# Patient Record
Sex: Female | Born: 1935
Health system: Southern US, Community
[De-identification: ages and names within clinical notes are randomized; demographics above are authoritative.]

## PROBLEM LIST (undated history)

## (undated) DIAGNOSIS — I10 Essential (primary) hypertension: Secondary | ICD-10-CM

## (undated) DIAGNOSIS — J9811 Atelectasis: Secondary | ICD-10-CM

## (undated) DIAGNOSIS — I5020 Unspecified systolic (congestive) heart failure: Secondary | ICD-10-CM

## (undated) DIAGNOSIS — Z923 Personal history of irradiation: Secondary | ICD-10-CM

## (undated) DIAGNOSIS — C50919 Malignant neoplasm of unspecified site of unspecified female breast: Secondary | ICD-10-CM

## (undated) DIAGNOSIS — E43 Unspecified severe protein-calorie malnutrition: Secondary | ICD-10-CM

## (undated) DIAGNOSIS — K449 Diaphragmatic hernia without obstruction or gangrene: Secondary | ICD-10-CM

## (undated) DIAGNOSIS — N189 Chronic kidney disease, unspecified: Secondary | ICD-10-CM

## (undated) DIAGNOSIS — K5790 Diverticulosis of intestine, part unspecified, without perforation or abscess without bleeding: Secondary | ICD-10-CM

## (undated) DIAGNOSIS — C9000 Multiple myeloma not having achieved remission: Secondary | ICD-10-CM

## (undated) DIAGNOSIS — N63 Unspecified lump in unspecified breast: Secondary | ICD-10-CM

## (undated) DIAGNOSIS — M1990 Unspecified osteoarthritis, unspecified site: Secondary | ICD-10-CM

## (undated) DIAGNOSIS — E785 Hyperlipidemia, unspecified: Secondary | ICD-10-CM

## (undated) HISTORY — DX: Unspecified lump in unspecified breast: N63.0

## (undated) HISTORY — DX: Diverticulosis of intestine, part unspecified, without perforation or abscess without bleeding: K57.90

## (undated) HISTORY — PX: TONSILLECTOMY: SUR1361

## (undated) HISTORY — DX: Malignant neoplasm of unspecified site of unspecified female breast: C50.919

## (undated) HISTORY — DX: Multiple myeloma not having achieved remission: C90.00

## (undated) HISTORY — DX: Hyperlipidemia, unspecified: E78.5

## (undated) HISTORY — DX: Essential (primary) hypertension: I10

## (undated) HISTORY — DX: Diaphragmatic hernia without obstruction or gangrene: K44.9

## (undated) HISTORY — DX: Chronic kidney disease, unspecified: N18.9

---

## 2009-12-11 ENCOUNTER — Emergency Department (HOSPITAL_COMMUNITY): Admission: EM | Admit: 2009-12-11 | Discharge: 2009-12-11 | Payer: Self-pay | Admitting: Emergency Medicine

## 2010-04-01 ENCOUNTER — Encounter: Payer: Self-pay | Admitting: Nurse Practitioner

## 2010-04-01 ENCOUNTER — Encounter: Admission: RE | Admit: 2010-04-01 | Discharge: 2010-04-01 | Payer: Self-pay | Admitting: Internal Medicine

## 2010-04-03 ENCOUNTER — Encounter: Payer: Self-pay | Admitting: Nurse Practitioner

## 2010-04-03 ENCOUNTER — Encounter (INDEPENDENT_AMBULATORY_CARE_PROVIDER_SITE_OTHER): Payer: Self-pay | Admitting: *Deleted

## 2010-04-03 ENCOUNTER — Encounter: Payer: Self-pay | Admitting: Physician Assistant

## 2010-04-03 ENCOUNTER — Telehealth: Payer: Self-pay | Admitting: Internal Medicine

## 2010-04-04 ENCOUNTER — Ambulatory Visit: Payer: Self-pay | Admitting: Gastroenterology

## 2010-04-04 DIAGNOSIS — E871 Hypo-osmolality and hyponatremia: Secondary | ICD-10-CM

## 2010-04-04 DIAGNOSIS — K5900 Constipation, unspecified: Secondary | ICD-10-CM | POA: Insufficient documentation

## 2010-04-04 DIAGNOSIS — R933 Abnormal findings on diagnostic imaging of other parts of digestive tract: Secondary | ICD-10-CM

## 2010-04-04 DIAGNOSIS — R1031 Right lower quadrant pain: Secondary | ICD-10-CM

## 2010-04-09 ENCOUNTER — Telehealth: Payer: Self-pay | Admitting: Nurse Practitioner

## 2010-05-15 ENCOUNTER — Telehealth (INDEPENDENT_AMBULATORY_CARE_PROVIDER_SITE_OTHER): Payer: Self-pay | Admitting: *Deleted

## 2010-11-18 NOTE — Letter (Signed)
Summary: [facility]  [facility]   Imported By: Sherian Rein 04/11/2010 12:01:19  _____________________________________________________________________  External Attachment:    Type:   Image     Comment:   External Document

## 2010-11-18 NOTE — Progress Notes (Signed)
Summary: New Pt/RUQ Abd Pain  Phone Note From Other Clinic   Caller: Lupita Leash @ Dr Perrin Maltese [phone] Call For: Dr Juanda Chance (Doc of the Day) Reason for Call: Schedule Patient Appt Summary of Call: Would like pt seen today or tomorrow instead of next available. RLQ Abd Pain Initial call taken by: Leanor Kail [facility],  April 03, 2010 1:43 PM  Follow-up for Phone Call        Per [name] pt scheduled to see Mike Gip on 04/04/10 at 1:30pm. Records to be faxed by Marcelino Duster at Dr. Ernestene Mention office and Marcelino Duster will notify pt of appt and cancellation policy. Follow-up by: Christie Nottingham CMA [facility]),  April 03, 2010 2:08 PM

## 2010-11-18 NOTE — Letter (Signed)
Summary: [facility]  [facility]   Imported By: Sherian Rein 04/11/2010 12:02:12  _____________________________________________________________________  External Attachment:    Type:   Image     Comment:   External Document

## 2010-11-18 NOTE — Assessment & Plan Note (Signed)
Summary: Abd pain/ per Paula/all   History of Present Illness Visit Type: consult Primary GI MD: Rob Bunting MD Primary Provider: Robert Bellow, MD Requesting Provider: Robert Bellow, MD Chief Complaint: severe lower abdominal pain, pt is also constipated History of Present Illness:   Jacqueline Rivas is a pleasant 77 year old female referred by [facility] (Dr. Perrin Maltese) for a one week history of nausea, RLQ pain / right lower back pain, and constipation.  She is hemoccult positive and has an abnormal CTscan abd / pelvis. Pain almost constant, worse when lying flat. Has to strain to defecate and having a small amount of rectal bleeding when she does so.  No fevers. Four years ago had same exact thing happen, states MRI was normal, given a laxative and problems resolved. In between these two episodes patient has felt fine. Her BMs usually normal. She has never had a colonoscopy. Doesn't like to take medications. Miralax recommended for acute constipation but she hasn't started it.   Labs / CTscan from Dr. Ernestene Mention office were reviewed. CBC normal. CMET pertinent only for Na of 126.  ESR 63. Given Cipro and Flagyl by Dr. Perrin Maltese - hasn't filled prescriptions yet.     GI Review of Systems     Location of  Abdominal pain: RLQ.    Denies abdominal pain, acid reflux, belching, bloating, chest pain, dysphagia with liquids, dysphagia with solids, heartburn, loss of appetite, nausea, vomiting, vomiting blood, weight loss, and  weight gain.      Reports change in bowel habits, constipation, and  rectal pain.     Denies anal fissure, black tarry stools, diarrhea, diverticulosis, fecal incontinence, heme positive stool, hemorrhoids, irritable bowel syndrome, jaundice, light color stool, liver problems, and  rectal bleeding. Preventive Screening-Counseling & Management  Alcohol-Tobacco     Smoking Status: never      Drug Use:  no.      Current Medications (verified): 1)  Miralax  Powd  (Polyethylene Glycol 3350) .... Take One Dose (17 Grams) Once Daily As Needed 2)  Senokot 8.6 Mg Tabs (Sennosides) .... As Needed 3)  Vitamin E 400 Unit Caps (Vitamin E) .... Take 1 Tablet By Mouth Once A Day 4)  Magnesium 300 Mg Caps (Magnesium) .... Take 1 Capsule By Mouth Once A Day 5)  Potassium 75 Mg Tabs (Potassium) .... Take 1 Tablet By Mouth Once A Day 6)  Cod Liver Oil 5000-500 Unit/55ml Oil (Cod Liver Oil) .... Take Dailly 7)  B-12 Dots 500 Mcg Subl (Cyanocobalamin) .... 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  Alcohol Use - yes 2-3/day Daily Caffeine Use 3/day Illicit Drug Use - no Smoking Status:  never Drug Use:  no  Review of Systems  The patient denies allergy/sinus, anemia, anxiety-new, arthritis/joint pain, back pain, blood in urine, breast changes/lumps, change in vision, confusion, cough, coughing up blood, depression-new, fainting, fatigue, fever, headaches-new, hearing problems, heart murmur, heart rhythm changes, itching, menstrual pain, muscle pains/cramps, night sweats, nosebleeds, pregnancy symptoms, shortness of breath, skin rash, sleeping problems, sore throat, swelling of feet/legs, swollen lymph glands, thirst - excessive , urination - excessive , urination changes/pain, urine leakage, vision changes, and voice  change.    Vital Signs:  Patient profile:   77 year old female Height:      66 inches Weight:      154 pounds BMI:     24.95 Pulse rate:   76 / minute Pulse rhythm:   regular BP sitting:   126 / 68  (left arm) Cuff size:   regular  Vitals Entered By: Jacqueline Rivas) (April 04, 2010  1:58 PM)  Physical Exam  General:  Well developed, well nourished, no acute distress. Head:  Normocephalic and atraumatic. Eyes:  Conjunctiva pink, no icterus.  Mouth:  No oral lesions. Tongue moist.  Neck:  no obvious masses  Lungs:  Clear throughout to auscultation. Heart:  Regular rate and rhythm; no murmurs, rubs,  or bruits. Abdomen:  Abdomen soft, nondistended, mild RLQ tenderness. No obvious masses or hepatomegaly.Normal bowel sounds.  Msk:  Symmetrical with no gross deformities. Normal posture. Extremities:  No palmar erythema, trace RLE edema.  Neurologic:  Alert and  oriented x4;  grossly normal neurologically. Skin:  Intact without significant lesions or rashes. Cervical Nodes:  No significant cervical adenopathy. Psych:  Alert and cooperative. Normal mood and affect.   Impression & Recommendations:  Problem # 1:  ABDOMINAL PAIN-RLQ (ICD-789.03) Assessment New Acute nausea, constipation, and RLQ pain with some radiation to right lower back in setting of CTscan revealing mild inflammation associated with the fat adjacent to the cecum and terminal ileum. Rule out early diverticulitis, ichemic colitis, Crohn's. CBC normal. ESR 63. CMET okay except sodium of 126.   For further evaluation in acute bowel habit change, RLQ pain, hemoccult positive stools and abnormal CTscan, patient will be scheduled for colonoscopy. I have advised her to take the Cipro and Flagyl prescribed by [facility]. The patient will be scheduled for a colonoscopy with biopsies/polypectomy (if indicated).  The risks and benefits of the procedure, as well as alternatives were discussed with the patient and she agrees to proceed.    Problem # 2:  NONSPECIFIC ABN FINDING RAD & OTH EXAM GI TRACT (ICD-793.4) Assessment: New See above.  Problem # 3:  CONSTIPATION (ICD-564.00) Assessment: New It has been nine days since her last bowel movement. Will purge bowels with Miralax.   Problem # 4:  HYPONATREMIA  (ICD-276.1) Na+ 126 on 04/01/10 labs. Her chloride was low at 89.  Etiology? Not on any medications.  Patient Instructions: 1)  Get Miralax at [facility].  One dose is 17 grams. 2)  Mix the powder with gatorade, 6-8 doses.  Drink a glass of this every 15-20 min.   3)  Take the medication Dr. Perrin Maltese prescribed, Cipro, Flagyl and the Vicodin. 4)  Drink clear liquids for the next 3 days and slowly advance to a regular diet as tolerated. 5)  Call us back as soon as you know when you can scheduled the Colonoscopy.   6)  Copy sent to : Robert Bellow, MD 7)  The medication list was reviewed and reconciled.  All changed / newly prescribed medications were explained.  A complete medication list was provided to the patient / caregiver.

## 2010-11-18 NOTE — Progress Notes (Signed)
Summary: Call pt to scheduled Colonoscopy  Phone Note Outgoing Call   Call placed by: Joselyn Glassman,  May 15, 2010 10:15 AM Call placed to: Patient Summary of Call: I left a message on the pt's home ans machine.  I reminded her that we suggested she schedule a Colonoscopy when she saw [name] NP in June , 2011.  She has not called [place] back to schedule.  I reminded her she would need to have someone with her for the entire time of the procedure.  I asked her to please call me. Initial call taken by: Joselyn Glassman,  May 15, 2010 10:18 AM

## 2010-11-18 NOTE — Letter (Signed)
Summary: New Patient letter  [facility]  [address] [place], [place] [address]   Phone: [phone]  Fax: [phone]       04/03/2010 MRN: [id number]  Jacqueline Rivas [address] [place], [place]  [address]  Dear Jacqueline Rivas,  Welcome to the Gastroenterology Division at [facility].    You are scheduled to see Dr.  Juanda Chance on 04-08-10 at 9:15am on the 3rd floor at [facility], [address]  We ask that you try to arrive at our office 15 minutes prior to your appointment time to allow for check-in.  We would like you to complete the enclosed self-administered evaluation form prior to your visit and bring it with you on the day of your appointment.  We will review it with you.  Also, please bring a complete list of all your medications or, if you prefer, bring the medication bottles and we will list them.  Please bring your insurance card so that we may make a copy of it.  If your insurance requires a referral to see a specialist, please bring your referral form from your primary care physician.  Co-payments are due at the time of your visit and may be paid by cash, check or credit card.     Your office visit will consist of a consult with your physician (includes a physical exam), any laboratory testing he/she may order, scheduling of any necessary diagnostic testing (e.g. x-ray, ultrasound, CT-scan), and scheduling of a procedure (e.g. Endoscopy, Colonoscopy) if required.  Please allow enough time on your schedule to allow for any/all of these possibilities.    If you cannot keep your appointment, please call [phone] to cancel or reschedule prior to your appointment date.  This allows [place] the opportunity to schedule an appointment for another patient in need of care.  If you do not cancel or reschedule by 5 p.m. the business day prior to your appointment date, you will be charged a $50.00 late cancellation/no-show fee.    Thank you for choosing  [facility] for your medical needs.  We appreciate the opportunity to care for you.  Please visit [place] at our website  to learn more about our practice.                     Sincerely,                                                             The Gastroenterology Division

## 2010-11-18 NOTE — Progress Notes (Signed)
Summary: Need to schedule Colonoscopy  Phone Note Outgoing Call   Call placed by: Joselyn Glassman,  April 09, 2010 8:58 AM Call placed to: Patient Summary of Call: Called pt to ask how she was doing. I wanted to know if she drank the MIralax and Gatorade to help purge her bowels.  I also asked if she spoke to her son about briniging her for theprocedure and reminded her she needs to scheduled ther Colonoscopy. I urged her to call me and advised that [name] NP , the extender she saw was asking me if I scheduled the Colonoscopy yet. Initial call taken by: Joselyn Glassman,  April 09, 2010 9:00 AM  Follow-up for Phone Call        LM again for the pt regarding an update as to whether she used the Miralax and Gatorade to purge her bowels.  Also wanted to scheduled the Colonoscopy with her.  I believe she did say she was going out of town this week. Follow-up by: Joselyn Glassman,  April 10, 2010 9:31 AM  Additional Follow-up for Phone Call Additional follow up Details #1::        The pt called and said she may schedule in Aug for the Colonoscopy but has to check her son's schedule.  She did 5 doses of Miralax with Gatorade on Tues PM and Wed she had 3-4 BM's with lots of liquid stool.  She has taken it since in the AM and PM. She is having loose, liquid stools 2 times daily with lots of gas.  No urgency but lots of gas.  SHe is still having some abd discomfort.  She gave me a list of things she is eating right now.  I will discuss with [name] NP today and call the pt back later today. Additional Follow-up by: Joselyn Glassman,  April 10, 2010 10:51 AM    Additional Follow-up for Phone Call Additional follow up Details #2::    Spoke to pt and advised her since she did not have the antibiotic prescriptions filled she should get them. They should help the pain and inflammation.  [name] said the items she has been eating are fine. I told her to call me tomorrow and that I will be on  vacation and she should call us if she needs [place] next week. Follow-up by: Joselyn Glassman,  April 10, 2010 4:02 PM

## 2012-11-11 ENCOUNTER — Other Ambulatory Visit: Payer: Self-pay | Admitting: Internal Medicine

## 2012-11-11 ENCOUNTER — Ambulatory Visit
Admission: RE | Admit: 2012-11-11 | Discharge: 2012-11-11 | Disposition: A | Discharge status: Home / Self Care | Payer: Medicare Other | Source: Ambulatory Visit | Attending: Internal Medicine | Admitting: Internal Medicine

## 2012-11-11 ENCOUNTER — Ambulatory Visit (INDEPENDENT_AMBULATORY_CARE_PROVIDER_SITE_OTHER): Payer: Medicare Other | Admitting: Internal Medicine

## 2012-11-11 ENCOUNTER — Ambulatory Visit: Payer: Medicare Other

## 2012-11-11 VITALS — BP 162/79 | HR 94 | Temp 97.8°F | Resp 17 | Ht 65.0 in | Wt 155.0 lb

## 2012-11-11 DIAGNOSIS — M5137 Other intervertebral disc degeneration, lumbosacral region: Secondary | ICD-10-CM

## 2012-11-11 DIAGNOSIS — R700 Elevated erythrocyte sedimentation rate: Secondary | ICD-10-CM

## 2012-11-11 DIAGNOSIS — E871 Hypo-osmolality and hyponatremia: Secondary | ICD-10-CM

## 2012-11-11 DIAGNOSIS — K5730 Diverticulosis of large intestine without perforation or abscess without bleeding: Secondary | ICD-10-CM | POA: Diagnosis not present

## 2012-11-11 DIAGNOSIS — K5900 Constipation, unspecified: Secondary | ICD-10-CM

## 2012-11-11 DIAGNOSIS — R109 Unspecified abdominal pain: Secondary | ICD-10-CM

## 2012-11-11 DIAGNOSIS — N63 Unspecified lump in unspecified breast: Secondary | ICD-10-CM

## 2012-11-11 DIAGNOSIS — N390 Urinary tract infection, site not specified: Secondary | ICD-10-CM | POA: Diagnosis not present

## 2012-11-11 DIAGNOSIS — B029 Zoster without complications: Secondary | ICD-10-CM

## 2012-11-11 DIAGNOSIS — M545 Low back pain, unspecified: Secondary | ICD-10-CM

## 2012-11-11 DIAGNOSIS — K921 Melena: Secondary | ICD-10-CM

## 2012-11-11 DIAGNOSIS — N6310 Unspecified lump in the right breast, unspecified quadrant: Secondary | ICD-10-CM

## 2012-11-11 DIAGNOSIS — K449 Diaphragmatic hernia without obstruction or gangrene: Secondary | ICD-10-CM | POA: Diagnosis not present

## 2012-11-11 DIAGNOSIS — M51379 Other intervertebral disc degeneration, lumbosacral region without mention of lumbar back pain or lower extremity pain: Secondary | ICD-10-CM

## 2012-11-11 DIAGNOSIS — M5136 Other intervertebral disc degeneration, lumbar region: Secondary | ICD-10-CM

## 2012-11-11 LAB — POCT URINALYSIS DIPSTICK
Glucose, UA: NEGATIVE
Nitrite, UA: NEGATIVE
Protein, UA: 30
Spec Grav, UA: >=1.03
Urobilinogen, UA: 0.2

## 2012-11-11 LAB — POCT UA - MICROSCOPIC ONLY
Casts, Ur, LPF, POC: NEGATIVE
Crystals, Ur, HPF, POC: NEGATIVE
Mucus, UA: POSITIVE

## 2012-11-11 LAB — TSH: TSH: 3.264 u[IU]/mL (ref 0.350–4.500)

## 2012-11-11 LAB — POCT SEDIMENTATION RATE: POCT SED RATE: 114 mm/hr — AB (ref 0–22)

## 2012-11-11 LAB — POCT CBC
HCT, POC: 41.6 % (ref 37.7–47.9)
Hemoglobin: 13.5 g/dL (ref 12.2–16.2)
Lymph, poc: 1.9 (ref 0.6–3.4)
MID (cbc): 0.9 (ref 0–0.9)
MPV: 7.5 fL (ref 0–99.8)
POC Granulocyte: 5.2 (ref 2–6.9)
POC MID %: 11.3 %M (ref 0–12)
WBC: 8 10*3/uL (ref 4.6–10.2)

## 2012-11-11 LAB — COMPREHENSIVE METABOLIC PANEL
ALT: 20 U/L (ref 0–35)
AST: 21 U/L (ref 0–37)
Albumin: 3.9 g/dL (ref 3.5–5.2)
CO2: 23 mEq/L (ref 19–32)
Calcium: 11.3 mg/dL — ABNORMAL HIGH (ref 8.4–10.5)
Sodium: 128 mEq/L — ABNORMAL LOW (ref 135–145)
Total Protein: 9.7 g/dL — ABNORMAL HIGH (ref 6.0–8.3)

## 2012-11-11 MED ORDER — CEFTRIAXONE SODIUM 1 G IJ SOLR: 1.0000 g | Freq: Once | INTRAMUSCULAR | Status: DC

## 2012-11-11 MED ORDER — CIPROFLOXACIN HCL 500 MG PO TABS: 500.0000 mg | ORAL_TABLET | Freq: Two times a day (BID) | ORAL | Status: DC

## 2012-11-11 MED ORDER — HYDROCODONE-ACETAMINOPHEN 5-325 MG PO TABS: 1.0000 | ORAL_TABLET | Freq: Four times a day (QID) | ORAL | Status: DC | PRN

## 2012-11-11 MED ORDER — VALACYCLOVIR HCL 1 G PO TABS: 1000.0000 mg | ORAL_TABLET | Freq: Three times a day (TID) | ORAL | Status: DC

## 2012-11-11 MED ORDER — IOHEXOL 300 MG/ML  SOLN
100.0000 mL | Freq: Once | INTRAMUSCULAR | Status: AC | PRN
  Administered 2012-11-11: 100 mL via INTRAVENOUS

## 2012-11-11 NOTE — Progress Notes (Signed)
Patient needs CT abdomen and pelvis with contrast she can have this done at 315 today, she is to go to [facility] [address] for this.  BUN and Creat are pending at the lab, Denny Peon called and we will get STAT results on these. Patient is given contrast and instructed to drink 1st bottle at 1:30 2nd at 2:30.

## 2012-11-11 NOTE — Patient Instructions (Addendum)
Constipation, Adult Constipation is when a person has fewer than 3 bowel movements a week; has difficulty having a bowel movement; or has stools that are dry, hard, or larger than normal. As people grow older, constipation is more common. If you try to fix constipation with medicines that make you have a bowel movement (laxatives), the problem may get worse. Long-term laxative use may cause the muscles of the colon to become weak. A low-fiber diet, not taking in enough fluids, and taking certain medicines may make constipation worse. CAUSES   Certain medicines, such as antidepressants, pain medicine, iron supplements, antacids, and water pills.   Certain diseases, such as diabetes, irritable bowel syndrome (IBS), thyroid disease, or depression.   Not drinking enough water.   Not eating enough fiber-rich foods.   Stress or travel.  Lack of physical activity or exercise.  Not going to the restroom when there is the urge to have a bowel movement.  Ignoring the urge to have a bowel movement.  Using laxatives too much. SYMPTOMS   Having fewer than 3 bowel movements a week.   Straining to have a bowel movement.   Having hard, dry, or larger than normal stools.   Feeling full or bloated.   Pain in the lower abdomen.  Not feeling relief after having a bowel movement. DIAGNOSIS  Your caregiver will take a medical history and perform a physical exam. Further testing may be done for severe constipation. Some tests may include:   A barium enema X-ray to examine your rectum, colon, and sometimes, your small intestine.  A sigmoidoscopy to examine your lower colon.  A colonoscopy to examine your entire colon. TREATMENT  Treatment will depend on the severity of your constipation and what is causing it. Some dietary treatments include drinking more fluids and eating more fiber-rich foods. Lifestyle treatments may include regular exercise. If these diet and lifestyle recommendations  do not help, your caregiver may recommend taking over-the-counter laxative medicines to help you have bowel movements. Prescription medicines may be prescribed if over-the-counter medicines do not work.  HOME CARE INSTRUCTIONS   Increase dietary fiber in your diet, such as fruits, vegetables, whole grains, and beans. Limit high-fat and processed sugars in your diet, such as [place] fries, hamburgers, cookies, candies, and soda.   A fiber supplement may be added to your diet if you cannot get enough fiber from foods.   Drink enough fluids to keep your urine clear or pale yellow.   Exercise regularly or as directed by your caregiver.   Go to the restroom when you have the urge to go. Do not hold it.  Only take medicines as directed by your caregiver. Do not take other medicines for constipation without talking to your caregiver first. SEEK IMMEDIATE MEDICAL CARE IF:   You have bright red blood in your stool.   Your constipation lasts for more than 4 days or gets worse.   You have abdominal or rectal pain.   You have thin, pencil-like stools.  You have unexplained weight loss. MAKE SURE YOU:   Understand these instructions.  Will watch your condition.  Will get help right away if you are not doing well or get worse. Document Released: 07/03/2004 Document Revised: 12/28/2011 Document Reviewed: 09/08/2011 [facility] Patient Information 2013 [place], [place]. Urinary Tract Infection Urinary tract infections (UTIs) can develop anywhere along your urinary tract. Your urinary tract is your body's drainage system for removing wastes and extra water. Your urinary tract includes two kidneys, two ureters,  a bladder, and a urethra. Your kidneys are a pair of bean-shaped organs. Each kidney is about the size of your fist. They are located below your ribs, one on each side of your spine. CAUSES Infections are caused by microbes, which are microscopic organisms, including fungi, viruses, and  bacteria. These organisms are so small that they can only be seen through a microscope. Bacteria are the microbes that most commonly cause UTIs. SYMPTOMS  Symptoms of UTIs may vary by age and gender of the patient and by the location of the infection. Symptoms in young women typically include a frequent and intense urge to urinate and a painful, burning feeling in the bladder or urethra during urination. Older women and men are more likely to be tired, shaky, and weak and have muscle aches and abdominal pain. A fever may mean the infection is in your kidneys. Other symptoms of a kidney infection include pain in your back or sides below the ribs, nausea, and vomiting. DIAGNOSIS To diagnose a UTI, your caregiver will ask you about your symptoms. Your caregiver also will ask to provide a urine sample. The urine sample will be tested for bacteria and white blood cells. White blood cells are made by your body to help fight infection. TREATMENT  Typically, UTIs can be treated with medication. Because most UTIs are caused by a bacterial infection, they usually can be treated with the use of antibiotics. The choice of antibiotic and length of treatment depend on your symptoms and the type of bacteria causing your infection. HOME CARE INSTRUCTIONS  If you were prescribed antibiotics, take them exactly as your caregiver instructs you. Finish the medication even if you feel better after you have only taken some of the medication.  Drink enough water and fluids to keep your urine clear or pale yellow.  Avoid caffeine, tea, and carbonated beverages. They tend to irritate your bladder.  Empty your bladder often. Avoid holding urine for long periods of time.  Empty your bladder before and after sexual intercourse.  After a bowel movement, women should cleanse from front to back. Use each tissue only once. SEEK MEDICAL CARE IF:   You have back pain.  You develop a fever.  Your symptoms do not begin to  resolve within 3 days. SEEK IMMEDIATE MEDICAL CARE IF:   You have severe back pain or lower abdominal pain.  You develop chills.  You have nausea or vomiting.  You have continued burning or discomfort with urination. MAKE SURE YOU:   Understand these instructions.  Will watch your condition.  Will get help right away if you are not doing well or get worse. Document Released: 07/15/2005 Document Revised: 04/05/2012 Document Reviewed: 11/13/2011 [facility] Patient Information 2013 [place], [place]. Shingles Shingles is caused by the same virus that causes chickenpox (varicella zoster virus or VZV). Shingles often occurs many years or decades after having chickenpox. That is why it is more common in adults older than 50 years. The virus reactivates and breaks out as an infection in a nerve root. SYMPTOMS   The initial feeling (sensations) may be pain. This pain is usually described as:  Burning.  Stabbing.  Throbbing.  Tingling in the nerve root.  A red rash will follow in a couple days. The rash may occur in any area of the body and is usually on one side (unilateral) of the body in a band or belt-like pattern. The rash usually starts out as very small blisters (vesicles). They will dry up after  7 to 10 days. This is not usually a significant problem except for the pain it causes.  Long-lasting (chronic) pain is more likely in an elderly person. It can last months to years. This condition is called postherpetic neuralgia. Shingles can be an extremely severe infection in someone with AIDS, a weakened immune system, or with forms of leukemia. It can also be severe if you are taking transplant medicines or other medicines that weaken the immune system. TREATMENT  Your caregiver will often treat you with:  Antiviral drugs.  Anti-inflammatory drugs.  Pain medicines. Bed rest is very important in preventing the pain associated with herpes zoster (postherpetic neuralgia). Application  of heat in the form of a hot water bottle or electric heating pad or gentle pressure with the hand is recommended to help with the pain or discomfort. PREVENTION  A varicella zoster vaccine is available to help protect against the virus. The Food and Drug Administration approved the varicella zoster vaccine for individuals 8 years of age and older. HOME CARE INSTRUCTIONS   Cool compresses to the area of rash may be helpful.  Only take over-the-counter or prescription medicines for pain, discomfort, or fever as directed by your caregiver.  Avoid contact with:  Babies.  Pregnant women.  Children with eczema.  Elderly people with transplants.  People with chronic illnesses, such as leukemia and AIDS.  If the area involved is on your face, you may receive a referral for follow-up to a specialist. It is very important to keep all follow-up appointments. This will help avoid eye complications, chronic pain, or disability. SEEK IMMEDIATE MEDICAL CARE IF:   You develop any pain (headache) in the area of the face or eye. This must be followed carefully by your caregiver or ophthalmologist. An infection in part of your eye (cornea) can be very serious. It could lead to blindness.  You do not have pain relief from prescribed medicines.  Your redness or swelling spreads.  The area involved becomes very swollen and painful.  You have a fever.  You notice any red or painful lines extending away from the affected area toward your heart (lymphangitis).  Your condition is worsening or has changed. Document Released: 10/05/2005 Document Revised: 12/28/2011 Document Reviewed: 09/09/2009 [facility] Patient Information 2013 [place], [place].

## 2012-11-11 NOTE — Progress Notes (Signed)
Subjective:    Patient ID: Jacqueline Rivas, female    DOB: 04-Dec-1931, 79 y.o.   MRN: [id number]  HPI Has over 1 month of abdominal pain and low back pain and constipation. Has anorexia with no weight loss, no fever, urinary sxs. Fatigue and aching all over. In 2011 had same sxs and abnormal ct of abdomen. Saw [facility] but did not do colonoscopy as ordered. Also had low sodium and sed rate of 73 2011.   Review of Systems New breast lump   Objective:   Physical Exam  Vitals reviewed. Constitutional: She is oriented to person, place, and time. She appears well-developed and well-nourished. No distress.  HENT:  Right Ear: External ear normal.  Left Ear: External ear normal.  Nose: Nose normal.  Mouth/Throat: Oropharynx is clear and moist.  Eyes: EOM are normal. No scleral icterus.  Neck: Normal range of motion. No tracheal deviation present. No thyromegaly present.  Cardiovascular: Normal rate and normal heart sounds.   Pulmonary/Chest: Effort normal and breath sounds normal. She exhibits mass. She exhibits no tenderness and no edema. Right breast exhibits mass. Right breast exhibits no inverted nipple, no nipple discharge, no skin change and no tenderness. Left breast exhibits no inverted nipple, no mass, no nipple discharge, no skin change and no tenderness. Breasts are symmetrical.         Mobile irregular mass as shown   Abdominal: Soft. Bowel sounds are normal. She exhibits no distension and no mass. There is tenderness. There is no rebound and no guarding.  Genitourinary: There is breast swelling. No breast tenderness, discharge or bleeding.  Musculoskeletal: She exhibits tenderness.  Lymphadenopathy:    She has no cervical adenopathy.  Neurological: She is alert and oriented to person, place, and time. She exhibits normal muscle tone. Coordination normal.  Skin: Skin is warm and dry. Rash noted.          Healing vesicular plaques  Psychiatric: She has a normal mood  and affect.    [facility] reading (PRIMARY) by  Dr.Guest severe DDD, spondylosis, chronic changes right heart border, fos no obstruction  Results for orders placed in visit on 11/11/12  POCT CBC      Component Value Range   WBC 8.0  4.6 - 10.2 K/uL   Lymph, poc 1.9  0.6 - 3.4   POC LYMPH PERCENT 23.8  10 - 50 %L   MID (cbc) 0.9  0 - 0.9   POC MID % 11.3  0 - 12 %M   POC Granulocyte 5.2  2 - 6.9   Granulocyte percent 64.9  37 - 80 %G   RBC 4.16  4.04 - 5.48 M/uL   Hemoglobin 13.5  12.2 - 16.2 g/dL   HCT, POC 10.2  72.5 - 47.9 %   MCV 100.0 (*) 80 - 97 fL   MCH, POC 32.5 (*) 27 - 31.2 pg   MCHC 32.5  31.8 - 35.4 g/dL   RDW, POC 36.6     Platelet Count, POC 347  142 - 424 K/uL   MPV 7.5  0 - 99.8 fL  POCT UA - MICROSCOPIC ONLY      Component Value Range   WBC, Ur, HPF, POC 18-20     RBC, urine, microscopic 6-8     Bacteria, U Microscopic 1+     Mucus, UA positive     Epithelial cells, urine per micros 0-1     Crystals, Ur, HPF, POC neg  Casts, Ur, LPF, POC neg     Yeast, UA neg    POCT URINALYSIS DIPSTICK      Component Value Range   Color, UA yellow     Clarity, UA clear     Glucose, UA neg     Bilirubin, UA small     Ketones, UA 15     Spec Grav, UA >=1.030     Blood, UA trace-intact     pH, UA 5.5     Protein, UA 30     Urobilinogen, UA 0.2     Nitrite, UA neg     Leukocytes, UA small (1+)     C and S urine      Assessment & Plan:  UTI Constipation Sed rate over 100 Abdominal pain Severe DDD/back pain Large right breast mass  Shingles right lb and down right leg Schedule dx mammogram/CT abd and pelvis/GI consult  RTC Sunday

## 2012-11-13 ENCOUNTER — Ambulatory Visit (INDEPENDENT_AMBULATORY_CARE_PROVIDER_SITE_OTHER): Payer: Medicare Other | Admitting: Internal Medicine

## 2012-11-13 ENCOUNTER — Other Ambulatory Visit: Payer: Self-pay | Admitting: Internal Medicine

## 2012-11-13 ENCOUNTER — Encounter: Payer: Self-pay | Admitting: Radiology

## 2012-11-13 DIAGNOSIS — N63 Unspecified lump in unspecified breast: Secondary | ICD-10-CM

## 2012-11-13 DIAGNOSIS — R0789 Other chest pain: Secondary | ICD-10-CM

## 2012-11-13 DIAGNOSIS — D472 Monoclonal gammopathy: Secondary | ICD-10-CM

## 2012-11-13 DIAGNOSIS — N390 Urinary tract infection, site not specified: Secondary | ICD-10-CM

## 2012-11-13 DIAGNOSIS — B029 Zoster without complications: Secondary | ICD-10-CM

## 2012-11-13 DIAGNOSIS — D4860 Neoplasm of uncertain behavior of unspecified breast: Secondary | ICD-10-CM | POA: Diagnosis not present

## 2012-11-13 DIAGNOSIS — R778 Other specified abnormalities of plasma proteins: Secondary | ICD-10-CM

## 2012-11-13 LAB — URINE CULTURE

## 2012-11-13 MED ORDER — CYANOCOBALAMIN 1000 MCG/ML IJ SOLN
1000.0000 ug | Freq: Once | INTRAMUSCULAR | Status: AC
  Administered 2012-11-13: 1000 ug via INTRAMUSCULAR

## 2012-11-13 MED ORDER — KETOROLAC TROMETHAMINE 60 MG/2ML IM SOLN
60.0000 mg | Freq: Once | INTRAMUSCULAR | Status: AC
  Administered 2012-11-13: 60 mg via INTRAMUSCULAR

## 2012-11-13 NOTE — Progress Notes (Signed)
  Subjective:    Patient ID: Jacqueline Rivas, female    DOB: August 31, 1932, 79 y.o.   MRN: [id number]  HPI Right and left lower rib pain is worst sx today. Has elevated calcium, total protein, very high sed rate. SPE is pending. PTH/calcium ordered today Abdominal pain, uti, and shingles are better today. Large breast mass on right referred for dx mammogram Low sodium and chloride suggestive of IADH.   Review of Systems     Objective:   Physical Exam  Vitals reviewed. Constitutional: She is oriented to person, place, and time. She appears well-developed and well-nourished. She appears distressed.  Eyes: EOM are normal. No scleral icterus.  Neck: Normal range of motion.  Cardiovascular: Normal rate and normal heart sounds.   Pulmonary/Chest: Effort normal and breath sounds normal. She exhibits tenderness.  Abdominal: She exhibits no mass. There is tenderness. There is no rebound and no guarding.  Musculoskeletal: She exhibits tenderness.       Arms:      Painful to palpate and to use these muscles  Neurological: She is alert and oriented to person, place, and time. She exhibits normal muscle tone. Coordination abnormal.  Skin: Rash noted.          shingles  Psychiatric: She has a normal mood and affect. Her behavior is normal.    PTH/calcim serum      Assessment & Plan:  Will schedule for chest ct next week If worsens hospitalize/consider oncology consult soon Use pain meds. Diagnostic mammogram next week

## 2012-11-14 LAB — PTH, INTACT AND CALCIUM: Calcium, Total (PTH): 11.1 mg/dL — ABNORMAL HIGH (ref 8.4–10.5)

## 2012-11-15 ENCOUNTER — Telehealth: Payer: Self-pay | Admitting: Radiology

## 2012-11-15 ENCOUNTER — Ambulatory Visit
Admission: RE | Admit: 2012-11-15 | Discharge: 2012-11-15 | Disposition: A | Discharge status: Home / Self Care | Payer: Medicare Other | Source: Ambulatory Visit | Attending: Internal Medicine | Admitting: Internal Medicine

## 2012-11-15 DIAGNOSIS — S22000A Wedge compression fracture of unspecified thoracic vertebra, initial encounter for closed fracture: Secondary | ICD-10-CM

## 2012-11-15 DIAGNOSIS — K449 Diaphragmatic hernia without obstruction or gangrene: Secondary | ICD-10-CM | POA: Diagnosis not present

## 2012-11-15 DIAGNOSIS — N63 Unspecified lump in unspecified breast: Secondary | ICD-10-CM

## 2012-11-15 DIAGNOSIS — R0789 Other chest pain: Secondary | ICD-10-CM

## 2012-11-15 DIAGNOSIS — J9819 Other pulmonary collapse: Secondary | ICD-10-CM | POA: Diagnosis not present

## 2012-11-15 LAB — PROTEIN ELECTROPHORESIS, SERUM
M-Spike, %: 3.35 g/dL
Total Protein, Serum Electrophoresis: 9.8 g/dL — ABNORMAL HIGH (ref 6.0–8.3)

## 2012-11-15 MED ORDER — IOHEXOL 300 MG/ML  SOLN
75.0000 mL | Freq: Once | INTRAMUSCULAR | Status: AC | PRN
  Administered 2012-11-15: 75 mL via INTRAVENOUS

## 2012-11-15 NOTE — Telephone Encounter (Signed)
Patients CT scan is in system patient has already gone home, it was ordered STAT, can you review and let me know what to advise patient? She has already gone home. Jacqueline Rivas

## 2012-11-15 NOTE — Telephone Encounter (Signed)
"  There is a nodular mass in the right breast measures 3.8 x 2.2 cm. Further evaluation with mammogram is recommended". - Looks like this has already been ordered  Atelectasis vs infiltrate in left lung base - sounds like she is asymptomatic though?  No cough, fever, SOB documented  T5, T7, T12 with compression fx of indeterminate age  Looks like Dr. Perrin Maltese considered referral to onc, why don't we go ahead and put this in.  Encourage pt to follow through with mammogram that has been ordered.  If any symptoms worsen RTC or proceed to ED

## 2012-11-15 NOTE — Telephone Encounter (Signed)
Also serum protein electophoresis was done, this is being faxed.

## 2012-11-16 ENCOUNTER — Telehealth: Payer: Self-pay

## 2012-11-16 NOTE — Telephone Encounter (Signed)
Called [facility] and spoke with [place], added IFE per Dr. Perrin Maltese.

## 2012-11-16 NOTE — Telephone Encounter (Signed)
Oncology referral made, called patient to advise on results of scan and to encourage her to go for mammogram. Left message for her to call me back.

## 2012-11-18 LAB — IFE INTERPRETATION

## 2012-11-19 NOTE — Progress Notes (Signed)
Phone call to Jacqueline Rivas today. Told her about the new compression fractures in her Tspine and about the probability of MM, bone cancer. She is having lots of pain from her fxs. Doing urgent referrals to Heme-onc and GI for colonoscopy. RF pain meds when she needs them.

## 2012-11-19 NOTE — Addendum Note (Signed)
Addended by: Jonita Albee on: 11/19/2012 04:22 PM   Modules accepted: Orders

## 2012-11-21 ENCOUNTER — Telehealth: Payer: Self-pay | Admitting: Radiology

## 2012-11-21 DIAGNOSIS — N63 Unspecified lump in unspecified breast: Secondary | ICD-10-CM

## 2012-11-21 NOTE — Telephone Encounter (Signed)
Order placed

## 2012-11-21 NOTE — Telephone Encounter (Signed)
Message copied by Caffie Damme on Mon Nov 21, 2012 11:54 AM ------      Message from: Sharion Dove      Created: Fri Nov 18, 2012  3:24 PM      Regarding: EPIC ORDER        We will also need an epic order for an ultrasound right breast for lump. Thanks!

## 2012-11-21 NOTE — Telephone Encounter (Signed)
Mammogram not done yet. Order placed for this to be done, she is aware of oncology appt and spinal fractures. She also complains of constipation. Has miralax and magnesium citrate to use, will advise if these are not helpful.

## 2012-11-22 ENCOUNTER — Telehealth: Payer: Self-pay | Admitting: Radiology

## 2012-11-22 ENCOUNTER — Telehealth: Payer: Self-pay | Admitting: *Deleted

## 2012-11-22 NOTE — Telephone Encounter (Signed)
Called referring to let them know that the patient needs to be sent to [facility] for further exams and a biopsy needs to be done first and they will refer her to [place] if needed.

## 2012-11-22 NOTE — Telephone Encounter (Signed)
Spoke to cancer center, they were questioning referral. Spoke to lisa, she states patient must have the mammogram first. I have advised we are concerned about her thoracic fractures and abnormal labs, she will have Tiffany call me back. Tiffany # is [phone]. FYI

## 2012-11-24 ENCOUNTER — Ambulatory Visit (INDEPENDENT_AMBULATORY_CARE_PROVIDER_SITE_OTHER): Payer: Medicare Other | Admitting: Internal Medicine

## 2012-11-24 ENCOUNTER — Other Ambulatory Visit: Payer: Self-pay | Admitting: Hematology & Oncology

## 2012-11-24 ENCOUNTER — Encounter: Payer: Self-pay | Admitting: *Deleted

## 2012-11-24 ENCOUNTER — Telehealth: Payer: Self-pay | Admitting: Hematology & Oncology

## 2012-11-24 DIAGNOSIS — N390 Urinary tract infection, site not specified: Secondary | ICD-10-CM | POA: Diagnosis not present

## 2012-11-24 DIAGNOSIS — N63 Unspecified lump in unspecified breast: Secondary | ICD-10-CM | POA: Diagnosis not present

## 2012-11-24 DIAGNOSIS — E871 Hypo-osmolality and hyponatremia: Secondary | ICD-10-CM | POA: Diagnosis not present

## 2012-11-24 DIAGNOSIS — S22000A Wedge compression fracture of unspecified thoracic vertebra, initial encounter for closed fracture: Secondary | ICD-10-CM

## 2012-11-24 DIAGNOSIS — B029 Zoster without complications: Secondary | ICD-10-CM

## 2012-11-24 DIAGNOSIS — Z23 Encounter for immunization: Secondary | ICD-10-CM

## 2012-11-24 DIAGNOSIS — S22009A Unspecified fracture of unspecified thoracic vertebra, initial encounter for closed fracture: Secondary | ICD-10-CM

## 2012-11-24 DIAGNOSIS — D472 Monoclonal gammopathy: Secondary | ICD-10-CM

## 2012-11-24 DIAGNOSIS — C9000 Multiple myeloma not having achieved remission: Secondary | ICD-10-CM

## 2012-11-24 LAB — POCT CBC
Granulocyte percent: 67.2 %G (ref 37–80)
HCT, POC: 36.4 % — AB (ref 37.7–47.9)
MCH, POC: 32 pg — AB (ref 27–31.2)
MCV: 99.5 fL — AB (ref 80–97)
POC Granulocyte: 5.4 (ref 2–6.9)
Platelet Count, POC: 352 10*3/uL (ref 142–424)
RBC: 3.66 M/uL — AB (ref 4.04–5.48)
RDW, POC: 13.7 %
WBC: 8.1 10*3/uL (ref 4.6–10.2)

## 2012-11-24 LAB — COMPREHENSIVE METABOLIC PANEL
AST: 33 U/L (ref 0–37)
Albumin: 3.6 g/dL (ref 3.5–5.2)
Calcium: 11.6 mg/dL — ABNORMAL HIGH (ref 8.4–10.5)
Chloride: 89 mEq/L — ABNORMAL LOW (ref 96–112)
Potassium: 3.8 mEq/L (ref 3.5–5.3)
Sodium: 128 mEq/L — ABNORMAL LOW (ref 135–145)
Total Protein: 9.4 g/dL — ABNORMAL HIGH (ref 6.0–8.3)

## 2012-11-24 LAB — POCT UA - MICROSCOPIC ONLY
Casts, Ur, LPF, POC: NEGATIVE
Crystals, Ur, HPF, POC: NEGATIVE
Mucus, UA: NEGATIVE
RBC, urine, microscopic: 0
Yeast, UA: NEGATIVE

## 2012-11-24 LAB — POCT URINALYSIS DIPSTICK
Bilirubin, UA: NEGATIVE
Blood, UA: NEGATIVE
Nitrite, UA: NEGATIVE
Spec Grav, UA: 1.02
Urobilinogen, UA: 0.2

## 2012-11-24 MED ORDER — VALACYCLOVIR HCL 1 G PO TABS: 1000.0000 mg | ORAL_TABLET | Freq: Three times a day (TID) | ORAL | Status: DC

## 2012-11-24 MED ORDER — PNEUMOCOCCAL VAC POLYVALENT 25 MCG/0.5ML IJ INJ: 0.5000 mL | INJECTION | INTRAMUSCULAR | Status: DC

## 2012-11-24 MED ORDER — CIPROFLOXACIN HCL 500 MG PO TABS: 500.0000 mg | ORAL_TABLET | Freq: Two times a day (BID) | ORAL | Status: DC

## 2012-11-24 MED ORDER — HYDROCODONE-ACETAMINOPHEN 5-325 MG PO TABS: 1.0000 | ORAL_TABLET | Freq: Four times a day (QID) | ORAL | Status: DC | PRN

## 2012-11-24 NOTE — Telephone Encounter (Signed)
Pt aware of 2-7 appointments. Ari aware to precert zometa

## 2012-11-24 NOTE — Patient Instructions (Signed)
Multiple Myeloma Multiple myeloma is the most common cancer of bone. It is caused by the uncontrolled multiplication of a type of white blood cell in the marrow. This white blood cell is called a plasma cell. This means the bone marrow is overworking producing plasma cells. Soon these overproduced cells begin to take up room in the marrow that is needed by other cells. This means that there are soon not enough red or white blood cells or platelets. Not enough red cells mean that the person is anemic. There are not enough red blood cells to carry oxygen around the body. There are not enough white blood cells to fight disease. This causes the person with multiple myeloma to not feel well. There is also bone pain through much of the body. SYMPTOMS  Anemia causes fatigue (tiredness) and weakness.  Back pain is common. This is from fractures (break in bones) caused by damage to the bones of the back.  Lack of white blood cells makes infection more likely.  Bleeding is a common problem from lack of the cells (platelets). Platelets help blood clots form. This may show up as bleeding from any place. Commonly this shows up as bleeding from the nose or gums.  Fractures (bone breaks) are more common anywhere. The back and ribs are the most commonly fractured areas. DIAGNOSIS  This tumor is often suggested by blood tests. Often doing a bone marrow sample makes the diagnosis (learning what is wrong). This is a test performed by taking a small sample of bone with a small needle. This bone often comes from the sternum (breast bone). This sample is sent to a pathologist (a specialist in looking at tissue under a microscope). After looking at the sample under the microscope, the pathologist is able to make a diagnosis of the problem. X-rays may also show boney changes. TREATMENT   Occasionally, anti-cancer medications may be used with multiple myeloma. Your caregiver can discuss this with you.  Medications can  also be given to help with the bone pain.  There is no cure for multiple myeloma. Lifestyle changes can add years of quality living. HOME CARE INSTRUCTIONS  Often there is no specific treatment for multiple myeloma. Most of the treatment consists of adjustments in dietary and living activities. Some of these changes include:  Your dietitian or caregiver helping you with your dietary questions.  Taking iron and vitamins as prescribed by your caregiver.  Eating a well balanced diet.  Staying active, but follow restrictions suggested by your caregiver. Avoiding heavy lifting (more than 10 pounds) and activities that cause increased pain.  Drinking plenty of water.  Using back braces and a cane may help with some of the boney pain. SEEK IMMEDIATE MEDICAL CARE IF:  You develop severe, uncontrolled boney pain.  You or your family notices confusion, problems with decision-making or inability to stay awake.  You notice increased urination or constipation.  You notice problems holding your water or stool.  You have numbness or loss of control of your extremities (arms/hands or legs/feet). Document Released: 06/30/2001 Document Revised: 12/28/2011 Document Reviewed: 09/30/2008 [facility] Patient Information 2013 [place], [place].

## 2012-11-24 NOTE — Progress Notes (Signed)
Subjective:    Patient ID: Jacqueline Rivas, female    DOB: [birth year], 79 y.o.   MRN: [id number]  HPI Has monoclonal gammopathy, hypercalcemia, sed rate over 110, multiple compression fxs some recent, back pain, bone pain Abdominal pain, difficulty urinating and pooping, active shingles, low sodium and chloride. She has a large breast mass cause unclear. We have had difficulty referring her to hem/onc, I have put a call personally to Dr. Drue Dun and he will work her in this week. The staff we called multiple times would not make the appt.   Review of Systems     Objective:   Physical Exam  Vitals reviewed. Constitutional: She is oriented to person, place, and time. She appears well-nourished. She appears distressed.  Cardiovascular: Normal rate, regular rhythm and normal heart sounds.   Pulmonary/Chest: Effort normal and breath sounds normal.  Abdominal: Soft. She exhibits no mass. There is tenderness. There is no rebound and no guarding.  Musculoskeletal: She exhibits tenderness.       Thoracic back: She exhibits tenderness and bony tenderness. She exhibits normal range of motion.  Neurological: She is alert and oriented to person, place, and time.  Skin: Rash noted.  Psychiatric: She has a normal mood and affect.   Results for orders placed in visit on 11/24/12  POCT CBC      Component Value Range   WBC 8.1  4.6 - 10.2 K/uL   Lymph, poc 2.0  0.6 - 3.4   POC LYMPH PERCENT 25.1  10 - 50 %L   MID (cbc) 0.6  0 - 0.9   POC MID % 7.7  0 - 12 %M   POC Granulocyte 5.4  2 - 6.9   Granulocyte percent 67.2  37 - 80 %G   RBC 3.66 (*) 4.04 - 5.48 M/uL   Hemoglobin 11.7 (*) 12.2 - 16.2 g/dL   HCT, POC 27.2 (*) 53.6 - 47.9 %   MCV 99.5 (*) 80 - 97 fL   MCH, POC 32.0 (*) 27 - 31.2 pg   MCHC 32.1  31.8 - 35.4 g/dL   RDW, POC 64.4     Platelet Count, POC 352  142 - 424 K/uL   MPV 6.6  0 - 99.8 fL  POCT UA - MICROSCOPIC ONLY      Component Value Range   WBC, Ur, HPF, POC 0-4     RBC, urine, microscopic 0     Bacteria, U Microscopic trace     Mucus, UA negative     Epithelial cells, urine per micros 0-1     Crystals, Ur, HPF, POC negative     Casts, Ur, LPF, POC negative     Yeast, UA negative    POCT URINALYSIS DIPSTICK      Component Value Range   Color, UA yellow     Clarity, UA cloudy     Glucose, UA neg     Bilirubin, UA neg     Ketones, UA trace     Spec Grav, UA 1.020     Blood, UA neg     pH, UA 7.0     Protein, UA neg     Urobilinogen, UA 0.2     Nitrite, UA neg     Leukocytes, UA Trace     New anemia Sterile cathertrization  Yielded 100-200cc moreand no signs of obstruction.      Assessment & Plan:  Restart valtrex 1g TID shingles are reactivating Cipro 500mg  bid RF HC Called  Hem/onc doc and spoke at length and made referral will see tomorrow. Flu/pneumovax now

## 2012-11-25 ENCOUNTER — Encounter: Payer: Self-pay | Admitting: Hematology & Oncology

## 2012-11-25 ENCOUNTER — Other Ambulatory Visit: Payer: Medicare Other | Admitting: Lab

## 2012-11-25 ENCOUNTER — Ambulatory Visit: Payer: Medicare Other

## 2012-11-25 ENCOUNTER — Ambulatory Visit (HOSPITAL_BASED_OUTPATIENT_CLINIC_OR_DEPARTMENT_OTHER)
Admission: RE | Admit: 2012-11-25 | Discharge: 2012-11-25 | Disposition: A | Discharge status: Home / Self Care | Payer: Medicare Other | Source: Ambulatory Visit | Attending: Hematology & Oncology | Admitting: Hematology & Oncology

## 2012-11-25 ENCOUNTER — Ambulatory Visit (HOSPITAL_BASED_OUTPATIENT_CLINIC_OR_DEPARTMENT_OTHER): Payer: Medicare Other | Admitting: Lab

## 2012-11-25 ENCOUNTER — Ambulatory Visit (HOSPITAL_BASED_OUTPATIENT_CLINIC_OR_DEPARTMENT_OTHER): Payer: Medicare Other | Admitting: Medical

## 2012-11-25 ENCOUNTER — Ambulatory Visit (HOSPITAL_BASED_OUTPATIENT_CLINIC_OR_DEPARTMENT_OTHER): Payer: Medicare Other

## 2012-11-25 ENCOUNTER — Ambulatory Visit: Payer: Medicare Other | Admitting: Medical

## 2012-11-25 VITALS — BP 174/76 | HR 83 | Temp 98.0°F | Resp 16 | Ht 65.0 in | Wt 157.0 lb

## 2012-11-25 DIAGNOSIS — J90 Pleural effusion, not elsewhere classified: Secondary | ICD-10-CM | POA: Insufficient documentation

## 2012-11-25 DIAGNOSIS — B029 Zoster without complications: Secondary | ICD-10-CM | POA: Diagnosis not present

## 2012-11-25 DIAGNOSIS — N63 Unspecified lump in unspecified breast: Secondary | ICD-10-CM

## 2012-11-25 DIAGNOSIS — C9000 Multiple myeloma not having achieved remission: Secondary | ICD-10-CM

## 2012-11-25 DIAGNOSIS — M549 Dorsalgia, unspecified: Secondary | ICD-10-CM

## 2012-11-25 DIAGNOSIS — M8448XA Pathological fracture, other site, initial encounter for fracture: Secondary | ICD-10-CM | POA: Diagnosis not present

## 2012-11-25 LAB — CBC WITH DIFFERENTIAL (CANCER CENTER ONLY)
BASO#: 0 10*3/uL (ref 0.0–0.2)
BASO%: 0.5 % (ref 0.0–2.0)
HCT: 32.5 % — ABNORMAL LOW (ref 34.8–46.6)
HGB: 11 g/dL — ABNORMAL LOW (ref 11.6–15.9)
LYMPH#: 1.6 10*3/uL (ref 0.9–3.3)
LYMPH%: 19.6 % (ref 14.0–48.0)
MCHC: 33.8 g/dL (ref 32.0–36.0)
MCV: 98 fL (ref 81–101)
MONO#: 0.7 10*3/uL (ref 0.1–0.9)
MONO%: 9.3 % (ref 0.0–13.0)
NEUT%: 70.1 % (ref 39.6–80.0)
RDW: 13.7 % (ref 11.1–15.7)

## 2012-11-25 LAB — CHCC SATELLITE - SMEAR

## 2012-11-25 MED ORDER — ZOLEDRONIC ACID 4 MG/100ML IV SOLN
4.0000 mg | Freq: Once | INTRAVENOUS | Status: AC
  Administered 2012-11-25: 4 mg via INTRAVENOUS
  Filled 2012-11-25: qty 100

## 2012-11-25 MED ORDER — OXYCODONE-ACETAMINOPHEN 7.5-325 MG PO TABS: 1.0000 | ORAL_TABLET | Freq: Four times a day (QID) | ORAL | Status: DC | PRN

## 2012-11-25 MED ORDER — SODIUM CHLORIDE 0.9 % IV SOLN
Freq: Once | INTRAVENOUS | Status: AC
  Administered 2012-11-25: 15:00:00 via INTRAVENOUS

## 2012-11-25 NOTE — Progress Notes (Deleted)
CC:   Jacqueline A. Felicity Coyer, MD Jacqueline Rivas. Jacqueline Rivas, M.D.  DIAGNOSES: 1. Recurrent hepatocellular carcinoma. 2. 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 3. Intermittent iron deficiency anemia secondary to gastrointestinal     bleeding.  CURRENT THERAPY:  The patient to start Nexavar 200 mg p.o. b.i.d.  INTERIM HISTORY:  Jacqueline Rivas comes in for followup.  Unfortunately we clearly have recurrence of her hepatocellular carcinoma.  Her alpha- fetoprotein I think was over 22,000.  This has been going up very quickly.  She had a CT scan done.  This was of the abdomen and pelvis on January 31.  This showed 7.7 x 11.7 cm mass in the left hepatic lobe. She also has a subocclusive thrombus within the portal vein which was thought to be tumor invasion.  She has additional areas of tumor involvement of the liver.  This is certainly an unfortunate situation for her.  She does not have the best performance status.  She has a decreased appetite.  She has had no obvious bleeding which is a blessing.  She last got iron back in September.  When she was seen in January her ferritin was 40 and iron saturation of 8.  I am sure her iron is probably getting low again.  She does not have any obvious fluid in the abdomen.  She really does not do all that much.  Her blood sugars are poorly controlled.  These have never been well-controlled and it is mostly because of patient noncompliance.  She has had no fever.  She has had occasional leg swelling.  PHYSICAL EXAMINATION:  General:  This is a somewhat chronically ill- appearing Asian female in no obvious distress.  Vital signs:  Show temperature of 98.6, pulse 96, respiratory rate 18, blood pressure 176/70.  Weight is not taken.  Head and neck:  Shows no scleral icterus. There is no adenopathy in the neck.  There are no oral lesions.  Lungs: Show some decrease at the bases.  Cardiac:  Regular rate and rhythm with a normal S1, S2.  There are no murmurs, rubs  or bruits.  Abdomen:  Soft, mildly obese.  She has some slight tenderness in the right upper quadrant.  There is no obvious hepatomegaly.  I cannot palpate her spleen.  There is no obvious ascites.  Extremities:  Show some 1+ edema in her legs.  LABORATORY STUDIES:  Were not done this visit.  IMPRESSION:  Jacqueline Rivas is a 79 year old [place] female.  She has hepatitis B and C.  She did have a localized hepatocellular carcinoma that was treated with radiofrequency ablation back in July of 2013.  Unfortunately she clearly has rapidly progressive disease.  She does not understand a lot of [place].  She has an interpreter.  She has a Lobbyist who helps her.  I told Jacqueline Rivas that treatment would likely not be effective.  I think the chance of responding is probably going to be 10% or less.  There is a cultural "barrier" regarding this situation.  I think that Jacqueline Rivas has had a hard time understanding the significance of this problem.  I did tell her that without treatment or if treatment did not work, that I did not think she had more than 3 months.  She wants to try treatment.  She understands the complications of taking treatment.  Even though reducing oral chemotherapy this still will have some side affects.  I really want to get hospice for her but since she is on  therapy we will not be able to do this.  We will see about the Nexavar.  I will probably start her on 200 mg twice a day.  I suspect that she probably will need some iron at some point.  We will get her back in 3 weeks' time.  We will see what her hemoglobin is then.  I spent almost an hour with her today.  Ultimately, I think she will understand that this is not curable and that she will succumb to this cancer.    ______________________________ Josph Macho, M.D. PRE/MEDQ  D:  11/24/2012  T:  11/25/2012  Job:  [id number]

## 2012-11-25 NOTE — H&P (Signed)
[facility] NEW PATIENT EVALUATION   Name: Jacqueline Rivas Date: 11/25/2012 MRN: [id number] DOB: 14-Apr-1932  REFERRING PHYSICIAN: Jonita Albee, MD  REASON FOR REFERRAL: Breast mass, +compression fractures, +monoclonal gammopathy and hypercalcemia.    HISTORY OF PRESENT ILLNESS:Jacqueline Rivas is a 79 y.o. female who presented to Dr. Ernestene Rivas office for the history one month, abdominal pain, and low back pain.  She was also complaining of constipation.  She also stated.  She was having a decreased appetite, with no weight loss.  She being quite fatigued and was aching all over.  Labwork was done by Jacqueline Rivas and her sedimentation rate was over 110, she has a positive.  M spike along with hypercalcemia.  She was set up for a CT of the chest, abdomen, and pelvis.  The CT of the chest, revealed a nodular mass in the right breast, measuring 3.8 x 2.2 cm small amount of right base posteriorly atelectasis or infiltrate.  No adenopathy.  Small hiatal hernia.  No pulmonary nodules.  He was noted.  She had compression fractures of the T7, and T5 vertebral body, of indeterminate age.  Compression fracture of the T12 vertebral body.  The CT of the abdomen, and pelvis revealed very slight indistinctness of the margins of the sigmoid colon.  Over a short segment in the left pelvis could indicate mild diverticulitis.  Multiple diverticula concentrated primarily within the rectosigmoid colon.  Increase in linear opacity posteriorly in the right lower lobe with a somewhat nodular character.  Other lab work was performed, and it was noted that she had an M spike of 3.3, 5 g/dL, a monoclonal, IgA kappa protein, was present with the IFE interpretation, she had a calcium of 11.3.  It was at this time she was referred to our office for further workup.  This is quite suspicious for multiple myeloma.  She does have a breast mass, which she is set up for a bilateral mammogram next Thursday.  She is getting a bone  survey this afternoon.  I believe we need to go ahead and set her up for a CT-guided bone marrow, biopsy.  We will have that sent out for flow/cytometry, and FISH.  Her blood counts to look too bad.  At this time.  Her white count is 8.0, hemoglobin 11.0, hematocrit 32.5, and platelets 255,000.  She does report, a decreased appetite.  She does not report any nausea, vomiting, or diarrhea.  She does have constipation.  She denies any fevers, chills, or night sweats.  She denies any cough, chest pain, or shortness of breath.  She denies any palpable adenopathy.  She denies any obvious, or abnormal bleeding.  She denies any headaches, visual changes, or rashes.  She denies any odynophagia or dysphasia.  Of note, she was found to have shingles on the right buttocks area and down.  The right leg.  She is on Valtrex.  Once we get all the imaging studies and bone marrow.  Report.  Back to confirm multiple myeloma.  I think we can go ahead and start with single agent Velcade and dexamethasone.  I feel.  Jacqueline Rivas would tolerate this quite well and, hopefully, she would have a good response to it.  In terms of the breast mass, we will have to wait to reports, from her mammogram and then go from there.  PAST MEDICAL HISTORY:  Diverticulosis; Hiatal hernia; Hyperlipidemia; Hypertension; and Breast mass.     PAST SURGICAL HISTORY: Past Surgical  History  Procedure Date  . Tonsillectomy    Current Outpatient Prescriptions on File Prior to Visit  Medication Sig Dispense Refill  . HYDROcodone-acetaminophen (NORCO/VICODIN) 5-325 MG per tablet Take 1 tablet by mouth every 6 (six) hours as needed for pain.  30 tablet  0  . valACYclovir (VALTREX) 1000 MG tablet Take 1 tablet (1,000 mg total) by mouth 3 (three) times daily.  21 tablet  1    ALLERGIES: No Known Allergies   SOCIAL HISTORY:  reports that she has never smoked. She does not have any smokeless tobacco history on file. She reports that she drinks alcohol.  She reports that she does not use illicit drugs.   FAMILY HISTORY: family history includes Asthma in her maternal grandmother; Heart attack in her brother; and Prostate cancer in her father.   LABORATORY DATA:  Results for orders placed in visit on 11/24/12 (from the past 48 hour(s))  COMPREHENSIVE METABOLIC PANEL     Status: Abnormal   Collection Time   11/24/12 12:19 PM      Component Value Range Comment   Sodium 128 (*) 135 - 145 mEq/L    Potassium 3.8  3.5 - 5.3 mEq/L    Chloride 89 (*) 96 - 112 mEq/L    CO2 25  19 - 32 mEq/L    Glucose, Bld 119 (*) 70 - 99 mg/dL    BUN 8  6 - 23 mg/dL    Creat 4.09  8.11 - 9.14 mg/dL    Total Bilirubin 0.5  0.3 - 1.2 mg/dL    Alkaline Phosphatase 84  39 - 117 U/L    AST 33  0 - 37 U/L    ALT 31  0 - 35 U/L    Total Protein 9.4 (*) 6.0 - 8.3 g/dL    Albumin 3.6  3.5 - 5.2 g/dL    Calcium 78.2 (*) 8.4 - 10.5 mg/dL   POCT CBC     Status: Abnormal   Collection Time   11/24/12 12:27 PM      Component Value Range Comment   WBC 8.1  4.6 - 10.2 K/uL    Lymph, poc 2.0  0.6 - 3.4    POC LYMPH PERCENT 25.1  10 - 50 %L    MID (cbc) 0.6  0 - 0.9    POC MID % 7.7  0 - 12 %M    POC Granulocyte 5.4  2 - 6.9    Granulocyte percent 67.2  37 - 80 %G    RBC 3.66 (*) 4.04 - 5.48 M/uL    Hemoglobin 11.7 (*) 12.2 - 16.2 g/dL    HCT, POC 95.6 (*) 21.3 - 47.9 %    MCV 99.5 (*) 80 - 97 fL    MCH, POC 32.0 (*) 27 - 31.2 pg    MCHC 32.1  31.8 - 35.4 g/dL    RDW, POC 08.6      Platelet Count, POC 352  142 - 424 K/uL    MPV 6.6  0 - 99.8 fL   POCT UA - MICROSCOPIC ONLY     Status: Normal   Collection Time   11/24/12 12:40 PM      Component Value Range Comment   WBC, Ur, HPF, POC 0-4      RBC, urine, microscopic 0      Bacteria, U Microscopic trace      Mucus, UA negative      Epithelial cells, urine per micros 0-1  Crystals, Ur, HPF, POC negative      Casts, Ur, LPF, POC negative      Yeast, UA negative     POCT URINALYSIS DIPSTICK     Status: Normal    Collection Time   11/24/12 12:40 PM      Component Value Range Comment   Color, UA yellow      Clarity, UA cloudy      Glucose, UA neg      Bilirubin, UA neg      Ketones, UA trace      Spec Grav, UA 1.020      Blood, UA neg      pH, UA 7.0      Protein, UA neg      Urobilinogen, UA 0.2      Nitrite, UA neg      Leukocytes, UA Trace          RADIOGRAPHY: CT scan of the chest, January, 28th 2014 Impression: 1. There is a nodular mass in the right breast measures 3.8 x 2.2  cm. Further evaluation with mammography is recommended.  2. Small amount of right base posteriorly atelectasis or  infiltrate. Small atelectasis left base posteriorly.  3. No adenopathy.  4. Small hiatal hernia.  5. No pulmonary nodules.  6. Compression fractures of the T 7 and T5 vertebral body of  indeterminate age. New mild compression fracture of the T12  vertebral body. Clinical correlation is necessary.   CT scan of the abdomen, and pelvis, January, 24th 2014 Impression: 1. Very slight indistinctness of the margins of the sigmoid colon  over a short segment in the left pelvis could indicate mild  diverticulitis. Multiple diverticula concentrated primarily within  the rectosigmoid colon.  2. Increase in linear opacity posteriorly in the right lower lobe  with a somewhat nodular character. This may all represent  atelectasis, but follow-up chest x-ray or CT the chest is  recommended.  3. Small hiatal hernia.   Review of Systems: Constitutional:Negative for malaise/fatigue, fever, chills, weight loss, diaphoresis, activity change, appetite change, and unexpected weight change.  HEENT: Negative for double vision, blurred vision, visual loss, ear pain, tinnitus, congestion, rhinorrhea, epistaxis sore throat or sinus disease, oral pain/lesion, tongue soreness Respiratory: Negative for cough, chest tightness, shortness of breath, wheezing and stridor.  Cardiovascular: Negative for chest pain,  palpitations, leg swelling, orthopnea, PND, DOE or claudication Gastrointestinal: Negative for nausea, vomiting, abdominal pain, diarrhea, constipation, blood in stool, melena, hematochezia, abdominal distention, anal bleeding, rectal pain, anorexia and hematemesis.  Genitourinary: Negative for dysuria, frequency, hematuria,  Musculoskeletal: Negative for myalgias, back pain, joint swelling, arthralgias and gait problem.  Skin: Negative for rash, color change, pallor and wound.  Neurological:. Negative for dizziness/light-headedness, tremors, seizures, syncope, facial asymmetry, speech difficulty, weakness, numbness, headaches and paresthesias.  Hematological: Negative for adenopathy. Does not bruise/bleed easily.  Psychiatric/Behavioral:  Negative for depression, no loss of interest in normal activity or change in sleep pattern.   Physical Exam: This is a pleasant, well-developed, well-nourished, 79 year old, white female, in no obvious distress Vitals: Temperature 98.0 degrees, pulse 83, respirations 16, blood pressure 174/76 HEENT reveals a normocephalic, atraumatic skull, no scleral icterus, no oral lesions  Neck is supple without any cervical or supraclavicular adenopathy.  Lungs are clear to auscultation bilaterally. There are no wheezes, rales or rhonci Cardiac is regular rate and rhythm with a normal S1 and S2. There are no murmurs, rubs, or bruits.  Abdomen is soft with good bowel sounds, there  is no palpable mass. There is no palpable hepatosplenomegaly. There is no palpable fluid wave.  Musculoskeletal no tenderness of the spine, or hips.  She does have tenderness around the T5, T7, and T12 vertebral body Extremities there are no clubbing, cyanosis, or edema.  Skin no petechia, purpura or ecchymosis Neurologic is nonfocal. Right breast/chest wall-she does have a freely mobile mass.  That is about 2 cm x 1 cm.  There is no tenderness, no inverted nipple.  No nipple discharge, erythema,  or skin changes.  There is no palpable right axillary adenopathy  Left breast: Reveals no masses.  No nipple discharge.  No erythema.  No skin changes noted.  No  Tenderness.  There is no palpable left axillary adenopathy  Assessment/Plan: This is a pleasant, 79 year old, female, with the following issues:  #1 most likely multiple myeloma.  She will go ahead and get a bone survey today.  She does have a positive.  M spike.  She also has hypercalcemia.  She has thoracic compression fractions.  We will go ahead and set her up for a bone marrow, biopsy with interventional radiology.  We will send that off for flow/cytology and FISH.  This most likely is multiple myeloma.  I did discussed with both her and her son that we will use single agent Velcade with dexamethasone.  I did discuss the toxicities related to the, Velcade to include, but are not limited to: Peripheral neuropathy: Neutropenia: GI symptoms: Fatigue:Rash, Pyrexia and anorexia.  #2.  Breast mass.  She is going for a bilateral mammogram next Thursday.  Once we get the report from that, we can do further workup if necessary.  #3.  Hypercalcemia.  We will go ahead and give her Zometa today.  We will plan on Zometa every 3-4 weeks.  #4.  Back pain.  This is secondary to a compression fractions.  I did give her prescription for Percocet.  #5.  Shingles.  She is on Valtrex.  #6.  Followup.  Ms. Suriano will follow back up with [place] on 2/28 2014, but before then should there be questions or concerns.  I spent over 60 minutes face-to-face coordinating treatment plans as well as education. The above patient was seen and examined by Dr. Myna Hidalgo.  The above assessment and plan was agreed upon and collaborated with Dr. Myna Hidalgo.

## 2012-11-25 NOTE — Progress Notes (Signed)
This office note has been dictated.

## 2012-11-25 NOTE — Patient Instructions (Signed)
Zoledronic Acid injection (Hypercalcemia, Oncology) What is this medicine? ZOLEDRONIC ACID (ZOE le dron ik AS id) lowers the amount of calcium loss from bone. It is used to treat too much calcium in your blood from cancer. It is also used to prevent complications of cancer that has spread to the bone. This medicine may be used for other purposes; ask your health care provider or pharmacist if you have questions. What should I tell my health care provider before I take this medicine? They need to know if you have any of these conditions: -aspirin-sensitive asthma -dental disease -kidney disease -an unusual or allergic reaction to zoledronic acid, other medicines, foods, dyes, or preservatives -pregnant or trying to get pregnant -breast-feeding How should I use this medicine? This medicine is for infusion into a vein. It is given by a health care professional in a hospital or clinic setting. Talk to your pediatrician regarding the use of this medicine in children. Special care may be needed. Overdosage: If you think you have taken too much of this medicine contact a poison control center or emergency room at once. NOTE: This medicine is only for you. Do not share this medicine with others. What if I miss a dose? It is important not to miss your dose. Call your doctor or health care professional if you are unable to keep an appointment. What may interact with this medicine? -certain antibiotics given by injection -NSAIDs, medicines for pain and inflammation, like ibuprofen or naproxen -some diuretics like bumetanide, furosemide -teriparatide -thalidomide This list may not describe all possible interactions. Give your health care provider a list of all the medicines, herbs, non-prescription drugs, or dietary supplements you use. Also tell them if you smoke, drink alcohol, or use illegal drugs. Some items may interact with your medicine. What should I watch for while using this medicine? Visit  your doctor or health care professional for regular checkups. It may be some time before you see the benefit from this medicine. Do not stop taking your medicine unless your doctor tells you to. Your doctor may order blood tests or other tests to see how you are doing. Women should inform their doctor if they wish to become pregnant or think they might be pregnant. There is a potential for serious side effects to an unborn child. Talk to your health care professional or pharmacist for more information. You should make sure that you get enough calcium and vitamin D while you are taking this medicine. Discuss the foods you eat and the vitamins you take with your health care professional. Some people who take this medicine have severe bone, joint, and/or muscle pain. This medicine may also increase your risk for a broken thigh bone. Tell your doctor right away if you have pain in your upper leg or groin. Tell your doctor if you have any pain that does not go away or that gets worse. What side effects may I notice from receiving this medicine? Side effects that you should report to your doctor or health care professional as soon as possible: -allergic reactions like skin rash, itching or hives, swelling of the face, lips, or tongue -anxiety, confusion, or depression -breathing problems -changes in vision -feeling faint or lightheaded, falls -jaw burning, cramping, pain -muscle cramps, stiffness, or weakness -trouble passing urine or change in the amount of urine Side effects that usually do not require medical attention (report to your doctor or health care professional if they continue or are bothersome): -bone, joint, or muscle pain -  fever -hair loss -irritation at site where injected -loss of appetite -nausea, vomiting -stomach upset -tired This list may not describe all possible side effects. Call your doctor for medical advice about side effects. You may report side effects to FDA at  1-800-FDA-1088. Where should I keep my medicine? This drug is given in a hospital or clinic and will not be stored at home. NOTE: This sheet is a summary. It may not cover all possible information. If you have questions about this medicine, talk to your doctor, pharmacist, or health care provider.  2012, [employer]. (04/03/2011 9:06:58 AM) 

## 2012-11-29 ENCOUNTER — Encounter (HOSPITAL_COMMUNITY): Payer: Self-pay | Admitting: Pharmacy Technician

## 2012-11-29 LAB — COMPREHENSIVE METABOLIC PANEL
ALT: 29 U/L (ref 0–35)
AST: 30 U/L (ref 0–37)
Albumin: 3.3 g/dL — ABNORMAL LOW (ref 3.5–5.2)
BUN: 9 mg/dL (ref 6–23)
Creatinine, Ser: 0.67 mg/dL (ref 0.50–1.10)
Total Bilirubin: 0.6 mg/dL (ref 0.3–1.2)
Total Protein: 8.9 g/dL — ABNORMAL HIGH (ref 6.0–8.3)

## 2012-11-29 LAB — IGG, IGA, IGM
IgA: 4360 mg/dL — ABNORMAL HIGH (ref 69–380)
IgG (Immunoglobin G), Serum: 285 mg/dL — ABNORMAL LOW (ref 690–1700)

## 2012-11-29 LAB — PROTEIN ELECTROPHORESIS, SERUM, WITH REFLEX
Alpha-1-Globulin: 4.3 % (ref 2.9–4.9)
Alpha-2-Globulin: 8.9 % (ref 7.1–11.8)
Gamma Globulin: 39.9 % — ABNORMAL HIGH (ref 11.1–18.8)

## 2012-11-29 LAB — KAPPA/LAMBDA LIGHT CHAINS: Kappa:Lambda Ratio: 202 — ABNORMAL HIGH (ref 0.26–1.65)

## 2012-11-29 LAB — IFE INTERPRETATION

## 2012-12-02 ENCOUNTER — Other Ambulatory Visit: Payer: Self-pay | Admitting: Radiology

## 2012-12-05 ENCOUNTER — Encounter (HOSPITAL_COMMUNITY): Payer: Self-pay

## 2012-12-05 ENCOUNTER — Ambulatory Visit (HOSPITAL_COMMUNITY)
Admission: RE | Admit: 2012-12-05 | Discharge: 2012-12-05 | Disposition: A | Discharge status: Home / Self Care | Payer: Medicare Other | Source: Ambulatory Visit | Attending: Medical | Admitting: Medical

## 2012-12-05 DIAGNOSIS — D472 Monoclonal gammopathy: Secondary | ICD-10-CM | POA: Diagnosis not present

## 2012-12-05 DIAGNOSIS — C9000 Multiple myeloma not having achieved remission: Secondary | ICD-10-CM

## 2012-12-05 DIAGNOSIS — C9030 Solitary plasmacytoma not having achieved remission: Secondary | ICD-10-CM | POA: Diagnosis not present

## 2012-12-05 DIAGNOSIS — E785 Hyperlipidemia, unspecified: Secondary | ICD-10-CM | POA: Insufficient documentation

## 2012-12-05 DIAGNOSIS — D649 Anemia, unspecified: Secondary | ICD-10-CM | POA: Diagnosis not present

## 2012-12-05 DIAGNOSIS — I10 Essential (primary) hypertension: Secondary | ICD-10-CM | POA: Diagnosis not present

## 2012-12-05 DIAGNOSIS — D473 Essential (hemorrhagic) thrombocythemia: Secondary | ICD-10-CM | POA: Diagnosis not present

## 2012-12-05 LAB — CBC
HCT: 34.4 % — ABNORMAL LOW (ref 36.0–46.0)
Hemoglobin: 11.4 g/dL — ABNORMAL LOW (ref 12.0–15.0)
MCHC: 33.1 g/dL (ref 30.0–36.0)
RBC: 3.49 MIL/uL — ABNORMAL LOW (ref 3.87–5.11)

## 2012-12-05 LAB — APTT: aPTT: 27 seconds (ref 24–37)

## 2012-12-05 LAB — BONE MARROW EXAM: Bone Marrow Exam: 121

## 2012-12-05 LAB — PROTIME-INR
INR: 0.99 (ref 0.00–1.49)
Prothrombin Time: 13 seconds (ref 11.6–15.2)

## 2012-12-05 MED ORDER — MIDAZOLAM HCL 2 MG/2ML IJ SOLN
INTRAMUSCULAR | Status: AC | PRN
  Administered 2012-12-05 (×2): 0.5 mg via INTRAVENOUS
  Administered 2012-12-05: 1 mg via INTRAVENOUS

## 2012-12-05 MED ORDER — SODIUM CHLORIDE 0.9 % IV SOLN: INTRAVENOUS | Status: DC

## 2012-12-05 MED ORDER — FENTANYL CITRATE 0.05 MG/ML IJ SOLN
INTRAMUSCULAR | Status: AC | PRN
  Administered 2012-12-05: 50 ug via INTRAVENOUS
  Administered 2012-12-05: 100 ug via INTRAVENOUS

## 2012-12-05 MED ORDER — HYDROCODONE-ACETAMINOPHEN 5-325 MG PO TABS
1.0000 | ORAL_TABLET | ORAL | Status: DC | PRN
  Filled 2012-12-05: qty 2

## 2012-12-05 MED ORDER — MIDAZOLAM HCL 2 MG/2ML IJ SOLN
INTRAMUSCULAR | Status: AC
  Filled 2012-12-05: qty 6

## 2012-12-05 MED ORDER — FENTANYL CITRATE 0.05 MG/ML IJ SOLN
INTRAMUSCULAR | Status: AC
  Filled 2012-12-05: qty 6

## 2012-12-05 NOTE — Procedures (Signed)
CT guided bone marrow aspirates and core biopsies.  No immediate complication.

## 2012-12-05 NOTE — H&P (Signed)
Jacqueline Rivas is an 79 y.o. female.   Chief Complaint: "I'm here for a bone marrow biopsy" HPI: Patient with history of monoclonal gammopathy/positive M spike, hypercalcemia and multiple bony lucent lesions on recent bone scan presents today for CT guided bone marrow biopsy.  Past Medical History  Diagnosis Date  . Diverticulosis   . Hiatal hernia   . Hyperlipidemia   . Hypertension   . Breast mass     Past Surgical History  Procedure Laterality Date  . Tonsillectomy      Family History  Problem Relation Age of Onset  . Prostate cancer Father   . Heart attack Brother   . Asthma Maternal Grandmother    Social History:  reports that she has never smoked. She does not have any smokeless tobacco history on file. She reports that  drinks alcohol. She reports that she does not use illicit drugs.  Allergies: No Known Allergies  Current outpatient prescriptions:calcium carbonate (OS-CAL - DOSED IN MG OF ELEMENTAL CALCIUM) 1250 MG tablet, Take 1 tablet by mouth daily as needed (on occasion)., Disp: , Rfl: ;  Camphor-Eucalyptus-Menthol (VICKS VAPORUB) 4.7-1.2-2.6 % OINT, Apply 1 application topically daily as needed (on occasion)., Disp: , Rfl: ;  ciprofloxacin (CIPRO) 500 MG tablet, Take 500 mg by mouth 2 (two) times daily., Disp: , Rfl:  Coenzyme Q10 (CO Q-10) 100 MG CAPS, Take 10 mg by mouth daily as needed (on occasion)., Disp: , Rfl: ;  magnesium oxide (MAG-OX) 400 MG tablet, Take 400 mg by mouth daily as needed (constipation)., Disp: , Rfl: ;  oxyCODONE-acetaminophen (PERCOCET) 7.5-325 MG per tablet, Take 1 tablet by mouth every 6 (six) hours as needed for pain., Disp: 60 tablet, Rfl: 0 polyethylene glycol (MIRALAX / GLYCOLAX) packet, Take 17 g by mouth 2 (two) times daily., Disp: , Rfl: ;  Potassium 99 MG TABS, Take 99 mg by mouth daily as needed (on occasion)., Disp: , Rfl: ;  valACYclovir (VALTREX) 1000 MG tablet, Take 1 tablet (1,000 mg total) by mouth 3 (three) times daily., Disp:  21 tablet, Rfl: 1;  Zinc 50 MG CAPS, Take 50 mg by mouth daily as needed (on occasion)., Disp: , Rfl:  Current facility-administered medications:0.9 %  sodium chloride infusion, , Intravenous, Continuous, Brayton El, PA;  fentaNYL (SUBLIMAZE) 0.05 MG/ML injection, , , , ;  midazolam (VERSED) 2 MG/2ML injection, , , ,    Results for orders placed during the hospital encounter of 12/05/12 (from the past 48 hour(s))  APTT     Status: None   Collection Time    12/05/12  7:30 AM      Result Value Range   aPTT 27  24 - 37 seconds  CBC     Status: Abnormal   Collection Time    12/05/12  7:30 AM      Result Value Range   WBC 5.5  4.0 - 10.5 K/uL   RBC 3.49 (*) 3.87 - 5.11 MIL/uL   Hemoglobin 11.4 (*) 12.0 - 15.0 g/dL   HCT 16.1 (*) 09.6 - 04.5 %   MCV 98.6  78.0 - 100.0 fL   MCH 32.7  26.0 - 34.0 pg   MCHC 33.1  30.0 - 36.0 g/dL   RDW 40.9  81.1 - 91.4 %   Platelets 408 (*) 150 - 400 K/uL  PROTIME-INR     Status: None   Collection Time    12/05/12  7:30 AM      Result Value Range  Prothrombin Time 13.0  11.6 - 15.2 seconds   INR 0.99  0.00 - 1.49   No results found.  Review of Systems  Constitutional: Negative for fever and chills.  Respiratory: Negative for cough and shortness of breath.   Cardiovascular: Negative for chest pain.  Gastrointestinal: Negative for nausea and vomiting.  Musculoskeletal: Positive for back pain.  Neurological: Negative for headaches.  Endo/Heme/Allergies: Does not bruise/bleed easily.    Blood pressure 172/78, pulse 88, temperature 98.4 F (36.9 C), temperature source Oral, resp. rate 18, SpO2 98.00%. Physical Exam  Constitutional: She is oriented to person, place, and time. She appears well-developed and well-nourished.  Cardiovascular: Normal rate and regular rhythm.   Respiratory: Effort normal.  Few rt basilar crackles  GI: Soft. Bowel sounds are normal. There is no tenderness.  Musculoskeletal: Normal range of motion. She exhibits edema.   Neurological: She is alert and oriented to person, place, and time.     Assessment/Plan Pt with hx of monoclonal gammopathy, positive M spike , hypercalcemia and multiple bony lucent lesions on recent bone scan. Plan is for CT guided bone marrow biopsy to rule out myeloma. Details/risks of procedure d/w pt with her understanding and consent.  ALLRED,D KEVIN 12/05/2012, 8:25 AM

## 2012-12-08 ENCOUNTER — Other Ambulatory Visit: Payer: Self-pay | Admitting: Hematology & Oncology

## 2012-12-09 ENCOUNTER — Other Ambulatory Visit: Payer: Self-pay | Admitting: Internal Medicine

## 2012-12-09 ENCOUNTER — Ambulatory Visit
Admission: RE | Admit: 2012-12-09 | Discharge: 2012-12-09 | Disposition: A | Discharge status: Home / Self Care | Payer: Medicare Other | Source: Ambulatory Visit | Attending: Internal Medicine | Admitting: Internal Medicine

## 2012-12-09 ENCOUNTER — Other Ambulatory Visit: Payer: Self-pay | Admitting: Hematology & Oncology

## 2012-12-09 DIAGNOSIS — C9000 Multiple myeloma not having achieved remission: Secondary | ICD-10-CM

## 2012-12-09 DIAGNOSIS — M549 Dorsalgia, unspecified: Secondary | ICD-10-CM

## 2012-12-09 DIAGNOSIS — N6310 Unspecified lump in the right breast, unspecified quadrant: Secondary | ICD-10-CM

## 2012-12-09 DIAGNOSIS — N63 Unspecified lump in unspecified breast: Secondary | ICD-10-CM

## 2012-12-09 DIAGNOSIS — R928 Other abnormal and inconclusive findings on diagnostic imaging of breast: Secondary | ICD-10-CM | POA: Diagnosis not present

## 2012-12-09 DIAGNOSIS — C50919 Malignant neoplasm of unspecified site of unspecified female breast: Secondary | ICD-10-CM | POA: Diagnosis not present

## 2012-12-09 DIAGNOSIS — C773 Secondary and unspecified malignant neoplasm of axilla and upper limb lymph nodes: Secondary | ICD-10-CM | POA: Diagnosis not present

## 2012-12-12 ENCOUNTER — Ambulatory Visit (HOSPITAL_COMMUNITY): Payer: Medicare Other

## 2012-12-12 ENCOUNTER — Telehealth: Payer: Self-pay | Admitting: Hematology & Oncology

## 2012-12-12 ENCOUNTER — Ambulatory Visit (HOSPITAL_COMMUNITY)
Admission: RE | Admit: 2012-12-12 | Discharge: 2012-12-12 | Disposition: A | Discharge status: Home / Self Care | Payer: Medicare Other | Source: Ambulatory Visit | Attending: Hematology & Oncology | Admitting: Hematology & Oncology

## 2012-12-12 ENCOUNTER — Other Ambulatory Visit: Payer: Self-pay | Admitting: Hematology & Oncology

## 2012-12-12 DIAGNOSIS — M4120 Other idiopathic scoliosis, site unspecified: Secondary | ICD-10-CM | POA: Diagnosis not present

## 2012-12-12 DIAGNOSIS — M549 Dorsalgia, unspecified: Secondary | ICD-10-CM

## 2012-12-12 DIAGNOSIS — M5137 Other intervertebral disc degeneration, lumbosacral region: Secondary | ICD-10-CM | POA: Insufficient documentation

## 2012-12-12 DIAGNOSIS — M47812 Spondylosis without myelopathy or radiculopathy, cervical region: Secondary | ICD-10-CM | POA: Insufficient documentation

## 2012-12-12 DIAGNOSIS — M545 Low back pain, unspecified: Secondary | ICD-10-CM | POA: Insufficient documentation

## 2012-12-12 DIAGNOSIS — M546 Pain in thoracic spine: Secondary | ICD-10-CM | POA: Insufficient documentation

## 2012-12-12 DIAGNOSIS — Q7649 Other congenital malformations of spine, not associated with scoliosis: Secondary | ICD-10-CM | POA: Diagnosis not present

## 2012-12-12 DIAGNOSIS — C9000 Multiple myeloma not having achieved remission: Secondary | ICD-10-CM | POA: Diagnosis not present

## 2012-12-12 DIAGNOSIS — N281 Cyst of kidney, acquired: Secondary | ICD-10-CM | POA: Diagnosis not present

## 2012-12-12 DIAGNOSIS — M8448XA Pathological fracture, other site, initial encounter for fracture: Secondary | ICD-10-CM | POA: Insufficient documentation

## 2012-12-12 DIAGNOSIS — M51379 Other intervertebral disc degeneration, lumbosacral region without mention of lumbar back pain or lower extremity pain: Secondary | ICD-10-CM | POA: Insufficient documentation

## 2012-12-12 DIAGNOSIS — K7689 Other specified diseases of liver: Secondary | ICD-10-CM | POA: Diagnosis not present

## 2012-12-12 LAB — CHROMOSOME ANALYSIS, BONE MARROW

## 2012-12-12 MED ORDER — GADOBENATE DIMEGLUMINE 529 MG/ML IV SOLN
14.0000 mL | Freq: Once | INTRAVENOUS | Status: AC | PRN
  Administered 2012-12-12: 14 mL via INTRAVENOUS

## 2012-12-12 NOTE — Telephone Encounter (Signed)
Pt aware of 2-24 MRI at 445 pm and not to eat 2 hrs prior

## 2012-12-13 ENCOUNTER — Encounter: Payer: Self-pay | Admitting: Radiation Oncology

## 2012-12-13 ENCOUNTER — Ambulatory Visit
Admission: RE | Admit: 2012-12-13 | Discharge: 2012-12-13 | Disposition: A | Discharge status: Home / Self Care | Payer: Medicare Other | Source: Ambulatory Visit | Attending: Radiation Oncology | Admitting: Radiation Oncology

## 2012-12-13 DIAGNOSIS — Z510 Encounter for antineoplastic radiation therapy: Secondary | ICD-10-CM | POA: Diagnosis not present

## 2012-12-13 DIAGNOSIS — C7951 Secondary malignant neoplasm of bone: Secondary | ICD-10-CM

## 2012-12-13 DIAGNOSIS — C9000 Multiple myeloma not having achieved remission: Secondary | ICD-10-CM | POA: Insufficient documentation

## 2012-12-13 MED ORDER — MORPHINE SULFATE 4 MG/ML IJ SOLN
2.0000 mg | Freq: Once | INTRAMUSCULAR | Status: AC
  Administered 2012-12-13: 4 mg via INTRAMUSCULAR
  Filled 2012-12-13: qty 1

## 2012-12-13 MED ORDER — MORPHINE SULFATE 4 MG/ML IJ SOLN
2.0000 mg | Freq: Once | INTRAMUSCULAR | Status: DC
  Filled 2012-12-13: qty 1

## 2012-12-13 NOTE — Progress Notes (Addendum)
At 1500 Jacqueline Rivas was sound asleep on the simulation treatment table and her respirations were 16/min.  No c/o pain at this time.  Her son was notified and instructed to ensure Jacqueline Rivas takes her Dilaudid on a regular basis to prevent exacerbation of her pain to unmanageable levels.  He stated understanding of the pain management plan. He also revealed she has Mirala which she takes BIB and has MOM as a "back-up" if needed.

## 2012-12-13 NOTE — Progress Notes (Signed)
Morphine 2mg  IM to be given to patient per Dr.Squirte, verified dose of Morphine  with Reinaldo Berber RN, patient gave name and dob as identification, pain a 8-10 in lower back and abdomen stated  , assisted pat standing , gave 2mg  IM Morphine left ventr-gluteal, patient tolerated well,, assisted patient to bathroom via w/c, and then patient sent to CT sim ,son at side  2:34 PM

## 2012-12-13 NOTE — Progress Notes (Signed)
Here today for assessment for bone mets to her T- spine.  Jacqueline Rivas reports minimal relief of her pain with Dilaudid. Pin in lower back and abd.  Dilaudid dulls the pain and then she is able to turn in bed, but only sleeps about 4 hours.   She reports that she is only voiding small amounts and has not had a BM in one week.  She has noticed since christmas that voiding and defecation have changed.  She was catheterized "a couple" of weeks ago with resulting volume of ~ "a cup".  Once she got home from this visit she states she urinated a large amount ( uncontrolled)..   Swelling of lower extremites, especially in her ankles and she winces whenever the right ankle touched.

## 2012-12-13 NOTE — Progress Notes (Signed)
Radiation Oncology         [phone] ________________________________  Initial outpatient Consultation  Name: Jacqueline Rivas MRN: [id number]  Date: 12/13/2012  DOB: June 20, 1932  YQ:[id number], Jacqueline Stapler, MD  Jacqueline Macho, MD   REFERRING PHYSICIAN: Josph Macho, MD  DIAGNOSIS: Spinal lesions consistent with Multiple Myeloma; Also, Locally advanced breast cancer.  HISTORY OF PRESENT ILLNESS::Jacqueline Rivas is a lovely 79 y.o. female who felt a right breast mass last summer. She had not had a mammogram for years and did not seek medical attention for this.  She more recently developed "gut" and "low back pain" and her primary doctor obtained several tests - ultimately, blood work raised suspicion for multiple myeloma.  She has undergone CT imaging of Chest and Abd in the end of January - remarkable for colon diverticulum, a nodular mass in the right breast measures 3.8 x 2.2 cm, and compression fractures at T5, T7, T12. Bone marrow bx on 2-17 was + for "Hypercellular bone marrow for age with plasma cell neoplasm."  Breast biopsy (R, core bx) on 2-21 showed invasive ductal carcinoma, Grade III. Right axillary node biopsy showed metastatic carcinoma.  She has been seen by Dr. Myna Hidalgo with administration of Zometa - and eventual plans for further systemic therapy in the form of Dexamethsone and Velcade. She underwent MRI of her T-L spine earlier today, and it demonstrated diffuse thoracic infiltration consistent with multiple myeloma. Multilevel pathologic fractures - most impressive at T12 > T7. Invasion of central canal from T10-T12. Six lumbar vertebrae - no compression fractures in L spine, but diffuse marrow signal c/w Myeloma. I spoke with radiology - they feel that the imaging from her CT and MRI are most compatible with Myelomatous disease in the spine rather than metastatic disease from her breast tumor. She has been prescribed Dilaudid - patient unsure of dose - but she is taking it  infrequently due to reluctance to take medication in general.  She is is significant pain, centered around T12, radiating bilaterally down the flanks.  Medical Oncology is going to eventually refer her to IR for discussion of kyphoplasty.   She also reports that she has been retaining urine since Christmas.  She has infrequent bowel movements.She has LE edema, unknown cause.  No prior steroid treatments thus far. She is unsteady on her feet but can walk gingerly, with a recent fall causing bruising on her face ( 1 week ago).  No focal neurologic deficits.    PREVIOUS RADIATION THERAPY: No  PAST MEDICAL HISTORY:  has a past medical history of Diverticulosis; Hiatal hernia; Hyperlipidemia; Hypertension; and Breast mass.    PAST SURGICAL HISTORY: Past Surgical History  Procedure Laterality Date  . Tonsillectomy      FAMILY HISTORY: family history includes Asthma in her maternal grandmother; Heart attack in her brother; and Prostate cancer in her father.  SOCIAL HISTORY:  reports that she has never smoked. She does not have any smokeless tobacco history on file. She reports that  drinks alcohol. She reports that she does not use illicit drugs. Husband died 26 yrs ago. She has 4 children, grown up. Son here today - lymphoma survivor  ALLERGIES: Review of patient's allergies indicates no known allergies.  MEDICATIONS:  Current Outpatient Prescriptions  Medication Sig Dispense Refill  . calcium carbonate (OS-CAL - DOSED IN MG OF ELEMENTAL CALCIUM) 1250 MG tablet Take 1 tablet by mouth daily as needed (on occasion).      . Camphor-Eucalyptus-Menthol (VICKS VAPORUB)  4.7-1.2-2.6 % OINT Apply 1 application topically daily as needed (on occasion).      . Coenzyme Q10 (CO Q-10) 100 MG CAPS Take 10 mg by mouth daily as needed (on occasion).      . magnesium oxide (MAG-OX) 400 MG tablet Take 400 mg by mouth daily as needed (constipation).      . polyethylene glycol (MIRALAX / GLYCOLAX) packet Take 17  g by mouth 2 (two) times daily.      . Potassium 99 MG TABS Take 99 mg by mouth daily as needed (on occasion).      . valACYclovir (VALTREX) 1000 MG tablet Take 1 tablet (1,000 mg total) by mouth 3 (three) times daily.  21 tablet  1  . Zinc 50 MG CAPS Take 50 mg by mouth daily as needed (on occasion).       No current facility-administered medications for this encounter.   Facility-Administered Medications Ordered in Other Encounters  Medication Dose Route Frequency Provider Last Rate Last Dose  . morphine 4 MG/ML injection 2 mg  2 mg Intramuscular Once Lonie Peak, MD        REVIEW OF SYSTEMS:  A 15 point review of systems is documented in the electronic medical record. This was obtained by the nursing staff. However, I reviewed this with the patient to discuss relevant findings and make appropriate changes.  Pertinent items are noted in HPI.   PHYSICAL EXAM:   Vitals with Age-Percentiles 12/13/2012  Length   Systolic 163  Diastolic 73  Pulse 87  Respiration 20  Weight 65.182 kg  VISIT REPORT    General: Alert and oriented, in no acute distress HEENT: Head is normocephalic. Bruises over face. Pupils are equally round. Extraocular movements are intact. Oropharynx is clear. Heart: Regular in rate and rhythm  Chest: Clear to auscultation bilaterally, with no rhonchi, wheezes, or rales. Abdomen: Soft, slightly tender diffusely, nondistended, with no rigidity or guarding. Extremities: lower extremity edema. tenderness in Right calf > left.  Lymphatics:+ Right axilla lymphadenopathy. Skin: facial bruises Musculoskeletal: symmetric strength and muscle tone throughout. Pain in the T12 region, radiating down ribs bilaterally along flanks Neurologic: Cranial nerves II through XII are grossly intact. No obvious focalities. Speech is fluent.  Psychiatric: Judgment and insight are intact. Affect is appropriate. Breasts: Right breast mass - 4cm in size - not fixed to chest wall, no  ulceration    LABORATORY DATA:  Lab Results  Component Value Date   WBC 5.5 12/05/2012   HGB 11.4* 12/05/2012   HCT 34.4* 12/05/2012   MCV 98.6 12/05/2012   PLT 408* 12/05/2012   CMP     Component Value Date/Time   NA 127* 11/25/2012 1315   K 3.5 11/25/2012 1315   CL 89* 11/25/2012 1315   CO2 25 11/25/2012 1315   GLUCOSE 100* 11/25/2012 1315   BUN 9 11/25/2012 1315   CREATININE 0.67 11/25/2012 1315   CREATININE 0.64 11/24/2012 1219   CALCIUM 11.1* 11/25/2012 1315   CALCIUM 11.1* 11/13/2012 1526   PROT 8.9* 11/25/2012 1315   ALBUMIN 3.3* 11/25/2012 1315   AST 30 11/25/2012 1315   ALT 29 11/25/2012 1315   ALKPHOS 79 11/25/2012 1315   BILITOT 0.6 11/25/2012 1315         RADIOGRAPHY: Ct Chest W Contrast  11/15/2012  *RADIOLOGY REPORT*  Clinical Data: Abnormal chest x-ray  CT CHEST WITH CONTRAST  Technique:  Multidetector CT imaging of the chest was performed following the standard protocol during bolus administration  of intravenous contrast.  Contrast: 75mL OMNIPAQUE IOHEXOL 300 MG/ML  SOLN  Comparison: 11/11/2012  Findings: There is a nodular mass in the right breast measures 3.8 x 2.2 cm.  Further evaluation with mammogram is recommended.  There is significant compression fracture of the T7 vertebral body of indeterminate age.  Mild compression fracture superior endplate of the T5 vertebral body of indeterminate age.  Spinal canal is patent.  No evidence of bony protrusion  in spinal canal.  There is mild compression fracture superior endplate of the T12 vertebral body new from prior exam.  Central airways are patent.  Atherosclerotic calcifications of the thoracic aorta.  Small hiatal hernia measures 5.2 x 3.5 cm.  There is infiltrate or atelectasis in the right lower lobe posteriorly.  Small amount of atelectasis noted left base posteriorly.  No pulmonary nodules.  No pulmonary edema.  No destructive rib lesions are noted.  No adenopathy.  A low density nodule is noted in the right lobe of the thyroid gland  measures 8 mm.  Correlation with thyroid gland ultrasound is recommended.  Atherosclerotic calcifications of the coronary arteries.  No pericardial effusion is noted.  IMPRESSION:  1.  There is a nodular mass in the right breast measures 3.8 x 2.2 cm.  Further evaluation with mammography is recommended. 2.  Small amount of right base posteriorly atelectasis or infiltrate.  Small atelectasis left base posteriorly. 3.  No adenopathy. 4.  Small hiatal hernia. 5.  No pulmonary nodules. 6.  Compression fractures of the T 7 and T5 vertebral body of indeterminate age.  New mild compression fracture of the T12 vertebral body.  Clinical correlation is necessary.   Original Report Authenticated By: Natasha Mead, M.D.    Mr Thoracic Spine W Wo Contrast  12/13/2012  *RADIOLOGY REPORT*  Clinical Data:  Multiple myeloma.  Severe back pain.  Compression fractures.  MRI THORACIC AND LUMBAR SPINE WITHOUT AND WITH CONTRAST  Technique:  Multiplanar and multiecho pulse sequences of the thoracic and lumbar spine were obtained without and with intravenous contrast.  Contrast: 14mL MULTIHANCE GADOBENATE DIMEGLUMINE 529 MG/ML IV SOLN  Comparison:  CT chest and c t abdomen pelvis 01/24 and 01/28.  MRI THORACIC SPINE  Findings: Spinal segmentation when tended from the craniocervical junction, the most accurate method, demonstrates the seven cervical vertebrae, twelfth thoracic vertebrae and six lumbar type vertebrae with the lumbosacral transitional vertebra termed L6.  Cervical spine localizer demonstrates cervical spondylosis.  Visual references are available with severe compression deformities of T7 and T12.  Paraspinal soft tissues show dependent atelectasis in the lungs, more prominent on the right than left which correlates with prior chest CT.  There is a small soft tissue nodule which demonstrates post- gadolinium enhancement measuring 9 mm by 11 mm (image 41 series 180) inferior to the right T12 rib.  This is in the paraspinal  musculature.  The nodule shows avid enhancement although the appearance is nonspecific.  Differential considerations include small peripheral nerve sheath tumor or small neoplasm.  This does not originate in bone, making plasmacytoma unlikely.  Multiple thoracic compression fractures are present.  Thoracic degenerative disease is relatively minor.  There is diffuse abnormal enhancement of bone marrow compatible with history of multiple myeloma.  T3: Old superior endplate compression fracture without marrow edema.  T5:  superior endplate compression fracture with mild marrow edema and enhancement.  Between 25 and 50% loss of anterior vertebral body height without retropulsion.  T7:  Severe compression fracture with greater than  275% loss of vertebral body height anteriorly.  Minimal retropulsion without significant stenosis.  The collapse produces thoracic kyphosis and angulation of the thoracic cord.  Bone marrow edema remains present.  This fracture appears little changed compared to CT 11/15/2012.  The  T11:  Superior endplate compression fracture with 25% loss of vertebral body height.  Very mild retropulsion without central canal compromise.  Fracture cleavage plane remains present. Diffuse enhancement of the vertebral body with paravertebral phlegmon compatible with acute or subacute compression fracture. This compression fracture is new.  T12:  Progressive collapse of previously seen compression fracture, with greater than 75% loss of vertebral body height.  Retropulsion measures 6 mm and produces moderate to severe central stenosis in conjunction with extraosseous extension of tumor.  There is abnormal enhancement of epidural soft tissue from T10-T12 bilaterally, more prominent on the right, most compatible with extraosseous extension of tumor.  IMPRESSION: 1.  Diffuse infiltration of the thoracic spine compatible with history of multiple myeloma and pathologic compression fractures. Extraosseous extension of  tumor from T10-T12 and invasion of the central canal with moderate to severe central stenosis due to both pathologic compression fracture and extraosseous extension of tumor. These results will be called to the ordering clinician or representative by the Radiologist Assistant, and communication documented in the PACS Dashboard. 2.  Acute/ subacute T11 compression fracture with 25% loss of vertebral body height and no retropulsion. 3.  Further collapse of T12 compression fracture with retropulsion central stenosis. 4.  Subacute to chronic T5 and T7 compression fractures. 5.  Small extraspinal soft tissue nodule inferior to the right T12 rib appears unchanged compared 11/11/2012 CT.  MRI LUMBAR SPINE  Findings: As noted above, lumbosacral transitional anatomy is present with six lumbar type vertebral bodies.  Diffuse infiltration of the marrow.  There are no lumbar spine compression fractures.  Dependent edema/fluid is present in the dorsal subcutaneous tissues.  There is a levoconvex lumbar scoliosis. Gallbladder is distended.  Hepatic and small renal cysts are present.  Sacral marrow signal is within normal limits.  Disc desiccation and collapse of the discs are present at every level, with relative sparing of L3-L4 and L6-S1.  L1-L2:  Left eccentric broad-based posterior disc bulging extending into the left neural foramen.  Mild left foraminal stenosis.  L2-L3:  Left eccentric broad-based posterior disc bulging.  L3-L4:  Left eccentric broad-based posterior disc bulging with left posterolateral protrusion.  Foramina appear adequately patent.  L4-L5:  Right greater than left facet hypertrophy.  Mild central stenosis.  Disc degeneration with broad-based posterior bulging. Severe right foraminal stenosis with mild to moderate left foraminal stenosis.  L5-S1:  Disc degeneration.  Mild central stenosis.  Left greater than right lateral recess stenosis is multifactorial.  L5-S1:  Disc and facet degeneration.  No  compressive stenosis.  IMPRESSION: Diffuse abnormal marrow signal compatible with history multiple myeloma. Lumbosacral transitional anatomy with six lumbar type vertebral bodies.  Lumbar spondylosis without lumbar compression fractures.   Original Report Authenticated By: Andreas Newport, M.D.    Mr Lumbar Spine W Wo Contrast  12/13/2012  *RADIOLOGY REPORT*  Clinical Data:  Multiple myeloma.  Severe back pain.  Compression fractures.  MRI THORACIC AND LUMBAR SPINE WITHOUT AND WITH CONTRAST  Technique:  Multiplanar and multiecho pulse sequences of the thoracic and lumbar spine were obtained without and with intravenous contrast.  Contrast: 14mL MULTIHANCE GADOBENATE DIMEGLUMINE 529 MG/ML IV SOLN  Comparison:  CT chest and c t abdomen pelvis 01/24 and 01/28.  MRI THORACIC SPINE  Findings: Spinal segmentation when tended from the craniocervical junction, the most accurate method, demonstrates the seven cervical vertebrae, twelfth thoracic vertebrae and six lumbar type vertebrae with the lumbosacral transitional vertebra termed L6.  Cervical spine localizer demonstrates cervical spondylosis.  Visual references are available with severe compression deformities of T7 and T12.  Paraspinal soft tissues show dependent atelectasis in the lungs, more prominent on the right than left which correlates with prior chest CT.  There is a small soft tissue nodule which demonstrates post- gadolinium enhancement measuring 9 mm by 11 mm (image 41 series 180) inferior to the right T12 rib.  This is in the paraspinal musculature.  The nodule shows avid enhancement although the appearance is nonspecific.  Differential considerations include small peripheral nerve sheath tumor or small neoplasm.  This does not originate in bone, making plasmacytoma unlikely.  Multiple thoracic compression fractures are present.  Thoracic degenerative disease is relatively minor.  There is diffuse abnormal enhancement of bone marrow compatible with  history of multiple myeloma.  T3: Old superior endplate compression fracture without marrow edema.  T5:  superior endplate compression fracture with mild marrow edema and enhancement.  Between 25 and 50% loss of anterior vertebral body height without retropulsion.  T7:  Severe compression fracture with greater than 275% loss of vertebral body height anteriorly.  Minimal retropulsion without significant stenosis.  The collapse produces thoracic kyphosis and angulation of the thoracic cord.  Bone marrow edema remains present.  This fracture appears little changed compared to CT 11/15/2012.  The  T11:  Superior endplate compression fracture with 25% loss of vertebral body height.  Very mild retropulsion without central canal compromise.  Fracture cleavage plane remains present. Diffuse enhancement of the vertebral body with paravertebral phlegmon compatible with acute or subacute compression fracture. This compression fracture is new.  T12:  Progressive collapse of previously seen compression fracture, with greater than 75% loss of vertebral body height.  Retropulsion measures 6 mm and produces moderate to severe central stenosis in conjunction with extraosseous extension of tumor.  There is abnormal enhancement of epidural soft tissue from T10-T12 bilaterally, more prominent on the right, most compatible with extraosseous extension of tumor.  IMPRESSION: 1.  Diffuse infiltration of the thoracic spine compatible with history of multiple myeloma and pathologic compression fractures. Extraosseous extension of tumor from T10-T12 and invasion of the central canal with moderate to severe central stenosis due to both pathologic compression fracture and extraosseous extension of tumor. These results will be called to the ordering clinician or representative by the Radiologist Assistant, and communication documented in the PACS Dashboard. 2.  Acute/ subacute T11 compression fracture with 25% loss of vertebral body height and  no retropulsion. 3.  Further collapse of T12 compression fracture with retropulsion central stenosis. 4.  Subacute to chronic T5 and T7 compression fractures. 5.  Small extraspinal soft tissue nodule inferior to the right T12 rib appears unchanged compared 11/11/2012 CT.  MRI LUMBAR SPINE  Findings: As noted above, lumbosacral transitional anatomy is present with six lumbar type vertebral bodies.  Diffuse infiltration of the marrow.  There are no lumbar spine compression fractures.  Dependent edema/fluid is present in the dorsal subcutaneous tissues.  There is a levoconvex lumbar scoliosis. Gallbladder is distended.  Hepatic and small renal cysts are present.  Sacral marrow signal is within normal limits.  Disc desiccation and collapse of the discs are present at every level, with relative sparing of L3-L4 and L6-S1.  L1-L2:  Left eccentric broad-based posterior disc bulging extending into the left neural foramen.  Mild left foraminal stenosis.  L2-L3:  Left eccentric broad-based posterior disc bulging.  L3-L4:  Left eccentric broad-based posterior disc bulging with left posterolateral protrusion.  Foramina appear adequately patent.  L4-L5:  Right greater than left facet hypertrophy.  Mild central stenosis.  Disc degeneration with broad-based posterior bulging. Severe right foraminal stenosis with mild to moderate left foraminal stenosis.  L5-S1:  Disc degeneration.  Mild central stenosis.  Left greater than right lateral recess stenosis is multifactorial.  L5-S1:  Disc and facet degeneration.  No compressive stenosis.  IMPRESSION: Diffuse abnormal marrow signal compatible with history multiple myeloma. Lumbosacral transitional anatomy with six lumbar type vertebral bodies.  Lumbar spondylosis without lumbar compression fractures.   Original Report Authenticated By: Andreas Newport, M.D.    US Breast Right  12/09/2012  *RADIOLOGY REPORT*  Clinical Data:  The patient has compression fractures in the spine and lytic  lesions in the skull, suspicious for multiple myeloma. Breast mass was seen on chest CT.  DIGITAL DIAGNOSTIC BILATERAL MAMMOGRAM CAD AND RIGHT BREAST ULTRASOUND:  Comparison:  Recent CT exam of the chest  Findings:  ACR Breast Density Category 3: The breast tissue is heterogeneously dense. 2: There is a scattered fibroglandular pattern.  Within the upper central portion of the right breast, there is a lobulated irregular mass associated with pleomorphic and linear calcifications.  This mass is palpable as denoted by a BB placed in the area of patient's concern.  Mammographically, the mass measures approximately 4.8 x 3.8 x 4.2 cm.  On the left, no suspicious abnormality is identified.  Mammographic images were processed with CAD.  On physical exam, I palpate a rounded mobile nontender mass in the 12 o'clock location of the right breast.  Mass is visible when the patient is sits partially erect.  I palpate no axillary adenopathy.  Ultrasound is performed, showing an irregular hypoechoic mass with increased through transmission in the 12 o'clock location of the right breast 4 cm from nipple.  Mass measures 4.5 x 3.5 x 2.2 cm. Internal hyper echoic foci are seen, consistent with calcifications.  Mass is vascular on Doppler flow evaluation.  Evaluation of the right axilla shows an enlarged lower right axillary lymph node with prominent vascularity.  IMPRESSION:  1.  Findings are suspicious for right breast cancer and metastatic right axillary lymph node. 2.  Left breast is negative by imaging.  RECOMMENDATION: Ultrasound guided core biopsy is suggested of the right breast mass and right axillary lymph node.  The patient requests biopsy on the same day.  This is performed and is dictated separately.  I have discussed the findings and recommendations with the patient. Results were also provided in writing at the conclusion of the visit.  BI-RADS CATEGORY 5:  Highly suggestive of malignancy - appropriate action should be  taken.   Original Report Authenticated By: Norva Pavlov, M.D.    Ct Biopsy  12/05/2012  *RADIOLOGY REPORT*  Clinical history: 79 year old with monoclonal gammopathy and hypercalcemia   PROCEDURE(S): CT GUIDED BONE MARROW ASPIRATES AND BIOPSY  Physician: Rachelle Hora. Henn, MD   Medications: Versed 2 mg, Fentanyl 150 mcg. A radiology nurse monitored the patient for moderate sedation.  Sedation time:  15 minutes   Procedure: The procedure was explained to the patient. The risks and benefits of the procedure were discussed and the patient's questions were addressed.  Informed consent was obtained from the patient. The patient was placed  prone on CT scan. Images of the pelvis were obtained. The right side of back was prepped and draped in sterile fashion. The skin and right posterior iliac bone were anesthetized with 1% lidocaine.   11 gauge bone needle was directed into the right iliac bone with CT guidance. Two aspirates and three core biopsies obtained.   Findings: Diffuse osteopenia of the bones.  Needle directed in the posterior right iliac bone.  Complications: None   Impression: CT guided bone marrow aspirates and core biopsy.   Original Report Authenticated By: Richarda Overlie, M.D.    Dg Bone Survey Met  11/25/2012  *RADIOLOGY REPORT*  Clinical Data: 79 year old female with breast mass.  Thoracic compression fractures.  METASTATIC BONE SURVEY  Comparison: CT chest 11/15/2012.  CT abdomen and pelvis 11/11/2012.  Findings: Innumerable small lucent lesions in the calvarium, with a posterior predominance.  Advanced osteopenia in the cervical spine, especially the C4 and C5 vertebrae.  No expansile rib lesion identified.  Continued streaky and patchy opacity at both lung bases with evidence of small pleural effusions.  Thoracic compression fractures re-identified at T5, T7, and T12. There is now T12 vertebral plana whereas only minimal compression was present last month.  The other levels appear stable.  Subtle  heterogeneity of bone mineralization about the right shoulder including the scapula and proximal right humerus.  Bone mineralization about the left shoulder appears within normal limits.  Occasional subtle lucent lesions in the left humerus shaft.  No definite lytic or lucent lesion in the pelvis.  No definite lesion in the proximal femurs.  Transitional lumbosacral anatomy with lumbarized S1 level suspected.  Lumbar vertebral height and alignment appears stable and no lumbar lytic lesion is identified.  IMPRESSION: 1.  Interval T12 vertebral plana.  Numerous lucent lesions in the calvarium.  Subtle lucent lesions in the humeri.  Constellation of findings most compatible with widespread multiple myeloma, whereas metastatic breast cancer is less likely. 2.  Interval stable T5 and T7 compression fractures.  Incidental transitional lumbosacral anatomy. 3.  Small pleural effusions and streaky bibasilar opacity, favor atelectasis.   Original Report Authenticated By: Erskine Speed, M.D.    [facility]  12/09/2012  *RADIOLOGY REPORT*  Clinical Data:  The patient has compression fractures in the spine and lytic lesions in the skull, suspicious for multiple myeloma. Breast mass was seen on chest CT.  DIGITAL DIAGNOSTIC BILATERAL MAMMOGRAM CAD AND RIGHT BREAST ULTRASOUND:  Comparison:  Recent CT exam of the chest  Findings:  ACR Breast Density Category 3: The breast tissue is heterogeneously dense. 2: There is a scattered fibroglandular pattern.  Within the upper central portion of the right breast, there is a lobulated irregular mass associated with pleomorphic and linear calcifications.  This mass is palpable as denoted by a BB placed in the area of patient's concern.  Mammographically, the mass measures approximately 4.8 x 3.8 x 4.2 cm.  On the left, no suspicious abnormality is identified.  Mammographic images were processed with CAD.  On physical exam, I palpate a rounded mobile nontender mass in the 12  o'clock location of the right breast.  Mass is visible when the patient is sits partially erect.  I palpate no axillary adenopathy.  Ultrasound is performed, showing an irregular hypoechoic mass with increased through transmission in the 12 o'clock location of the right breast 4 cm from nipple.  Mass measures 4.5 x 3.5 x 2.2 cm. Internal hyper echoic foci are seen, consistent with calcifications.  Mass is vascular on Doppler flow evaluation.  Evaluation of the right axilla shows an enlarged lower right axillary lymph node with prominent vascularity.  IMPRESSION:  1.  Findings are suspicious for right breast cancer and metastatic right axillary lymph node. 2.  Left breast is negative by imaging.  RECOMMENDATION: Ultrasound guided core biopsy is suggested of the right breast mass and right axillary lymph node.  The patient requests biopsy on the same day.  This is performed and is dictated separately.  I have discussed the findings and recommendations with the patient. Results were also provided in writing at the conclusion of the visit.  BI-RADS CATEGORY 5:  Highly suggestive of malignancy - appropriate action should be taken.   Original Report Authenticated By: Norva Pavlov, M.D.    [place] Rt Breast Bx W Loc Dev 1st Lesion Img Bx Spec US Guide  12/12/2012  **ADDENDUM** CREATED: 12/12/2012 10:48:18  Histologic evaluation demonstrates grade III invasive ductal carcinoma involving the mass at 12 o'clock in the right breast. There is metastatic carcinoma involving the right axillary lymph node.  Findings were discussed with the patient's son, Jalayah Gutridge, by telephone at the request of the patient.  He reports that she has had no problems with the breast or axillary biopsies. She has significant back pain from compression fractures.  Surgical consultation has been scheduled with Dr. Jaclynn Guarneri for 12/16/2012 at 2:15 p.m.  **END ADDENDUM** SIGNED BY: Cain Saupe, M.D.   12/09/2012  *RADIOLOGY REPORT*  Clinical  Data:  Suspicious mass right breast and suspicious mass right axilla for biopsy.  ULTRASOUND GUIDED CORE BIOPSY OF THE right BREAST  Comparison: Previous exams.  I met with the patient and we discussed the procedure of ultrasound- guided biopsy, including benefits and alternatives.  We discussed the high likelihood of a successful procedure. We discussed the risks of the procedure, including infection, bleeding, tissue injury, clip migration, and inadequate sampling.  Informed written consent was given.  Using sterile technique  lidocaine, ultrasound guidance and a 14 gauge automated biopsy device, biopsy was performed of a 4.5 cm mass in the right breast 12 o'clock using a lateral approach.  At the conclusion of the procedure a  tissue marker clip was deployed into the biopsy cavity.  Follow up 2 view mammogram was not performed due to the patient's mobility limitations and due to the large size of the lesion.  Using sterile technique . lidocaine, ultrasound guidance and a 14 gauge automated biopsy device, biopsy was performed of  abnormal right axillary lymph node. using a .lateral. approach.  At the conclusion of the procedure no tissue marker clip was deployed into the biopsy cavity.  IMPRESSION: Ultrasound guided biopsy of right breast and right axilla.  No apparent complications.  Original Report Authenticated By: Sherian Rein, M.D.    [place] Rt Breast Bx W Loc Dev Ea Add Lesion Img Bx Spec US Guide  12/12/2012  **ADDENDUM** CREATED: 12/12/2012 10:48:18  Histologic evaluation demonstrates grade III invasive ductal carcinoma involving the mass at 12 o'clock in the right breast. There is metastatic carcinoma involving the right axillary lymph node.  Findings were discussed with the patient's son, Johnna Bollier, by telephone at the request of the patient.  He reports that she has had no problems with the breast or axillary biopsies. She has significant back pain from compression fractures.  Surgical consultation has  been scheduled with Dr. Jaclynn Guarneri for 12/16/2012 at 2:15 p.m.  **END ADDENDUM** SIGNED BY: Cain Saupe,  M.D.   12/09/2012  *RADIOLOGY REPORT*  Clinical Data:  Suspicious mass right breast and suspicious mass right axilla for biopsy.  ULTRASOUND GUIDED CORE BIOPSY OF THE right BREAST  Comparison: Previous exams.  I met with the patient and we discussed the procedure of ultrasound- guided biopsy, including benefits and alternatives.  We discussed the high likelihood of a successful procedure. We discussed the risks of the procedure, including infection, bleeding, tissue injury, clip migration, and inadequate sampling.  Informed written consent was given.  Using sterile technique  lidocaine, ultrasound guidance and a 14 gauge automated biopsy device, biopsy was performed of a 4.5 cm mass in the right breast 12 o'clock using a lateral approach.  At the conclusion of the procedure a  tissue marker clip was deployed into the biopsy cavity.  Follow up 2 view mammogram was not performed due to the patient's mobility limitations and due to the large size of the lesion.  Using sterile technique . lidocaine, ultrasound guidance and a 14 gauge automated biopsy device, biopsy was performed of  abnormal right axillary lymph node. using a .lateral. approach.  At the conclusion of the procedure no tissue marker clip was deployed into the biopsy cavity.  IMPRESSION: Ultrasound guided biopsy of right breast and right axilla.  No apparent complications.  Original Report Authenticated By: Sherian Rein, M.D.       IMPRESSION/PLAN: 2 79 year old with spinal lesions consistent with Multiple Myeloma, but she also has newly diagnosed Grade III node + right breast cancer. I spoke with radiology - confirmed that spinal disease is most consistent with myeloma on imaging. I also spoke with  an MD from Interventional Radiology to verifiy there is not a contraindication to proceed with RT urgently and consider kyphoplasty if  needed in the future. I plan to treat from T6 to L1  Which would address her most concerning levels of radiographic disease and still cover her low thoracic pain.  I advised her to take Dilaudid more frequently to control her pain - we can consider long acting pain meds if symptoms are refractory to Dilaudid. She and her son are enthusiastic to proceed. They understand that radiotherapy is palliative, not curative. Side effects can include fatigue, esophagitis, rare injury to internal organs.  I will simulate her today - with plans to start treatment on 12-15-12. Plan to deliver 18.75 Gy in 5 fractions.   I spent 55 minutes minutes face to face with the patient and more than 50% of that time was spent in counseling and/or coordination of care.    __________________________________________   Lonie Peak, MD

## 2012-12-13 NOTE — Progress Notes (Signed)
  Radiation Oncology         [phone] ________________________________  Name: Jacqueline Rivas MRN: [id number]  Date: 12/13/2012  DOB: Oct 02, 1932  SIMULATION AND TREATMENT PLANNING NOTE  Outpatient  DIAGNOSIS:   Multiple Myeloma with Thoracic Spine Disease  NARRATIVE:  The patient was brought to the CT Simulation planning suite.  Identity was confirmed.  All relevant records and images related to the planned course of therapy were reviewed.  The patient freely provided informed written consent to proceed with treatment after reviewing the details related to the planned course of therapy. The consent form was witnessed and verified by the simulation staff.    Then, the patient was set-up in a stable reproducible  supine position for radiation therapy with her legs in a VACLOCK, arms at sides.  CT images were obtained.  Surface markings were placed.  The CT images were loaded into the planning software.    TREATMENT PLANNING NOTE: Treatment planning then occurred.  The radiation prescription was entered and confirmed.    A total of 3 medically necessary complex treatment devices were fabricated - AP PA fields with MLCs for custom blocks, and VACLOCK. I have requested : 3D Simulation  I have requested a DVH of the following structures: lungs, heart, cord, CTV,kidneys.   Daily ConeBeam CT requested to allow tight margins around disease and decreased normal tissue exposure. The patient will receive 18.75 Gy in 5 fractions from T6-L1.   -----------------------------------  Lonie Peak, MD

## 2012-12-13 NOTE — Progress Notes (Deleted)
Here today for assessment for bone mets to her T- spine. Jacqueline Rivas reports minimal relief of her pain with Dilaudid. Pain in lower back and abd. Dilaudid dulls the pain and then she is able to turn in bed, but only sleeps about 4 hours. She reports that she is only voiding small amounts and has not had a BM in one week. She has noticed since christmas that voiding and defecation have changed. She was catheterized "a couple" of weeks ago with resulting volume of ~ "a cup". Once she got home from this visit she states she urinated a large amount ( uncontrolled).. Swelling of lower extremites, especially in her ankles and she winces whenever the right ankle touched.

## 2012-12-14 ENCOUNTER — Ambulatory Visit (HOSPITAL_COMMUNITY)
Admission: RE | Admit: 2012-12-14 | Discharge: 2012-12-14 | Disposition: A | Discharge status: Home / Self Care | Payer: Medicare Other | Source: Ambulatory Visit | Attending: Radiation Oncology | Admitting: Radiation Oncology

## 2012-12-14 ENCOUNTER — Encounter: Payer: Self-pay | Admitting: Radiation Oncology

## 2012-12-14 DIAGNOSIS — M7989 Other specified soft tissue disorders: Secondary | ICD-10-CM | POA: Diagnosis not present

## 2012-12-14 DIAGNOSIS — C9000 Multiple myeloma not having achieved remission: Secondary | ICD-10-CM | POA: Diagnosis not present

## 2012-12-14 DIAGNOSIS — IMO0002 Reserved for concepts with insufficient information to code with codable children: Secondary | ICD-10-CM | POA: Insufficient documentation

## 2012-12-14 DIAGNOSIS — X58XXXA Exposure to other specified factors, initial encounter: Secondary | ICD-10-CM | POA: Insufficient documentation

## 2012-12-14 DIAGNOSIS — Z9181 History of falling: Secondary | ICD-10-CM | POA: Diagnosis not present

## 2012-12-14 NOTE — Progress Notes (Signed)
VASCULAR LAB PRELIMINARY  PRELIMINARY  PRELIMINARY  PRELIMINARY  Bilateral lower extremity venous duplex completed.    Preliminary report:  Bilateral:  No evidence of DVTor superficial thrombosis. Results are consistent with a small right  ruptured Baker's cyst coursing from the popliteal fossa into the calf. There is no evidence of a left Baker's cyst   Jacqueline Rivas, RVS 12/14/2012, 4:00 PM

## 2012-12-15 ENCOUNTER — Ambulatory Visit (HOSPITAL_BASED_OUTPATIENT_CLINIC_OR_DEPARTMENT_OTHER): Payer: Medicare Other

## 2012-12-15 ENCOUNTER — Telehealth (INDEPENDENT_AMBULATORY_CARE_PROVIDER_SITE_OTHER): Payer: Self-pay

## 2012-12-15 ENCOUNTER — Other Ambulatory Visit (HOSPITAL_BASED_OUTPATIENT_CLINIC_OR_DEPARTMENT_OTHER): Payer: Medicare Other | Admitting: Lab

## 2012-12-15 ENCOUNTER — Ambulatory Visit (HOSPITAL_BASED_OUTPATIENT_CLINIC_OR_DEPARTMENT_OTHER): Payer: Medicare Other | Admitting: Hematology & Oncology

## 2012-12-15 ENCOUNTER — Other Ambulatory Visit: Payer: Self-pay | Admitting: Hematology & Oncology

## 2012-12-15 ENCOUNTER — Ambulatory Visit
Admission: RE | Admit: 2012-12-15 | Discharge: 2012-12-15 | Disposition: A | Discharge status: Home / Self Care | Payer: Medicare Other | Source: Ambulatory Visit | Attending: Radiation Oncology | Admitting: Radiation Oncology

## 2012-12-15 VITALS — BP 154/84 | HR 87 | Temp 97.9°F | Resp 16 | Ht 65.0 in | Wt 152.0 lb

## 2012-12-15 DIAGNOSIS — Z171 Estrogen receptor negative status [ER-]: Secondary | ICD-10-CM | POA: Diagnosis not present

## 2012-12-15 DIAGNOSIS — C9000 Multiple myeloma not having achieved remission: Secondary | ICD-10-CM

## 2012-12-15 DIAGNOSIS — C50919 Malignant neoplasm of unspecified site of unspecified female breast: Secondary | ICD-10-CM | POA: Diagnosis not present

## 2012-12-15 DIAGNOSIS — IMO0002 Reserved for concepts with insufficient information to code with codable children: Secondary | ICD-10-CM

## 2012-12-15 DIAGNOSIS — M549 Dorsalgia, unspecified: Secondary | ICD-10-CM

## 2012-12-15 DIAGNOSIS — C801 Malignant (primary) neoplasm, unspecified: Secondary | ICD-10-CM

## 2012-12-15 LAB — CMP (CANCER CENTER ONLY)
Alkaline Phosphatase: 88 U/L — ABNORMAL HIGH (ref 26–84)
CO2: 29 mEq/L (ref 18–33)
Creat: 0.7 mg/dl (ref 0.6–1.2)
Glucose, Bld: 125 mg/dL — ABNORMAL HIGH (ref 73–118)
Sodium: 136 mEq/L (ref 128–145)
Total Bilirubin: 0.6 mg/dl (ref 0.20–1.60)
Total Protein: 10.1 g/dL — ABNORMAL HIGH (ref 6.4–8.1)

## 2012-12-15 LAB — CBC WITH DIFFERENTIAL (CANCER CENTER ONLY)
BASO%: 0.9 % (ref 0.0–2.0)
Eosinophils Absolute: 0.1 10*3/uL (ref 0.0–0.5)
HGB: 10.7 g/dL — ABNORMAL LOW (ref 11.6–15.9)
LYMPH%: 17.3 % (ref 14.0–48.0)
MCHC: 33.5 g/dL (ref 32.0–36.0)
MONO#: 0.7 10*3/uL (ref 0.1–0.9)
MONO%: 10.3 % (ref 0.0–13.0)
NEUT#: 4.8 10*3/uL (ref 1.5–6.5)
NEUT%: 70.8 % (ref 39.6–80.0)
RBC: 3.21 10*6/uL — ABNORMAL LOW (ref 3.70–5.32)
WBC: 6.7 10*3/uL (ref 3.9–10.0)

## 2012-12-15 MED ORDER — DEXAMETHASONE 4 MG PO TABS: 4.0000 mg | ORAL_TABLET | Freq: Two times a day (BID) | ORAL | Status: DC

## 2012-12-15 MED ORDER — HYDROMORPHONE HCL PF 1 MG/ML IJ SOLN
2.0000 mg | INTRAMUSCULAR | Status: DC | PRN
  Administered 2012-12-15: 2 mg via INTRAVENOUS
  Filled 2012-12-15: qty 2

## 2012-12-15 MED ORDER — ZOLEDRONIC ACID 4 MG/100ML IV SOLN
4.0000 mg | Freq: Once | INTRAVENOUS | Status: DC
  Filled 2012-12-15: qty 100

## 2012-12-15 MED ORDER — ZOLEDRONIC ACID 4 MG/100ML IV SOLN
4.0000 mg | Freq: Once | INTRAVENOUS | Status: AC
Start: 2012-12-15 — End: 2012-12-15
  Administered 2012-12-15: 4 mg via INTRAVENOUS
  Filled 2012-12-15: qty 100

## 2012-12-15 MED ORDER — FAMCICLOVIR 250 MG PO TABS: ORAL_TABLET | ORAL | Status: DC

## 2012-12-15 MED ORDER — DEXAMETHASONE SODIUM PHOSPHATE 4 MG/ML IJ SOLN
40.0000 mg | Freq: Once | INTRAMUSCULAR | Status: AC
  Administered 2012-12-15: 40 mg via INTRAVENOUS

## 2012-12-15 MED ORDER — SODIUM CHLORIDE 0.9 % IV SOLN
Freq: Once | INTRAVENOUS | Status: AC
  Administered 2012-12-15: 16:00:00 via INTRAVENOUS

## 2012-12-15 MED ORDER — LANSOPRAZOLE 30 MG PO CPDR: 30.0000 mg | DELAYED_RELEASE_CAPSULE | Freq: Every day | ORAL | Status: DC

## 2012-12-15 MED ORDER — FENTANYL 12 MCG/HR TD PT72: 1.0000 | MEDICATED_PATCH | TRANSDERMAL | Status: DC

## 2012-12-15 NOTE — Patient Instructions (Signed)
Hydromorphone injection What is this medicine? HYDROMORPHONE (hye droe MOR fone) is a pain reliever. It is used to treat moderate to severe pain. This medicine may be used for other purposes; ask your health care provider or pharmacist if you have questions. What should I tell my health care provider before I take this medicine? They need to know if you have any of these conditions: -brain tumor -drug abuse or addiction -head injury -heart disease -frequently drink alcohol containing drinks -kidney disease -liver disease -lung disease, asthma, or breathing problems -an allergic or unusual reaction to hydromorphone, other opioid analgesics, latex, other medicines, foods, dyes, or preservatives -pregnant or trying to get pregnant -breast-feeding How should I use this medicine? This medicine is for injection into a vein, into a muscle, or under the skin. It is usually given by a health care professional in a hospital or clinic setting. If you get this medicine at home, you will be taught how to prepare and give this medicine. Use exactly as directed. Take your medicine at regular intervals. Do not take your medicine more often than directed. It is important that you put your used needles and syringes in a special sharps container. Do not put them in a trash can. If you do not have a sharps container, call your pharmacist or healthcare provider to get one. Talk to your pediatrician regarding the use of this medicine in children. This medicine is not approved for use in children. Overdosage: If you think you have taken too much of this medicine contact a poison control center or emergency room at once. NOTE: This medicine is only for you. Do not share this medicine with others. What if I miss a dose? If you miss a dose, use it as soon as you can. If it is almost time for your next dose, use only that dose. Do not use double or extra doses. What may interact with this  medicine? -alcohol -antihistamines for allergy, cough and cold -medicines for anesthesia -medicines for depression, anxiety, or psychotic disturbances -medicines for sleep -muscle relaxants -naltrexone -narcotic medicines (opiates) for pain -phenothiazines like chlorpromazine, mesoridazine, prochlorperazine, thioridazine -tramadol This list may not describe all possible interactions. Give your health care provider a list of all the medicines, herbs, non-prescription drugs, or dietary supplements you use. Also tell them if you smoke, drink alcohol, or use illegal drugs. Some items may interact with your medicine. What should I watch for while using this medicine? Tell your doctor or health care professional if your pain does not go away, if it gets worse, or if you have new or a different type of pain. You may develop tolerance to the medicine. Tolerance means that you will need a higher dose of the medicine for pain relief. Tolerance is normal and is expected if you take this medicine for a long time. Do not suddenly stop taking your medicine because you may develop a severe reaction. Your body becomes used to the medicine. This does NOT mean you are addicted. Addiction is a behavior related to getting and using a drug for a non-medical reason. If you have pain, you have a medical reason to take pain medicine. Your doctor will tell you how much medicine to take. If your doctor wants you to stop the medicine, the dose will be slowly lowered over time to avoid any side effects. You may get drowsy or dizzy. Do not drive, use machinery, or do anything that needs mental alertness until you know how this medicine  affects you. Do not stand or sit up quickly, especially if you are an older patient. This reduces the risk of dizzy or fainting spells. Alcohol may interfere with the effect of this medicine. Avoid alcoholic drinks. There are different types of narcotic medicines (opiates) for pain. If you take  more than one type at the same time, you may have more side effects. Give your health care provider a list of all medicines you use. Your doctor will tell you how much medicine to take. Do not take more medicine than directed. Call emergency for help if you have problems breathing. This medicine will cause constipation. Try to have a bowel movement at least every 2 to 3 days. If you do not have a bowel movement for 3 days, call your doctor or health care professional. Your mouth may get dry. Chewing sugarless gum or sucking hard candy, and drinking plenty of water may help. Contact your doctor if the problem does not go away or is severe. What side effects may I notice from receiving this medicine? Side effects that you should report to your doctor or health care professional as soon as possible: -allergic reactions like skin rash, itching or hives, swelling of the face, lips, or tongue -breathing problems -changes in vision -confusion -feeling faint or lightheaded, falls -seizures -slow or fast heartbeat -trouble passing urine or change in the amount of urine -trouble with balance, talking, walking Side effects that usually do not require medical attention (report to your doctor or health care professional if they continue or are bothersome): -difficulty sleeping -drowsiness -dry mouth -flushing -headache -itching -loss of appetite -nausea, vomiting This list may not describe all possible side effects. Call your doctor for medical advice about side effects. You may report side effects to FDA at 1-800-FDA-1088. Where should I keep my medicine? Keep out of the reach of children. This medicine can be abused. Keep your medicine in a safe place to protect it from theft. Do not share this medicine with anyone. Selling or giving away this medicine is dangerous and against the law. If you are using this medicine at home, you will be instructed on how to store this medicine. Throw away any unused  medicine after the expiration date on the label. Discard unused medicine and used packaging carefully. Pets and children can be harmed if they find used or lost packages. NOTE: This sheet is a summary. It may not cover all possible information. If you have questions about this medicine, talk to your doctor, pharmacist, or health care provider.  2013, [employer]. (06/12/2011 2:57:29 PM) Zoledronic Acid injection (Hypercalcemia, Oncology) What is this medicine? ZOLEDRONIC ACID (ZOE le dron ik AS id) lowers the amount of calcium loss from bone. It is used to treat too much calcium in your blood from cancer. It is also used to prevent complications of cancer that has spread to the bone. This medicine may be used for other purposes; ask your health care provider or pharmacist if you have questions. What should I tell my health care provider before I take this medicine? They need to know if you have any of these conditions: -aspirin-sensitive asthma -dental disease -kidney disease -an unusual or allergic reaction to zoledronic acid, other medicines, foods, dyes, or preservatives -pregnant or trying to get pregnant -breast-feeding How should I use this medicine? This medicine is for infusion into a vein. It is given by a health care professional in a hospital or clinic setting. Talk to your pediatrician regarding the  use of this medicine in children. Special care may be needed. Overdosage: If you think you have taken too much of this medicine contact a poison control center or emergency room at once. NOTE: This medicine is only for you. Do not share this medicine with others. What if I miss a dose? It is important not to miss your dose. Call your doctor or health care professional if you are unable to keep an appointment. What may interact with this medicine? -certain antibiotics given by injection -NSAIDs, medicines for pain and inflammation, like ibuprofen or naproxen -some diuretics  like bumetanide, furosemide -teriparatide -thalidomide This list may not describe all possible interactions. Give your health care provider a list of all the medicines, herbs, non-prescription drugs, or dietary supplements you use. Also tell them if you smoke, drink alcohol, or use illegal drugs. Some items may interact with your medicine. What should I watch for while using this medicine? Visit your doctor or health care professional for regular checkups. It may be some time before you see the benefit from this medicine. Do not stop taking your medicine unless your doctor tells you to. Your doctor may order blood tests or other tests to see how you are doing. Women should inform their doctor if they wish to become pregnant or think they might be pregnant. There is a potential for serious side effects to an unborn child. Talk to your health care professional or pharmacist for more information. You should make sure that you get enough calcium and vitamin D while you are taking this medicine. Discuss the foods you eat and the vitamins you take with your health care professional. Some people who take this medicine have severe bone, joint, and/or muscle pain. This medicine may also increase your risk for a broken thigh bone. Tell your doctor right away if you have pain in your upper leg or groin. Tell your doctor if you have any pain that does not go away or that gets worse. What side effects may I notice from receiving this medicine? Side effects that you should report to your doctor or health care professional as soon as possible: -allergic reactions like skin rash, itching or hives, swelling of the face, lips, or tongue -anxiety, confusion, or depression -breathing problems -changes in vision -feeling faint or lightheaded, falls -jaw burning, cramping, pain -muscle cramps, stiffness, or weakness -trouble passing urine or change in the amount of urine Side effects that usually do not require medical  attention (report to your doctor or health care professional if they continue or are bothersome): -bone, joint, or muscle pain -fever -hair loss -irritation at site where injected -loss of appetite -nausea, vomiting -stomach upset -tired This list may not describe all possible side effects. Call your doctor for medical advice about side effects. You may report side effects to FDA at 1-800-FDA-1088. Where should I keep my medicine? This drug is given in a hospital or clinic and will not be stored at home. NOTE: This sheet is a summary. It may not cover all possible information. If you have questions about this medicine, talk to your doctor, pharmacist, or health care provider.  2013, [employer] Standard. (04/03/2011 9:06:58 AM)

## 2012-12-15 NOTE — Telephone Encounter (Signed)
Called and left message for patient to call our office.  Re: check with see if patient if she would like to come in @ 10:00 am Friday 12/16/12 w/Dr. Johna Sheriff.

## 2012-12-15 NOTE — Progress Notes (Signed)
This office note has been dictated.

## 2012-12-16 ENCOUNTER — Encounter (INDEPENDENT_AMBULATORY_CARE_PROVIDER_SITE_OTHER): Payer: Self-pay | Admitting: General Surgery

## 2012-12-16 ENCOUNTER — Telehealth: Payer: Self-pay | Admitting: Hematology & Oncology

## 2012-12-16 ENCOUNTER — Ambulatory Visit (INDEPENDENT_AMBULATORY_CARE_PROVIDER_SITE_OTHER): Payer: Medicare Other | Admitting: General Surgery

## 2012-12-16 ENCOUNTER — Ambulatory Visit
Admission: RE | Admit: 2012-12-16 | Discharge: 2012-12-16 | Disposition: A | Discharge status: Home / Self Care | Payer: Medicare Other | Source: Ambulatory Visit | Attending: Radiation Oncology | Admitting: Radiation Oncology

## 2012-12-16 VITALS — BP 150/94 | HR 78 | Temp 96.2°F | Resp 18 | Ht 64.0 in | Wt 152.0 lb

## 2012-12-16 DIAGNOSIS — C50919 Malignant neoplasm of unspecified site of unspecified female breast: Secondary | ICD-10-CM | POA: Diagnosis not present

## 2012-12-16 DIAGNOSIS — C9000 Multiple myeloma not having achieved remission: Secondary | ICD-10-CM | POA: Diagnosis not present

## 2012-12-16 DIAGNOSIS — C50911 Malignant neoplasm of unspecified site of right female breast: Secondary | ICD-10-CM

## 2012-12-16 DIAGNOSIS — C773 Secondary and unspecified malignant neoplasm of axilla and upper limb lymph nodes: Secondary | ICD-10-CM

## 2012-12-16 NOTE — Progress Notes (Signed)
CC:   Jonita Albee, M.D.  DIAGNOSES: 1. IgA kappa myeloma. 2. Newly-diagnosed infiltrating ductal carcinoma of the right breast,     at least stage II, ER negative/PR negative/HER-2 positive. 3. Severe back pain secondary to compression fractures.  CURRENT THERAPY: 1. Patient to start Velcade weekly next week (3 weeks on and 1 week     off). 2. Decadron 40 mg IV monthly. 3. Zometa 4 mg IV monthly.  INTERIM HISTORY:  Ms. Yearwood comes in for followup.  Unfortunately, we have real problems with her now.  Shockingly enough, she had her mammogram.  This was done back on February 21st.  This showed a 4.8 x 3.8 x 4.2 cm mass in the right breast.  Also noted were some axillary lymph nodes.  Ultrasound confirmed this.  She did undergo a core biopsy.  The core biopsy report ([id number]) showed an infiltrating ductal carcinoma.  Shockingly enough, this was ER negative, PR negative and HER-2 positive.  She had an MRI of her spine.  She has multiple compression fractures. The worst are at T7 and T12.  I suspect that these likely are myelomatous fractures but with this diagnosis of breast cancer, we cannot be confident that this is not metastatic breast cancer.  She has really seen Interventional Radiology.  She will likely have kyphoplasty next week.  She has seen Radiation Oncology.  I think she has already started radiation.  I think this is reasonable as she is having a lot of pain issues.  She did have a bone marrow biopsy done.  The bone marrow biopsy report ([id number]) showed 30% plasma cells within the bone marrow.  FISH and cytogenetics are all pending.  This is truly a very complicated situation.  We have 2 malignancies. Given that the breast cancer is estrogen negative, this makes it much more difficult.  I thought for sure that given 79, the chances of her having an estrogen receptor-positive cancer would be high.  She will need chemotherapy for her breast cancer.   We need to treat the myeloma.  She has already gotten a dose of Zometa.  She will get another dose of Zometa today.  I will give her a dose of Decadron.  She is having the pain issues still.  I wrote for some fentanyl patch. She is on 12 mcg.  She is also on Dilaudid.  I do not see this in her medication record.  In anticipation of treating the myeloma, I have her on Famvir.  Her performance status is marginal.  I think if we are going to think about being aggressive with the breast cancer, we are going to have to try to get her feeling better.  I think the best way to do this is to get her back better with kyphoplasty and radiation and myeloma therapy.  When the interventional radiologists do her kyphoplasty, they will do biopsies of the bone to make sure that there is no breast cancer in there.  I am just very surprised as to the aggressiveness of this breast cancer.  PHYSICAL EXAMINATION:  General:  This is an elderly0appearing, somewhat frail white female in mild distress secondary to pain.  Vital signs: Temperature of 97.9, pulse 87, respiratory rate 16, blood pressure 154/84.  Weight is 152.  Head and neck:  Normocephalic, atraumatic skull.  There are no ocular or oral lesions.  There are no palpable cervical or supraclavicular lymph nodes.  Lungs:  Clear bilaterally. Cardiac:  Regular rate and  rhythm with a normal S1 and S2.  There are no murmurs, rubs or bruits.  Abdomen:  Soft with good bowel sounds.  There is no palpable abdominal mass.  There is no fluid wave.  No palpable hepatosplenomegaly.  Extremities:  Show some 1+ edema in her legs.  She has 4/5 strength in her legs.  Her laboratory studies today show a white cell count of 6.7, hemoglobin 10.7, hematocrit 31.9, platelet count 350.  Sodium is 136, potassium 4, calcium 9.7 with an albumin of 3.1.  Total protein is 10.1 with albumin of 3.1.  IMPRESSION:  Jacqueline Rivas is a 79 year old white female with 2  separate issues now.  She has IgA kappa myeloma.  We are going to start some systemic therapy on this.  She is going to need good local therapy to her back with kyphoplasty and radiation.  I think this will get her to be more functional.  As far as the breast cancer is concerned, we are going to have to see if she does have metastatic disease.  From the scans that she has had done so far, there are no obvious pulmonary or liver metastases.  I do not think a PET scan would help [place] as this would still be positive in the spine because of myeloma.  I think that the biopsies done with kyphoplasty will be quite informative.  I did give her a dose of Zometa today.  I also gave her dose of Decadron IV.  I did put her on some Decadron pills to take, which may help with some of the discomfort.  She got Prevacid also to help with stomach issues.  This is an incredibly complicated case.  This is going to take a while before she gets better.  I think 1 of the issues is whether not she is going to need up-front surgery for the right breast cancer or if we could go with systemic chemotherapy.  We will have to figure out when to get Ms. Calabria back to see [place].  She will start her Velcade next week.  We will try to have her come back to see [place] in a couple of weeks.  I spent about 45 minutes with her and her son today.    ______________________________ Josph Macho, M.D. PRE/MEDQ  D:  12/15/2012  T:  12/16/2012  Job:  [id number]

## 2012-12-16 NOTE — Progress Notes (Signed)
Simulation Verification Note - Thoracic Spine  The patient was brought to the treatment unit and placed in the planned treatment position. The clinical setup was verified. Then port films were obtained and uploaded to the radiation oncology medical record software.  The treatment beams were carefully compared against the planned radiation fields. The position location and shape of the radiation fields was reviewed. They targeted volume of tissue appears to be appropriately covered by the radiation beams. Organs at risk appear to be excluded as planned.  Based on my personal review, I approved the simulation verification. The patient's treatment will proceed as planned.  -----------------------------------  Lonie Peak, MD

## 2012-12-16 NOTE — Progress Notes (Signed)
Subjective:   new diagnosis right breast cancer  Patient ID: Jacqueline Rivas, female   DOB: Mar 26, 1932, 79 y.o.   MRN: [id number]  HPI Patient is a very pleasant 79 year old female referred by Dr. Juel Burrow at the breast center for a new diagnosis of right breast cancer. She is accompanied today by her son. She unfortunately has a recent diagnosis of multiple myeloma. She has a several month history of worsening back pain and imaging including MRI indicated probable malignancy. She subsequently has had a bone marrow biopsy done showing myeloma. She was diagnosed about 2 weeks ago. She has been seeing Dr. Okey Dupre of her for this. She currently is undergoing 10 radiation treatments to her back and then is planning to have a kyphoplasty and a biopsy will be performed at that time of her spinal lesion. She states that for a number of months she has had a right breast mass which she has ignored. It has gradually enlarged. She has no previous history of breast disease or biopsies. She actually has been in generally pretty good health until this current illness.  The patient was referred to the breast center. Mammogram and ultrasound revealed an approximately 4.8 cm mass at the 12:00 position of the right breast as well as an enlarged axillary lymph node. Subsequent ultrasound-guided biopsy was performed of both of these areas which have revealed grade 3 invasive mammary carcinoma, ER PR negative, HER-2/neu-positive with a proliferation marker of 69%. She is here for further evaluation. Her chief problem currently is back pain which has returned supine to a wheelchair and severely limits her mobility.  Past Medical History  Diagnosis Date  . Diverticulosis   . Hiatal hernia   . Hyperlipidemia   . Hypertension   . Breast mass   . Multiple myeloma   . Breast cancer    Past Surgical History  Procedure Laterality Date  . Tonsillectomy     Current Outpatient Prescriptions  Medication Sig Dispense Refill  . calcium  carbonate (OS-CAL - DOSED IN MG OF ELEMENTAL CALCIUM) 1250 MG tablet Take 1 tablet by mouth daily as needed (on occasion).      . Camphor-Eucalyptus-Menthol (VICKS VAPORUB) 4.7-1.2-2.6 % OINT Apply 1 application topically daily as needed (on occasion).      . Coenzyme Q10 (CO Q-10) 100 MG CAPS Take 10 mg by mouth daily as needed (on occasion).      Marland Kitchen dexamethasone (DECADRON) 4 MG tablet Take 1 tablet (4 mg total) by mouth 2 (two) times daily with a meal.  60 tablet  2  . famciclovir (FAMVIR) 250 MG tablet Take 1 a day to prevent shingles.  30 tablet  6  . fentaNYL (DURAGESIC) 12 MCG/HR Place 1 patch (12.5 mcg total) onto the skin every 3 (three) days.  10 patch  0  . lansoprazole (PREVACID) 30 MG capsule Take 1 capsule (30 mg total) by mouth daily.  30 capsule  4  . magnesium oxide (MAG-OX) 400 MG tablet Take 400 mg by mouth daily as needed (constipation).      . polyethylene glycol (MIRALAX / GLYCOLAX) packet Take 17 g by mouth 2 (two) times daily.      . Potassium 99 MG TABS Take 99 mg by mouth daily as needed (on occasion).      . Zinc 50 MG CAPS Take 50 mg by mouth daily as needed (on occasion).       No current facility-administered medications for this visit.   No Known Allergies History  Substance Use Topics  . Smoking status: Not on file  . Smokeless tobacco: Never Used  . Alcohol Use: No     Review of Systems  Constitutional: Positive for activity change and fatigue. Negative for fever and chills.  Respiratory: Negative for shortness of breath, wheezing and stridor.   Cardiovascular: Positive for leg swelling. Negative for chest pain and palpitations.  Gastrointestinal: Positive for constipation. Negative for nausea, vomiting, abdominal pain, blood in stool and abdominal distention.  Musculoskeletal: Positive for back pain and gait problem.       Objective:   Physical Exam BP 150/94  Pulse 78  Temp(Src) 96.2 F (35.7 C) (Temporal)  Resp 18  Ht 5\' 4"  (1.626 m)  Wt 152  lb (68.947 kg)  BMI 26.08 kg/m2 General: Elderly frail-appearing female in a wheelchair Skin: No rash or infection HEENT: No palpable masses or thyromegaly. Sclera nonicteric. Lymph nodes: No cervical or supraclavicular nodes palpable. There is at least one freely movable somewhat enlarged right axillary lymph node. No nodes palpable on the left. Breasts: There is a large, approximate 5 cm mass in the upper right breast that is freely movable. Small ptotic breasts bilaterally. No other masses palpable. Cardiovascular: 1-2+ lower extremity edema. Regular rate and rhythm. Lungs: Clear equal breath sounds without appreciable breathing Abdomen: Soft and not tender without discernible masses Neurologic: She is alert and fully oriented. Motor exam Limited    Assessment:     Recent diagnosis of multiple myeloma and breast cancer. I discussed the patient with Dr. Rosemarie Beath. It appears that her spinal disease is secondary to myeloma although that has not been proven. A biopsy will be performed at the completion of her radiotherapy at the time for kyphoplasty. If this is myeloma we did not have any evidence of distant metastases of her breast cancer clinically. I would recommend a modified mastectomy after completion of her radiation which we should be able to get healed quickly and that she could proceed with chemotherapy for her myeloma. This was discussed with the patient and her son today including the indications and a procedure and expected recovery. I will plan to see her back as soon as she gets her biopsy done for further treatment planning.    Plan:     Tentatively plan right modified mastectomy following results of her bone biopsy. I will see her back prior to surgery.

## 2012-12-16 NOTE — Patient Instructions (Signed)
Call [facility] [place] surgery, Christy or Dr. Johna Sheriff, for an appointment for several days after the bone biopsy is completed

## 2012-12-16 NOTE — Telephone Encounter (Signed)
Mailed March 2014 infusion appt schedule to patient's home.

## 2012-12-19 ENCOUNTER — Ambulatory Visit
Admission: RE | Admit: 2012-12-19 | Discharge: 2012-12-19 | Disposition: A | Discharge status: Home / Self Care | Payer: Medicare Other | Source: Ambulatory Visit | Attending: Radiation Oncology | Admitting: Radiation Oncology

## 2012-12-19 ENCOUNTER — Encounter: Payer: Self-pay | Admitting: Radiation Oncology

## 2012-12-19 VITALS — BP 147/84 | HR 80 | Temp 97.8°F | Wt 153.8 lb

## 2012-12-19 DIAGNOSIS — C9000 Multiple myeloma not having achieved remission: Secondary | ICD-10-CM | POA: Diagnosis not present

## 2012-12-19 NOTE — Progress Notes (Signed)
Reports that her pain is much better today, but unable to grade pain today.  Denies any constipation.    Travel by wheelchair.  Appears fatigued today. Bruising of face from prior fall is resolving.

## 2012-12-19 NOTE — Addendum Note (Signed)
Encounter addended by: Delynn Flavin, RN on: 12/19/2012  1:49 PM<BR>     Documentation filed: Vitals Section

## 2012-12-19 NOTE — Progress Notes (Addendum)
   Weekly Management Note:  outpatient Current Dose: 11.25 Gy  Projected Dose: 18.75 Gy   Narrative:  The patient presents for routine under treatment assessment.  CBCT/MVCT images/Port film x-rays were reviewed.  The chart was checked. Taking Decadron 4 mg twice a day. Taking fentanyl patches every 72 hours. Pain is much better. She saw Dr. Johna Sheriff last week, with plans for a possible mastectomy if tissue biopsy at vertebroplasty confirms myeloma. Scheduled to see interventional radiology in the next week. She denies esophagitis  Physical Findings:  BP- 147/84, pulse 80, temp 97.8.   Sitting in a wheelchair in no acute distress  Impression:  The patient is tolerating radiotherapy.  Plan:  Continue radiotherapy as planned. Gave the patient a Decadron taper plan:  Taper Dexamethasone to one 4 mg tablet only at breakfast only on Monday, March 10th.  On March 17th, Taper to half a tablet  (2mg ) at breakfast only.  On March 24th, Taper to half a tablet every other day - last dose on April 1st.  Will see her back in 1 mo.  ________________________________   Lonie Peak, M.D.

## 2012-12-19 NOTE — Patient Instructions (Addendum)
Taper Dexamethasone to one 4 mg tablet only at breakfast only on Monday, March 10th.  On March 17th, Taper to half a tablet  (2mg ) at breakfast only.  On March 24th, Taper to half a tablet every other day - last dose on April 1st.  You are receiving radiotherapy from T6 through L1 vertebraes.

## 2012-12-20 ENCOUNTER — Telehealth: Payer: Self-pay | Admitting: Hematology & Oncology

## 2012-12-20 ENCOUNTER — Ambulatory Visit
Admission: RE | Admit: 2012-12-20 | Discharge: 2012-12-20 | Disposition: A | Discharge status: Home / Self Care | Payer: Medicare Other | Source: Ambulatory Visit | Attending: Radiation Oncology | Admitting: Radiation Oncology

## 2012-12-20 ENCOUNTER — Ambulatory Visit (HOSPITAL_COMMUNITY)
Admission: RE | Admit: 2012-12-20 | Discharge: 2012-12-20 | Disposition: A | Discharge status: Home / Self Care | Payer: Medicare Other | Source: Ambulatory Visit | Attending: Hematology & Oncology | Admitting: Hematology & Oncology

## 2012-12-20 DIAGNOSIS — IMO0002 Reserved for concepts with insufficient information to code with codable children: Secondary | ICD-10-CM

## 2012-12-20 DIAGNOSIS — C9000 Multiple myeloma not having achieved remission: Secondary | ICD-10-CM | POA: Diagnosis not present

## 2012-12-20 DIAGNOSIS — C801 Malignant (primary) neoplasm, unspecified: Secondary | ICD-10-CM

## 2012-12-20 DIAGNOSIS — M8448XA Pathological fracture, other site, initial encounter for fracture: Secondary | ICD-10-CM | POA: Diagnosis not present

## 2012-12-20 NOTE — Telephone Encounter (Signed)
Per RN to cx 12/29/12 apt and resch for 12/28/12 due no provider in the office.  i called and gave son Arville Care) new apt date/time.

## 2012-12-21 ENCOUNTER — Ambulatory Visit
Admission: RE | Admit: 2012-12-21 | Discharge: 2012-12-21 | Disposition: A | Discharge status: Home / Self Care | Payer: Medicare Other | Source: Ambulatory Visit | Attending: Radiation Oncology | Admitting: Radiation Oncology

## 2012-12-21 ENCOUNTER — Encounter: Payer: Self-pay | Admitting: Radiation Oncology

## 2012-12-21 DIAGNOSIS — C9000 Multiple myeloma not having achieved remission: Secondary | ICD-10-CM | POA: Diagnosis not present

## 2012-12-22 ENCOUNTER — Ambulatory Visit (HOSPITAL_BASED_OUTPATIENT_CLINIC_OR_DEPARTMENT_OTHER): Payer: Medicare Other

## 2012-12-22 ENCOUNTER — Other Ambulatory Visit: Payer: Medicare Other

## 2012-12-22 ENCOUNTER — Other Ambulatory Visit (HOSPITAL_BASED_OUTPATIENT_CLINIC_OR_DEPARTMENT_OTHER): Payer: Medicare Other | Admitting: Lab

## 2012-12-22 VITALS — BP 171/89 | HR 87 | Temp 98.9°F | Resp 16

## 2012-12-22 DIAGNOSIS — Z5112 Encounter for antineoplastic immunotherapy: Secondary | ICD-10-CM

## 2012-12-22 DIAGNOSIS — C9000 Multiple myeloma not having achieved remission: Secondary | ICD-10-CM

## 2012-12-22 LAB — CBC WITH DIFFERENTIAL (CANCER CENTER ONLY)
BASO#: 0 10*3/uL (ref 0.0–0.2)
Eosinophils Absolute: 0 10*3/uL (ref 0.0–0.5)
HGB: 11.5 g/dL — ABNORMAL LOW (ref 11.6–15.9)
LYMPH%: 6.9 % — ABNORMAL LOW (ref 14.0–48.0)
MCH: 33.2 pg (ref 26.0–34.0)
MCV: 101 fL (ref 81–101)
MONO#: 0.3 10*3/uL (ref 0.1–0.9)
MONO%: 4.8 % (ref 0.0–13.0)
RBC: 3.46 10*6/uL — ABNORMAL LOW (ref 3.70–5.32)
WBC: 5.8 10*3/uL (ref 3.9–10.0)

## 2012-12-22 MED ORDER — BORTEZOMIB CHEMO SQ INJECTION 3.5 MG (2.5MG/ML)
1.3000 mg/m2 | Freq: Once | INTRAMUSCULAR | Status: AC
  Administered 2012-12-22: 2.25 mg via SUBCUTANEOUS
  Filled 2012-12-22: qty 2.25

## 2012-12-22 MED ORDER — ONDANSETRON HCL 8 MG PO TABS
8.0000 mg | ORAL_TABLET | Freq: Once | ORAL | Status: AC
  Administered 2012-12-22: 8 mg via ORAL

## 2012-12-22 NOTE — Patient Instructions (Signed)
[facility] Discharge Instructions for Patients Receiving Chemotherapy  Today you received the following chemotherapy agents Velcade  To help prevent nausea and vomiting after your treatment, we encourage you to take your nausea medication:  Compazine 10 mg - 1 tab every 6 hours as needed for mild to moderate nausea or vomiting   Zofran 8 mg - 1 tab every 12 hours as needed for moderate to severe nausea or vomiting    Colace - this is a stool softener. Take 100mg capsule 2-12 times a day as needed. If you have to take more than 6 capsules of Colace a day call the Cancer Center.  Senna - this is a mild laxative used to treat mild constipation. May take 2 tabs by mouth daily or up to twice a day as needed for mild constipation   Milk of Magnesia - this is a laxative used to treat moderate to severe constipation. May take 2-4 tablespoons every 8 hours as needed. May increase to 8 tablespoons x 1 dose and if no bowel movement call the Cancer Center     If you develop nausea and vomiting that is not controlled by your nausea medication, call the clinic. If it is after clinic hours your family physician or the after hours number for the clinic or go to the Emergency Department.   BELOW ARE SYMPTOMS THAT SHOULD BE REPORTED IMMEDIATELY:  *FEVER GREATER THAN 100.5 F  *CHILLS WITH OR WITHOUT FEVER  NAUSEA AND VOMITING THAT IS NOT CONTROLLED WITH YOUR NAUSEA MEDICATION  *UNUSUAL SHORTNESS OF BREATH  *UNUSUAL BRUISING OR BLEEDING  TENDERNESS IN MOUTH AND THROAT WITH OR WITHOUT PRESENCE OF ULCERS  *URINARY PROBLEMS  *BOWEL PROBLEMS  UNUSUAL RASH Items with * indicate a potential emergency and should be followed up as soon as possible.  One of the nurses will contact you 24 hours after your treatment. Please let the nurse know about any problems that you may have experienced. Feel free to call the clinic you have any questions or concerns. The clinic phone number is [phone]  [phone].   I have been informed and understand all the instructions given to me. I know to contact the clinic, my physician, or go to the Emergency Department if any problems should occur. I do not have any questions at this time, but understand that I may call the clinic during office hours   should I have any questions or need assistance in obtaining follow up care.    __________________________________________  _____________  __________ Signature of Patient or Authorized Representative            Date                   Time    __________________________________________ Nurse's Signature    

## 2012-12-22 NOTE — Progress Notes (Signed)
  Radiation Oncology         [phone] ________________________________  Name: Jacqueline Rivas MRN: [id number]  Date: 12/21/2012  DOB: Jan 22, 1932  End of Treatment Note  Diagnosis:  Spinal lesions consistent with Multiple Myeloma  Indication for treatment:  palliative       Radiation treatment dates:   12/15/2012-12/21/2012  Site/dose:   T6-L1 / 18.75 Gy in 5 fractions  Beams/energy:   AP/PA   / photons  Narrative: The patient tolerated radiation treatment relatively well.   Pain improved significantly, which in part could have been due to starting fentanyl, but certainly could be from radiotherapy as well.  Plan: The patient has completed radiation treatment. The patient will return to radiation oncology clinic for routine followup in one month. I advised them to call or return sooner if they have any questions or concerns related to their recovery or treatment.  Gave the patient a Decadron taper plan:  Taper Dexamethasone to one 4 mg tablet only at breakfast only on Monday, March 10th.  On March 17th, Taper to half a tablet (2mg ) at breakfast only.  On March 24th, Taper to half a tablet every other day - last dose on April 1st.   -----------------------------------  Lonie Peak, MD

## 2012-12-23 ENCOUNTER — Other Ambulatory Visit (HOSPITAL_COMMUNITY): Payer: Self-pay | Admitting: Interventional Radiology

## 2012-12-23 DIAGNOSIS — IMO0002 Reserved for concepts with insufficient information to code with codable children: Secondary | ICD-10-CM

## 2012-12-23 DIAGNOSIS — C801 Malignant (primary) neoplasm, unspecified: Secondary | ICD-10-CM

## 2012-12-28 ENCOUNTER — Other Ambulatory Visit: Payer: Self-pay | Admitting: Medical

## 2012-12-28 ENCOUNTER — Other Ambulatory Visit: Payer: Self-pay | Admitting: Radiology

## 2012-12-28 ENCOUNTER — Other Ambulatory Visit (HOSPITAL_BASED_OUTPATIENT_CLINIC_OR_DEPARTMENT_OTHER): Payer: Medicare Other | Admitting: Lab

## 2012-12-28 ENCOUNTER — Ambulatory Visit (HOSPITAL_BASED_OUTPATIENT_CLINIC_OR_DEPARTMENT_OTHER): Payer: Medicare Other

## 2012-12-28 ENCOUNTER — Other Ambulatory Visit: Payer: Self-pay | Admitting: Oncology

## 2012-12-28 VITALS — BP 157/82 | HR 99 | Temp 97.6°F | Resp 18

## 2012-12-28 DIAGNOSIS — C9000 Multiple myeloma not having achieved remission: Secondary | ICD-10-CM

## 2012-12-28 DIAGNOSIS — Z5112 Encounter for antineoplastic immunotherapy: Secondary | ICD-10-CM

## 2012-12-28 LAB — CBC WITH DIFFERENTIAL (CANCER CENTER ONLY)
Eosinophils Absolute: 0 10*3/uL (ref 0.0–0.5)
HCT: 34.6 % — ABNORMAL LOW (ref 34.8–46.6)
LYMPH%: 4.4 % — ABNORMAL LOW (ref 14.0–48.0)
MCH: 32.9 pg (ref 26.0–34.0)
MCV: 101 fL (ref 81–101)
MONO#: 0.3 10*3/uL (ref 0.1–0.9)
MONO%: 4.2 % (ref 0.0–13.0)
NEUT%: 90.6 % — ABNORMAL HIGH (ref 39.6–80.0)
Platelets: 212 10*3/uL (ref 145–400)
RBC: 3.43 10*6/uL — ABNORMAL LOW (ref 3.70–5.32)
RDW: 15.6 % (ref 11.1–15.7)
WBC: 8.2 10*3/uL (ref 3.9–10.0)

## 2012-12-28 MED ORDER — ONDANSETRON HCL 8 MG PO TABS
8.0000 mg | ORAL_TABLET | Freq: Once | ORAL | Status: AC
  Administered 2012-12-28: 8 mg via ORAL

## 2012-12-28 MED ORDER — SODIUM CHLORIDE 0.9 % IV SOLN
INTRAVENOUS | Status: DC
  Administered 2012-12-28: 10:00:00 via INTRAVENOUS

## 2012-12-28 MED ORDER — HYDROMORPHONE HCL PF 4 MG/ML IJ SOLN: 2.0000 mg | INTRAMUSCULAR | Status: DC | PRN

## 2012-12-28 MED ORDER — BORTEZOMIB CHEMO SQ INJECTION 3.5 MG (2.5MG/ML)
1.3000 mg/m2 | Freq: Once | INTRAMUSCULAR | Status: AC
  Administered 2012-12-28: 2.25 mg via SUBCUTANEOUS
  Filled 2012-12-28: qty 2.25

## 2012-12-28 MED ORDER — HYDROMORPHONE HCL PF 2 MG/ML IJ SOLN
2.0000 mg | Freq: Once | INTRAMUSCULAR | Status: AC
  Administered 2012-12-28: 2 mg via INTRAVENOUS

## 2012-12-28 MED ORDER — HYDROMORPHONE HCL 2 MG PO TABS: 2.0000 mg | ORAL_TABLET | ORAL | Status: DC | PRN

## 2012-12-28 NOTE — Telephone Encounter (Signed)
While patient here receiving chemo, request refill of Dilaudid. Teola Bradley, [employer]

## 2012-12-28 NOTE — Patient Instructions (Addendum)
[facility] Discharge Instructions for Patients Receiving Chemotherapy  Today you received the following chemotherapy agents Velcade  To help prevent nausea and vomiting after your treatment, we encourage you to take your nausea medication:  Compazine 10 mg - 1 tab every 6 hours as needed for mild to moderate nausea or vomiting   Zofran 8 mg - 1 tab every 12 hours as needed for moderate to severe nausea or vomiting    Colace - this is a stool softener. Take 100mg  capsule 2-12 times a day as needed. If you have to take more than 6 capsules of Colace a day call the Cancer Center.  Senna - this is a mild laxative used to treat mild constipation. May take 2 tabs by mouth daily or up to twice a day as needed for mild constipation   Milk of Magnesia - this is a laxative used to treat moderate to severe constipation. May take 2-4 tablespoons every 8 hours as needed. May increase to 8 tablespoons x 1 dose and if no bowel movement call the Cancer Center     If you develop nausea and vomiting that is not controlled by your nausea medication, call the clinic. If it is after clinic hours your family physician or the after hours number for the clinic or go to the Emergency Department.   BELOW ARE SYMPTOMS THAT SHOULD BE REPORTED IMMEDIATELY:  *FEVER GREATER THAN 100.5 F  *CHILLS WITH OR WITHOUT FEVER  NAUSEA AND VOMITING THAT IS NOT CONTROLLED WITH YOUR NAUSEA MEDICATION  *UNUSUAL SHORTNESS OF BREATH  *UNUSUAL BRUISING OR BLEEDING  TENDERNESS IN MOUTH AND THROAT WITH OR WITHOUT PRESENCE OF ULCERS  *URINARY PROBLEMS  *BOWEL PROBLEMS  UNUSUAL RASH Items with * indicate a potential emergency and should be followed up as soon as possible.  One of the nurses will contact you 24 hours after your treatment. Please let the nurse know about any problems that you may have experienced. Feel free to call the clinic you have any questions or concerns. The clinic phone number is [phone]  [phone].   I have been informed and understand all the instructions given to me. I know to contact the clinic, my physician, or go to the Emergency Department if any problems should occur. I do not have any questions at this time, but understand that I may call the clinic during office hours   should I have any questions or need assistance in obtaining follow up care.    __________________________________________  _____________  __________ Signature of Patient or Authorized Representative            Date                   Time    __________________________________________ Nurse's Signature

## 2012-12-29 ENCOUNTER — Ambulatory Visit: Payer: Medicare Other

## 2012-12-29 ENCOUNTER — Other Ambulatory Visit: Payer: Medicare Other | Admitting: Lab

## 2012-12-30 ENCOUNTER — Telehealth (HOSPITAL_COMMUNITY): Payer: Self-pay

## 2013-01-02 ENCOUNTER — Encounter (HOSPITAL_COMMUNITY): Payer: Self-pay | Admitting: Pharmacy Technician

## 2013-01-02 ENCOUNTER — Ambulatory Visit (HOSPITAL_COMMUNITY): Admission: RE | Admit: 2013-01-02 | Payer: Medicare Other | Source: Ambulatory Visit

## 2013-01-04 ENCOUNTER — Encounter (HOSPITAL_COMMUNITY): Payer: Self-pay

## 2013-01-04 ENCOUNTER — Ambulatory Visit: Payer: Medicare Other | Admitting: Internal Medicine

## 2013-01-04 ENCOUNTER — Other Ambulatory Visit (HOSPITAL_COMMUNITY): Payer: Self-pay | Admitting: Interventional Radiology

## 2013-01-04 ENCOUNTER — Encounter: Payer: Self-pay | Admitting: Hematology & Oncology

## 2013-01-04 ENCOUNTER — Ambulatory Visit (HOSPITAL_COMMUNITY)
Admission: RE | Admit: 2013-01-04 | Discharge: 2013-01-04 | Disposition: A | Discharge status: Home / Self Care | Payer: Medicare Other | Source: Ambulatory Visit | Attending: Interventional Radiology | Admitting: Interventional Radiology

## 2013-01-04 DIAGNOSIS — C801 Malignant (primary) neoplasm, unspecified: Secondary | ICD-10-CM

## 2013-01-04 DIAGNOSIS — IMO0002 Reserved for concepts with insufficient information to code with codable children: Secondary | ICD-10-CM | POA: Diagnosis not present

## 2013-01-04 DIAGNOSIS — I10 Essential (primary) hypertension: Secondary | ICD-10-CM | POA: Insufficient documentation

## 2013-01-04 DIAGNOSIS — E785 Hyperlipidemia, unspecified: Secondary | ICD-10-CM | POA: Insufficient documentation

## 2013-01-04 DIAGNOSIS — C50919 Malignant neoplasm of unspecified site of unspecified female breast: Secondary | ICD-10-CM | POA: Diagnosis not present

## 2013-01-04 DIAGNOSIS — C9000 Multiple myeloma not having achieved remission: Secondary | ICD-10-CM | POA: Diagnosis not present

## 2013-01-04 DIAGNOSIS — D72822 Plasmacytosis: Secondary | ICD-10-CM | POA: Diagnosis not present

## 2013-01-04 DIAGNOSIS — Z79899 Other long term (current) drug therapy: Secondary | ICD-10-CM | POA: Insufficient documentation

## 2013-01-04 DIAGNOSIS — M8448XA Pathological fracture, other site, initial encounter for fracture: Secondary | ICD-10-CM | POA: Insufficient documentation

## 2013-01-04 LAB — COMPREHENSIVE METABOLIC PANEL
ALT: 59 U/L — ABNORMAL HIGH (ref 0–35)
Alkaline Phosphatase: 100 U/L (ref 39–117)
BUN: 9 mg/dL (ref 6–23)
CO2: 26 mEq/L (ref 19–32)
Calcium: 9.9 mg/dL (ref 8.4–10.5)
GFR calc Af Amer: >90 mL/min (ref >90)
GFR calc non Af Amer: 89 mL/min — ABNORMAL LOW (ref >90)
Glucose, Bld: 105 mg/dL — ABNORMAL HIGH (ref 70–99)
Sodium: 129 mEq/L — ABNORMAL LOW (ref 135–145)
Total Protein: 8.3 g/dL (ref 6.0–8.3)

## 2013-01-04 LAB — CBC WITH DIFFERENTIAL/PLATELET
Eosinophils Absolute: 0 10*3/uL (ref 0.0–0.7)
Eosinophils Relative: 1 % (ref 0–5)
HCT: 32.6 % — ABNORMAL LOW (ref 36.0–46.0)
Hemoglobin: 11.1 g/dL — ABNORMAL LOW (ref 12.0–15.0)
Lymphocytes Relative: 14 % (ref 12–46)
Lymphs Abs: 0.6 10*3/uL — ABNORMAL LOW (ref 0.7–4.0)
MCH: 33.3 pg (ref 26.0–34.0)
MCV: 97.9 fL (ref 78.0–100.0)
Monocytes Relative: 9 % (ref 3–12)
RBC: 3.33 MIL/uL — ABNORMAL LOW (ref 3.87–5.11)
WBC: 4.3 10*3/uL (ref 4.0–10.5)

## 2013-01-04 LAB — PROTIME-INR: Prothrombin Time: 13.1 seconds (ref 11.6–15.2)

## 2013-01-04 MED ORDER — MIDAZOLAM HCL 2 MG/2ML IJ SOLN
INTRAMUSCULAR | Status: AC
  Filled 2013-01-04: qty 4

## 2013-01-04 MED ORDER — CEFAZOLIN SODIUM 1-5 GM-% IV SOLN
1.0000 g | Freq: Once | INTRAVENOUS | Status: AC
  Administered 2013-01-04: 1 g via INTRAVENOUS

## 2013-01-04 MED ORDER — CEFAZOLIN SODIUM 1-5 GM-% IV SOLN
INTRAVENOUS | Status: AC
  Filled 2013-01-04: qty 50

## 2013-01-04 MED ORDER — SODIUM CHLORIDE 0.9 % IV SOLN: Freq: Once | INTRAVENOUS | Status: DC

## 2013-01-04 MED ORDER — IOHEXOL 300 MG/ML  SOLN
50.0000 mL | Freq: Once | INTRAMUSCULAR | Status: AC | PRN
  Administered 2013-01-04: 1 mL

## 2013-01-04 MED ORDER — FENTANYL CITRATE 0.05 MG/ML IJ SOLN
INTRAMUSCULAR | Status: AC
  Filled 2013-01-04: qty 4

## 2013-01-04 MED ORDER — FENTANYL CITRATE 0.05 MG/ML IJ SOLN
INTRAMUSCULAR | Status: AC | PRN
  Administered 2013-01-04: 25 ug via INTRAVENOUS
  Administered 2013-01-04: 50 ug via INTRAVENOUS
  Administered 2013-01-04: 25 ug via INTRAVENOUS

## 2013-01-04 MED ORDER — HYDROMORPHONE HCL PF 1 MG/ML IJ SOLN
INTRAMUSCULAR | Status: AC | PRN
  Administered 2013-01-04: 1 mg

## 2013-01-04 MED ORDER — MIDAZOLAM HCL 2 MG/2ML IJ SOLN
INTRAMUSCULAR | Status: AC | PRN
  Administered 2013-01-04 (×3): 1 mg via INTRAVENOUS

## 2013-01-04 MED ORDER — SODIUM CHLORIDE 0.9 % IV SOLN: INTRAVENOUS | Status: AC

## 2013-01-04 MED ORDER — TOBRAMYCIN SULFATE 1.2 G IJ SOLR
INTRAMUSCULAR | Status: AC
  Filled 2013-01-04: qty 1.2

## 2013-01-04 MED ORDER — HYDROMORPHONE HCL PF 1 MG/ML IJ SOLN
INTRAMUSCULAR | Status: AC
  Filled 2013-01-04: qty 2

## 2013-01-04 NOTE — ED Notes (Signed)
Pt rates back pain  4/10. Pre procedure it was 10/10. Duragesic patch in place

## 2013-01-04 NOTE — Procedures (Signed)
S/P T 11 radiofrequency ablation followed by augmentation for painful pathological compression fracture .

## 2013-01-04 NOTE — ED Notes (Signed)
Requested short stay recovery bed for remainder of recovery time.  Spoke with [employer].

## 2013-01-04 NOTE — H&P (Signed)
Chief complaint: "I'm here for my back procedure"  HPI: Jacqueline Rivas is a pleasant 79 year old female who was referred by Dr. Myna Hidalgo for back pain related to pathologic compression fractures.  She was recently diagnosed with myeloma by a bone marrow biopsy as well as recent breast cancer.  She has had ongoing back pain for the past several months and her work up now includes an MRI of the thoracic and lumbar spine finding significant pathological compression fractures at multiple levels.  The patient describes the majority of her pain in the mid and low back, 10/10, without the use of pain medications.  She has currently been on Duragesic patch as well as Dilaudid pain pills which have alleviated some of the pain.  Radiation therapy has helped as well and she has had 3 of 5 treatments so far.  She denies any bowel or bladder dysfunction but does have some pain radiating around to the anterior abdomen at times.    Prior to the onset of all these symptoms, the patient was quite active and was able to ambulate and do most of her daily activities without difficulty or pain.  She certainly has had a lot of difficulty since the onset of these symptoms, predominantly with rising and sitting as well as with ambulating.  When she is at rest or sitting in a chair, she can find a comfortable position although the pain is not completely relieved.  Past medical history has been reviewed as well as current medications.  The patient denies any recent infectious issues that had not been treated.  She is taking acyclovir for recent shingles of her right thigh but does not have any open or weeping blisters at this time. She also completed a course of antibiotics for urinary tract infection about 1 month ago.  Past medical hx:  Marland Kitchen  Diverticulosis    .  Myeloma    .  Hyperlipidemia    .  Hypertension    .  Breast mass, cancer   Surgical Hx: Marland Kitchen  Tonsillectomy     Social Hx: .  Prostate cancer  Father    .  Heart attack   Brother    .  Asthma  Maternal Grandmother     Social History: reports that she has never smoked. She does not have any smokeless tobacco history on file. She reports that drinks alcohol. She reports that she does not use illicit drugs.   Allergies: No Known Allergies   Current outpatient prescriptions: calcium carbonate (OS-CAL - DOSED IN MG OF ELEMENTAL CALCIUM) 1250 MG tablet, Take 1 tablet by mouth daily as needed (on occasion). Camphor-Eucalyptus-Menthol (VICKS VAPORUB) 4.7-1.2-2.6 % OINT, Apply 1 application topically daily as needed (on occasion) Coenzyme Q10 (CO Q-10) 100 MG CAPS, Take 10 mg by mouth daily as needed (on occasion) magnesium oxide (MAG-OX) 400 MG tablet, Take 400 mg by mouth daily as needed (constipation).,  polyethylene glycol (MIRALAX / GLYCOLAX) packet, Take 17 g by mouth 2 (two) times daily valACYclovir (VALTREX) 1000 MG tablet, Take 1 tablet (1,000 mg total) by mouth 3 (three) times daily Fentanyl Patch q72 hr Decadron 4mg  daily   Review of Systems  Constitutional: Negative for fever and chills.  Respiratory: Negative for cough and shortness of breath.  Cardiovascular: Negative for chest pain.  Gastrointestinal: Negative for nausea and vomiting.  Musculoskeletal: Positive for back pain.  Neurological: Negative for headaches.  Endo/Heme/Allergies: Does not bruise/bleed easily.    Physical Exam: BP 160/86  Pulse 89  Temp(Src) 98.1 F (36.7 C) (Oral)  Resp 16  Ht 5\' 4"  (1.626 m)  Wt 150 lb (68.04 kg)  BMI 25.73 kg/m2  SpO2 96%  Constitutional: She is oriented to person, place, and time. She appears well-developed and well-nourished.  Cardiovascular: Normal rate and regular rhythm.  Respiratory: Effort normal.  Few right basilar crackles  GI: Soft. Bowel sounds are normal. There is no tenderness.  Back: Palpable tenderness without deformity at the lower thoracic area, approximately T11 and T12 region. Musculoskeletal: Normal range of motion.  She exhibits edema of right lower ext.  Neurological: She is alert and oriented to person, place, and time.  Imaging: MRI Thoracic and Lumbar Spines IMAGING:  1. Diffuse infiltration of the thoracic spine compatible with  history of multiple myeloma and pathologic compression fractures.  Extraosseous extension of tumor from T10-T12 and invasion of the  central canal with moderate to severe central stenosis due to both  pathologic compression fracture and extraosseous extension of  tumor.  2. Acute/ subacute T11 compression fracture with 25% loss of  vertebral body height and no retropulsion.  3. Further collapse of T12 compression fracture with retropulsion  central stenosis.  4. Subacute to chronic T5 and T7 compression fractures. 5. Diffuse abnormal marrow signal compatible with history multiple  myeloma. Lumbosacral transitional anatomy with six lumbar type  vertebral bodies. Lumbar spondylosis without lumbar compression  fractures.   Assessment and Plan: The patient has symptomatic, pathologic compression fractures at T11 and T12 with significant collapse at the T12 level.  Long discussion detailing treatment options with the patient and her son was explained.  Although the fracture at the T7 level is possibly acute or sub acute, she is not symptomatic at that level.  Therefore, we have decided we will treat T11 with biopsy and ablation prior to vertebroplasty as well as vertebroplasty of the T12 compression fracture.  The procedure, including risks and complications, was explained to the patient and her son who agreed to proceed for the purpose of symptomatic relief. We reviewed the procedure, risks, complications, and expectations after procedure. Labs pending. Consent signed in chart.  Barbee Shropshire [facility] 01/04/2013 10:18 AM

## 2013-01-05 ENCOUNTER — Other Ambulatory Visit (HOSPITAL_BASED_OUTPATIENT_CLINIC_OR_DEPARTMENT_OTHER): Payer: Medicare Other | Admitting: Lab

## 2013-01-05 ENCOUNTER — Ambulatory Visit (HOSPITAL_BASED_OUTPATIENT_CLINIC_OR_DEPARTMENT_OTHER): Payer: Medicare Other

## 2013-01-05 VITALS — BP 158/85 | HR 105 | Temp 97.8°F | Resp 20

## 2013-01-05 DIAGNOSIS — C9000 Multiple myeloma not having achieved remission: Secondary | ICD-10-CM | POA: Diagnosis not present

## 2013-01-05 DIAGNOSIS — Z5112 Encounter for antineoplastic immunotherapy: Secondary | ICD-10-CM | POA: Diagnosis not present

## 2013-01-05 LAB — CBC WITH DIFFERENTIAL (CANCER CENTER ONLY)
BASO%: 0.6 % (ref 0.0–2.0)
EOS%: 0.2 % (ref 0.0–7.0)
LYMPH%: 3.3 % — ABNORMAL LOW (ref 14.0–48.0)
MCH: 33.1 pg (ref 26.0–34.0)
MCV: 99 fL (ref 81–101)
MONO%: 5.4 % (ref 0.0–13.0)
NEUT#: 4.9 10*3/uL (ref 1.5–6.5)
Platelets: 197 10*3/uL (ref 145–400)
RBC: 3.5 10*6/uL — ABNORMAL LOW (ref 3.70–5.32)
RDW: 15.1 % (ref 11.1–15.7)

## 2013-01-05 MED ORDER — BORTEZOMIB CHEMO SQ INJECTION 3.5 MG (2.5MG/ML)
1.3000 mg/m2 | Freq: Once | INTRAMUSCULAR | Status: AC
  Administered 2013-01-05: 2.25 mg via SUBCUTANEOUS
  Filled 2013-01-05: qty 2.25

## 2013-01-05 MED ORDER — ONDANSETRON HCL 8 MG PO TABS
8.0000 mg | ORAL_TABLET | Freq: Once | ORAL | Status: AC
  Administered 2013-01-05: 8 mg via ORAL

## 2013-01-05 NOTE — Patient Instructions (Addendum)

## 2013-01-10 ENCOUNTER — Encounter: Payer: Self-pay | Admitting: Hematology & Oncology

## 2013-01-10 NOTE — Progress Notes (Signed)
Patient's son Arville Care called and to see if the results from 3/19//14 are in yet and what are the next steps Dr. Myna Hidalgo would be taking with his mom. He wants call back [phone]. He is listed as emerg contact for patient.  I verified patient's date of birth only.

## 2013-01-12 ENCOUNTER — Ambulatory Visit: Payer: Medicare Other

## 2013-01-12 ENCOUNTER — Other Ambulatory Visit: Payer: Self-pay | Admitting: *Deleted

## 2013-01-12 ENCOUNTER — Other Ambulatory Visit: Payer: Medicare Other | Admitting: Lab

## 2013-01-12 DIAGNOSIS — C9000 Multiple myeloma not having achieved remission: Secondary | ICD-10-CM

## 2013-01-12 MED ORDER — FENTANYL 12 MCG/HR TD PT72: 1.0000 | MEDICATED_PATCH | TRANSDERMAL | Status: DC

## 2013-01-12 NOTE — Telephone Encounter (Signed)
Received a call from pt's son Lapage requesting a Fentanyl patch refill.  Last provided on 12/15/12/. Routed to Eunice Blase, [place] for approval & signature. Then will place at the front desk for pick up.

## 2013-01-16 ENCOUNTER — Telehealth (INDEPENDENT_AMBULATORY_CARE_PROVIDER_SITE_OTHER): Payer: Self-pay | Admitting: General Surgery

## 2013-01-16 ENCOUNTER — Other Ambulatory Visit (INDEPENDENT_AMBULATORY_CARE_PROVIDER_SITE_OTHER): Payer: Self-pay | Admitting: General Surgery

## 2013-01-16 NOTE — Telephone Encounter (Signed)
I discussed with the patient plans to proceed with a modified mastectomy. She is in agreement and asked that we schedule this through her son.

## 2013-01-18 ENCOUNTER — Encounter (HOSPITAL_COMMUNITY): Payer: Self-pay | Admitting: Pharmacy Technician

## 2013-01-19 ENCOUNTER — Ambulatory Visit (HOSPITAL_BASED_OUTPATIENT_CLINIC_OR_DEPARTMENT_OTHER): Payer: Medicare Other

## 2013-01-19 ENCOUNTER — Ambulatory Visit (HOSPITAL_BASED_OUTPATIENT_CLINIC_OR_DEPARTMENT_OTHER): Payer: Medicare Other | Admitting: Hematology & Oncology

## 2013-01-19 ENCOUNTER — Ambulatory Visit (HOSPITAL_BASED_OUTPATIENT_CLINIC_OR_DEPARTMENT_OTHER): Payer: Medicare Other | Admitting: Lab

## 2013-01-19 VITALS — BP 165/93 | HR 80 | Temp 97.8°F | Resp 18 | Ht 64.0 in | Wt 158.0 lb

## 2013-01-19 DIAGNOSIS — C50919 Malignant neoplasm of unspecified site of unspecified female breast: Secondary | ICD-10-CM | POA: Diagnosis not present

## 2013-01-19 DIAGNOSIS — R609 Edema, unspecified: Secondary | ICD-10-CM | POA: Diagnosis not present

## 2013-01-19 DIAGNOSIS — Z171 Estrogen receptor negative status [ER-]: Secondary | ICD-10-CM

## 2013-01-19 DIAGNOSIS — C9000 Multiple myeloma not having achieved remission: Secondary | ICD-10-CM

## 2013-01-19 DIAGNOSIS — C50911 Malignant neoplasm of unspecified site of right female breast: Secondary | ICD-10-CM

## 2013-01-19 LAB — CMP (CANCER CENTER ONLY)
Alkaline Phosphatase: 102 U/L — ABNORMAL HIGH (ref 26–84)
BUN, Bld: 10 mg/dL (ref 7–22)
Creat: 0.5 mg/dl — ABNORMAL LOW (ref 0.6–1.2)
Glucose, Bld: 92 mg/dL (ref 73–118)
Total Bilirubin: 0.7 mg/dl (ref 0.20–1.60)

## 2013-01-19 LAB — CBC WITH DIFFERENTIAL (CANCER CENTER ONLY)
BASO#: 0.1 10*3/uL (ref 0.0–0.2)
BASO%: 0.6 % (ref 0.0–2.0)
EOS%: 0.5 % (ref 0.0–7.0)
HGB: 11.6 g/dL (ref 11.6–15.9)
LYMPH#: 1 10*3/uL (ref 0.9–3.3)
MCHC: 33 g/dL (ref 32.0–36.0)
NEUT#: 6.3 10*3/uL (ref 1.5–6.5)

## 2013-01-19 MED ORDER — TRIAMTERENE-HCTZ 37.5-25 MG PO TABS: 1.0000 | ORAL_TABLET | Freq: Every day | ORAL | Status: DC

## 2013-01-19 MED ORDER — DEXAMETHASONE SODIUM PHOSPHATE 4 MG/ML IJ SOLN
40.0000 mg | Freq: Once | INTRAMUSCULAR | Status: AC
  Administered 2013-01-19: 40 mg via INTRAVENOUS

## 2013-01-19 MED ORDER — SODIUM CHLORIDE 0.9 % IV SOLN
Freq: Once | INTRAVENOUS | Status: AC
  Administered 2013-01-19: 12:00:00 via INTRAVENOUS

## 2013-01-19 MED ORDER — ZOLEDRONIC ACID 4 MG/100ML IV SOLN
4.0000 mg | Freq: Once | INTRAVENOUS | Status: AC
  Administered 2013-01-19: 4 mg via INTRAVENOUS
  Filled 2013-01-19: qty 100

## 2013-01-19 NOTE — Progress Notes (Signed)
This office note has been dictated.

## 2013-01-19 NOTE — Patient Instructions (Addendum)
Zoledronic Acid injection (Hypercalcemia, Oncology) What is this medicine? ZOLEDRONIC ACID (ZOE le dron ik AS id) lowers the amount of calcium loss from bone. It is used to treat too much calcium in your blood from cancer. It is also used to prevent complications of cancer that has spread to the bone. This medicine may be used for other purposes; ask your health care provider or pharmacist if you have questions. What should I tell my health care provider before I take this medicine? They need to know if you have any of these conditions: -aspirin-sensitive asthma -dental disease -kidney disease -an unusual or allergic reaction to zoledronic acid, other medicines, foods, dyes, or preservatives -pregnant or trying to get pregnant -breast-feeding How should I use this medicine? This medicine is for infusion into a vein. It is given by a health care professional in a hospital or clinic setting. Talk to your pediatrician regarding the use of this medicine in children. Special care may be needed. Overdosage: If you think you have taken too much of this medicine contact a poison control center or emergency room at once. NOTE: This medicine is only for you. Do not share this medicine with others. What if I miss a dose? It is important not to miss your dose. Call your doctor or health care professional if you are unable to keep an appointment. What may interact with this medicine? -certain antibiotics given by injection -NSAIDs, medicines for pain and inflammation, like ibuprofen or naproxen -some diuretics like bumetanide, furosemide -teriparatide -thalidomide This list may not describe all possible interactions. Give your health care provider a list of all the medicines, herbs, non-prescription drugs, or dietary supplements you use. Also tell them if you smoke, drink alcohol, or use illegal drugs. Some items may interact with your medicine. What should I watch for while using this medicine? Visit  your doctor or health care professional for regular checkups. It may be some time before you see the benefit from this medicine. Do not stop taking your medicine unless your doctor tells you to. Your doctor may order blood tests or other tests to see how you are doing. Women should inform their doctor if they wish to become pregnant or think they might be pregnant. There is a potential for serious side effects to an unborn child. Talk to your health care professional or pharmacist for more information. You should make sure that you get enough calcium and vitamin D while you are taking this medicine. Discuss the foods you eat and the vitamins you take with your health care professional. Some people who take this medicine have severe bone, joint, and/or muscle pain. This medicine may also increase your risk for a broken thigh bone. Tell your doctor right away if you have pain in your upper leg or groin. Tell your doctor if you have any pain that does not go away or that gets worse. What side effects may I notice from receiving this medicine? Side effects that you should report to your doctor or health care professional as soon as possible: -allergic reactions like skin rash, itching or hives, swelling of the face, lips, or tongue -anxiety, confusion, or depression -breathing problems -changes in vision -feeling faint or lightheaded, falls -jaw burning, cramping, pain -muscle cramps, stiffness, or weakness -trouble passing urine or change in the amount of urine Side effects that usually do not require medical attention (report to your doctor or health care professional if they continue or are bothersome): -bone, joint, or muscle pain -  fever -hair loss -irritation at site where injected -loss of appetite -nausea, vomiting -stomach upset -tired This list may not describe all possible side effects. Call your doctor for medical advice about side effects. You may report side effects to FDA at  1-800-FDA-1088. Where should I keep my medicine? This drug is given in a hospital or clinic and will not be stored at home. NOTE: This sheet is a summary. It may not cover all possible information. If you have questions about this medicine, talk to your doctor, pharmacist, or health care provider.  2012, Elsevier/Gold Standard. (04/03/2011 9:06:58 AM) 

## 2013-01-20 NOTE — Progress Notes (Signed)
CC:   Jonita Albee, M.D.  DIAGNOSES: 1. Infiltrating ductal carcinoma of the right breast-estrogen receptor     negative/progesterone receptor positive. 2. IgA kappa myeloma.  CURRENT THERAPY: 1. The patient to have a mastectomy on April 16th. 2. Zometa 4 mg IV monthly. 3. The patient is status post kyphoplasty. 4. Velcade-the patient has received one 3-week cycle to-date. 5. Decadron 40 mg IV monthly.  INTERIM HISTORY:  Ms. Bias comes in for her followup.  She did undergo kyphoplasty.  This was done by Dr. Corliss Skains.  This was done on March 19th.  The kyphoplasty was done at, I think, T11.  There was another compression fracture at, I think, T7 which could not be done.  Biopsies were taken and these were confirmed to be myeloma.  She has had one 3-week cycle of Velcade.  I am going to put the next 3- week cycle on hold to make sure that her blood counts are okay for surgery.  She has a lot of swelling in her legs.  This may be from Decadron that she is taking.  Also may be from the pain medication.  We will try her on a diuretic to try to get the swelling down.  She has had no problems cough-wise.  She has had no problems with bowels.  She has had no bleeding.  PHYSICAL EXAMINATION:  General:  This is an elderly-appearing white female in no obvious distress.  Vital signs:  Temperature of 97.8, pulse 80, respiratory rate 18, blood pressure 165/93.  Weight is 158.  Head and neck:  Normocephalic, atraumatic skull.  There are no ocular or oral lesions.  There are no palpable cervical or supraclavicular lymph nodes. Lungs:  Clear bilaterally.  Cardiac:  Regular rate and rhythm with a normal S1 and S2.  There are no murmurs, rubs, or bruits.  Abdomen: Soft with good bowel sounds.  There is no palpable abdominal mass. There is no palpable hepatosplenomegaly.  Extremities:  2+ edema in her legs.  Back:  Some slight kyphosis.  Neurological:  No focal neurological  deficits.  LABORATORY STUDIES:  White cell count is 8.3, hemoglobin 11.6, hematocrit 35.1, platelet count 293.  Sodium 134, potassium 3.3, BUN 10, creatinine 0.5.  Total protein 8.8 with an albumin of 3.2.  IMPRESSION:  Ms. Cowens is a 79 year old female with 2 separate issues that have occurred simultaneously.  She is going to have a mastectomy for the breast cancer.  I think this is a great way to take care of the breast cancer.  After her mastectomy, I will put her on Perjeta and Herceptin.  I just do not see that she is a really good candidate for aggressive chemotherapy.  I do think, however, that with her being HER2 positive, that we can do adjuvant Herceptin/Perjeta.  I think the combination clearly has been shown to be better than just single Herceptin.  I do want an echocardiogram on her.  We will get this set up for next week.  We will reinstitute the chemotherapy for her myeloma.  This will be done once we get her back after her surgery.  I will give her Zometa and Decadron today.  I do not anticipate any problems with her having surgery.  Her blood counts should be okay.  I want see her back probably about a month from now.  She should have healed up from her mastectomy and we can move ahead with treatment for both myeloma and breast cancer simultaneously.  This is a unique situation that clearly is not the "norm."    ______________________________ Josph Macho, M.D. PRE/MEDQ  D:  01/19/2013  T:  01/20/2013  Job:  [id number]

## 2013-01-23 ENCOUNTER — Encounter (HOSPITAL_COMMUNITY)
Admission: RE | Admit: 2013-01-23 | Discharge: 2013-01-23 | Disposition: A | Discharge status: Home / Self Care | Payer: Medicare Other | Source: Ambulatory Visit | Attending: General Surgery | Admitting: General Surgery

## 2013-01-23 ENCOUNTER — Encounter (HOSPITAL_COMMUNITY): Payer: Self-pay

## 2013-01-23 ENCOUNTER — Ambulatory Visit (HOSPITAL_COMMUNITY)
Admission: RE | Admit: 2013-01-23 | Discharge: 2013-01-23 | Disposition: A | Discharge status: Home / Self Care | Payer: Medicare Other | Source: Ambulatory Visit | Attending: Hematology & Oncology | Admitting: Hematology & Oncology

## 2013-01-23 ENCOUNTER — Ambulatory Visit (HOSPITAL_COMMUNITY)
Admission: RE | Admit: 2013-01-23 | Discharge: 2013-01-23 | Disposition: A | Discharge status: Home / Self Care | Payer: Medicare Other | Source: Ambulatory Visit | Attending: Anesthesiology | Admitting: Anesthesiology

## 2013-01-23 DIAGNOSIS — C9000 Multiple myeloma not having achieved remission: Secondary | ICD-10-CM | POA: Diagnosis not present

## 2013-01-23 DIAGNOSIS — Z01810 Encounter for preprocedural cardiovascular examination: Secondary | ICD-10-CM | POA: Diagnosis not present

## 2013-01-23 DIAGNOSIS — C50919 Malignant neoplasm of unspecified site of unspecified female breast: Secondary | ICD-10-CM | POA: Diagnosis not present

## 2013-01-23 DIAGNOSIS — C773 Secondary and unspecified malignant neoplasm of axilla and upper limb lymph nodes: Secondary | ICD-10-CM | POA: Diagnosis not present

## 2013-01-23 DIAGNOSIS — I10 Essential (primary) hypertension: Secondary | ICD-10-CM | POA: Diagnosis not present

## 2013-01-23 DIAGNOSIS — Z01818 Encounter for other preprocedural examination: Secondary | ICD-10-CM | POA: Diagnosis not present

## 2013-01-23 DIAGNOSIS — J9819 Other pulmonary collapse: Secondary | ICD-10-CM | POA: Diagnosis not present

## 2013-01-23 DIAGNOSIS — Z79899 Other long term (current) drug therapy: Secondary | ICD-10-CM | POA: Insufficient documentation

## 2013-01-23 DIAGNOSIS — Z01812 Encounter for preprocedural laboratory examination: Secondary | ICD-10-CM | POA: Diagnosis not present

## 2013-01-23 HISTORY — DX: Unspecified osteoarthritis, unspecified site: M19.90

## 2013-01-23 LAB — KAPPA/LAMBDA LIGHT CHAINS: Kappa:Lambda Ratio: 135 — ABNORMAL HIGH (ref 0.26–1.65)

## 2013-01-23 LAB — BASIC METABOLIC PANEL
CO2: 24 mEq/L (ref 19–32)
Calcium: 10 mg/dL (ref 8.4–10.5)
Chloride: 95 mEq/L — ABNORMAL LOW (ref 96–112)
Creatinine, Ser: 0.76 mg/dL (ref 0.50–1.10)
Glucose, Bld: 104 mg/dL — ABNORMAL HIGH (ref 70–99)

## 2013-01-23 LAB — PROTEIN ELECTROPHORESIS, SERUM, WITH REFLEX
Alpha-2-Globulin: 10.2 % (ref 7.1–11.8)
Beta 2: 33.8 % — ABNORMAL HIGH (ref 3.2–6.5)
Beta Globulin: 6 % (ref 4.7–7.2)
Gamma Globulin: 2 % — ABNORMAL LOW (ref 11.1–18.8)
M-Spike, %: 2.49 g/dL

## 2013-01-23 LAB — CBC
HCT: 35.5 % — ABNORMAL LOW (ref 36.0–46.0)
MCH: 32.6 pg (ref 26.0–34.0)
MCV: 94.2 fL (ref 78.0–100.0)
Platelets: 306 10*3/uL (ref 150–400)
RDW: 15.7 % — ABNORMAL HIGH (ref 11.5–15.5)
WBC: 7.4 10*3/uL (ref 4.0–10.5)

## 2013-01-23 LAB — IGG, IGA, IGM
IgA: 2520 mg/dL — ABNORMAL HIGH (ref 69–380)
IgM, Serum: 46 mg/dL — ABNORMAL LOW (ref 52–322)

## 2013-01-23 LAB — SURGICAL PCR SCREEN: Staphylococcus aureus: NEGATIVE

## 2013-01-23 NOTE — Pre-Procedure Instructions (Addendum)
Jacqueline Rivas  01/23/2013   Your procedure is scheduled on: [name], April 9th.  Report to [name] at  6:30AM.  Call this number if you have problems the morning of surgery: [phone]   Remember:Do not eat food or drink liquids after midnight.    Take these medicines the morning of surgery with A SIP OF WATER:    lansoprazole (PREVACID) 30 MG capsule May take if needed:  HYDROmorphone (DILAUDID) 2 MG tablet    Do not wear jewelry, make-up or nail polish.  Do not wear lotions, powders, or perfumes. You may wear deodorant.  Do not shave 48 hours prior to surgery.   Do not bring valuables to the hospital.  Contacts, dentures or bridgework may not be worn into surgery.  Leave suitcase in the car. After surgery it may be brought to your room.  For patients admitted to the hospital, checkout time is 11:00 AM the day of  discharge.   Patients discharged the day of surgery will not be allowed to drive  home.  Name and phone number of your driver: -   Special Instructions: Shower using CHG 2 nights before surgery and the night before surgery.  If you shower the day of surgery use CHG.  Use special wash - you have one bottle of CHG for all showers.  You should use approximately 1/3 of the bottle for each shower.   Please read over the following fact sheets that you were given: Pain Booklet, Coughing and Deep Breathing and Surgical Site Infection Prevention

## 2013-01-23 NOTE — Progress Notes (Signed)
  Echocardiogram 2D Echocardiogram has been performed.  Ellender Hose A 01/23/2013, 2:10 PM

## 2013-01-24 ENCOUNTER — Telehealth: Payer: Self-pay | Admitting: Oncology

## 2013-01-24 ENCOUNTER — Ambulatory Visit (INDEPENDENT_AMBULATORY_CARE_PROVIDER_SITE_OTHER): Payer: Medicare Other | Admitting: General Surgery

## 2013-01-24 ENCOUNTER — Encounter (INDEPENDENT_AMBULATORY_CARE_PROVIDER_SITE_OTHER): Payer: PRIVATE HEALTH INSURANCE | Admitting: General Surgery

## 2013-01-24 VITALS — BP 142/88 | HR 88 | Temp 98.6°F | Resp 18 | Ht 60.0 in | Wt 145.0 lb

## 2013-01-24 DIAGNOSIS — C50919 Malignant neoplasm of unspecified site of unspecified female breast: Secondary | ICD-10-CM | POA: Diagnosis not present

## 2013-01-24 DIAGNOSIS — C773 Secondary and unspecified malignant neoplasm of axilla and upper limb lymph nodes: Secondary | ICD-10-CM

## 2013-01-24 DIAGNOSIS — C50911 Malignant neoplasm of unspecified site of right female breast: Secondary | ICD-10-CM

## 2013-01-24 MED ORDER — CEFAZOLIN SODIUM-DEXTROSE 2-3 GM-% IV SOLR
2.0000 g | INTRAVENOUS | Status: AC
  Administered 2013-01-25: 2 g via INTRAVENOUS
  Filled 2013-01-24: qty 50

## 2013-01-24 NOTE — Telephone Encounter (Addendum)
Message copied by Lacie Draft on Tue Jan 24, 2013  9:05 AM ------      Message from: Arlan Organ R      Created: Mon Jan 23, 2013  6:05 PM       Call her son: Myeloma is already getting better!!  Jacqueline Rivas!!!!  Cindee Lame ------Spoke to patient's son regarding myeloma and chemo schedule. Teola Bradley, Keani Gotcher [employer]

## 2013-01-24 NOTE — Progress Notes (Signed)
Chief complaint: Preop right mastectomy  History: Patient returns to the office for preop visit prior to planned right modified mastectomy. Please see my previous note for details were present illness. She has completed radiation treatment to her spine and kyphoplasty for her myeloma. She's had significant improvement in her back pain. As noted previously she has a 4.5 cm invasive ER/PR negative HER-2 positive cancer of the right breast with biopsy-proven right axillary adenopathy. Mastectomy is planned for tomorrow.  Past Medical History  Diagnosis Date  . Diverticulosis   . Hiatal hernia   . Hyperlipidemia   . Hypertension   . Breast mass   . Multiple myeloma   . Breast cancer   . Arthritis    Past Surgical History  Procedure Laterality Date  . Tonsillectomy     Current Outpatient Prescriptions  Medication Sig Dispense Refill  . calcium carbonate (OS-CAL - DOSED IN MG OF ELEMENTAL CALCIUM) 1250 MG tablet Take 1,250 mg by mouth daily as needed (on occasion).       . Coenzyme Q10 (CO Q-10) 100 MG CAPS Take 100 mg by mouth daily as needed (on occasion).       Marland Kitchen dexamethasone (DECADRON) 4 MG tablet Take 4 mg by mouth daily with breakfast.      . fentaNYL (DURAGESIC) 12 MCG/HR Place 1 patch (12.5 mcg total) onto the skin every 3 (three) days.  10 patch  0  . HYDROmorphone (DILAUDID) 2 MG tablet Take 2 mg by mouth 2 (two) times daily as needed for pain (takes along with fentanyl patch).      . lansoprazole (PREVACID) 30 MG capsule Take 1 capsule (30 mg total) by mouth daily.  30 capsule  4  . magnesium oxide (MAG-OX) 400 MG tablet Take 400 mg by mouth daily as needed (constipation).      . polyethylene glycol (MIRALAX / GLYCOLAX) packet Take 17 g by mouth 2 (two) times daily as needed. For constipation      . Potassium 99 MG TABS Take 99 mg by mouth daily as needed (on occasion).      . triamterene-hydrochlorothiazide (MAXZIDE-25) 37.5-25 MG per tablet Take 1 each (1 tablet total) by mouth  daily.  30 tablet  4  . Zinc 50 MG CAPS Take 50 mg by mouth daily as needed (on occasion).       No current facility-administered medications for this visit.   Facility-Administered Medications Ordered in Other Visits  Medication Dose Route Frequency Provider Last Rate Last Dose  . [START ON 01/25/2013] ceFAZolin (ANCEF) IVPB 2 g/50 mL premix  2 g Intravenous On Call to OR Mariella Saa, MD       No Known Allergies Exam: BP 142/88  Pulse 88  Temp(Src) 98.6 F (37 C)  Resp 18  Ht 5' (1.524 m)  Wt 145 lb (65.772 kg)  BMI 28.32 kg/m2 General: Elderly Caucasian female with significant kyphosis Skin: No rash or infection Lungs: Clear good breath sounds without increased work of breathing Cardiac: Regular rate and rhythm. Breasts: There is a 45 cm freely movable mass in the mid right breast with ptotic breasts bilaterally. Lymph nodes: Approximately 2 cm right axillary lymph node palpable  Assessment and plan: Locally advanced right breast cancer with concurrent diagnosis of myeloma. As per our previous discussion and plan we're proceeding with right modified mastectomy. We did discuss the nature of the procedure and recovery and all of her son's questions were answered.

## 2013-01-24 NOTE — Progress Notes (Signed)
Anesthesia PAT Evaluation:  Patient is a 79 year old female scheduled for right modified mastectomy by Dr. Johna Sheriff on 01/25/13.  History includes recent diagnosis of right breast cancer and IgA kappa multiple myeloma with multiple vertebral compression fractures s/p T11 kyphoplasty 01/04/13 and on-going chemotherapy, non-smoker, HTN, HLD, hiatal hernia, diverticulosis, arthritis, tonsillectomy.  PCP is Dr. Robert Bellow. Oncologist is Dr. Myna Hidalgo.  The first EKG done on 01/23/13 had a lot of artifact and fairly diffuse low r waves.  (It had been done with patient sitting in her wheelchair.)  I had staff repeat her EKG once she was on a stretcher and the tracing appeared improved.  (EKG was asked to delete the first EKG done at 1509--which has already been done in [place].)  The 1540 EKG showed SR with a PAC, possible LAE, LAD, cannot rule out anterior infarct (age undetermined).  She denies known history of MI/CHF.  She denies chest pain or significant SOB. She cannot lie down flat secondary to back pain.  Her activity is fairly limited, particularly in the last few months secondary to pain and fracture from multiple myeloma.  She is in a wheelchair today but is able to move from chair to bed unassisted and walk short distances with a cain.  She has had swelling in her legs since starting chemotherapy, possibly related to Decadron.  She was recently started on HCTZ combination pill.  She had a normal EF and grade 1 diastolic dysfunction by echo yesterday.  Heart RRR, no murmur noted.  Lungs clear.  1+ LE edema.  Echo on 01/23/13 (done for pre-Herceptin therapy) showed: Left ventricle: The cavity size was normal. There was mild focal basal hypertrophy of the septum. Systolic function was normal. The estimated ejection fraction was in the range of 60% to 65%. Wall motion was normal; there were no regional wall motion abnormalities. Doppler parameters are consistent with abnormal left ventricular relaxation (grade 1  diastolic dysfunction). Trivial MR.  CXR on 01/23/13 showed: 1. Bilateral subsegmental atelectasis. 2. No evidence for new consolidation or pulmonary edema. 3. Cardiomegaly. 4. The patient has had previous  vertebral plasty at T11. Other wedge compression fractures are identified at T12, T10, T7, and T5. The fracture at T10 appears new since MRI 12/12/2012.  Preoperative labs noted.  Patient currently has limited exercise tolerance, but denies chest pain history.  Her EKG did not show any ischemic changes.  Her Echo yesterday showed a normal EF without wall motion abnormalities or significant valvular disease.  She has no known CAD/MI or DM history.  She will be evaluated by her assigned anesthesiologist on the day of surgery, but if she remains asymptomatic from a CV standpoint then would anticipate she could proceed as planned.   Velna Ochs [facility]/Anesthesiology Phone [phone] 01/24/2013 10:37 AM

## 2013-01-25 ENCOUNTER — Encounter (HOSPITAL_COMMUNITY): Payer: Self-pay | Admitting: *Deleted

## 2013-01-25 ENCOUNTER — Observation Stay (HOSPITAL_COMMUNITY)
Admission: RE | Admit: 2013-01-25 | Discharge: 2013-01-26 | Disposition: A | Discharge status: Home / Self Care | Payer: Medicare Other | Source: Ambulatory Visit | Attending: General Surgery | Admitting: General Surgery

## 2013-01-25 ENCOUNTER — Ambulatory Visit (HOSPITAL_COMMUNITY): Payer: Medicare Other | Admitting: Certified Registered"

## 2013-01-25 ENCOUNTER — Encounter (HOSPITAL_COMMUNITY): Payer: Self-pay | Admitting: Vascular Surgery

## 2013-01-25 ENCOUNTER — Encounter (HOSPITAL_COMMUNITY)
Admission: RE | Disposition: A | Discharge status: Home / Self Care | Payer: Self-pay | Source: Ambulatory Visit | Attending: General Surgery

## 2013-01-25 DIAGNOSIS — C50919 Malignant neoplasm of unspecified site of unspecified female breast: Secondary | ICD-10-CM

## 2013-01-25 DIAGNOSIS — C9000 Multiple myeloma not having achieved remission: Secondary | ICD-10-CM | POA: Insufficient documentation

## 2013-01-25 DIAGNOSIS — Z01812 Encounter for preprocedural laboratory examination: Secondary | ICD-10-CM | POA: Insufficient documentation

## 2013-01-25 DIAGNOSIS — I10 Essential (primary) hypertension: Secondary | ICD-10-CM | POA: Diagnosis not present

## 2013-01-25 DIAGNOSIS — C773 Secondary and unspecified malignant neoplasm of axilla and upper limb lymph nodes: Secondary | ICD-10-CM | POA: Diagnosis not present

## 2013-01-25 DIAGNOSIS — K5730 Diverticulosis of large intestine without perforation or abscess without bleeding: Secondary | ICD-10-CM | POA: Diagnosis not present

## 2013-01-25 DIAGNOSIS — Z01810 Encounter for preprocedural cardiovascular examination: Secondary | ICD-10-CM | POA: Insufficient documentation

## 2013-01-25 DIAGNOSIS — Z79899 Other long term (current) drug therapy: Secondary | ICD-10-CM | POA: Insufficient documentation

## 2013-01-25 HISTORY — PX: MODIFIED MASTECTOMY: SHX5268

## 2013-01-25 SURGERY — MODIFIED MASTECTOMY
Anesthesia: General | Site: Breast | Laterality: Right | Wound class: Clean

## 2013-01-25 MED ORDER — FENTANYL CITRATE 0.05 MG/ML IJ SOLN
INTRAMUSCULAR | Status: DC | PRN
  Administered 2013-01-25 (×4): 50 ug via INTRAVENOUS

## 2013-01-25 MED ORDER — ACETAMINOPHEN 10 MG/ML IV SOLN
1000.0000 mg | Freq: Once | INTRAVENOUS | Status: AC
  Administered 2013-01-25: 1000 mg via INTRAVENOUS
  Filled 2013-01-25: qty 100

## 2013-01-25 MED ORDER — HYDROMORPHONE HCL PF 1 MG/ML IJ SOLN: 0.5000 mg | INTRAMUSCULAR | Status: DC | PRN

## 2013-01-25 MED ORDER — TRIAMTERENE-HCTZ 37.5-25 MG PO TABS
1.0000 | ORAL_TABLET | Freq: Every day | ORAL | Status: DC
  Administered 2013-01-25: 1 via ORAL
  Filled 2013-01-25 (×2): qty 1

## 2013-01-25 MED ORDER — HYDROMORPHONE HCL PF 1 MG/ML IJ SOLN
0.2500 mg | INTRAMUSCULAR | Status: DC | PRN
  Administered 2013-01-25 (×2): 0.5 mg via INTRAVENOUS

## 2013-01-25 MED ORDER — ACETAMINOPHEN 10 MG/ML IV SOLN: 1000.0000 mg | Freq: Once | INTRAVENOUS | Status: DC | PRN

## 2013-01-25 MED ORDER — CHLORHEXIDINE GLUCONATE 4 % EX LIQD: 1.0000 "application " | Freq: Once | CUTANEOUS | Status: DC

## 2013-01-25 MED ORDER — HYDROMORPHONE HCL PF 1 MG/ML IJ SOLN
INTRAMUSCULAR | Status: AC
  Filled 2013-01-25: qty 1

## 2013-01-25 MED ORDER — KCL IN DEXTROSE-NACL 20-5-0.45 MEQ/L-%-% IV SOLN
INTRAVENOUS | Status: DC
  Administered 2013-01-25: 17:00:00 via INTRAVENOUS
  Filled 2013-01-25 (×2): qty 1000

## 2013-01-25 MED ORDER — HEPARIN SODIUM (PORCINE) 5000 UNIT/ML IJ SOLN
5000.0000 [IU] | Freq: Three times a day (TID) | INTRAMUSCULAR | Status: DC
  Administered 2013-01-26: 5000 [IU] via SUBCUTANEOUS
  Filled 2013-01-25 (×3): qty 1

## 2013-01-25 MED ORDER — DEXAMETHASONE 4 MG PO TABS
4.0000 mg | ORAL_TABLET | Freq: Every day | ORAL | Status: DC
  Administered 2013-01-26: 4 mg via ORAL
  Filled 2013-01-25 (×2): qty 1

## 2013-01-25 MED ORDER — DEXAMETHASONE SODIUM PHOSPHATE 4 MG/ML IJ SOLN
INTRAMUSCULAR | Status: DC | PRN
  Administered 2013-01-25: 8 mg via INTRAVENOUS

## 2013-01-25 MED ORDER — ONDANSETRON HCL 4 MG/2ML IJ SOLN: 4.0000 mg | Freq: Once | INTRAMUSCULAR | Status: DC | PRN

## 2013-01-25 MED ORDER — LIDOCAINE HCL (CARDIAC) 20 MG/ML IV SOLN
INTRAVENOUS | Status: DC | PRN
  Administered 2013-01-25: 50 mg via INTRAVENOUS

## 2013-01-25 MED ORDER — LACTATED RINGERS IV SOLN
INTRAVENOUS | Status: DC | PRN
  Administered 2013-01-25 (×2): via INTRAVENOUS

## 2013-01-25 MED ORDER — DEXMEDETOMIDINE HCL IN NACL 200 MCG/50ML IV SOLN
0.4000 ug/kg/h | INTRAVENOUS | Status: DC
  Administered 2013-01-25: 0.7 ug/kg/h via INTRAVENOUS
  Filled 2013-01-25: qty 50

## 2013-01-25 MED ORDER — EPHEDRINE SULFATE 50 MG/ML IJ SOLN
INTRAMUSCULAR | Status: DC | PRN
  Administered 2013-01-25: 5 mg via INTRAVENOUS

## 2013-01-25 MED ORDER — PANTOPRAZOLE SODIUM 40 MG PO TBEC
40.0000 mg | DELAYED_RELEASE_TABLET | Freq: Every day | ORAL | Status: DC
  Administered 2013-01-25: 40 mg via ORAL
  Filled 2013-01-25: qty 1

## 2013-01-25 MED ORDER — HYDROMORPHONE HCL 2 MG PO TABS
2.0000 mg | ORAL_TABLET | ORAL | Status: DC | PRN
  Administered 2013-01-25 – 2013-01-26 (×2): 2 mg via ORAL
  Filled 2013-01-25 (×2): qty 1

## 2013-01-25 MED ORDER — ONDANSETRON HCL 4 MG PO TABS: 4.0000 mg | ORAL_TABLET | Freq: Four times a day (QID) | ORAL | Status: DC | PRN

## 2013-01-25 MED ORDER — ONDANSETRON HCL 4 MG/2ML IJ SOLN
INTRAMUSCULAR | Status: DC | PRN
  Administered 2013-01-25 (×2): 4 mg via INTRAVENOUS

## 2013-01-25 MED ORDER — ONDANSETRON HCL 4 MG/2ML IJ SOLN: 4.0000 mg | Freq: Four times a day (QID) | INTRAMUSCULAR | Status: DC | PRN

## 2013-01-25 MED ORDER — FENTANYL 12 MCG/HR TD PT72
12.5000 ug | MEDICATED_PATCH | TRANSDERMAL | Status: DC
  Administered 2013-01-25: 12.5 ug via TRANSDERMAL
  Filled 2013-01-25: qty 1

## 2013-01-25 MED ORDER — 0.9 % SODIUM CHLORIDE (POUR BTL) OPTIME
TOPICAL | Status: DC | PRN
  Administered 2013-01-25: 1000 mL

## 2013-01-25 MED ORDER — MIDAZOLAM HCL 5 MG/5ML IJ SOLN
INTRAMUSCULAR | Status: DC | PRN
  Administered 2013-01-25 (×2): 1 mg via INTRAVENOUS

## 2013-01-25 MED ORDER — PROPOFOL 10 MG/ML IV BOLUS
INTRAVENOUS | Status: DC | PRN
  Administered 2013-01-25: 110 mg via INTRAVENOUS

## 2013-01-25 SURGICAL SUPPLY — 40 items
APPLIER CLIP 11 MED OPEN (CLIP) ×2
BINDER BREAST LRG (GAUZE/BANDAGES/DRESSINGS) ×2 IMPLANT
BINDER BREAST XLRG (GAUZE/BANDAGES/DRESSINGS) IMPLANT
CANISTER SUCTION 2500CC (MISCELLANEOUS) ×2 IMPLANT
CHLORAPREP W/TINT 26ML (MISCELLANEOUS) ×2 IMPLANT
CLIP APPLIE 11 MED OPEN (CLIP) ×1 IMPLANT
CLIP TI MEDIUM 6 (CLIP) ×2 IMPLANT
CLOTH BEACON ORANGE TIMEOUT ST (SAFETY) ×2 IMPLANT
COVER SURGICAL LIGHT HANDLE (MISCELLANEOUS) ×2 IMPLANT
DERMABOND ADVANCED (GAUZE/BANDAGES/DRESSINGS) ×1
DERMABOND ADVANCED .7 DNX12 (GAUZE/BANDAGES/DRESSINGS) ×1 IMPLANT
DRAIN CHANNEL 19F RND (DRAIN) ×4 IMPLANT
DRAPE CHEST BREAST 15X10 FENES (DRAPES) ×2 IMPLANT
DRAPE PROXIMA HALF (DRAPES) ×2 IMPLANT
DRAPE UTILITY 15X26 W/TAPE STR (DRAPE) ×4 IMPLANT
DRSG ADAPTIC 3X8 NADH LF (GAUZE/BANDAGES/DRESSINGS) ×2 IMPLANT
ELECT CAUTERY BLADE 6.4 (BLADE) ×2 IMPLANT
ELECT REM PT RETURN 9FT ADLT (ELECTROSURGICAL) ×2
ELECTRODE REM PT RTRN 9FT ADLT (ELECTROSURGICAL) ×1 IMPLANT
EVACUATOR SILICONE 100CC (DRAIN) ×4 IMPLANT
GLOVE BIO SURGEON STRL SZ7 (GLOVE) ×2 IMPLANT
GLOVE BIOGEL PI IND STRL 8 (GLOVE) ×1 IMPLANT
GLOVE BIOGEL PI INDICATOR 8 (GLOVE) ×1
GLOVE SS BIOGEL STRL SZ 7.5 (GLOVE) ×1 IMPLANT
GLOVE SUPERSENSE BIOGEL SZ 7.5 (GLOVE) ×1
GOWN PREVENTION PLUS XLARGE (GOWN DISPOSABLE) ×2 IMPLANT
GOWN STRL NON-REIN LRG LVL3 (GOWN DISPOSABLE) ×2 IMPLANT
KIT BASIN OR (CUSTOM PROCEDURE TRAY) ×2 IMPLANT
KIT ROOM TURNOVER OR (KITS) ×2 IMPLANT
NS IRRIG 1000ML POUR BTL (IV SOLUTION) ×2 IMPLANT
PACK GENERAL/GYN (CUSTOM PROCEDURE TRAY) ×2 IMPLANT
PAD ARMBOARD 7.5X6 YLW CONV (MISCELLANEOUS) ×2 IMPLANT
SPECIMEN JAR X LARGE (MISCELLANEOUS) ×2 IMPLANT
SPONGE GAUZE 4X4 12PLY (GAUZE/BANDAGES/DRESSINGS) ×2 IMPLANT
STAPLER VISISTAT 35W (STAPLE) ×2 IMPLANT
SUT ETHILON 2 0 FS 18 (SUTURE) ×4 IMPLANT
SUT MON AB 5-0 PS2 18 (SUTURE) ×2 IMPLANT
SUT VIC AB 3-0 SH 18 (SUTURE) ×4 IMPLANT
TOWEL OR 17X24 6PK STRL BLUE (TOWEL DISPOSABLE) ×2 IMPLANT
TOWEL OR 17X26 10 PK STRL BLUE (TOWEL DISPOSABLE) ×2 IMPLANT

## 2013-01-25 NOTE — Anesthesia Procedure Notes (Signed)
Procedure Name: LMA Insertion Date/Time: 01/25/2013 8:42 AM Performed by: Armandina Gemma Pre-anesthesia Checklist: Patient identified, Timeout performed, Emergency Drugs available, Suction available and Patient being monitored Patient Re-evaluated:Patient Re-evaluated prior to inductionOxygen Delivery Method: Circle system utilized Preoxygenation: Pre-oxygenation with 100% oxygen Intubation Type: IV induction Ventilation: Mask ventilation without difficulty LMA: LMA inserted Tube type: Oral Tube size: 4.0 mm Number of attempts: 1 Placement Confirmation: breath sounds checked- equal and bilateral and positive ETCO2 Tube secured with: Tape Dental Injury: Teeth and Oropharynx as per pre-operative assessment  Comments: LMA inserted by Joslin and IV induction by Joslin- teeth and mouth as preop

## 2013-01-25 NOTE — Op Note (Signed)
Preoperative Diagnosis: CANCER OF RIGHT BREAST  Postoprative Diagnosis: CANCER OF RIGHT BREAST  Procedure: Procedure(s): MODIFIED MASTECTOMY   Surgeon: Glenna Fellows T   Assistants: none  Anesthesia:  General LMA anesthesia  Indications:   Patient is a 79 year old female who also has a recent diagnosis of multiple myeloma who presents with a 4 cm right breast mass and palpable axillary adenopathy. Biopsy has revealed invasive ductal carcinoma in her breast and axilla. We recommended proceeding with right modified mastectomy. The indications for the procedure and recovery and risks have been discussed extensively detailed elsewhere.  Procedure Detail:  Patient is brought to the operating room, placed in supine position on the operating table, and laryngeal mask general anesthesia induced. She was carefully positioned with her right arm extended the entire right chest and upper arm widely sterilely prepped and draped. She received preoperative IV antibiotics. Patient timeout was performed and the procedure verified. An elliptical oblique incision was used encompassing the mass and the nipple areolar complex extending from it the sternum up toward the axilla. The skin and subcutaneous flaps were then raised superiorly toward the clavicle medially to the edge of the sternum, inferiorly to the inframammary crease and laterally out to the anterior border of the latissimus which was dissected free. The breast was then dissected up off the chest wall with cautery. The mass was not adherent to the chest wall area and the specimen was reflected off the pectoralis major and off the anterior portion of the serratus. The pectoralis minor was identified and the clavipectoral fascia incised. The pectoralis minor was retracted medially. The axillary vein was identified. All fibrofatty tissue beginning at the medial edge of the pectoralis minor and inferior to the axillary vein was swept inferiorly and  laterally. Perforating vessels from the axillary artery and vein to the axilla were controlled with clips. As the dissection progressed laterally the long thoracic nerve was identified and protected and the thoracodorsal nerve and vessels identified and protected. All tissue between these 2 structures posteriorly to the subscapularis was then swept inferiorly protecting the nerves and final attachments along the latissimus and serratus were divided and the specimen was removed. It was oriented and sent for permanent pathology. The wound was thoroughly irrigated and hemostasis assured. 2 closed suction drains were left beneath the flaps. The subcutaneous was closed with interrupted 3-0 Vicryl and the skin with staples. Sponge needle and instrument counts were correct.   Estimated Blood Loss:  less than 50 mL         Drains: #19 round Blake drain x2  Blood Given: none          Specimens: right breast and axillary contents        Complications:  * No complications entered in OR log *         Disposition: PACU - hemodynamically stable.         Condition: stable

## 2013-01-25 NOTE — Transfer of Care (Signed)
Immediate Anesthesia Transfer of Care Note  Patient: Jacqueline Rivas  Procedure(s) Performed: Procedure(s): MODIFIED MASTECTOMY (Right)  Patient Location: PACU  Anesthesia Type:General  Level of Consciousness: awake and alert   Airway & Oxygen Therapy: Patient Spontanous Breathing and Patient connected to nasal cannula oxygen  Post-op Assessment: Report given to PACU RN and Post -op Vital signs reviewed and stable  Post vital signs: Reviewed and stable  Complications: No apparent anesthesia complications

## 2013-01-25 NOTE — H&P (View-Only) (Signed)
Chief complaint: Preop right mastectomy  History: Patient returns to the office for preop visit prior to planned right modified mastectomy. Please see my previous note for details were present illness. She has completed radiation treatment to her spine and kyphoplasty for her myeloma. She's had significant improvement in her back pain. As noted previously she has a 4.5 cm invasive ER/PR negative HER-2 positive cancer of the right breast with biopsy-proven right axillary adenopathy. Mastectomy is planned for tomorrow.  Past Medical History  Diagnosis Date  . Diverticulosis   . Hiatal hernia   . Hyperlipidemia   . Hypertension   . Breast mass   . Multiple myeloma   . Breast cancer   . Arthritis    Past Surgical History  Procedure Laterality Date  . Tonsillectomy     Current Outpatient Prescriptions  Medication Sig Dispense Refill  . calcium carbonate (OS-CAL - DOSED IN MG OF ELEMENTAL CALCIUM) 1250 MG tablet Take 1,250 mg by mouth daily as needed (on occasion).       . Coenzyme Q10 (CO Q-10) 100 MG CAPS Take 100 mg by mouth daily as needed (on occasion).       . dexamethasone (DECADRON) 4 MG tablet Take 4 mg by mouth daily with breakfast.      . fentaNYL (DURAGESIC) 12 MCG/HR Place 1 patch (12.5 mcg total) onto the skin every 3 (three) days.  10 patch  0  . HYDROmorphone (DILAUDID) 2 MG tablet Take 2 mg by mouth 2 (two) times daily as needed for pain (takes along with fentanyl patch).      . lansoprazole (PREVACID) 30 MG capsule Take 1 capsule (30 mg total) by mouth daily.  30 capsule  4  . magnesium oxide (MAG-OX) 400 MG tablet Take 400 mg by mouth daily as needed (constipation).      . polyethylene glycol (MIRALAX / GLYCOLAX) packet Take 17 g by mouth 2 (two) times daily as needed. For constipation      . Potassium 99 MG TABS Take 99 mg by mouth daily as needed (on occasion).      . triamterene-hydrochlorothiazide (MAXZIDE-25) 37.5-25 MG per tablet Take 1 each (1 tablet total) by mouth  daily.  30 tablet  4  . Zinc 50 MG CAPS Take 50 mg by mouth daily as needed (on occasion).       No current facility-administered medications for this visit.   Facility-Administered Medications Ordered in Other Visits  Medication Dose Route Frequency Provider Last Rate Last Dose  . [START ON 01/25/2013] ceFAZolin (ANCEF) IVPB 2 g/50 mL premix  2 g Intravenous On Call to OR Willa Brocks T Avon Mergenthaler, MD       No Known Allergies Exam: BP 142/88  Pulse 88  Temp(Src) 98.6 F (37 C)  Resp 18  Ht 5' (1.524 m)  Wt 145 lb (65.772 kg)  BMI 28.32 kg/m2 General: Elderly Caucasian female with significant kyphosis Skin: No rash or infection Lungs: Clear good breath sounds without increased work of breathing Cardiac: Regular rate and rhythm. Breasts: There is a 45 cm freely movable mass in the mid right breast with ptotic breasts bilaterally. Lymph nodes: Approximately 2 cm right axillary lymph node palpable  Assessment and plan: Locally advanced right breast cancer with concurrent diagnosis of myeloma. As per our previous discussion and plan we're proceeding with right modified mastectomy. We did discuss the nature of the procedure and recovery and all of her son's questions were answered. 

## 2013-01-25 NOTE — Interval H&P Note (Signed)
History and Physical Interval Note:  01/25/2013 8:22 AM  Jacqueline Rivas  has presented today for surgery, with the diagnosis of CANCER OF RIGHT BREAST  The various methods of treatment have been discussed with the patient and family. After consideration of risks, benefits and other options for treatment, the patient has consented to  Procedure(s): MODIFIED MASTECTOMY (Right) as a surgical intervention .  The patient's history has been reviewed, patient examined, no change in status, stable for surgery.  I have reviewed the patient's chart and labs.  Questions were answered to the patient's satisfaction.     Adena Sima T

## 2013-01-25 NOTE — Anesthesia Postprocedure Evaluation (Signed)
  Anesthesia Post-op Note  Patient: Jacqueline Rivas  Procedure(s) Performed: Procedure(s): MODIFIED MASTECTOMY (Right)  Patient Location: PACU  Anesthesia Type:General  Level of Consciousness: awake, alert  and oriented  Airway and Oxygen Therapy: Patient Spontanous Breathing and Patient connected to nasal cannula oxygen  Post-op Pain: none  Post-op Assessment: Post-op Vital signs reviewed, Patient's Cardiovascular Status Stable, Respiratory Function Stable, Patent Airway and Pain level controlled  Post-op Vital Signs: stable  Complications: No apparent anesthesia complications

## 2013-01-25 NOTE — Preoperative (Signed)
Beta Blockers   Reason not to administer Beta Blockers:Not Applicable 

## 2013-01-25 NOTE — Anesthesia Preprocedure Evaluation (Signed)
Anesthesia Evaluation  Patient identified by MRN, date of birth, ID band Patient awake    Reviewed: Allergy & Precautions, H&P , NPO status   Airway Mallampati: II      Dental  (+) Teeth Intact and Dental Advisory Given   Pulmonary  breath sounds clear to auscultation        Cardiovascular Rhythm:Regular     Neuro/Psych    GI/Hepatic   Endo/Other    Renal/GU      Musculoskeletal   Abdominal   Peds  Hematology   Anesthesia Other Findings   Reproductive/Obstetrics                           Anesthesia Physical Anesthesia Plan  ASA: III  Anesthesia Plan: General   Post-op Pain Management:    Induction: Intravenous  Airway Management Planned: LMA  Additional Equipment:   Intra-op Plan:   Post-operative Plan:   Informed Consent: I have reviewed the patients History and Physical, chart, labs and discussed the procedure including the risks, benefits and alternatives for the proposed anesthesia with the patient or authorized representative who has indicated his/her understanding and acceptance.   Dental advisory given  Plan Discussed with: CRNA and Anesthesiologist  Anesthesia Plan Comments: (R. breast Ca Multiple myeloma Chronic back pain  Htn  Plan GA with LMA  Kipp Brood, MD)        Anesthesia Quick Evaluation

## 2013-01-26 ENCOUNTER — Ambulatory Visit: Payer: Medicare Other

## 2013-01-26 ENCOUNTER — Other Ambulatory Visit: Payer: Medicare Other | Admitting: Lab

## 2013-01-26 ENCOUNTER — Other Ambulatory Visit (HOSPITAL_COMMUNITY): Payer: Medicare Other

## 2013-01-26 ENCOUNTER — Encounter (HOSPITAL_COMMUNITY): Payer: Self-pay | Admitting: General Surgery

## 2013-01-26 LAB — BASIC METABOLIC PANEL
Calcium: 9.5 mg/dL (ref 8.4–10.5)
GFR calc Af Amer: 75 mL/min — ABNORMAL LOW (ref >90)
GFR calc non Af Amer: 65 mL/min — ABNORMAL LOW (ref >90)
Potassium: 4.9 mEq/L (ref 3.5–5.1)
Sodium: 127 mEq/L — ABNORMAL LOW (ref 135–145)

## 2013-01-26 LAB — CBC
Hemoglobin: 11 g/dL — ABNORMAL LOW (ref 12.0–15.0)
MCH: 31.7 pg (ref 26.0–34.0)
MCHC: 33.4 g/dL (ref 30.0–36.0)
Platelets: 232 10*3/uL (ref 150–400)
RDW: 15.7 % — ABNORMAL HIGH (ref 11.5–15.5)

## 2013-01-26 NOTE — Progress Notes (Signed)
Patient discharged to home with instructions. 

## 2013-01-26 NOTE — Progress Notes (Signed)
1 Day Post-Op  Subjective: POD#1  Doing well  Objective: Vital signs in last 24 hours: Temp:  [97.5 F (36.4 C)-98.2 F (36.8 C)] 97.8 F (36.6 C) (04/10 0609) Pulse Rate:  [58-82] 71 (04/10 0609) Resp:  [10-18] 16 (04/10 0609) BP: (105-151)/(46-97) 147/78 mmHg (04/10 0609) SpO2:  [91 %-98 %] 98 % (04/10 0609) Weight:  [145 lb (65.772 kg)-149 lb 3.2 oz (67.677 kg)] 149 lb 3.2 oz (67.677 kg) (04/09 1412) Last BM Date: 01/24/13  Intake/Output from previous day: 04/09 0701 - 04/10 0700 In: 2790.8 [P.O.:1040; I.V.:1670.8] Out: 2075 [Urine:2000; Drains:25; Blood:50] Intake/Output this shift: Total I/O In: 1550.8 [P.O.:800; I.V.:670.8; Other:80] Out: 1725 [Urine:1700; Drains:25]  Drains serosang Flaps look viable No hematoma  Lab Results:   Recent Labs  01/23/13 1509  WBC 7.4  HGB 12.3  HCT 35.5*  PLT 306   BMET  Recent Labs  01/23/13 1509  NA 132*  K 3.8  CL 95*  CO2 24  GLUCOSE 104*  BUN 14  CREATININE 0.76  CALCIUM 10.0   PT/INR No results found for this basename: LABPROT, INR,  in the last 72 hours ABG No results found for this basename: PHART, PCO2, PO2, HCO3,  in the last 72 hours  Studies/Results: No results found.  Anti-infectives: Anti-infectives   Start     Dose/Rate Route Frequency Ordered Stop   01/25/13 0600  ceFAZolin (ANCEF) IVPB 2 g/50 mL premix     2 g 100 mL/hr over 30 Minutes Intravenous On call to O.R. 01/24/13 1434 01/25/13 0845      Assessment/Plan: s/p Procedure(s): MODIFIED MASTECTOMY (Right)  Discharge  LOS: 1 day    Eann Cleland A 01/26/2013

## 2013-01-27 ENCOUNTER — Ambulatory Visit: Admission: RE | Admit: 2013-01-27 | Payer: Medicare Other | Source: Ambulatory Visit | Admitting: Radiation Oncology

## 2013-01-30 NOTE — Discharge Summary (Signed)
  Patient ID: Jacqueline Rivas [id number] y.o. 14-Dec-1931  01/25/2013  Discharge date and time: 01/26/2013   Admitting Physician: Glenna Fellows T  Discharge Physician: Glenna Fellows T  Admission Diagnoses: CANCER OF RIGHT BREAST  Discharge Diagnoses: same  Operations: Procedure(s): MODIFIED MASTECTOMY  Admission Condition: fair  Discharged Condition: fair  Indication for Admission: patient is a 79 year old female who with a recent diagnosis of multiple myeloma who also has had a mass in the right breast for several months. Imaging showed a suspicious 4 cm mass as well as an enlarged right axillary lymph node and biopsy of both of these areas has shown invasive mammary carcinoma. After discussion of treatment options detailed extensively elsewhere we have elected to proceed with right modified mastectomy and she is admitted for this procedure.  Hospital Course: patient was admitted on the morning of her procedure and underwent an uneventful right modified mastectomy. She was observed overnight and did well. On the following morning she had mild pain, vital signs were stable, wound was healing well and drainage was unremarkable. She is felt ready for discharge.   Disposition: Home  Patient Instructions:    Medication List    TAKE these medications       calcium carbonate 1250 MG tablet  Commonly known as:  OS-CAL - dosed in mg of elemental calcium  Take 1,250 mg by mouth daily as needed (on occasion).     Co Q-10 100 MG Caps  Take 100 mg by mouth daily as needed (on occasion).     dexamethasone 4 MG tablet  Commonly known as:  DECADRON  Take 4 mg by mouth daily with breakfast.     fentaNYL 12 MCG/HR  Commonly known as:  DURAGESIC  Place 1 patch (12.5 mcg total) onto the skin every 3 (three) days.     HYDROmorphone 2 MG tablet  Commonly known as:  DILAUDID  Take 2 mg by mouth 2 (two) times daily as needed for pain (takes along with fentanyl patch).     lansoprazole 30 MG capsule  Commonly known as:  PREVACID  Take 1 capsule (30 mg total) by mouth daily.     magnesium oxide 400 MG tablet  Commonly known as:  MAG-OX  Take 400 mg by mouth daily as needed (constipation).     polyethylene glycol packet  Commonly known as:  MIRALAX / GLYCOLAX  Take 17 g by mouth 2 (two) times daily as needed. For constipation     Potassium 99 MG Tabs  Take 99 mg by mouth daily as needed (on occasion).     triamterene-hydrochlorothiazide 37.5-25 MG per tablet  Commonly known as:  MAXZIDE-25  Take 1 each (1 tablet total) by mouth daily.     Zinc 50 MG Caps  Take 50 mg by mouth daily as needed (on occasion).        Activity: activity as tolerated Diet: regular diet Wound Care: none needed  Follow-up:  With Dr. Johna Sheriff in 2 weeks.  Signed: Mariella Saa MD, FACS  01/30/2013, 4:53 PM

## 2013-02-02 ENCOUNTER — Encounter (INDEPENDENT_AMBULATORY_CARE_PROVIDER_SITE_OTHER): Payer: Self-pay | Admitting: General Surgery

## 2013-02-02 ENCOUNTER — Ambulatory Visit: Payer: Medicare Other

## 2013-02-02 ENCOUNTER — Ambulatory Visit (INDEPENDENT_AMBULATORY_CARE_PROVIDER_SITE_OTHER): Payer: Medicare Other | Admitting: General Surgery

## 2013-02-02 ENCOUNTER — Other Ambulatory Visit: Payer: Medicare Other | Admitting: Lab

## 2013-02-02 VITALS — BP 142/80 | HR 76 | Temp 97.3°F | Resp 16 | Ht 64.0 in | Wt 146.0 lb

## 2013-02-02 DIAGNOSIS — C50919 Malignant neoplasm of unspecified site of unspecified female breast: Secondary | ICD-10-CM

## 2013-02-02 DIAGNOSIS — C50911 Malignant neoplasm of unspecified site of right female breast: Secondary | ICD-10-CM

## 2013-02-02 DIAGNOSIS — C773 Secondary and unspecified malignant neoplasm of axilla and upper limb lymph nodes: Secondary | ICD-10-CM

## 2013-02-02 NOTE — Progress Notes (Signed)
History: Patient returns one week following right modified mastectomy for cancer the right breast metastatic to axillary nodes. She's having some pain in her back from her myeloma but no real difficulties regards to her mastectomy.  Exam: BP 142/80  Pulse 76  Temp(Src) 97.3 F (36.3 C) (Temporal)  Resp 16  Ht 5\' 4"  (1.626 m)  Wt 146 lb (66.225 kg)  BMI 25.05 kg/m2 Her wound is healing very nicely without infection or complication. She has minimal drainage from the medial drain which was removed. The lateral drain has significant serosanguineous drainage and this was left in place. We removed half of her staples.   Pathology: Diagnosis Breast, modified radical mastectomy , right - INVASIVE DUCTAL CARCINOMA, SEE COMMENT. - LYMPHOVASCULAR INVASION IDENTIFIED. - INVASIVE TUMOR IS 0.1 CM FROM NEAREST MARGIN (DEEP). - TWO LYMPH NODES, POSITIVE FOR METASTATIC MAMMARY CARCINOMA (2/13) - TUMOR DEPOSITS ARE AND 1.5CM - EXTRA-CAPSULE TUMOR EXTENSION IDENTIFIED.  Assessment and plan: Doing well following modified mastectomy. 2 return in one week to assess for drain removal

## 2013-02-09 ENCOUNTER — Other Ambulatory Visit: Payer: Self-pay | Admitting: *Deleted

## 2013-02-09 ENCOUNTER — Other Ambulatory Visit: Payer: Self-pay | Admitting: Hematology & Oncology

## 2013-02-09 ENCOUNTER — Encounter: Payer: Self-pay | Admitting: Radiation Oncology

## 2013-02-09 DIAGNOSIS — C50911 Malignant neoplasm of unspecified site of right female breast: Secondary | ICD-10-CM

## 2013-02-09 DIAGNOSIS — C9000 Multiple myeloma not having achieved remission: Secondary | ICD-10-CM

## 2013-02-09 DIAGNOSIS — C773 Secondary and unspecified malignant neoplasm of axilla and upper limb lymph nodes: Secondary | ICD-10-CM

## 2013-02-09 MED ORDER — FENTANYL 12 MCG/HR TD PT72: 1.0000 | MEDICATED_PATCH | TRANSDERMAL | Status: DC

## 2013-02-09 MED ORDER — HYDROMORPHONE HCL 2 MG PO TABS: 2.0000 mg | ORAL_TABLET | Freq: Four times a day (QID) | ORAL | Status: DC | PRN

## 2013-02-09 NOTE — Telephone Encounter (Signed)
Pt's son called. His mother needs a Duragesic patches and Dilaudid. Will route to Dr Myna Hidalgo for approval and signature and place at the front desk for pick up.

## 2013-02-10 ENCOUNTER — Ambulatory Visit (INDEPENDENT_AMBULATORY_CARE_PROVIDER_SITE_OTHER): Payer: Medicare Other | Admitting: General Surgery

## 2013-02-10 ENCOUNTER — Ambulatory Visit
Admission: RE | Admit: 2013-02-10 | Discharge: 2013-02-10 | Disposition: A | Discharge status: Home / Self Care | Payer: Medicare Other | Source: Ambulatory Visit | Attending: Radiation Oncology | Admitting: Radiation Oncology

## 2013-02-10 ENCOUNTER — Encounter (INDEPENDENT_AMBULATORY_CARE_PROVIDER_SITE_OTHER): Payer: Self-pay | Admitting: General Surgery

## 2013-02-10 VITALS — BP 133/77 | HR 98 | Temp 98.0°F | Ht 65.0 in | Wt 144.1 lb

## 2013-02-10 VITALS — BP 130/82 | HR 96 | Temp 97.1°F | Resp 16 | Ht 64.0 in | Wt 143.0 lb

## 2013-02-10 DIAGNOSIS — C773 Secondary and unspecified malignant neoplasm of axilla and upper limb lymph nodes: Secondary | ICD-10-CM

## 2013-02-10 DIAGNOSIS — C50919 Malignant neoplasm of unspecified site of unspecified female breast: Secondary | ICD-10-CM

## 2013-02-10 DIAGNOSIS — C9000 Multiple myeloma not having achieved remission: Secondary | ICD-10-CM

## 2013-02-10 HISTORY — DX: Personal history of irradiation: Z92.3

## 2013-02-10 HISTORY — DX: Atelectasis: J98.11

## 2013-02-10 NOTE — Progress Notes (Signed)
History: Patient returns for followup status post right modified mastectomy. She reports she is doing well with no pain in her mastectomy site and improved pain from her myeloma involving her spine. She's had very minimal clear a JP drainage  Exam: BP 130/82  Pulse 96  Temp(Src) 97.1 F (36.2 C) (Temporal)  Resp 16  Ht 5\' 4"  (1.626 m)  Wt 143 lb (64.864 kg)  BMI 24.53 kg/m2 General: No distress Incision: Her mastectomy incision is nicely healed. I removed the drain. The remainder of the staples were removed.  Assessment and plan: Doing very well following modified mastectomy without complication. She will return in 4 weeks.

## 2013-02-10 NOTE — Progress Notes (Signed)
.    Ms Temkin here for assessement following radiation to her T6-l1 spinelower thoracic pain 9-10.  She states that she continues to have pain in the lower thoracic pain with radiating around the right to mid-abdominal region and grades this pain as a level 9-10 on a scale of 0-10.  She continues on Dexamethasone 4 mg once daily po.  One of her main concerns is her large, firm abdomen which she states is not her norm.  She states she is not constipated, but states that no one has ever addressed her concerns about her abdomen because it is more painful and she feels she has more bloating.

## 2013-02-12 ENCOUNTER — Encounter: Payer: Self-pay | Admitting: Radiation Oncology

## 2013-02-12 NOTE — Progress Notes (Signed)
Radiation Oncology         [phone] ________________________________  Name: Jacqueline Rivas MRN: [id number]  Date: 02/10/2013  DOB: 1932/08/18  Follow-Up Visit Note  outpatient  CC: GUEST, Loretha Stapler, MD  Josph Macho, MD   Diagnosis: Spinal lesions consistent with Multiple Myeloma  Indication for treatment: palliative  Radiation treatment dates: 12/15/2012-12/21/2012  Site/dose: T6-L1 / 18.75 Gy in 5 fractions  Narrative:  The patient returns today for routine follow-up  She states that she continues to have pain in the lower spine - seems more in the L than the T spine - radiating around the right to mid-abdominal region and grades this pain as a level 9-10 on a scale of 0-10. On detailed questioning, it seems that her low T spine pain improved after RT, but low L spine pain persists.  She continues on Dexamethasone 4 mg once daily po. One of her main concerns is her large, firm abdomen which she states is not her norm. She states she is not constipated, but states that no one has ever addressed her concerns about her abdomen because it is more painful and she feels very bloated.  She is not sure if the spine pain is actually STARTING In the abdomen, not sure which direction it is radiating.  Recovering from mastectomy for her breast cancer.                            ALLERGIES:  has No Known Allergies.  Meds: Current Outpatient Prescriptions  Medication Sig Dispense Refill  . calcium carbonate (OS-CAL - DOSED IN MG OF ELEMENTAL CALCIUM) 1250 MG tablet Take 1,250 mg by mouth daily as needed (on occasion).       . Coenzyme Q10 (CO Q-10) 100 MG CAPS Take 100 mg by mouth daily as needed (on occasion).       Marland Kitchen dexamethasone (DECADRON) 4 MG tablet Take 4 mg by mouth daily with breakfast.      . fentaNYL (DURAGESIC) 12 MCG/HR Place 1 patch (12.5 mcg total) onto the skin every 3 (three) days.  10 patch  0  . HYDROmorphone (DILAUDID) 2 MG tablet Take 1 tablet (2 mg total) by mouth  every 6 (six) hours as needed for pain (takes along with fentanyl patch).  60 tablet  0  . lansoprazole (PREVACID) 30 MG capsule Take 1 capsule (30 mg total) by mouth daily.  30 capsule  4  . magnesium oxide (MAG-OX) 400 MG tablet Take 400 mg by mouth daily as needed (constipation).      . polyethylene glycol (MIRALAX / GLYCOLAX) packet Take 17 g by mouth 2 (two) times daily as needed. For constipation      . Potassium 99 MG TABS Take 99 mg by mouth daily as needed (on occasion).      . triamterene-hydrochlorothiazide (MAXZIDE-25) 37.5-25 MG per tablet Take 1 each (1 tablet total) by mouth daily.  30 tablet  4  . Zinc 50 MG CAPS Take 50 mg by mouth daily as needed (on occasion).       No current facility-administered medications for this encounter.    Physical Findings: The patient is in no acute distress. Patient is alert and oriented.  height is 5\' 5"  (1.651 m) and weight is 144 lb 1.6 oz (65.363 kg). Her temperature is 98 F (36.7 C). Her blood pressure is 133/77 and her pulse is 98. Abdomen quite protruberant, firm. No rigidity or guarding  to palpation. Pain in spine is in the mid Lumbar region.  Lab Findings: Lab Results  Component Value Date   WBC 8.0 01/26/2013   HGB 11.0* 01/26/2013   HCT 32.9* 01/26/2013   MCV 94.8 01/26/2013   PLT 232 01/26/2013      Radiographic Findings: Dg Chest 2 View  01/23/2013  *RADIOLOGY REPORT*  Clinical Data: History of hypertension, right mastectomy.  Preop.  CHEST - 2 VIEW  Comparison: 11/11/2012  Findings: The heart is enlarged.  There are streaky densities in the lung bases bilaterally, most likely representing subsegmental atelectasis.  There are no focal consolidations or pleural effusions.  No pulmonary edema.  The patient has had previous vertebral plasty at T11.  Other wedge compression fractures are identified at T12, T10, T7, and T5.  The fracture at T10 appears new since MRI 12/12/2012.  IMPRESSION:  1.  Bilateral subsegmental atelectasis. 2.   No evidence for new consolidation or pulmonary edema. 3.  Cardiomegaly. 4.  New wedge compression fracture at T10.   Original Report Authenticated By: Norva Pavlov, M.D.     Impression/Plan:  Main concern - new onset protuberance, bloating of abdomen. Denies constipation.  No acute abdomen on exam. Vitals satisfactory. Offered to order plain films today, patient would rather see Dr. Myna Hidalgo first, next week.  I think we need to find out the cause of her abdominal swelling and see if there is a solution to resolve this. If spine pain still persists in Lumbar area after addressed abdomen distention, I could give RT to the lower lumbar region (possible pain in back is related to marrow changes on MRI.  )  I will see her back PRN - for now, she would like to prioritize seeing DR. Ennever and pursuing workup of abdominal issues with him (as stated above, declined initial scans to be ordered today).  As for her breast cancer, given her overall clinical situation, I do not see a reasonable role for post-mastectomy radiation.  I spent 20 minutes minutes face to face with the patient and more than 50% of that time was spent in counseling and/or coordination of care. _____________________________________   Lonie Peak, MD

## 2013-02-16 ENCOUNTER — Other Ambulatory Visit: Payer: Medicare Other | Admitting: Lab

## 2013-02-16 ENCOUNTER — Ambulatory Visit (HOSPITAL_BASED_OUTPATIENT_CLINIC_OR_DEPARTMENT_OTHER): Payer: Medicare Other

## 2013-02-16 ENCOUNTER — Ambulatory Visit: Payer: Medicare Other | Admitting: Hematology & Oncology

## 2013-02-16 ENCOUNTER — Encounter: Payer: Self-pay | Admitting: Hematology & Oncology

## 2013-02-16 ENCOUNTER — Ambulatory Visit (HOSPITAL_BASED_OUTPATIENT_CLINIC_OR_DEPARTMENT_OTHER): Payer: Medicare Other | Admitting: Hematology & Oncology

## 2013-02-16 ENCOUNTER — Ambulatory Visit: Payer: Medicare Other

## 2013-02-16 ENCOUNTER — Ambulatory Visit (HOSPITAL_BASED_OUTPATIENT_CLINIC_OR_DEPARTMENT_OTHER): Payer: Medicare Other | Admitting: Lab

## 2013-02-16 VITALS — BP 107/68 | HR 78 | Temp 98.1°F | Resp 16

## 2013-02-16 VITALS — BP 138/77 | HR 103 | Temp 97.9°F | Resp 16 | Ht 64.0 in | Wt 144.0 lb

## 2013-02-16 DIAGNOSIS — C50911 Malignant neoplasm of unspecified site of right female breast: Secondary | ICD-10-CM

## 2013-02-16 DIAGNOSIS — C773 Secondary and unspecified malignant neoplasm of axilla and upper limb lymph nodes: Secondary | ICD-10-CM | POA: Diagnosis not present

## 2013-02-16 DIAGNOSIS — C9000 Multiple myeloma not having achieved remission: Secondary | ICD-10-CM

## 2013-02-16 DIAGNOSIS — C50919 Malignant neoplasm of unspecified site of unspecified female breast: Secondary | ICD-10-CM

## 2013-02-16 DIAGNOSIS — Z171 Estrogen receptor negative status [ER-]: Secondary | ICD-10-CM

## 2013-02-16 DIAGNOSIS — R609 Edema, unspecified: Secondary | ICD-10-CM | POA: Diagnosis not present

## 2013-02-16 DIAGNOSIS — Z5112 Encounter for antineoplastic immunotherapy: Secondary | ICD-10-CM

## 2013-02-16 LAB — CBC WITH DIFFERENTIAL (CANCER CENTER ONLY)
BASO#: 0 10*3/uL (ref 0.0–0.2)
Eosinophils Absolute: 0.1 10*3/uL (ref 0.0–0.5)
HGB: 12.9 g/dL (ref 11.6–15.9)
LYMPH#: 1.3 10*3/uL (ref 0.9–3.3)
MCH: 32.5 pg (ref 26.0–34.0)
MONO%: 8.7 % (ref 0.0–13.0)
NEUT#: 6 10*3/uL (ref 1.5–6.5)
Platelets: 239 10*3/uL (ref 145–400)
RBC: 3.97 10*6/uL (ref 3.70–5.32)
WBC: 8 10*3/uL (ref 3.9–10.0)

## 2013-02-16 LAB — CMP (CANCER CENTER ONLY)
Albumin: 3 g/dL — ABNORMAL LOW (ref 3.3–5.5)
Alkaline Phosphatase: 104 U/L — ABNORMAL HIGH (ref 26–84)
CO2: 28 mEq/L (ref 18–33)
Calcium: 10.1 mg/dL (ref 8.0–10.3)
Chloride: 93 mEq/L — ABNORMAL LOW (ref 98–108)
Glucose, Bld: 107 mg/dL (ref 73–118)
Total Protein: 8.7 g/dL — ABNORMAL HIGH (ref 6.4–8.1)

## 2013-02-16 MED ORDER — TRASTUZUMAB CHEMO INJECTION 440 MG
8.0000 mg/kg | Freq: Once | INTRAVENOUS | Status: AC
  Administered 2013-02-16: 567 mg via INTRAVENOUS
  Filled 2013-02-16: qty 27

## 2013-02-16 MED ORDER — ACETAMINOPHEN 325 MG PO TABS
650.0000 mg | ORAL_TABLET | Freq: Once | ORAL | Status: AC
  Administered 2013-02-16: 650 mg via ORAL

## 2013-02-16 MED ORDER — SODIUM CHLORIDE 0.9 % IV SOLN
Freq: Once | INTRAVENOUS | Status: AC
  Administered 2013-02-16: 11:00:00 via INTRAVENOUS

## 2013-02-16 MED ORDER — DEXAMETHASONE SODIUM PHOSPHATE 20 MG/5ML IJ SOLN
20.0000 mg | Freq: Once | INTRAMUSCULAR | Status: AC
  Administered 2013-02-16: 20 mg via INTRAVENOUS
  Filled 2013-02-16: qty 5

## 2013-02-16 MED ORDER — ONDANSETRON HCL 8 MG PO TABS
8.0000 mg | ORAL_TABLET | Freq: Once | ORAL | Status: AC
  Administered 2013-02-16: 8 mg via ORAL

## 2013-02-16 MED ORDER — BORTEZOMIB CHEMO SQ INJECTION 3.5 MG (2.5MG/ML)
1.3000 mg/m2 | Freq: Once | INTRAMUSCULAR | Status: AC
  Administered 2013-02-16: 2.25 mg via SUBCUTANEOUS
  Filled 2013-02-16: qty 2.25

## 2013-02-16 MED ORDER — DIPHENHYDRAMINE HCL 25 MG PO CAPS
50.0000 mg | ORAL_CAPSULE | Freq: Once | ORAL | Status: AC
  Administered 2013-02-16: 25 mg via ORAL

## 2013-02-16 MED ORDER — SODIUM CHLORIDE 0.9 % IV SOLN
840.0000 mg | Freq: Once | INTRAVENOUS | Status: AC
  Administered 2013-02-16: 840 mg via INTRAVENOUS
  Filled 2013-02-16: qty 28

## 2013-02-16 MED ORDER — ZOLEDRONIC ACID 4 MG/100ML IV SOLN
4.0000 mg | Freq: Once | INTRAVENOUS | Status: AC
  Administered 2013-02-16: 4 mg via INTRAVENOUS
  Filled 2013-02-16: qty 100

## 2013-02-16 NOTE — Patient Instructions (Addendum)
[facility] [address] [place], [place]  [address] Discharge Instructions for Patients Receiving Chemotherapy   Today you received the following chemotherapy agents:   Velcade, Herceptin, Perjeta.  To help prevent nausea and vomiting after your treatment, we encourage you to take your nausea medications as ordered. If you develop nausea and vomiting that is not controlled by your nausea medication, call the clinic. If it is after clinic hours your family physician or the after hours number for the clinic or go to the Emergency Department.   BELOW ARE SYMPTOMS THAT SHOULD BE REPORTED IMMEDIATELY:  *FEVER GREATER THAN 100.5 F  *CHILLS WITH OR WITHOUT FEVER  NAUSEA AND VOMITING THAT IS NOT CONTROLLED WITH YOUR NAUSEA MEDICATION  *UNUSUAL SHORTNESS OF BREATH  *UNUSUAL BRUISING OR BLEEDING  TENDERNESS IN MOUTH AND THROAT WITH OR WITHOUT PRESENCE OF ULCERS  *URINARY PROBLEMS  *BOWEL PROBLEMS  UNUSUAL RASH Items with * indicate a potential emergency and should be followed up as soon as possible.   Feel free to call the clinic you have any questions or concerns. The clinic phone number is [phone].

## 2013-02-16 NOTE — Progress Notes (Signed)
This office note has been dictated.

## 2013-02-17 NOTE — Progress Notes (Signed)
CC:   Jonita Albee, M.D.  DIAGNOSES: 1. Stage IIB (T2 N1 M0) invasive ductal carcinoma of the right breast-     estrogen receptor negative/HER2 positive. 2. IgA kappa myeloma.  CURRENT THERAPY: 1. Patient to start "adjuvant" therapy with Herceptin/Perjeta. 2. Velcade weekly (3 weeks on/1 week off). 3. Zometa 4 mg IV monthly. 4. Decadron 40 mg monthly.  INTERIM HISTORY:  Mr. Ruttan in for followup.  She did have her mastectomy.  This was done on May 9th.  The pathology report ([id number]) showed an invasive ductal carcinoma.  This measured 5 cm.  There was lymphovascular space invasion.  Deep margin was 0.1 cm.  She had 2 of 13 lymph nodes that were positive.  Her tumor was ER negative/PR negative/HER2 positive.  She recovered fairly well.  We have been dealing with the myeloma for right now.  She has responded to Velcade for her myeloma.  Her last myeloma studies done back in early April showed her monoclonal spike to be 2.49 g/dL.  IgA level was 2520 mg/dL.  Kappa light chains were 32.4 mg/dL.  She is feeling fairly well.  Her back bothers her a little bit, but not nearly as much as when we first saw her.  She did have kyphoplasty, which helped.  She had radiation also.  The patient complained of abdominal distention.  This appears to be more "bloating."  She does "swallow" a lot of air, she says.  I told to try some Gaviscon to see if this helps.  She seems to be getting around a little bit better.  She does have a cane.  I would have to say that her performance status currently is ECOG 2-3.  Her appetite is doing a little bit better.  She has had no nausea or vomiting.  She has had no diarrhea.  There may be a little bit of constipation.  Her leg swelling seems to be improving.  We did get her on some diuretics, which has helped.  PHYSICAL EXAMINATION:  General:  This is an elderly white female who is somewhat frail.  Vital Signs:  Temperature of 97.9, pulse  103, respiratory rate 16, blood pressure 138/77.  Weight is 144.  Head and neck:  Normocephalic, atraumatic skull.  There are no ocular or oral lesions.  There are no palpable cervical or supraclavicular lymph nodes. Lungs:  Clear bilaterally.  Cardiac:  Regular rate and rhythm with a normal S1 and S2.  There is a 1/6 systolic ejection murmur.  Abdomen: Some slight distention.  This is mostly periumbilical.  There is some slight tenderness to palpation on the right side of the abdomen.  There is no fluid wave.  There is no distinct abdominal mass.  There is no palpable hepatosplenomegaly.  Extremities:  1+ edema in her legs.  She has 4/5 strength in her legs bilaterally.  Skin:  No rashes.  Back: Some slight kyphosis.  LABS:  White count of 8, hemoglobin 12.9, hematocrit 39.1, platelet count 259.  Sodium is 136, potassium 4.2, BUN 17, creatinine 0.7.  Total protein 8.7 with an albumin of 3.0.  Her echocardiogram shows a left ventricular ejection fraction of, I think, 60-65%.  IMPRESSION:  Ms. Durall is a 79 year old white female with 2 separate oncologic issues.  She now has stage IIB ductal carcinoma of the right breast.  This, unfortunately, is ER negative, but __________positive.  I just do not think that she is a great candidate for adjuvant chemotherapy.  However,  given that her tumor is HER2 positive, I think that we could consider using Herceptin/Perjeta in the adjuvant setting. I think this would be appropriate.  I would use double HER2 blockade, given that I cannot use systemic chemotherapy.  We will continue to treat her myeloma.  I believe that we are making improvements with her myeloma.  She is tolerating the Velcade well.  At some point, we may want to consider putting her on Revlimid.  I think this would be reasonable for the myeloma portion of her therapy.  Again, this is a unique situation.  We are trying to deal with both malignancies and giving her a good  quality of life.  Dr. Johna Sheriff did a great job at surgery with her mastectomy.  I think 1 question is whether not she is going to need radiation therapy because of the deep margin being so close.  We may need to talk to Radiation Oncology about this.  We will go ahead and plan to get her back in another month so that we can treat her and see how she is doing.    ______________________________ Josph Macho, M.D. PRE/MEDQ  D:  02/16/2013  T:  02/17/2013  Job:  [id number]

## 2013-02-20 ENCOUNTER — Telehealth (HOSPITAL_COMMUNITY): Payer: Self-pay | Admitting: Interventional Radiology

## 2013-02-21 LAB — PROTEIN ELECTROPHORESIS, SERUM, WITH REFLEX
Beta 2: 35.6 % — ABNORMAL HIGH (ref 3.2–6.5)
Beta Globulin: 5.5 % (ref 4.7–7.2)
Gamma Globulin: 1.9 % — ABNORMAL LOW (ref 11.1–18.8)

## 2013-02-21 LAB — IGG, IGA, IGM
IgA: 3410 mg/dL — ABNORMAL HIGH (ref 69–380)
IgG (Immunoglobin G), Serum: 253 mg/dL — ABNORMAL LOW (ref 690–1700)
IgM, Serum: 34 mg/dL — ABNORMAL LOW (ref 52–322)

## 2013-02-21 LAB — KAPPA/LAMBDA LIGHT CHAINS
Kappa free light chain: 69.9 mg/dL — ABNORMAL HIGH (ref 0.33–1.94)
Lambda Free Lght Chn: 0.68 mg/dL (ref 0.57–2.63)

## 2013-02-23 ENCOUNTER — Ambulatory Visit (HOSPITAL_BASED_OUTPATIENT_CLINIC_OR_DEPARTMENT_OTHER): Payer: Medicare Other

## 2013-02-23 ENCOUNTER — Other Ambulatory Visit: Payer: Medicare Other | Admitting: Lab

## 2013-02-23 VITALS — BP 155/99 | HR 100 | Temp 97.1°F | Resp 20

## 2013-02-23 DIAGNOSIS — Z5112 Encounter for antineoplastic immunotherapy: Secondary | ICD-10-CM | POA: Diagnosis not present

## 2013-02-23 DIAGNOSIS — C9000 Multiple myeloma not having achieved remission: Secondary | ICD-10-CM | POA: Diagnosis not present

## 2013-02-23 MED ORDER — BORTEZOMIB CHEMO SQ INJECTION 3.5 MG (2.5MG/ML)
1.3000 mg/m2 | Freq: Once | INTRAMUSCULAR | Status: AC
  Administered 2013-02-23: 2.25 mg via SUBCUTANEOUS
  Filled 2013-02-23: qty 2.25

## 2013-02-23 MED ORDER — ONDANSETRON HCL 8 MG PO TABS
8.0000 mg | ORAL_TABLET | Freq: Once | ORAL | Status: AC
  Administered 2013-02-23: 8 mg via ORAL

## 2013-02-23 NOTE — Patient Instructions (Signed)

## 2013-03-02 ENCOUNTER — Other Ambulatory Visit: Payer: Medicare Other | Admitting: Lab

## 2013-03-02 ENCOUNTER — Ambulatory Visit (HOSPITAL_BASED_OUTPATIENT_CLINIC_OR_DEPARTMENT_OTHER): Payer: Medicare Other

## 2013-03-02 VITALS — BP 151/93 | HR 97 | Temp 98.6°F | Resp 20

## 2013-03-02 DIAGNOSIS — C9000 Multiple myeloma not having achieved remission: Secondary | ICD-10-CM

## 2013-03-02 DIAGNOSIS — Z5112 Encounter for antineoplastic immunotherapy: Secondary | ICD-10-CM

## 2013-03-02 LAB — CBC WITH DIFFERENTIAL (CANCER CENTER ONLY)
BASO#: 0.1 10*3/uL (ref 0.0–0.2)
Eosinophils Absolute: 0.1 10*3/uL (ref 0.0–0.5)
HGB: 12 g/dL (ref 11.6–15.9)
LYMPH%: 10.2 % — ABNORMAL LOW (ref 14.0–48.0)
MCH: 32.6 pg (ref 26.0–34.0)
MCV: 98 fL (ref 81–101)
MONO#: 0.7 10*3/uL (ref 0.1–0.9)
NEUT#: 6.5 10*3/uL (ref 1.5–6.5)
Platelets: 237 10*3/uL (ref 145–400)
RBC: 3.68 10*6/uL — ABNORMAL LOW (ref 3.70–5.32)
WBC: 8.1 10*3/uL (ref 3.9–10.0)

## 2013-03-02 MED ORDER — ONDANSETRON HCL 8 MG PO TABS
8.0000 mg | ORAL_TABLET | Freq: Once | ORAL | Status: AC
  Administered 2013-03-02: 8 mg via ORAL

## 2013-03-02 MED ORDER — BORTEZOMIB CHEMO SQ INJECTION 3.5 MG (2.5MG/ML)
1.3000 mg/m2 | Freq: Once | INTRAMUSCULAR | Status: AC
  Administered 2013-03-02: 2.25 mg via SUBCUTANEOUS
  Filled 2013-03-02: qty 2.25

## 2013-03-02 NOTE — Patient Instructions (Addendum)

## 2013-03-14 ENCOUNTER — Encounter (INDEPENDENT_AMBULATORY_CARE_PROVIDER_SITE_OTHER): Payer: Medicare Other | Admitting: General Surgery

## 2013-03-17 ENCOUNTER — Ambulatory Visit (HOSPITAL_BASED_OUTPATIENT_CLINIC_OR_DEPARTMENT_OTHER): Payer: Medicare Other | Admitting: Hematology & Oncology

## 2013-03-17 ENCOUNTER — Ambulatory Visit (HOSPITAL_BASED_OUTPATIENT_CLINIC_OR_DEPARTMENT_OTHER): Payer: Medicare Other | Admitting: Lab

## 2013-03-17 ENCOUNTER — Ambulatory Visit (HOSPITAL_BASED_OUTPATIENT_CLINIC_OR_DEPARTMENT_OTHER): Payer: Medicare Other

## 2013-03-17 VITALS — BP 137/81 | HR 86 | Temp 98.0°F | Resp 16 | Ht 64.0 in | Wt 142.0 lb

## 2013-03-17 DIAGNOSIS — C9000 Multiple myeloma not having achieved remission: Secondary | ICD-10-CM

## 2013-03-17 DIAGNOSIS — C773 Secondary and unspecified malignant neoplasm of axilla and upper limb lymph nodes: Secondary | ICD-10-CM

## 2013-03-17 DIAGNOSIS — Z171 Estrogen receptor negative status [ER-]: Secondary | ICD-10-CM

## 2013-03-17 DIAGNOSIS — C50919 Malignant neoplasm of unspecified site of unspecified female breast: Secondary | ICD-10-CM | POA: Diagnosis not present

## 2013-03-17 DIAGNOSIS — Z5112 Encounter for antineoplastic immunotherapy: Secondary | ICD-10-CM | POA: Diagnosis not present

## 2013-03-17 DIAGNOSIS — C50912 Malignant neoplasm of unspecified site of left female breast: Secondary | ICD-10-CM

## 2013-03-17 LAB — CMP (CANCER CENTER ONLY)
ALT(SGPT): 53 U/L — ABNORMAL HIGH (ref 10–47)
AST: 46 U/L — ABNORMAL HIGH (ref 11–38)
Creat: 0.9 mg/dl (ref 0.6–1.2)
Total Bilirubin: 1 mg/dl (ref 0.20–1.60)

## 2013-03-17 LAB — CBC WITH DIFFERENTIAL (CANCER CENTER ONLY)
Eosinophils Absolute: 0 10*3/uL (ref 0.0–0.5)
HCT: 34.9 % (ref 34.8–46.6)
LYMPH%: 14.7 % (ref 14.0–48.0)
MCH: 32.3 pg (ref 26.0–34.0)
MCV: 98 fL (ref 81–101)
MONO#: 0.7 10*3/uL (ref 0.1–0.9)
MONO%: 11.7 % (ref 0.0–13.0)
NEUT%: 71.5 % (ref 39.6–80.0)
RBC: 3.56 10*6/uL — ABNORMAL LOW (ref 3.70–5.32)
WBC: 5.6 10*3/uL (ref 3.9–10.0)

## 2013-03-17 MED ORDER — ZOLEDRONIC ACID 4 MG/5ML IV CONC: 4.0000 mg | Freq: Once | INTRAVENOUS | Status: DC

## 2013-03-17 MED ORDER — SODIUM CHLORIDE 0.9 % IV SOLN
Freq: Once | INTRAVENOUS | Status: AC
  Administered 2013-03-17: 10:00:00 via INTRAVENOUS

## 2013-03-17 MED ORDER — DIPHENHYDRAMINE HCL 25 MG PO CAPS
50.0000 mg | ORAL_CAPSULE | Freq: Once | ORAL | Status: AC
  Administered 2013-03-17: 50 mg via ORAL

## 2013-03-17 MED ORDER — DEXAMETHASONE SODIUM PHOSPHATE 20 MG/5ML IJ SOLN
40.0000 mg | Freq: Once | INTRAMUSCULAR | Status: AC
  Administered 2013-03-17: 40 mg via INTRAVENOUS
  Filled 2013-03-17: qty 10

## 2013-03-17 MED ORDER — ZOLEDRONIC ACID 4 MG/100ML IV SOLN
4.0000 mg | Freq: Once | INTRAVENOUS | Status: AC
  Administered 2013-03-17: 4 mg via INTRAVENOUS
  Filled 2013-03-17: qty 100

## 2013-03-17 MED ORDER — TRASTUZUMAB CHEMO INJECTION 440 MG
6.0000 mg/kg | Freq: Once | INTRAVENOUS | Status: AC
  Administered 2013-03-17: 420 mg via INTRAVENOUS
  Filled 2013-03-17: qty 20

## 2013-03-17 MED ORDER — PERTUZUMAB CHEMO INJECTION 420 MG/14ML
420.0000 mg | Freq: Once | INTRAVENOUS | Status: AC
  Administered 2013-03-17: 420 mg via INTRAVENOUS
  Filled 2013-03-17: qty 14

## 2013-03-17 MED ORDER — ONDANSETRON HCL 8 MG PO TABS
8.0000 mg | ORAL_TABLET | Freq: Once | ORAL | Status: AC
  Administered 2013-03-17: 8 mg via ORAL

## 2013-03-17 MED ORDER — BORTEZOMIB CHEMO SQ INJECTION 3.5 MG (2.5MG/ML)
1.3000 mg/m2 | Freq: Once | INTRAMUSCULAR | Status: AC
  Administered 2013-03-17: 2.25 mg via SUBCUTANEOUS
  Filled 2013-03-17: qty 2.25

## 2013-03-17 MED ORDER — ACETAMINOPHEN 325 MG PO TABS
650.0000 mg | ORAL_TABLET | Freq: Once | ORAL | Status: AC
  Administered 2013-03-17: 650 mg via ORAL

## 2013-03-17 NOTE — Progress Notes (Signed)
This office note has been dictated.

## 2013-03-17 NOTE — Patient Instructions (Addendum)
Zoledronic Acid injection (Hypercalcemia, Oncology) What is this medicine? ZOLEDRONIC ACID (ZOE le dron ik AS id) lowers the amount of calcium loss from bone. It is used to treat too much calcium in your blood from cancer. It is also used to prevent complications of cancer that has spread to the bone. This medicine may be used for other purposes; ask your health care provider or pharmacist if you have questions. What should I tell my health care provider before I take this medicine? They need to know if you have any of these conditions: -aspirin-sensitive asthma -dental disease -kidney disease -an unusual or allergic reaction to zoledronic acid, other medicines, foods, dyes, or preservatives -pregnant or trying to get pregnant -breast-feeding How should I use this medicine? This medicine is for infusion into a vein. It is given by a health care professional in a hospital or clinic setting. Talk to your pediatrician regarding the use of this medicine in children. Special care may be needed. Overdosage: If you think you have taken too much of this medicine contact a poison control center or emergency room at once. NOTE: This medicine is only for you. Do not share this medicine with others. What if I miss a dose? It is important not to miss your dose. Call your doctor or health care professional if you are unable to keep an appointment. What may interact with this medicine? -certain antibiotics given by injection -NSAIDs, medicines for pain and inflammation, like ibuprofen or naproxen -some diuretics like bumetanide, furosemide -teriparatide -thalidomide This list may not describe all possible interactions. Give your health care provider a list of all the medicines, herbs, non-prescription drugs, or dietary supplements you use. Also tell them if you smoke, drink alcohol, or use illegal drugs. Some items may interact with your medicine. What should I watch for while using this medicine? Visit  your doctor or health care professional for regular checkups. It may be some time before you see the benefit from this medicine. Do not stop taking your medicine unless your doctor tells you to. Your doctor may order blood tests or other tests to see how you are doing. Women should inform their doctor if they wish to become pregnant or think they might be pregnant. There is a potential for serious side effects to an unborn child. Talk to your health care professional or pharmacist for more information. You should make sure that you get enough calcium and vitamin D while you are taking this medicine. Discuss the foods you eat and the vitamins you take with your health care professional. Some people who take this medicine have severe bone, joint, and/or muscle pain. This medicine may also increase your risk for a broken thigh bone. Tell your doctor right away if you have pain in your upper leg or groin. Tell your doctor if you have any pain that does not go away or that gets worse. What side effects may I notice from receiving this medicine? Side effects that you should report to your doctor or health care professional as soon as possible: -allergic reactions like skin rash, itching or hives, swelling of the face, lips, or tongue -anxiety, confusion, or depression -breathing problems -changes in vision -feeling faint or lightheaded, falls -jaw burning, cramping, pain -muscle cramps, stiffness, or weakness -trouble passing urine or change in the amount of urine Side effects that usually do not require medical attention (report to your doctor or health care professional if they continue or are bothersome): -bone, joint, or muscle pain -  fever -hair loss -irritation at site where injected -loss of appetite -nausea, vomiting -stomach upset -tired This list may not describe all possible side effects. Call your doctor for medical advice about side effects. You may report side effects to FDA at  1-800-FDA-1088. Where should I keep my medicine? This drug is given in a hospital or clinic and will not be stored at home. NOTE: This sheet is a summary. It may not cover all possible information. If you have questions about this medicine, talk to your doctor, pharmacist, or health care provider.  2012, [employer]/Gold Standard. (04/03/2011 9:06:58 AM)Pertuzumab injection What is this medicine? PERTUZUMAB is a monoclonal antibody that targets a protein called HER2. HER2 is found in some breast cancers. This medicine can stop cancer cell growth. This medicine is used with other cancer treatments. This medicine may be used for other purposes; ask your health care provider or pharmacist if you have questions. What should I tell my health care provider before I take this medicine? They need to know if you have any of these conditions: -heart disease -heart failure -high blood pressure -history of irregular heart beat -recent or ongoing radiation therapy -an unusual or allergic reaction to pertuzumab, other medicines, foods, dyes, or preservatives -pregnant or trying to get pregnant -breast-feeding How should I use this medicine? This medicine is for infusion into a vein. It is given by a health care professional in a hospital or clinic setting. Talk to your pediatrician regarding the use of this medicine in children. Special care may be needed. Overdosage: If you think you've taken too much of this medicine contact a poison control center or emergency room at once. Overdosage: If you think you have taken too much of this medicine contact a poison control center or emergency room at once. NOTE: This medicine is only for you. Do not share this medicine with others. What if I miss a dose? It is important not to miss your dose. Call your doctor or health care professional if you are unable to keep an appointment. What may interact with this medicine? Interactions are not expected. Give your health  care provider a list of all the medicines, herbs, non-prescription drugs, or dietary supplements you use. Also tell them if you smoke, drink alcohol, or use illegal drugs. Some items may interact with your medicine. This list may not describe all possible interactions. Give your health care provider a list of all the medicines, herbs, non-prescription drugs, or dietary supplements you use. Also tell them if you smoke, drink alcohol, or use illegal drugs. Some items may interact with your medicine. What should I watch for while using this medicine? Your condition will be monitored carefully while you are receiving this medicine. Report any side effects. Continue your course of treatment even though you feel ill unless your doctor tells you to stop. Do not become pregnant while taking this medicine. Women should inform their doctor if they wish to become pregnant or think they might be pregnant. There is a potential for serious side effects to an unborn child. Talk to your health care professional or pharmacist for more information. Do not breast-feed an infant while taking this medicine. Call your doctor or health care professional for advice if you get a fever, chills or sore throat, or other symptoms of a cold or flu. Do not treat yourself. Try to avoid being around people who are sick. You may experience fever, chills, and headache during the infusion. Report any side effects during the  infusion to your health care professional. What side effects may I notice from receiving this medicine? Side effects that you should report to your doctor or health care professional as soon as possible: -breathing problems -chest pain or palpitations -dizziness -feeling faint or lightheaded -fever or chills -skin rash, itching or hives -sore throat -swelling of the face, lips, or tongue -swelling of the legs or ankles -unusually weak or tired  Side effects that usually do not require medical attention (Report  these to your doctor or health care professional if they continue or are bothersome.): -diarrhea -hair loss -nausea, vomiting -tiredness This list may not describe all possible side effects. Call your doctor for medical advice about side effects. You may report side effects to FDA at 1-800-FDA-1088. Where should I keep my medicine? This drug is given in a hospital or clinic and will not be stored at home. NOTE: This sheet is a summary. It may not cover all possible information. If you have questions about this medicine, talk to your doctor, pharmacist, or health care provider.  2013, [employer]/Gold Standard. (04/01/2011 4:45:56 PM) Trastuzumab injection for infusion What is this medicine? TRASTUZUMAB (tras TOO zoo mab) is a monoclonal antibody. It targets a protein called HER2. This protein is found in some stomach and breast cancers. This medicine can stop cancer cell growth. This medicine may be used with other cancer treatments. This medicine may be used for other purposes; ask your health care provider or pharmacist if you have questions. What should I tell my health care provider before I take this medicine? They need to know if you have any of these conditions: -heart disease -heart failure -infection (especially a virus infection such as chickenpox, cold sores, or herpes) -lung or breathing disease, like asthma -recent or ongoing radiation therapy -an unusual or allergic reaction to trastuzumab, benzyl alcohol, or other medications, foods, dyes, or preservatives -pregnant or trying to get pregnant -breast-feeding How should I use this medicine? This drug is given as an infusion into a vein. It is administered in a hospital or clinic by a specially trained health care professional. Talk to your pediatrician regarding the use of this medicine in children. This medicine is not approved for use in children. Overdosage: If you think you have taken too much of this medicine contact a  poison control center or emergency room at once. NOTE: This medicine is only for you. Do not share this medicine with others. What if I miss a dose? It is important not to miss a dose. Call your doctor or health care professional if you are unable to keep an appointment. What may interact with this medicine? -cyclophosphamide -doxorubicin -warfarin This list may not describe all possible interactions. Give your health care provider a list of all the medicines, herbs, non-prescription drugs, or dietary supplements you use. Also tell them if you smoke, drink alcohol, or use illegal drugs. Some items may interact with your medicine. What should I watch for while using this medicine? Visit your doctor for checks on your progress. Report any side effects. Continue your course of treatment even though you feel ill unless your doctor tells you to stop. Call your doctor or health care professional for advice if you get a fever, chills or sore throat, or other symptoms of a cold or flu. Do not treat yourself. Try to avoid being around people who are sick. You may experience fever, chills and shaking during your first infusion. These effects are usually mild and can be treated  with other medicines. Report any side effects during the infusion to your health care professional. Fever and chills usually do not happen with later infusions. What side effects may I notice from receiving this medicine? Side effects that you should report to your doctor or other health care professional as soon as possible: -breathing difficulties -chest pain or palpitations -cough -dizziness or fainting -fever or chills, sore throat -skin rash, itching or hives -swelling of the legs or ankles -unusually weak or tired Side effects that usually do not require medical attention (report to your doctor or other health care professional if they continue or are bothersome): -loss of appetite -headache -muscle aches -nausea This  list may not describe all possible side effects. Call your doctor for medical advice about side effects. You may report side effects to FDA at 1-800-FDA-1088. Where should I keep my medicine? This drug is given in a hospital or clinic and will not be stored at home. NOTE: This sheet is a summary. It may not cover all possible information. If you have questions about this medicine, talk to your doctor, pharmacist, or health care provider.  2012, [employer]/Gold Standard. (08/09/2009 1:43:15 PM)

## 2013-03-18 NOTE — Progress Notes (Signed)
CC:   Jonita Albee, M.D.  DIAGNOSES: 1. Stage IIB (T2 N1 M0) ductal carcinoma of the right breast, HER2     positive/ER negative. 2. IgA kappa myeloma.  CURRENT THERAPY: 1. Adjuvant therapy with Herceptin/Perjeta. 2. Weekly Velcade. 3. Zometa 4 mg IV monthly. 4. Decadron 40 mg IV/p.o. q.month.  INTERIM HISTORY:  Ms. Leisure comes in for her followup.  She is doing fairly well.  She has tolerated the Herceptin/Perjeta well.  She has had no problems with cough or shortness of breath.  She still has some abdominal distention.  I am not sure as to what is causing this.  We have evaluated this fairly aggressively without any etiology being found.  The myeloma is doing well.  She is responding to treatment.  She had her last monoclonal studies done about 4 weeks go.  Her M spike was down to 2.489 g/dL.  Her IgA level was 3410 mg/dL.  Kappa light chain was 70 mg/dL.  She is not having much in the way of back discomfort.  She has had some occasional leg swelling.  She says that she is taking Decadron daily. We will see about trying to get her off this.  She gets Decadron monthly with the chemotherapy.  I gave her a program to taper the Decadron.  She continues on the fentanyl patch.  This seems to be helping her.  She has had no problems with bowels or bladder.  She has had occasional bouts of diarrhea.  There has been no cough.  She has had no swallowing difficulties.  PHYSICAL EXAMINATION:  General:  This is an elderly but well-nourished white female in no obvious distress.  Vital signs:  Show temperature of 98, pulse 86, respiratory rate 16, blood pressure 137/81.  Weight is 142.  Head and neck:  Shows a normocephalic, atraumatic skull.  There are no ocular or oral lesions.  There are no palpable cervical or supraclavicular lymph nodes.  Lungs:  Clear bilaterally.  Cardiac: Regular rate and rhythm with a normal S1 and S2.  There are no murmurs, rubs or bruits.  Abdomen:   Soft with good bowel sounds.  There is no palpable abdominal mass.  There may be some slight distention.  She has no fluid wave.  There is no palpable hepatosplenomegaly.  Breasts:  Exam shows right chest wall with well-healing mastectomy.  She may have a little bit of seroma in the lateral aspect of the mastectomy scar. There is no right axillary adenopathy.  Left breast is unremarkable. Back:  Exam shows some slight kyphosis.  There is no tenderness over the spine, ribs or hips.  Extremities:  Show some trace edema in her legs.  LABORATORY STUDIES:  White cell count is 5.6, hemoglobin 11.5, hematocrit 34.9, platelet count is 278.  IMPRESSION:  Ms. Renier is a very nice 79 year old white female.  She has 2 separate oncologic issues.  We are trying to deal with both.  She had a mastectomy for her breast cancer.  She had 2 positive lymph nodes. Her tumor is HER2 positive and ER negative.  I just did not feel that she would be a good candidate for systemic chemotherapy in the adjuvant setting.  However, using dual HER2 blockade with Herceptin/Perjeta I think would have been tolerated by her.  So far she has tolerated this well.  As far as the myeloma goes, we will have to see what her myeloma studies are.  This might be a little more resilient  than I thought.  Again, if we are not seeing a good response with the Velcade I may add Cytoxan to the Velcade.  We still have a "long ways to go" with Ms. Guilford with respect to treatment.  I think we just need to be patient.  We will plan to get her back in another few weeks for me to follow up with.    ______________________________ Josph Macho, M.D. PRE/MEDQ  D:  03/17/2013  T:  03/18/2013  Job:  [id number]

## 2013-03-21 LAB — IGG, IGA, IGM
IgA: 3110 mg/dL — ABNORMAL HIGH (ref 69–380)
IgG (Immunoglobin G), Serum: 213 mg/dL — ABNORMAL LOW (ref 690–1700)
IgM, Serum: 24 mg/dL — ABNORMAL LOW (ref 52–322)

## 2013-03-21 LAB — PROTEIN ELECTROPHORESIS, SERUM, WITH REFLEX
Beta 2: 38.3 % — ABNORMAL HIGH (ref 3.2–6.5)
Gamma Globulin: 2 % — ABNORMAL LOW (ref 11.1–18.8)
M-Spike, %: 2.81 g/dL

## 2013-03-21 LAB — KAPPA/LAMBDA LIGHT CHAINS
Kappa free light chain: 95.1 mg/dL — ABNORMAL HIGH (ref 0.33–1.94)
Lambda Free Lght Chn: 0.67 mg/dL (ref 0.57–2.63)

## 2013-03-23 ENCOUNTER — Other Ambulatory Visit: Payer: Medicare Other | Admitting: Lab

## 2013-03-23 ENCOUNTER — Ambulatory Visit (HOSPITAL_BASED_OUTPATIENT_CLINIC_OR_DEPARTMENT_OTHER): Payer: Medicare Other

## 2013-03-23 VITALS — BP 139/76 | HR 81 | Temp 98.1°F | Resp 18

## 2013-03-23 DIAGNOSIS — Z5112 Encounter for antineoplastic immunotherapy: Secondary | ICD-10-CM

## 2013-03-23 DIAGNOSIS — C9000 Multiple myeloma not having achieved remission: Secondary | ICD-10-CM | POA: Diagnosis not present

## 2013-03-23 MED ORDER — BORTEZOMIB CHEMO SQ INJECTION 3.5 MG (2.5MG/ML)
1.3000 mg/m2 | Freq: Once | INTRAMUSCULAR | Status: AC
  Administered 2013-03-23: 2.25 mg via SUBCUTANEOUS
  Filled 2013-03-23: qty 2.25

## 2013-03-23 MED ORDER — ONDANSETRON HCL 8 MG PO TABS
8.0000 mg | ORAL_TABLET | Freq: Once | ORAL | Status: AC
  Administered 2013-03-23: 8 mg via ORAL

## 2013-03-23 NOTE — Patient Instructions (Signed)

## 2013-03-29 ENCOUNTER — Other Ambulatory Visit: Payer: Self-pay | Admitting: *Deleted

## 2013-03-29 ENCOUNTER — Encounter: Payer: Self-pay | Admitting: Internal Medicine

## 2013-03-29 DIAGNOSIS — C50919 Malignant neoplasm of unspecified site of unspecified female breast: Secondary | ICD-10-CM

## 2013-03-29 DIAGNOSIS — C9000 Multiple myeloma not having achieved remission: Secondary | ICD-10-CM

## 2013-03-30 ENCOUNTER — Ambulatory Visit (HOSPITAL_BASED_OUTPATIENT_CLINIC_OR_DEPARTMENT_OTHER): Payer: Medicare Other

## 2013-03-30 ENCOUNTER — Other Ambulatory Visit: Payer: Self-pay | Admitting: Oncology

## 2013-03-30 ENCOUNTER — Other Ambulatory Visit (HOSPITAL_BASED_OUTPATIENT_CLINIC_OR_DEPARTMENT_OTHER): Payer: Medicare Other | Admitting: Lab

## 2013-03-30 VITALS — BP 133/71 | HR 95 | Temp 98.5°F | Resp 20

## 2013-03-30 DIAGNOSIS — Z5112 Encounter for antineoplastic immunotherapy: Secondary | ICD-10-CM | POA: Diagnosis not present

## 2013-03-30 DIAGNOSIS — C773 Secondary and unspecified malignant neoplasm of axilla and upper limb lymph nodes: Secondary | ICD-10-CM | POA: Diagnosis not present

## 2013-03-30 DIAGNOSIS — C50919 Malignant neoplasm of unspecified site of unspecified female breast: Secondary | ICD-10-CM | POA: Diagnosis not present

## 2013-03-30 DIAGNOSIS — C9000 Multiple myeloma not having achieved remission: Secondary | ICD-10-CM

## 2013-03-30 DIAGNOSIS — N63 Unspecified lump in unspecified breast: Secondary | ICD-10-CM

## 2013-03-30 LAB — CBC WITH DIFFERENTIAL (CANCER CENTER ONLY)
BASO#: 0.1 10*3/uL (ref 0.0–0.2)
Eosinophils Absolute: 0 10*3/uL (ref 0.0–0.5)
HGB: 11 g/dL — ABNORMAL LOW (ref 11.6–15.9)
MONO#: 0.7 10*3/uL (ref 0.1–0.9)
NEUT#: 6 10*3/uL (ref 1.5–6.5)
Platelets: 256 10*3/uL (ref 145–400)
RBC: 3.43 10*6/uL — ABNORMAL LOW (ref 3.70–5.32)
WBC: 7.5 10*3/uL (ref 3.9–10.0)

## 2013-03-30 LAB — BASIC METABOLIC PANEL
BUN: 20 mg/dL (ref 6–23)
Creatinine, Ser: 1.25 mg/dL — ABNORMAL HIGH (ref 0.50–1.10)
Potassium: 3.9 mEq/L (ref 3.5–5.3)

## 2013-03-30 MED ORDER — HYDROMORPHONE HCL 2 MG PO TABS: 2.0000 mg | ORAL_TABLET | Freq: Four times a day (QID) | ORAL | Status: DC | PRN

## 2013-03-30 MED ORDER — BORTEZOMIB CHEMO SQ INJECTION 3.5 MG (2.5MG/ML)
1.3000 mg/m2 | Freq: Once | INTRAMUSCULAR | Status: AC
  Administered 2013-03-30: 2.25 mg via SUBCUTANEOUS
  Filled 2013-03-30: qty 2.25

## 2013-03-30 MED ORDER — ONDANSETRON HCL 8 MG PO TABS
8.0000 mg | ORAL_TABLET | Freq: Once | ORAL | Status: AC
  Administered 2013-03-30: 8 mg via ORAL

## 2013-03-30 NOTE — Patient Instructions (Signed)

## 2013-04-05 ENCOUNTER — Other Ambulatory Visit: Payer: Self-pay | Admitting: *Deleted

## 2013-04-05 DIAGNOSIS — C9000 Multiple myeloma not having achieved remission: Secondary | ICD-10-CM

## 2013-04-05 DIAGNOSIS — C50919 Malignant neoplasm of unspecified site of unspecified female breast: Secondary | ICD-10-CM

## 2013-04-06 ENCOUNTER — Ambulatory Visit (HOSPITAL_BASED_OUTPATIENT_CLINIC_OR_DEPARTMENT_OTHER): Payer: Medicare Other

## 2013-04-06 ENCOUNTER — Other Ambulatory Visit: Payer: Self-pay | Admitting: Pharmacist

## 2013-04-06 ENCOUNTER — Ambulatory Visit (HOSPITAL_BASED_OUTPATIENT_CLINIC_OR_DEPARTMENT_OTHER): Payer: Medicare Other | Admitting: Hematology & Oncology

## 2013-04-06 ENCOUNTER — Other Ambulatory Visit (HOSPITAL_BASED_OUTPATIENT_CLINIC_OR_DEPARTMENT_OTHER): Payer: Medicare Other | Admitting: Lab

## 2013-04-06 VITALS — BP 142/81 | HR 91 | Temp 98.0°F | Resp 16 | Ht 65.0 in | Wt 141.0 lb

## 2013-04-06 DIAGNOSIS — C9000 Multiple myeloma not having achieved remission: Secondary | ICD-10-CM | POA: Diagnosis not present

## 2013-04-06 DIAGNOSIS — C773 Secondary and unspecified malignant neoplasm of axilla and upper limb lymph nodes: Secondary | ICD-10-CM | POA: Diagnosis not present

## 2013-04-06 DIAGNOSIS — C50919 Malignant neoplasm of unspecified site of unspecified female breast: Secondary | ICD-10-CM

## 2013-04-06 DIAGNOSIS — C50911 Malignant neoplasm of unspecified site of right female breast: Secondary | ICD-10-CM

## 2013-04-06 DIAGNOSIS — Z5112 Encounter for antineoplastic immunotherapy: Secondary | ICD-10-CM | POA: Diagnosis not present

## 2013-04-06 LAB — CBC WITH DIFFERENTIAL (CANCER CENTER ONLY)
BASO%: 1.1 % (ref 0.0–2.0)
EOS%: 0.5 % (ref 0.0–7.0)
HCT: 34.6 % — ABNORMAL LOW (ref 34.8–46.6)
HGB: 11.3 g/dL — ABNORMAL LOW (ref 11.6–15.9)
LYMPH#: 1.1 10*3/uL (ref 0.9–3.3)
MCV: 98 fL (ref 81–101)
MONO%: 12.8 % (ref 0.0–13.0)
RBC: 3.54 10*6/uL — ABNORMAL LOW (ref 3.70–5.32)
WBC: 8.3 10*3/uL (ref 3.9–10.0)

## 2013-04-06 MED ORDER — DIPHENHYDRAMINE HCL 25 MG PO CAPS
50.0000 mg | ORAL_CAPSULE | Freq: Once | ORAL | Status: AC
  Administered 2013-04-06: 50 mg via ORAL

## 2013-04-06 MED ORDER — SODIUM CHLORIDE 0.9 % IV SOLN
Freq: Once | INTRAVENOUS | Status: AC
  Administered 2013-04-06: 10:00:00 via INTRAVENOUS

## 2013-04-06 MED ORDER — SODIUM CHLORIDE 0.9 % IV SOLN
420.0000 mg | Freq: Once | INTRAVENOUS | Status: AC
  Administered 2013-04-06: 420 mg via INTRAVENOUS
  Filled 2013-04-06: qty 14

## 2013-04-06 MED ORDER — TRASTUZUMAB CHEMO INJECTION 440 MG
6.0000 mg/kg | Freq: Once | INTRAVENOUS | Status: AC
  Administered 2013-04-06: 420 mg via INTRAVENOUS
  Filled 2013-04-06: qty 20

## 2013-04-06 MED ORDER — ACETAMINOPHEN 325 MG PO TABS
650.0000 mg | ORAL_TABLET | Freq: Once | ORAL | Status: AC
  Administered 2013-04-06: 650 mg via ORAL

## 2013-04-06 NOTE — Patient Instructions (Addendum)
Lenalidomide Oral Capsules What is this medicine? LENALIDOMIDE (len a LID oh mide) is used to treat anemia from a myelodysplastic syndrome. It is also used to treat multiple myeloma. The medicine may help you to need blood transfusions less often. This medicine may be used for other purposes; ask your health care provider or pharmacist if you have questions. What should I tell my health care provider before I take this medicine? They need to know if you have any of these conditions: -blood clots in the legs or the lungs -infection -irregular monthly periods or menstrual cycles -kidney disease -an unusual or allergic reaction to lenalidomide, other medicines, foods, dyes, or preservatives -pregnant or trying to get pregnant -breast-feeding How should I use this medicine? Take this medicine by mouth with a glass of water. Follow the directions on the prescription label. Do not cut, crush, or chew this medicine. Take your medicine at regular intervals. Do not take it more often than directed. Do not stop taking except on your doctor's advice. A MedGuide will be given with each prescription and refill. Read this guide carefully each time. The MedGuide may change frequently. Talk to your pediatrician regarding the use of this medicine in children. Special care may be needed. Overdosage: If you think you have taken too much of this medicine contact a poison control center or emergency room at once. NOTE: This medicine is only for you. Do not share this medicine with others. What if I miss a dose? If you miss a dose, take it as soon as you can. If your next dose is to be taken in less than 12 hours, then do not take the missed dose. Take the next dose at your regular time. Do not take double or extra doses. What may interact with this medicine? -vaccines This list may not describe all possible interactions. Give your health care provider a list of all the medicines, herbs, non-prescription drugs, or  dietary supplements you use. Also tell them if you smoke, drink alcohol, or use illegal drugs. Some items may interact with your medicine. What should I watch for while using this medicine? Visit your doctor for regular check ups. Tell your doctor or healthcare professional if your symptoms do not start to get better or if they get worse. You will need to have important blood work done while you are taking this medicine. This medicine is available only through a special program. Doctors, pharmacies, and patients must meet all of the conditions of the program. Your health care provider will help you get signed up with the program if you need this medicine. Through the program you will only receive up to a 28 day supply of the medicine at one time. You will need a new prescription for each refill. This medicine can cause birth defects. Do not get pregnant while taking this drug. Females with child-bearing potential will need to have 2 negative pregnancy tests before starting this medicine. Pregnancy testing must be done every 2 to 4 weeks as directed while taking this medicine. Use 2 reliable forms of birth control together while you are taking this medicine and for 1 month after you stop taking this medicine. If you think that you might be pregnant talk to your doctor right away. Men must use a latex condom during sexual contact with a woman while taking this medicine and for 28 days after you stop taking this medicine. A latex condom is needed even if you have had a vasectomy. Contact your doctor  right away if your partner becomes pregnant. Do not donate sperm while taking this medicine and for 28 days after you stop taking this medicine. Do not give blood while taking the medicine and for 1 month after completion of treatment to avoid exposing pregnant women to the medicine through the donated blood. Talk to your doctor about your risk of cancer. You may be more at risk for certain types of cancers if you  take this medicine. You may need blood work done while you are taking this medicine. What side effects may I notice from receiving this medicine? Side effects that you should report to your doctor or health care professional as soon as possible: -allergic reactions like skin rash, itching or hives, swelling of the face, lips, or tongue -bloody or dark, tarry stools -breathing problems -chest pain -dark urine -fever, infection, runny nose, or sore throat -pain in the legs -swelling of your hands, ankles or leg -unusual bleeding or bruising Side effects that usually do not require medical attention (report to your doctor or health care professional if they continue or are bothersome): -diarrhea -tiredness This list may not describe all possible side effects. Call your doctor for medical advice about side effects. You may report side effects to FDA at 1-800-FDA-1088. Where should I keep my medicine? Keep out of the reach of children. Store at room temperature between 15 and 30 degrees C (59 and 86 degrees F). Throw away any unused medicine after the expiration date. NOTE: This sheet is a summary. It may not cover all possible information. If you have questions about this medicine, talk to your doctor, pharmacist, or health care provider.  2013, [employer]. (12/03/2011 1:28:34 PM) Pertuzumab injection What is this medicine? PERTUZUMAB is a monoclonal antibody that targets a protein called HER2. HER2 is found in some breast cancers. This medicine can stop cancer cell growth. This medicine is used with other cancer treatments. This medicine may be used for other purposes; ask your health care provider or pharmacist if you have questions. What should I tell my health care provider before I take this medicine? They need to know if you have any of these conditions: -heart disease -heart failure -high blood pressure -history of irregular heart beat -recent or ongoing radiation  therapy -an unusual or allergic reaction to pertuzumab, other medicines, foods, dyes, or preservatives -pregnant or trying to get pregnant -breast-feeding How should I use this medicine? This medicine is for infusion into a vein. It is given by a health care professional in a hospital or clinic setting. Talk to your pediatrician regarding the use of this medicine in children. Special care may be needed. Overdosage: If you think you've taken too much of this medicine contact a poison control center or emergency room at once. Overdosage: If you think you have taken too much of this medicine contact a poison control center or emergency room at once. NOTE: This medicine is only for you. Do not share this medicine with others. What if I miss a dose? It is important not to miss your dose. Call your doctor or health care professional if you are unable to keep an appointment. What may interact with this medicine? Interactions are not expected. Give your health care provider a list of all the medicines, herbs, non-prescription drugs, or dietary supplements you use. Also tell them if you smoke, drink alcohol, or use illegal drugs. Some items may interact with your medicine. This list may not describe all possible interactions. Give your  health care provider a list of all the medicines, herbs, non-prescription drugs, or dietary supplements you use. Also tell them if you smoke, drink alcohol, or use illegal drugs. Some items may interact with your medicine. What should I watch for while using this medicine? Your condition will be monitored carefully while you are receiving this medicine. Report any side effects. Continue your course of treatment even though you feel ill unless your doctor tells you to stop. Do not become pregnant while taking this medicine. Women should inform their doctor if they wish to become pregnant or think they might be pregnant. There is a potential for serious side effects to an  unborn child. Talk to your health care professional or pharmacist for more information. Do not breast-feed an infant while taking this medicine. Call your doctor or health care professional for advice if you get a fever, chills or sore throat, or other symptoms of a cold or flu. Do not treat yourself. Try to avoid being around people who are sick. You may experience fever, chills, and headache during the infusion. Report any side effects during the infusion to your health care professional. What side effects may I notice from receiving this medicine? Side effects that you should report to your doctor or health care professional as soon as possible: -breathing problems -chest pain or palpitations -dizziness -feeling faint or lightheaded -fever or chills -skin rash, itching or hives -sore throat -swelling of the face, lips, or tongue -swelling of the legs or ankles -unusually weak or tired  Side effects that usually do not require medical attention (Report these to your doctor or health care professional if they continue or are bothersome.): -diarrhea -hair loss -nausea, vomiting -tiredness This list may not describe all possible side effects. Call your doctor for medical advice about side effects. You may report side effects to FDA at 1-800-FDA-1088. Where should I keep my medicine? This drug is given in a hospital or clinic and will not be stored at home. NOTE: This sheet is a summary. It may not cover all possible information. If you have questions about this medicine, talk to your doctor, pharmacist, or health care provider.  2013, [employer] Standard. (04/01/2011 4:45:56 PM) Trastuzumab injection for infusion What is this medicine? TRASTUZUMAB (tras TOO zoo mab) is a monoclonal antibody. It targets a protein called HER2. This protein is found in some stomach and breast cancers. This medicine can stop cancer cell growth. This medicine may be used with other cancer treatments. This  medicine may be used for other purposes; ask your health care provider or pharmacist if you have questions. What should I tell my health care provider before I take this medicine? They need to know if you have any of these conditions: -heart disease -heart failure -infection (especially a virus infection such as chickenpox, cold sores, or herpes) -lung or breathing disease, like asthma -recent or ongoing radiation therapy -an unusual or allergic reaction to trastuzumab, benzyl alcohol, or other medications, foods, dyes, or preservatives -pregnant or trying to get pregnant -breast-feeding How should I use this medicine? This drug is given as an infusion into a vein. It is administered in a hospital or clinic by a specially trained health care professional. Talk to your pediatrician regarding the use of this medicine in children. This medicine is not approved for use in children. Overdosage: If you think you have taken too much of this medicine contact a poison control center or emergency room at once. NOTE: This medicine is only  for you. Do not share this medicine with others. What if I miss a dose? It is important not to miss a dose. Call your doctor or health care professional if you are unable to keep an appointment. What may interact with this medicine? -cyclophosphamide -doxorubicin -warfarin This list may not describe all possible interactions. Give your health care provider a list of all the medicines, herbs, non-prescription drugs, or dietary supplements you use. Also tell them if you smoke, drink alcohol, or use illegal drugs. Some items may interact with your medicine. What should I watch for while using this medicine? Visit your doctor for checks on your progress. Report any side effects. Continue your course of treatment even though you feel ill unless your doctor tells you to stop. Call your doctor or health care professional for advice if you get a fever, chills or sore throat,  or other symptoms of a cold or flu. Do not treat yourself. Try to avoid being around people who are sick. You may experience fever, chills and shaking during your first infusion. These effects are usually mild and can be treated with other medicines. Report any side effects during the infusion to your health care professional. Fever and chills usually do not happen with later infusions. What side effects may I notice from receiving this medicine? Side effects that you should report to your doctor or other health care professional as soon as possible: -breathing difficulties -chest pain or palpitations -cough -dizziness or fainting -fever or chills, sore throat -skin rash, itching or hives -swelling of the legs or ankles -unusually weak or tired Side effects that usually do not require medical attention (report to your doctor or other health care professional if they continue or are bothersome): -loss of appetite -headache -muscle aches -nausea This list may not describe all possible side effects. Call your doctor for medical advice about side effects. You may report side effects to FDA at 1-800-FDA-1088. Where should I keep my medicine? This drug is given in a hospital or clinic and will not be stored at home. NOTE: This sheet is a summary. It may not cover all possible information. If you have questions about this medicine, talk to your doctor, pharmacist, or health care provider.  2013, [employer] Standard. (08/09/2009 1:43:15 PM)

## 2013-04-06 NOTE — Progress Notes (Signed)
This office note has been dictated.

## 2013-04-07 NOTE — Progress Notes (Signed)
CC:   Jonita Albee, M.D.  DIAGNOSES: 1. Stage II-B (T2, N1, M0) infiltrating ductal carcinoma of the right     breast (HER2/positive/ER negative). 2. IgA kappa myeloma.  CURRENT THERAPY: 1. Herceptin/Perjeta in the adjuvant setting. 2. Weekly Velcade. 3. Zometa 4 mg IV q. 3-4 weeks. 4. Decadron 40 mg q.month.  INTERIM HISTORY:  Ms. Patteson comes in for follow up.  She is doing okay. She is not having as much back discomfort and appears to be ambulating low better.  She has tolerated treatment quite well.  She has had no issues with Herceptin or Perjeta.  As far as her myeloma is concerned, her last monoclonal studies were up a little bit.  This is a little bit worrisome to me.  Back in late May, her monoclonal spike was up to 2.81 g/dL.  Her IgA level was 31 with 3110 mg/dL.  Her kappa light chain was up to 95 mg/dL.  Then, we were  going to have to get her on Revlimid, in addition to the Velcade.  I think we can probably start her on 15 mg day of Revlimid  (3 weeks On, 1 week off).  I talked to her about this.  I explained to her why we need to do this. She understood this.  She has had no problems with bowels or bladder.  There has not had any kind of leg swelling.  She has had no fevers.  There has been no cough. Her appetite has been okay.  PHYSICAL EXAMINATION:  General:  This is a elderly white female in no obvious distress.  Vital signs:  Temperature of 98, pulse 91, rate 16, blood pressure 142/81.  Weight is 141.  Head and neck:  Normocephalic, atraumatic skull.  There are no ocular or oral lesions.  There are no palpable cervical or supraclavicular lymph nodes.  Lungs:  Clear bilaterally.  Cardiac:  Regular rate and rhythm with a normal S1 and S2. There are no murmurs, rubs or bruits.  Abdomen:  Soft with good bowel sounds.  There is no palpable abdominal mass.  There is no fluid wave. There is no palpable hepatosplenomegaly.  Breasts:  Shows right mastectomy.   This is well healed.  Left breast was unremarkable.  There are no nodules or lumps of the left breast.  There is no bilateral axillary adenopathy.  Back:  Shows some slight kyphosis.  No tenderness is noted over the spine, ribs, or hips.  Extremities:  Show no clubbing, cyanosis or edema.  Neurological:  Shows no focal neurological deficits.  LABORATORY STUDIES:  White cell count is 8.3, hemoglobin 11.3, hematocrit 34.6, platelet count 256.  MCV is 98.  IMPRESSION:  Ms. Karpel is a 79 year old white female with 2 separate issues.  She has stage IIB, ductal carcinoma of the right breast.  She had a mastectomy.  She had 2 positive lymph nodes.  We are treating her with Herceptin/Perjeta.  I just do not think that she has a good performance status to be able to handle aggressive chemotherapy for the breast cancer.  As far as her myeloma is concerned, we will have to add Revlimid. Again, will go 15 mg daily, 2 weeks on and 1 week off.  She will start her next 3 week cycle of Velcade next week.  I will plan to see her back, probably in another few weeks.    ______________________________ Josph Macho, M.D. PRE/MEDQ  D:  04/06/2013  T:  04/07/2013  Job:  [id number]

## 2013-04-11 ENCOUNTER — Other Ambulatory Visit: Payer: Self-pay | Admitting: *Deleted

## 2013-04-11 MED ORDER — LENALIDOMIDE 15 MG PO CAPS: 15.0000 mg | ORAL_CAPSULE | Freq: Every day | ORAL | Status: DC

## 2013-04-13 ENCOUNTER — Other Ambulatory Visit: Payer: Self-pay | Admitting: Medical

## 2013-04-13 ENCOUNTER — Ambulatory Visit (HOSPITAL_BASED_OUTPATIENT_CLINIC_OR_DEPARTMENT_OTHER): Payer: Medicare Other

## 2013-04-13 ENCOUNTER — Other Ambulatory Visit: Payer: Medicare Other | Admitting: Lab

## 2013-04-13 ENCOUNTER — Other Ambulatory Visit (HOSPITAL_BASED_OUTPATIENT_CLINIC_OR_DEPARTMENT_OTHER): Payer: Medicare Other

## 2013-04-13 VITALS — BP 132/67 | HR 84 | Temp 98.1°F | Resp 18

## 2013-04-13 DIAGNOSIS — C773 Secondary and unspecified malignant neoplasm of axilla and upper limb lymph nodes: Secondary | ICD-10-CM | POA: Diagnosis not present

## 2013-04-13 DIAGNOSIS — C9000 Multiple myeloma not having achieved remission: Secondary | ICD-10-CM | POA: Diagnosis not present

## 2013-04-13 DIAGNOSIS — Z5112 Encounter for antineoplastic immunotherapy: Secondary | ICD-10-CM

## 2013-04-13 DIAGNOSIS — C50919 Malignant neoplasm of unspecified site of unspecified female breast: Secondary | ICD-10-CM | POA: Diagnosis not present

## 2013-04-13 LAB — CMP (CANCER CENTER ONLY)
ALT(SGPT): 32 U/L (ref 10–47)
AST: 33 U/L (ref 11–38)
Creat: 2.1 mg/dl — ABNORMAL HIGH (ref 0.6–1.2)
Total Bilirubin: 0.9 mg/dl (ref 0.20–1.60)

## 2013-04-13 LAB — CBC WITH DIFFERENTIAL (CANCER CENTER ONLY)
Eosinophils Absolute: 0 10*3/uL (ref 0.0–0.5)
LYMPH%: 9.6 % — ABNORMAL LOW (ref 14.0–48.0)
MCV: 98 fL (ref 81–101)
MONO#: 0.6 10*3/uL (ref 0.1–0.9)
NEUT#: 6.1 10*3/uL (ref 1.5–6.5)
Platelets: 282 10*3/uL (ref 145–400)
RBC: 3.46 10*6/uL — ABNORMAL LOW (ref 3.70–5.32)
WBC: 7.6 10*3/uL (ref 3.9–10.0)

## 2013-04-13 MED ORDER — ZOLEDRONIC ACID 4 MG/100ML IV SOLN
4.0000 mg | Freq: Once | INTRAVENOUS | Status: AC
  Administered 2013-04-13: 4 mg via INTRAVENOUS
  Filled 2013-04-13: qty 100

## 2013-04-13 MED ORDER — SODIUM CHLORIDE 0.9 % IJ SOLN
3.0000 mL | Freq: Once | INTRAMUSCULAR | Status: DC | PRN
  Filled 2013-04-13: qty 10

## 2013-04-13 MED ORDER — ONDANSETRON HCL 8 MG PO TABS
8.0000 mg | ORAL_TABLET | Freq: Once | ORAL | Status: AC
  Administered 2013-04-13: 8 mg via ORAL

## 2013-04-13 MED ORDER — BORTEZOMIB CHEMO SQ INJECTION 3.5 MG (2.5MG/ML)
1.3000 mg/m2 | Freq: Once | INTRAMUSCULAR | Status: AC
  Administered 2013-04-13: 2.25 mg via SUBCUTANEOUS
  Filled 2013-04-13: qty 2.25

## 2013-04-13 MED ORDER — SODIUM CHLORIDE 0.9 % IV SOLN
Freq: Once | INTRAVENOUS | Status: AC
  Administered 2013-04-13: 12:00:00 via INTRAVENOUS

## 2013-04-13 MED ORDER — SODIUM CHLORIDE 0.9 % IV SOLN
Freq: Once | INTRAVENOUS | Status: AC
  Administered 2013-04-13: 13:00:00 via INTRAVENOUS

## 2013-04-13 MED ORDER — FAMCICLOVIR 250 MG PO TABS: ORAL_TABLET | ORAL | Status: DC

## 2013-04-13 NOTE — Patient Instructions (Addendum)
Famciclovir tablets What is this medicine? FAMCICLOVIR (fam SYE kloe veer) is an antiviral medicine. It is used to treat or prevent infections caused by certain kinds of viruses. Examples of these infections are herpes and shingles. This medicine will not cure herpes. This medicine may be used for other purposes; ask your health care provider or pharmacist if you have questions. What should I tell my health care provider before I take this medicine? They need to know if you have any of these conditions: -kidney disease -an unusual or allergic reaction to famciclovir, penciclovir, other medicines, food, dyes, or preservatives -pregnant or trying to get pregnant -breast-feeding How should I use this medicine? Take this medicine by mouth with a glass of water. Follow the directions on the prescription label. You can take it with or without food. Take your medicine at regular intervals. Do not take your medicine more often than directed. Take all of your medicine as directed even if you think you are better. Do not skip doses or stop your medicine early. Talk to your pediatrician regarding the use of this medicine in children. Special care may be needed. Overdosage: If you think you have taken too much of this medicine contact a poison control center or emergency room at once. NOTE: This medicine is only for you. Do not share this medicine with others. What if I miss a dose? If you miss a dose, take it as soon as you can. If it is almost time for your next dose, take only that dose. Do not take double or extra doses. What may interact with this medicine? -probenecid This list may not describe all possible interactions. Give your health care provider a list of all the medicines, herbs, non-prescription drugs, or dietary supplements you use. Also tell them if you smoke, drink alcohol, or use illegal drugs. Some items may interact with your medicine. What should I watch for while using this  medicine? Tell your doctor or health care professional if your symptoms do not improve. This medicine works best when started very early in the course of an infection. Begin treatment at the first signs of infection. Drink 6 to 8 glasses of water or fluids every day while you are taking this medicine. This will help prevent side effects. You can still pass the shingles or herpes to another person even while you are taking this medicine. Avoid contact with others as directed. Genital herpes is a sexually transmitted disease. Talk to your doctor about how to stop the spread of infection. What side effects may I notice from receiving this medicine? Side effects that you should report to your doctor or health care professional as soon as possible: -allergic reactions like skin rash, itching or hives, swelling of the face, lips, or tongue -confusion, hallucinations Side effects that usually do not require medical attention (report to your doctor or health care professional if they continue or are bothersome): -diarrhea -headache -nausea, vomiting -stomach upset -unusually weak or tired This list may not describe all possible side effects. Call your doctor for medical advice about side effects. You may report side effects to FDA at 1-800-FDA-1088. Where should I keep my medicine? Keep out of the reach of children. Store at room temperature between 15 and 30 degrees C (59 and 86 degrees F). Throw away any unused medicine after the expiration date. NOTE: This sheet is a summary. It may not cover all possible information. If you have questions about this medicine, talk to your doctor, pharmacist, or  health care provider.  2013, [employer]. (12/21/2007 1:52:39 PM)   Zoledronic Acid injection (Hypercalcemia, Oncology) What is this medicine? ZOLEDRONIC ACID (ZOE le dron ik AS id) lowers the amount of calcium loss from bone. It is used to treat too much calcium in your blood from cancer. It is  also used to prevent complications of cancer that has spread to the bone. This medicine may be used for other purposes; ask your health care provider or pharmacist if you have questions. What should I tell my health care provider before I take this medicine? They need to know if you have any of these conditions: -aspirin-sensitive asthma -dental disease -kidney disease -an unusual or allergic reaction to zoledronic acid, other medicines, foods, dyes, or preservatives -pregnant or trying to get pregnant -breast-feeding How should I use this medicine? This medicine is for infusion into a vein. It is given by a health care professional in a hospital or clinic setting. Talk to your pediatrician regarding the use of this medicine in children. Special care may be needed. Overdosage: If you think you have taken too much of this medicine contact a poison control center or emergency room at once. NOTE: This medicine is only for you. Do not share this medicine with others. What if I miss a dose? It is important not to miss your dose. Call your doctor or health care professional if you are unable to keep an appointment. What may interact with this medicine? -certain antibiotics given by injection -NSAIDs, medicines for pain and inflammation, like ibuprofen or naproxen -some diuretics like bumetanide, furosemide -teriparatide -thalidomide This list may not describe all possible interactions. Give your health care provider a list of all the medicines, herbs, non-prescription drugs, or dietary supplements you use. Also tell them if you smoke, drink alcohol, or use illegal drugs. Some items may interact with your medicine. What should I watch for while using this medicine? Visit your doctor or health care professional for regular checkups. It may be some time before you see the benefit from this medicine. Do not stop taking your medicine unless your doctor tells you to. Your doctor may order blood tests or  other tests to see how you are doing. Women should inform their doctor if they wish to become pregnant or think they might be pregnant. There is a potential for serious side effects to an unborn child. Talk to your health care professional or pharmacist for more information. You should make sure that you get enough calcium and vitamin D while you are taking this medicine. Discuss the foods you eat and the vitamins you take with your health care professional. Some people who take this medicine have severe bone, joint, and/or muscle pain. This medicine may also increase your risk for a broken thigh bone. Tell your doctor right away if you have pain in your upper leg or groin. Tell your doctor if you have any pain that does not go away or that gets worse. What side effects may I notice from receiving this medicine? Side effects that you should report to your doctor or health care professional as soon as possible: -allergic reactions like skin rash, itching or hives, swelling of the face, lips, or tongue -anxiety, confusion, or depression -breathing problems -changes in vision -feeling faint or lightheaded, falls -jaw burning, cramping, pain -muscle cramps, stiffness, or weakness -trouble passing urine or change in the amount of urine Side effects that usually do not require medical attention (report to your doctor or health  care professional if they continue or are bothersome): -bone, joint, or muscle pain -fever -hair loss -irritation at site where injected -loss of appetite -nausea, vomiting -stomach upset -tired This list may not describe all possible side effects. Call your doctor for medical advice about side effects. You may report side effects to FDA at 1-800-FDA-1088. Where should I keep my medicine? This drug is given in a hospital or clinic and will not be stored at home. NOTE: This sheet is a summary. It may not cover all possible information. If you have questions about this  medicine, talk to your doctor, pharmacist, or health care provider.  2013, [employer]. (04/03/2011 9:06:58 AM)  Bortezomib injection What is this medicine? BORTEZOMIB (bor TEZ oh mib) is a chemotherapy drug. It slows the growth of cancer cells. This medicine is used to treat multiple myeloma, lymphoma, and other cancers. This medicine may be used for other purposes; ask your health care provider or pharmacist if you have questions. What should I tell my health care provider before I take this medicine? They need to know if you have any of these conditions: -heart disease -irregular heartbeat -liver disease -low blood counts, like low white blood cells, platelets, or hemoglobin -peripheral neuropathy -taking medicine for blood pressure -an unusual or allergic reaction to bortezomib, mannitol, boron, other medicines, foods, dyes, or preservatives -pregnant or trying to get pregnant -breast-feeding How should I use this medicine? This medicine is for injection into a vein or for injection under the skin. It is given by a health care professional in a hospital or clinic setting. Talk to your pediatrician regarding the use of this medicine in children. Special care may be needed. Overdosage: If you think you have taken too much of this medicine contact a poison control center or emergency room at once. NOTE: This medicine is only for you. Do not share this medicine with others. What if I miss a dose? It is important not to miss your dose. Call your doctor or health care professional if you are unable to keep an appointment. What may interact with this medicine? -medicines for diabetes -medicines to increase blood counts like filgrastim, pegfilgrastim, sargramostim -zalcitabine Talk to your doctor or health care professional before taking any of these medicines: -acetaminophen -aspirin -ibuprofen -ketoprofen -naproxen This list may not describe all possible interactions. Give  your health care provider a list of all the medicines, herbs, non-prescription drugs, or dietary supplements you use. Also tell them if you smoke, drink alcohol, or use illegal drugs. Some items may interact with your medicine. What should I watch for while using this medicine? Visit your doctor for checks on your progress. This drug may make you feel generally unwell. This is not uncommon, as chemotherapy can affect healthy cells as well as cancer cells. Report any side effects. Continue your course of treatment even though you feel ill unless your doctor tells you to stop. You may get drowsy or dizzy. Do not drive, use machinery, or do anything that needs mental alertness until you know how this medicine affects you. Do not stand or sit up quickly, especially if you are an older patient. This reduces the risk of dizzy or fainting spells. In some cases, you may be given additional medicines to help with side effects. Follow all directions for their use. Call your doctor or health care professional for advice if you get a fever, chills or sore throat, or other symptoms of a cold or flu. Do not treat yourself.  This drug decreases your body's ability to fight infections. Try to avoid being around people who are sick. This medicine may increase your risk to bruise or bleed. Call your doctor or health care professional if you notice any unusual bleeding. Be careful brushing and flossing your teeth or using a toothpick because you may get an infection or bleed more easily. If you have any dental work done, tell your dentist you are receiving this medicine. Avoid taking products that contain aspirin, acetaminophen, ibuprofen, naproxen, or ketoprofen unless instructed by your doctor. These medicines may hide a fever. Do not become pregnant while taking this medicine. Women should inform their doctor if they wish to become pregnant or think they might be pregnant. There is a potential for serious side effects to an  unborn child. Talk to your health care professional or pharmacist for more information. Do not breast-feed an infant while taking this medicine. You may have vomiting or diarrhea while taking this medicine. Drink water or other fluids as directed. What side effects may I notice from receiving this medicine? Side effects that you should report to your doctor or health care professional as soon as possible: -allergic reactions like skin rash, itching or hives, swelling of the face, lips, or tongue -breathing problems -changes in hearing -changes in vision -fast, irregular heartbeat -feeling faint or lightheaded, falls -pain, tingling, numbness in the hands or feet -seizures -swelling of the ankles, feet, hands -unusual bleeding or bruising -unusually weak or tired -vomiting Side effects that usually do not require medical attention (report to your doctor or health care professional if they continue or are bothersome): -changes in emotions or moods -constipation -diarrhea -loss of appetite -headache -irritation at site where injected -nausea This list may not describe all possible side effects. Call your doctor for medical advice about side effects. You may report side effects to FDA at 1-800-FDA-1088. Where should I keep my medicine? This drug is given in a hospital or clinic and will not be stored at home. NOTE: This sheet is a summary. It may not cover all possible information. If you have questions about this medicine, talk to your doctor, pharmacist, or health care provider.  2013, [employer]. (11/12/2010 11:42:36 AM)

## 2013-04-14 ENCOUNTER — Other Ambulatory Visit: Payer: Self-pay | Admitting: *Deleted

## 2013-04-14 NOTE — Telephone Encounter (Signed)
iincorrect encounter

## 2013-04-17 LAB — PROTEIN ELECTROPHORESIS, SERUM, WITH REFLEX
Beta 2: 35.2 % — ABNORMAL HIGH (ref 3.2–6.5)
Gamma Globulin: 2 % — ABNORMAL LOW (ref 11.1–18.8)
M-Spike, %: 2.71 g/dL

## 2013-04-17 LAB — KAPPA/LAMBDA LIGHT CHAINS
Kappa free light chain: 119 mg/dL — ABNORMAL HIGH (ref 0.33–1.94)
Lambda Free Lght Chn: 0.85 mg/dL (ref 0.57–2.63)

## 2013-04-17 LAB — IGG, IGA, IGM
IgA: 3680 mg/dL — ABNORMAL HIGH (ref 69–380)
IgG (Immunoglobin G), Serum: 203 mg/dL — ABNORMAL LOW (ref 690–1700)

## 2013-04-20 ENCOUNTER — Ambulatory Visit (HOSPITAL_BASED_OUTPATIENT_CLINIC_OR_DEPARTMENT_OTHER): Payer: Medicare Other

## 2013-04-20 ENCOUNTER — Other Ambulatory Visit: Payer: Medicare Other | Admitting: Lab

## 2013-04-20 VITALS — BP 139/89 | HR 84 | Temp 97.8°F | Resp 20

## 2013-04-20 DIAGNOSIS — C9000 Multiple myeloma not having achieved remission: Secondary | ICD-10-CM

## 2013-04-20 DIAGNOSIS — Z5112 Encounter for antineoplastic immunotherapy: Secondary | ICD-10-CM | POA: Diagnosis not present

## 2013-04-20 LAB — BASIC METABOLIC PANEL
CO2: 25 mEq/L (ref 19–32)
Glucose, Bld: 127 mg/dL — ABNORMAL HIGH (ref 70–99)
Potassium: 3.4 mEq/L — ABNORMAL LOW (ref 3.5–5.3)
Sodium: 133 mEq/L — ABNORMAL LOW (ref 135–145)

## 2013-04-20 MED ORDER — ONDANSETRON HCL 8 MG PO TABS
8.0000 mg | ORAL_TABLET | Freq: Once | ORAL | Status: AC
  Administered 2013-04-20: 8 mg via ORAL

## 2013-04-20 MED ORDER — BORTEZOMIB CHEMO SQ INJECTION 3.5 MG (2.5MG/ML)
1.3000 mg/m2 | Freq: Once | INTRAMUSCULAR | Status: AC
  Administered 2013-04-20: 2.25 mg via SUBCUTANEOUS
  Filled 2013-04-20: qty 2.25

## 2013-04-20 NOTE — Progress Notes (Signed)
Pt forgot to check to see if she had Famvir at home. Will check and call us.

## 2013-04-20 NOTE — Patient Instructions (Signed)
Bortezomib injection What is this medicine? BORTEZOMIB (bor TEZ oh mib) is a chemotherapy drug. It slows the growth of cancer cells. This medicine is used to treat multiple myeloma, lymphoma, and other cancers. This medicine may be used for other purposes; ask your health care provider or pharmacist if you have questions. What should I tell my health care provider before I take this medicine? They need to know if you have any of these conditions: -heart disease -irregular heartbeat -liver disease -low blood counts, like low white blood cells, platelets, or hemoglobin -peripheral neuropathy -taking medicine for blood pressure -an unusual or allergic reaction to bortezomib, mannitol, boron, other medicines, foods, dyes, or preservatives -pregnant or trying to get pregnant -breast-feeding How should I use this medicine? This medicine is for injection into a vein or for injection under the skin. It is given by a health care professional in a hospital or clinic setting. Talk to your pediatrician regarding the use of this medicine in children. Special care may be needed. Overdosage: If you think you have taken too much of this medicine contact a poison control center or emergency room at once. NOTE: This medicine is only for you. Do not share this medicine with others. What if I miss a dose? It is important not to miss your dose. Call your doctor or health care professional if you are unable to keep an appointment. What may interact with this medicine? -medicines for diabetes -medicines to increase blood counts like filgrastim, pegfilgrastim, sargramostim -zalcitabine Talk to your doctor or health care professional before taking any of these medicines: -acetaminophen -aspirin -ibuprofen -ketoprofen -naproxen This list may not describe all possible interactions. Give your health care provider a list of all the medicines, herbs, non-prescription drugs, or dietary supplements you use. Also  tell them if you smoke, drink alcohol, or use illegal drugs. Some items may interact with your medicine. What should I watch for while using this medicine? Visit your doctor for checks on your progress. This drug may make you feel generally unwell. This is not uncommon, as chemotherapy can affect healthy cells as well as cancer cells. Report any side effects. Continue your course of treatment even though you feel ill unless your doctor tells you to stop. You may get drowsy or dizzy. Do not drive, use machinery, or do anything that needs mental alertness until you know how this medicine affects you. Do not stand or sit up quickly, especially if you are an older patient. This reduces the risk of dizzy or fainting spells. In some cases, you may be given additional medicines to help with side effects. Follow all directions for their use. Call your doctor or health care professional for advice if you get a fever, chills or sore throat, or other symptoms of a cold or flu. Do not treat yourself. This drug decreases your body's ability to fight infections. Try to avoid being around people who are sick. This medicine may increase your risk to bruise or bleed. Call your doctor or health care professional if you notice any unusual bleeding. Be careful brushing and flossing your teeth or using a toothpick because you may get an infection or bleed more easily. If you have any dental work done, tell your dentist you are receiving this medicine. Avoid taking products that contain aspirin, acetaminophen, ibuprofen, naproxen, or ketoprofen unless instructed by your doctor. These medicines may hide a fever. Do not become pregnant while taking this medicine. Women should inform their doctor if they wish to   become pregnant or think they might be pregnant. There is a potential for serious side effects to an unborn child. Talk to your health care professional or pharmacist for more information. Do not breast-feed an infant while  taking this medicine. You may have vomiting or diarrhea while taking this medicine. Drink water or other fluids as directed. What side effects may I notice from receiving this medicine? Side effects that you should report to your doctor or health care professional as soon as possible: -allergic reactions like skin rash, itching or hives, swelling of the face, lips, or tongue -breathing problems -changes in hearing -changes in vision -fast, irregular heartbeat -feeling faint or lightheaded, falls -pain, tingling, numbness in the hands or feet -seizures -swelling of the ankles, feet, hands -unusual bleeding or bruising -unusually weak or tired -vomiting Side effects that usually do not require medical attention (report to your doctor or health care professional if they continue or are bothersome): -changes in emotions or moods -constipation -diarrhea -loss of appetite -headache -irritation at site where injected -nausea This list may not describe all possible side effects. Call your doctor for medical advice about side effects. You may report side effects to FDA at 1-800-FDA-1088. Where should I keep my medicine? This drug is given in a hospital or clinic and will not be stored at home. NOTE: This sheet is a summary. It may not cover all possible information. If you have questions about this medicine, talk to your doctor, pharmacist, or health care provider.  2013, [employer]. (11/12/2010 11:42:36 AM) Famciclovir tablets What is this medicine? FAMCICLOVIR (fam SYE kloe veer) is an antiviral medicine. It is used to treat or prevent infections caused by certain kinds of viruses. Examples of these infections are herpes and shingles. This medicine will not cure herpes. This medicine may be used for other purposes; ask your health care provider or pharmacist if you have questions. What should I tell my health care provider before I take this medicine? They need to know if you  have any of these conditions: -kidney disease -an unusual or allergic reaction to famciclovir, penciclovir, other medicines, food, dyes, or preservatives -pregnant or trying to get pregnant -breast-feeding How should I use this medicine? Take this medicine by mouth with a glass of water. Follow the directions on the prescription label. You can take it with or without food. Take your medicine at regular intervals. Do not take your medicine more often than directed. Take all of your medicine as directed even if you think you are better. Do not skip doses or stop your medicine early. Talk to your pediatrician regarding the use of this medicine in children. Special care may be needed. Overdosage: If you think you have taken too much of this medicine contact a poison control center or emergency room at once. NOTE: This medicine is only for you. Do not share this medicine with others. What if I miss a dose? If you miss a dose, take it as soon as you can. If it is almost time for your next dose, take only that dose. Do not take double or extra doses. What may interact with this medicine? -probenecid This list may not describe all possible interactions. Give your health care provider a list of all the medicines, herbs, non-prescription drugs, or dietary supplements you use. Also tell them if you smoke, drink alcohol, or use illegal drugs. Some items may interact with your medicine. What should I watch for while using this medicine? Tell your doctor  or health care professional if your symptoms do not improve. This medicine works best when started very early in the course of an infection. Begin treatment at the first signs of infection. Drink 6 to 8 glasses of water or fluids every day while you are taking this medicine. This will help prevent side effects. You can still pass the shingles or herpes to another person even while you are taking this medicine. Avoid contact with others as directed. Genital  herpes is a sexually transmitted disease. Talk to your doctor about how to stop the spread of infection. What side effects may I notice from receiving this medicine? Side effects that you should report to your doctor or health care professional as soon as possible: -allergic reactions like skin rash, itching or hives, swelling of the face, lips, or tongue -confusion, hallucinations Side effects that usually do not require medical attention (report to your doctor or health care professional if they continue or are bothersome): -diarrhea -headache -nausea, vomiting -stomach upset -unusually weak or tired This list may not describe all possible side effects. Call your doctor for medical advice about side effects. You may report side effects to FDA at 1-800-FDA-1088. Where should I keep my medicine? Keep out of the reach of children. Store at room temperature between 15 and 30 degrees C (59 and 86 degrees F). Throw away any unused medicine after the expiration date. NOTE: This sheet is a summary. It may not cover all possible information. If you have questions about this medicine, talk to your doctor, pharmacist, or health care provider.  2013, [employer]. (12/21/2007 1:52:39 PM)

## 2013-04-27 ENCOUNTER — Other Ambulatory Visit: Payer: Medicare Other | Admitting: Lab

## 2013-04-27 ENCOUNTER — Ambulatory Visit: Payer: Medicare Other

## 2013-04-28 ENCOUNTER — Ambulatory Visit (HOSPITAL_BASED_OUTPATIENT_CLINIC_OR_DEPARTMENT_OTHER): Payer: Medicare Other | Admitting: Lab

## 2013-04-28 ENCOUNTER — Ambulatory Visit (HOSPITAL_BASED_OUTPATIENT_CLINIC_OR_DEPARTMENT_OTHER): Payer: Medicare Other

## 2013-04-28 ENCOUNTER — Other Ambulatory Visit: Payer: Self-pay | Admitting: Oncology

## 2013-04-28 ENCOUNTER — Ambulatory Visit: Payer: Medicare Other

## 2013-04-28 VITALS — BP 143/91 | HR 98 | Temp 97.6°F | Resp 20

## 2013-04-28 DIAGNOSIS — C9000 Multiple myeloma not having achieved remission: Secondary | ICD-10-CM

## 2013-04-28 DIAGNOSIS — Z5112 Encounter for antineoplastic immunotherapy: Secondary | ICD-10-CM

## 2013-04-28 DIAGNOSIS — C50919 Malignant neoplasm of unspecified site of unspecified female breast: Secondary | ICD-10-CM

## 2013-04-28 DIAGNOSIS — C773 Secondary and unspecified malignant neoplasm of axilla and upper limb lymph nodes: Secondary | ICD-10-CM | POA: Diagnosis not present

## 2013-04-28 LAB — CBC WITH DIFFERENTIAL (CANCER CENTER ONLY)
BASO%: 0.7 % (ref 0.0–2.0)
EOS%: 0.6 % (ref 0.0–7.0)
HCT: 31.3 % — ABNORMAL LOW (ref 34.8–46.6)
LYMPH#: 1 10*3/uL (ref 0.9–3.3)
MCHC: 32.9 g/dL (ref 32.0–36.0)
MONO#: 1 10*3/uL — ABNORMAL HIGH (ref 0.1–0.9)
NEUT#: 6.8 10*3/uL — ABNORMAL HIGH (ref 1.5–6.5)
Platelets: 244 10*3/uL (ref 145–400)
RDW: 14.9 % (ref 11.1–15.7)
WBC: 8.9 10*3/uL (ref 3.9–10.0)

## 2013-04-28 LAB — BASIC METABOLIC PANEL
CO2: 23 mEq/L (ref 19–32)
Chloride: 97 mEq/L (ref 96–112)
Creatinine, Ser: 1.7 mg/dL — ABNORMAL HIGH (ref 0.50–1.10)
Potassium: 4.4 mEq/L (ref 3.5–5.3)
Sodium: 131 mEq/L — ABNORMAL LOW (ref 135–145)

## 2013-04-28 MED ORDER — SODIUM CHLORIDE 0.9 % IV SOLN
420.0000 mg | Freq: Once | INTRAVENOUS | Status: AC
  Administered 2013-04-28: 420 mg via INTRAVENOUS
  Filled 2013-04-28: qty 14

## 2013-04-28 MED ORDER — BORTEZOMIB CHEMO SQ INJECTION 3.5 MG (2.5MG/ML)
1.3000 mg/m2 | Freq: Once | INTRAMUSCULAR | Status: AC
  Administered 2013-04-28: 2.25 mg via SUBCUTANEOUS
  Filled 2013-04-28: qty 2.25

## 2013-04-28 MED ORDER — TRASTUZUMAB CHEMO INJECTION 440 MG
6.0000 mg/kg | Freq: Once | INTRAVENOUS | Status: AC
  Administered 2013-04-28: 420 mg via INTRAVENOUS
  Filled 2013-04-28: qty 20

## 2013-04-28 MED ORDER — SODIUM CHLORIDE 0.9 % IV SOLN
Freq: Once | INTRAVENOUS | Status: AC
  Administered 2013-04-28: 200 mL via INTRAVENOUS

## 2013-04-28 MED ORDER — FENTANYL 12 MCG/HR TD PT72: 1.0000 | MEDICATED_PATCH | TRANSDERMAL | Status: DC

## 2013-04-28 MED ORDER — DIPHENHYDRAMINE HCL 25 MG PO CAPS
50.0000 mg | ORAL_CAPSULE | Freq: Once | ORAL | Status: AC
  Administered 2013-04-28: 25 mg via ORAL

## 2013-04-28 MED ORDER — ONDANSETRON HCL 8 MG PO TABS
8.0000 mg | ORAL_TABLET | Freq: Once | ORAL | Status: AC
  Administered 2013-04-28: 8 mg via ORAL

## 2013-04-28 MED ORDER — ACETAMINOPHEN 325 MG PO TABS
650.0000 mg | ORAL_TABLET | Freq: Once | ORAL | Status: AC
  Administered 2013-04-28: 325 mg via ORAL

## 2013-04-28 NOTE — Patient Instructions (Signed)
Bortezomib injection What is this medicine? BORTEZOMIB (bor TEZ oh mib) is a chemotherapy drug. It slows the growth of cancer cells. This medicine is used to treat multiple myeloma, lymphoma, and other cancers. This medicine may be used for other purposes; ask your health care provider or pharmacist if you have questions. What should I tell my health care provider before I take this medicine? They need to know if you have any of these conditions: -heart disease -irregular heartbeat -liver disease -low blood counts, like low white blood cells, platelets, or hemoglobin -peripheral neuropathy -taking medicine for blood pressure -an unusual or allergic reaction to bortezomib, mannitol, boron, other medicines, foods, dyes, or preservatives -pregnant or trying to get pregnant -breast-feeding How should I use this medicine? This medicine is for injection into a vein or for injection under the skin. It is given by a health care professional in a hospital or clinic setting. Talk to your pediatrician regarding the use of this medicine in children. Special care may be needed. Overdosage: If you think you have taken too much of this medicine contact a poison control center or emergency room at once. NOTE: This medicine is only for you. Do not share this medicine with others. What if I miss a dose? It is important not to miss your dose. Call your doctor or health care professional if you are unable to keep an appointment. What may interact with this medicine? -medicines for diabetes -medicines to increase blood counts like filgrastim, pegfilgrastim, sargramostim -zalcitabine Talk to your doctor or health care professional before taking any of these medicines: -acetaminophen -aspirin -ibuprofen -ketoprofen -naproxen This list may not describe all possible interactions. Give your health care provider a list of all the medicines, herbs, non-prescription drugs, or dietary supplements you use. Also  tell them if you smoke, drink alcohol, or use illegal drugs. Some items may interact with your medicine. What should I watch for while using this medicine? Visit your doctor for checks on your progress. This drug may make you feel generally unwell. This is not uncommon, as chemotherapy can affect healthy cells as well as cancer cells. Report any side effects. Continue your course of treatment even though you feel ill unless your doctor tells you to stop. You may get drowsy or dizzy. Do not drive, use machinery, or do anything that needs mental alertness until you know how this medicine affects you. Do not stand or sit up quickly, especially if you are an older patient. This reduces the risk of dizzy or fainting spells. In some cases, you may be given additional medicines to help with side effects. Follow all directions for their use. Call your doctor or health care professional for advice if you get a fever, chills or sore throat, or other symptoms of a cold or flu. Do not treat yourself. This drug decreases your body's ability to fight infections. Try to avoid being around people who are sick. This medicine may increase your risk to bruise or bleed. Call your doctor or health care professional if you notice any unusual bleeding. Be careful brushing and flossing your teeth or using a toothpick because you may get an infection or bleed more easily. If you have any dental work done, tell your dentist you are receiving this medicine. Avoid taking products that contain aspirin, acetaminophen, ibuprofen, naproxen, or ketoprofen unless instructed by your doctor. These medicines may hide a fever. Do not become pregnant while taking this medicine. Women should inform their doctor if they wish to   become pregnant or think they might be pregnant. There is a potential for serious side effects to an unborn child. Talk to your health care professional or pharmacist for more information. Do not breast-feed an infant while  taking this medicine. You may have vomiting or diarrhea while taking this medicine. Drink water or other fluids as directed. What side effects may I notice from receiving this medicine? Side effects that you should report to your doctor or health care professional as soon as possible: -allergic reactions like skin rash, itching or hives, swelling of the face, lips, or tongue -breathing problems -changes in hearing -changes in vision -fast, irregular heartbeat -feeling faint or lightheaded, falls -pain, tingling, numbness in the hands or feet -seizures -swelling of the ankles, feet, hands -unusual bleeding or bruising -unusually weak or tired -vomiting Side effects that usually do not require medical attention (report to your doctor or health care professional if they continue or are bothersome): -changes in emotions or moods -constipation -diarrhea -loss of appetite -headache -irritation at site where injected -nausea This list may not describe all possible side effects. Call your doctor for medical advice about side effects. You may report side effects to FDA at 1-800-FDA-1088. Where should I keep my medicine? This drug is given in a hospital or clinic and will not be stored at home. NOTE: This sheet is a summary. It may not cover all possible information. If you have questions about this medicine, talk to your doctor, pharmacist, or health care provider.  2013, [employer] Standard. (11/12/2010 11:42:36 AM) Pertuzumab injection What is this medicine? PERTUZUMAB is a monoclonal antibody that targets a protein called HER2. HER2 is found in some breast cancers. This medicine can stop cancer cell growth. This medicine is used with other cancer treatments. This medicine may be used for other purposes; ask your health care provider or pharmacist if you have questions. What should I tell my health care provider before I take this medicine? They need to know if you have any of these  conditions: -heart disease -heart failure -high blood pressure -history of irregular heart beat -recent or ongoing radiation therapy -an unusual or allergic reaction to pertuzumab, other medicines, foods, dyes, or preservatives -pregnant or trying to get pregnant -breast-feeding How should I use this medicine? This medicine is for infusion into a vein. It is given by a health care professional in a hospital or clinic setting. Talk to your pediatrician regarding the use of this medicine in children. Special care may be needed. Overdosage: If you think you've taken too much of this medicine contact a poison control center or emergency room at once. Overdosage: If you think you have taken too much of this medicine contact a poison control center or emergency room at once. NOTE: This medicine is only for you. Do not share this medicine with others. What if I miss a dose? It is important not to miss your dose. Call your doctor or health care professional if you are unable to keep an appointment. What may interact with this medicine? Interactions are not expected. Give your health care provider a list of all the medicines, herbs, non-prescription drugs, or dietary supplements you use. Also tell them if you smoke, drink alcohol, or use illegal drugs. Some items may interact with your medicine. This list may not describe all possible interactions. Give your health care provider a list of all the medicines, herbs, non-prescription drugs, or dietary supplements you use. Also tell them if you smoke, drink alcohol, or  use illegal drugs. Some items may interact with your medicine. What should I watch for while using this medicine? Your condition will be monitored carefully while you are receiving this medicine. Report any side effects. Continue your course of treatment even though you feel ill unless your doctor tells you to stop. Do not become pregnant while taking this medicine. Women should inform their  doctor if they wish to become pregnant or think they might be pregnant. There is a potential for serious side effects to an unborn child. Talk to your health care professional or pharmacist for more information. Do not breast-feed an infant while taking this medicine. Call your doctor or health care professional for advice if you get a fever, chills or sore throat, or other symptoms of a cold or flu. Do not treat yourself. Try to avoid being around people who are sick. You may experience fever, chills, and headache during the infusion. Report any side effects during the infusion to your health care professional. What side effects may I notice from receiving this medicine? Side effects that you should report to your doctor or health care professional as soon as possible: -breathing problems -chest pain or palpitations -dizziness -feeling faint or lightheaded -fever or chills -skin rash, itching or hives -sore throat -swelling of the face, lips, or tongue -swelling of the legs or ankles -unusually weak or tired  Side effects that usually do not require medical attention (Report these to your doctor or health care professional if they continue or are bothersome.): -diarrhea -hair loss -nausea, vomiting -tiredness This list may not describe all possible side effects. Call your doctor for medical advice about side effects. You may report side effects to FDA at 1-800-FDA-1088. Where should I keep my medicine? This drug is given in a hospital or clinic and will not be stored at home. NOTE: This sheet is a summary. It may not cover all possible information. If you have questions about this medicine, talk to your doctor, pharmacist, or health care provider.  2013, [employer]/Gold Standard. (04/01/2011 4:45:56 PM) Trastuzumab injection for infusion What is this medicine? TRASTUZUMAB (tras TOO zoo mab) is a monoclonal antibody. It targets a protein called HER2. This protein is found in some stomach and  breast cancers. This medicine can stop cancer cell growth. This medicine may be used with other cancer treatments. This medicine may be used for other purposes; ask your health care provider or pharmacist if you have questions. What should I tell my health care provider before I take this medicine? They need to know if you have any of these conditions: -heart disease -heart failure -infection (especially a virus infection such as chickenpox, cold sores, or herpes) -lung or breathing disease, like asthma -recent or ongoing radiation therapy -an unusual or allergic reaction to trastuzumab, benzyl alcohol, or other medications, foods, dyes, or preservatives -pregnant or trying to get pregnant -breast-feeding How should I use this medicine? This drug is given as an infusion into a vein. It is administered in a hospital or clinic by a specially trained health care professional. Talk to your pediatrician regarding the use of this medicine in children. This medicine is not approved for use in children. Overdosage: If you think you have taken too much of this medicine contact a poison control center or emergency room at once. NOTE: This medicine is only for you. Do not share this medicine with others. What if I miss a dose? It is important not to miss a dose. Call your doctor  or health care professional if you are unable to keep an appointment. What may interact with this medicine? -cyclophosphamide -doxorubicin -warfarin This list may not describe all possible interactions. Give your health care provider a list of all the medicines, herbs, non-prescription drugs, or dietary supplements you use. Also tell them if you smoke, drink alcohol, or use illegal drugs. Some items may interact with your medicine. What should I watch for while using this medicine? Visit your doctor for checks on your progress. Report any side effects. Continue your course of treatment even though you feel ill unless your  doctor tells you to stop. Call your doctor or health care professional for advice if you get a fever, chills or sore throat, or other symptoms of a cold or flu. Do not treat yourself. Try to avoid being around people who are sick. You may experience fever, chills and shaking during your first infusion. These effects are usually mild and can be treated with other medicines. Report any side effects during the infusion to your health care professional. Fever and chills usually do not happen with later infusions. What side effects may I notice from receiving this medicine? Side effects that you should report to your doctor or other health care professional as soon as possible: -breathing difficulties -chest pain or palpitations -cough -dizziness or fainting -fever or chills, sore throat -skin rash, itching or hives -swelling of the legs or ankles -unusually weak or tired Side effects that usually do not require medical attention (report to your doctor or other health care professional if they continue or are bothersome): -loss of appetite -headache -muscle aches -nausea This list may not describe all possible side effects. Call your doctor for medical advice about side effects. You may report side effects to FDA at 1-800-FDA-1088. Where should I keep my medicine? This drug is given in a hospital or clinic and will not be stored at home. NOTE: This sheet is a summary. It may not cover all possible information. If you have questions about this medicine, talk to your doctor, pharmacist, or health care provider.  2013, [employer]vier/Gold Standard. (08/09/2009 1:43:15 PM)

## 2013-05-11 ENCOUNTER — Other Ambulatory Visit: Payer: Medicare Other | Admitting: Lab

## 2013-05-11 ENCOUNTER — Ambulatory Visit (HOSPITAL_BASED_OUTPATIENT_CLINIC_OR_DEPARTMENT_OTHER): Payer: Medicare Other | Admitting: Lab

## 2013-05-11 ENCOUNTER — Ambulatory Visit: Payer: Medicare Other

## 2013-05-11 ENCOUNTER — Encounter: Payer: Self-pay | Admitting: *Deleted

## 2013-05-11 ENCOUNTER — Ambulatory Visit (HOSPITAL_BASED_OUTPATIENT_CLINIC_OR_DEPARTMENT_OTHER): Payer: Medicare Other | Admitting: Hematology & Oncology

## 2013-05-11 ENCOUNTER — Ambulatory Visit (HOSPITAL_BASED_OUTPATIENT_CLINIC_OR_DEPARTMENT_OTHER): Payer: Medicare Other

## 2013-05-11 VITALS — BP 138/81 | HR 94 | Temp 98.2°F | Resp 16 | Ht 65.0 in | Wt 140.0 lb

## 2013-05-11 DIAGNOSIS — Z5112 Encounter for antineoplastic immunotherapy: Secondary | ICD-10-CM

## 2013-05-11 DIAGNOSIS — B369 Superficial mycosis, unspecified: Secondary | ICD-10-CM

## 2013-05-11 DIAGNOSIS — C9000 Multiple myeloma not having achieved remission: Secondary | ICD-10-CM

## 2013-05-11 DIAGNOSIS — B49 Unspecified mycosis: Secondary | ICD-10-CM

## 2013-05-11 DIAGNOSIS — C773 Secondary and unspecified malignant neoplasm of axilla and upper limb lymph nodes: Secondary | ICD-10-CM

## 2013-05-11 DIAGNOSIS — C50919 Malignant neoplasm of unspecified site of unspecified female breast: Secondary | ICD-10-CM | POA: Diagnosis not present

## 2013-05-11 LAB — CBC WITH DIFFERENTIAL (CANCER CENTER ONLY)
BASO#: 0.1 10*3/uL (ref 0.0–0.2)
BASO%: 1.3 % (ref 0.0–2.0)
EOS%: 0.4 % (ref 0.0–7.0)
HGB: 9.9 g/dL — ABNORMAL LOW (ref 11.6–15.9)
LYMPH#: 0.8 10*3/uL — ABNORMAL LOW (ref 0.9–3.3)
MCH: 31.6 pg (ref 26.0–34.0)
MCHC: 32.1 g/dL (ref 32.0–36.0)
MONO%: 8.7 % (ref 0.0–13.0)
NEUT#: 5.3 10*3/uL (ref 1.5–6.5)
RDW: 14.9 % (ref 11.1–15.7)

## 2013-05-11 LAB — CMP (CANCER CENTER ONLY)
ALT(SGPT): 28 U/L (ref 10–47)
AST: 34 U/L (ref 11–38)
Albumin: 3 g/dL — ABNORMAL LOW (ref 3.3–5.5)
Alkaline Phosphatase: 62 U/L (ref 26–84)
BUN, Bld: 18 mg/dL (ref 7–22)
Chloride: 99 mEq/L (ref 98–108)
Creat: 1.3 mg/dl — ABNORMAL HIGH (ref 0.6–1.2)
Potassium: 4 mEq/L (ref 3.3–4.7)

## 2013-05-11 MED ORDER — ONDANSETRON HCL 8 MG PO TABS
8.0000 mg | ORAL_TABLET | Freq: Once | ORAL | Status: AC
  Administered 2013-05-11: 8 mg via ORAL

## 2013-05-11 MED ORDER — ZOLEDRONIC ACID 4 MG/5ML IV CONC: 4.0000 mg | Freq: Once | INTRAVENOUS | Status: DC

## 2013-05-11 MED ORDER — ZOLEDRONIC ACID 4 MG/100ML IV SOLN
4.0000 mg | Freq: Once | INTRAVENOUS | Status: AC
  Administered 2013-05-11: 4 mg via INTRAVENOUS
  Filled 2013-05-11: qty 100

## 2013-05-11 MED ORDER — FLUCONAZOLE IN SODIUM CHLORIDE 200-0.9 MG/100ML-% IV SOLN
200.0000 mg | Freq: Once | INTRAVENOUS | Status: AC
  Administered 2013-05-11: 200 mg via INTRAVENOUS
  Filled 2013-05-11: qty 100

## 2013-05-11 MED ORDER — BORTEZOMIB CHEMO SQ INJECTION 3.5 MG (2.5MG/ML)
1.3000 mg/m2 | Freq: Once | INTRAMUSCULAR | Status: AC
  Administered 2013-05-11: 2.25 mg via SUBCUTANEOUS
  Filled 2013-05-11: qty 2.25

## 2013-05-11 NOTE — Progress Notes (Signed)
Called [employer] customer representative to discuss patient assistance.  Patient does not qualfy for assistance.  They personally spoke to patient while she was here.  Was approached by son who said it didn't matter how much it was, that we need to get started on the medicine.  Called [employer] to get the process started with an authorization number from April 14, 2013.  They will call patient and patient son to let them know what the out of pocket amount would be for Revlimid and then let us know.  Dr. Myna Hidalgo informed of this

## 2013-05-12 NOTE — Progress Notes (Signed)
CC:   Jonita Albee, M.D.  DIAGNOSES: 1. IgA kappa myeloma. 2. Stage IIb (T2, N1, M0) ductal carcinoma of the right breast-HER2     positive/ER negative).  CURRENT THERAPY: 1. Velcade q. week (3 weeks on and 1 week off). 2. Zometa 4 mg IV q. 3-4 weeks. 3. Decadron 40 mg IV q. month. 4. Herceptin/Perjeta q.3 weeks.  INTERIM HISTORY:  Ms. Kuwahara comes in for a followup.  Unfortunately, she is yet to get Revlimid.  We are trying to get this for her.  I think she needs it.  With her last myeloma studies, her monoclonal spike was 2.71.  Her IgA level was 3680 mg/dL.  Kappa light chain was up to 119 mg/dL.  There have been issues with trying to get her on Revlimid.  We are trying to work on this, hard.  She has had no problems with the Velcade.  There have been no issues with the Zometa.  As far as her breast cancer is concerned, this also has not been a problem for her.  She has done well with Herceptin/Perjeta.  She has had some pain.  Her back seems to be doing a little bit better. She has had no problems with bowels or bladder.  There has been no leg swelling.  Overall, her performance status is ECOG 1-2.  PHYSICAL EXAMINATION:  General:  This is an elderly white female in no obvious distress.  Vital Signs:  Temperature of 98.2, pulse 94, respiratory rate 16, blood pressure 138/81, weight is 140.  Head and Neck Exam:  She has a normocephalic, atraumatic skull.  There are no ocular or oral lesions.  There are no palpable cervical or supraclavicular lymph nodes.  Lungs:  Clear bilaterally.  Cardiac: Regular rate and rhythm with a normal S1, S2.  There are no murmurs, rubs or bruits.  Abdomen:  Soft.  She has good bowel sounds.  There is no fluid wave.  There is no palpable hepatosplenomegaly.  Breasts: Shows left breast with no masses, edema or erythema.  There is no left axillary adenopathy.  Right chest wall shows well-healed mastectomy. There are no right chest wall  nodules.  There is no right axillary adenopathy.  Back:  Shows no tenderness over the spine, ribs, or hips. Extremities:  Show no clubbing, cyanosis or edema.  LABORATORY STUDIES:  White cell count is 6.8, hemoglobin 10, hematocrit 31, platelet count 242.  IMPRESSION:  Ms. Boeh is a nice 79 year old white female with 2 separate issues.  We are treating both.  I did not feel like she was a good candidate for systemic chemotherapy, but at least we could give her Herceptin/Perjeta.  Her last echocardiogram was done back in April.  We probably will need to repeat this at some point in a month or so.  Again, we are trying to get the Revlimid for her.  I really believe that this will help with her monoclonal decrease and myeloma.  We will continue to follow her along every month.    ______________________________ Josph Macho, M.D. PRE/MEDQ  D:  05/11/2013  T:  05/12/2013  Job:  [id number]

## 2013-05-15 LAB — PROTEIN ELECTROPHORESIS, SERUM, WITH REFLEX
Beta 2: 37.6 % — ABNORMAL HIGH (ref 3.2–6.5)
Beta Globulin: 5.9 % (ref 4.7–7.2)
Gamma Globulin: 1.9 % — ABNORMAL LOW (ref 11.1–18.8)
M-Spike, %: 2.8 g/dL

## 2013-05-15 LAB — IFE INTERPRETATION

## 2013-05-15 LAB — IGG, IGA, IGM
IgA: 3590 mg/dL — ABNORMAL HIGH (ref 69–380)
IgG (Immunoglobin G), Serum: 202 mg/dL — ABNORMAL LOW (ref 690–1700)
IgM, Serum: 16 mg/dL — ABNORMAL LOW (ref 52–322)

## 2013-05-15 LAB — KAPPA/LAMBDA LIGHT CHAINS
Kappa free light chain: 99.8 mg/dL — ABNORMAL HIGH (ref 0.33–1.94)
Lambda Free Lght Chn: 0.78 mg/dL (ref 0.57–2.63)

## 2013-05-17 ENCOUNTER — Other Ambulatory Visit: Payer: Self-pay | Admitting: Hematology & Oncology

## 2013-05-18 ENCOUNTER — Ambulatory Visit (HOSPITAL_BASED_OUTPATIENT_CLINIC_OR_DEPARTMENT_OTHER): Payer: Medicare Other | Admitting: Lab

## 2013-05-18 ENCOUNTER — Ambulatory Visit (HOSPITAL_BASED_OUTPATIENT_CLINIC_OR_DEPARTMENT_OTHER): Payer: Medicare Other

## 2013-05-18 VITALS — BP 146/86 | HR 95 | Temp 98.4°F | Resp 18

## 2013-05-18 DIAGNOSIS — C9000 Multiple myeloma not having achieved remission: Secondary | ICD-10-CM | POA: Diagnosis not present

## 2013-05-18 DIAGNOSIS — R110 Nausea: Secondary | ICD-10-CM

## 2013-05-18 DIAGNOSIS — R197 Diarrhea, unspecified: Secondary | ICD-10-CM | POA: Diagnosis not present

## 2013-05-18 LAB — CBC WITH DIFFERENTIAL (CANCER CENTER ONLY)
BASO#: 0.1 10*3/uL (ref 0.0–0.2)
Eosinophils Absolute: 0.1 10*3/uL (ref 0.0–0.5)
HCT: 29.6 % — ABNORMAL LOW (ref 34.8–46.6)
HGB: 9.8 g/dL — ABNORMAL LOW (ref 11.6–15.9)
LYMPH%: 14 % (ref 14.0–48.0)
MCH: 32 pg (ref 26.0–34.0)
MCV: 97 fL (ref 81–101)
MONO#: 1 10*3/uL — ABNORMAL HIGH (ref 0.1–0.9)
MONO%: 14.7 % — ABNORMAL HIGH (ref 0.0–13.0)
RBC: 3.06 10*6/uL — ABNORMAL LOW (ref 3.70–5.32)

## 2013-05-18 LAB — BASIC METABOLIC PANEL
Chloride: 97 mEq/L (ref 96–112)
Potassium: 3.4 mEq/L — ABNORMAL LOW (ref 3.5–5.3)
Sodium: 130 mEq/L — ABNORMAL LOW (ref 135–145)

## 2013-05-18 MED ORDER — DEXAMETHASONE SODIUM PHOSPHATE 20 MG/5ML IJ SOLN
20.0000 mg | Freq: Once | INTRAMUSCULAR | Status: AC
  Administered 2013-05-18: 20 mg via INTRAVENOUS
  Filled 2013-05-18: qty 5

## 2013-05-18 MED ORDER — SODIUM CHLORIDE 0.9 % IV SOLN
Freq: Once | INTRAVENOUS | Status: AC
  Administered 2013-05-18: 12:00:00 via INTRAVENOUS

## 2013-05-18 NOTE — Progress Notes (Signed)
Jacqueline Rivas c/o nausea and diarrhea, and fatigue over the weekend. Dr. Myna Hidalgo notified, will hold on Chemo today and give IVF and Decadron.

## 2013-05-18 NOTE — Patient Instructions (Signed)
Diarrhea Diarrhea is watery poop (stool). It can make you feel weak, tired, thirsty, or give you a dry mouth (signs of dehydration). Watery poop is a sign of another problem, most often an infection. It often lasts 2 3 days. It can last longer if it is a sign of something serious. Take care of yourself as told by your doctor. HOME CARE   Drink 1 cup (8 ounces) of fluid each time you have watery poop.  Do not drink the following fluids:  Those that contain simple sugars (fructose, glucose, galactose, lactose, sucrose, maltose).  Sports drinks.  Fruit juices.  Whole milk products.  Sodas.  Drinks with caffeine (coffee, tea, soda) or alcohol.  Oral rehydration solution may be used if the doctor says it is okay. You may make your own solution. Follow this recipe:    teaspoon table salt.   teaspoon baking soda.   teaspoon salt substitute containing potassium chloride.  1 tablespoons sugar.  1 liter (34 ounces) of water.  Avoid the following foods:  High fiber foods, such as raw fruits and vegetables.  Nuts, seeds, and whole grain breads and cereals.   Those that are sweetened with sugar alcohols (xylitol, sorbitol, mannitol).  Try eating the following foods:  Starchy foods, such as rice, toast, pasta, low-sugar cereal, oatmeal, baked potatoes, crackers, and bagels.  Bananas.  Applesauce.  Eat probiotic-rich foods, such as yogurt and milk products that are fermented.  Wash your hands well after each time you have watery poop.  Only take medicine as told by your doctor.  Take a warm bath to help lessen burning or pain from having watery poop. GET HELP RIGHT AWAY IF:   You cannot drink fluids without throwing up (vomiting).  You keep throwing up.  You have blood in your poop, or your poop looks black and tarry.  You do not pee (urinate) in 6 8 hours, or there is only a small amount of very dark pee.  You have belly (abdominal) pain that gets worse or stays in  the same spot (localizes).  You are weak, dizzy, confused, or lightheaded.  You have a very bad headache.  Your watery poop gets worse or does not get better.  You have a fever or lasting symptoms for more than 2 3 days.  You have a fever and your symptoms suddenly get worse. MAKE SURE YOU:   Understand these instructions.  Will watch your condition.  Will get help right away if you are not doing well or get worse. Document Released: 03/23/2008 Document Revised: 06/29/2012 Document Reviewed: 06/12/2012 [facility] Patient Information 2014 [place], [place]. Nausea, Adult Nausea means you feel sick to your stomach or need to throw up (vomit). It may be a sign of a more serious problem. If nausea gets worse, you may throw up. If you throw up a lot, you may lose too much body fluid (dehydration). HOME CARE   Get plenty of rest.  Ask your doctor how to replace body fluid losses (rehydrate).  Eat small amounts of food. Sip liquids more often.  Take all medicines as told by your doctor. GET HELP RIGHT AWAY IF:  You have a fever.  You pass out (faint).  You keep throwing up or have blood in your throw up.  You are very weak, have dry lips or a dry mouth, or you are very thirsty (dehydrated).  You have dark or bloody poop (stool).  You have very bad chest or belly (abdominal) pain.  You  do not get better after 2 days, or you get worse.  You have a headache. MAKE SURE YOU:  Understand these instructions.  Will watch your condition.  Will get help right away if you are not doing well or get worse. Document Released: 09/24/2011 Document Revised: 12/28/2011 Document Reviewed: 09/24/2011 [facility] Patient Information 2014 [place], [place].

## 2013-05-25 ENCOUNTER — Ambulatory Visit (HOSPITAL_BASED_OUTPATIENT_CLINIC_OR_DEPARTMENT_OTHER): Payer: Medicare Other | Admitting: Lab

## 2013-05-25 ENCOUNTER — Other Ambulatory Visit: Payer: Medicare Other | Admitting: Lab

## 2013-05-25 ENCOUNTER — Ambulatory Visit: Payer: Medicare Other

## 2013-05-25 ENCOUNTER — Ambulatory Visit (HOSPITAL_BASED_OUTPATIENT_CLINIC_OR_DEPARTMENT_OTHER): Payer: Medicare Other

## 2013-05-25 VITALS — BP 141/86 | HR 86 | Temp 97.1°F | Resp 16

## 2013-05-25 DIAGNOSIS — C50919 Malignant neoplasm of unspecified site of unspecified female breast: Secondary | ICD-10-CM

## 2013-05-25 DIAGNOSIS — Z5112 Encounter for antineoplastic immunotherapy: Secondary | ICD-10-CM | POA: Diagnosis not present

## 2013-05-25 DIAGNOSIS — C9000 Multiple myeloma not having achieved remission: Secondary | ICD-10-CM | POA: Diagnosis not present

## 2013-05-25 DIAGNOSIS — C773 Secondary and unspecified malignant neoplasm of axilla and upper limb lymph nodes: Secondary | ICD-10-CM | POA: Diagnosis not present

## 2013-05-25 DIAGNOSIS — Z5111 Encounter for antineoplastic chemotherapy: Secondary | ICD-10-CM

## 2013-05-25 DIAGNOSIS — C50911 Malignant neoplasm of unspecified site of right female breast: Secondary | ICD-10-CM

## 2013-05-25 LAB — CBC WITH DIFFERENTIAL (CANCER CENTER ONLY)
BASO#: 0.1 10*3/uL (ref 0.0–0.2)
Eosinophils Absolute: 0 10*3/uL (ref 0.0–0.5)
HCT: 29.3 % — ABNORMAL LOW (ref 34.8–46.6)
HGB: 9.5 g/dL — ABNORMAL LOW (ref 11.6–15.9)
LYMPH%: 9.4 % — ABNORMAL LOW (ref 14.0–48.0)
MCV: 99 fL (ref 81–101)
MONO#: 1 10*3/uL — ABNORMAL HIGH (ref 0.1–0.9)
NEUT%: 78.1 % (ref 39.6–80.0)
RBC: 2.97 10*6/uL — ABNORMAL LOW (ref 3.70–5.32)
WBC: 8.9 10*3/uL (ref 3.9–10.0)

## 2013-05-25 LAB — BASIC METABOLIC PANEL
Chloride: 100 mEq/L (ref 96–112)
Potassium: 3.5 mEq/L (ref 3.5–5.3)
Sodium: 134 mEq/L — ABNORMAL LOW (ref 135–145)

## 2013-05-25 MED ORDER — SODIUM CHLORIDE 0.9 % IV SOLN
420.0000 mg | Freq: Once | INTRAVENOUS | Status: AC
  Administered 2013-05-25: 420 mg via INTRAVENOUS
  Filled 2013-05-25: qty 14

## 2013-05-25 MED ORDER — DIPHENHYDRAMINE HCL 25 MG PO CAPS
50.0000 mg | ORAL_CAPSULE | Freq: Once | ORAL | Status: AC
  Administered 2013-05-25: 25 mg via ORAL

## 2013-05-25 MED ORDER — ACETAMINOPHEN 325 MG PO TABS
650.0000 mg | ORAL_TABLET | Freq: Once | ORAL | Status: AC
  Administered 2013-05-25: 650 mg via ORAL

## 2013-05-25 MED ORDER — TRASTUZUMAB CHEMO INJECTION 440 MG
6.0000 mg/kg | Freq: Once | INTRAVENOUS | Status: AC
  Administered 2013-05-25: 420 mg via INTRAVENOUS
  Filled 2013-05-25: qty 20

## 2013-05-25 MED ORDER — DEXAMETHASONE SODIUM PHOSPHATE 20 MG/5ML IJ SOLN
20.0000 mg | Freq: Once | INTRAMUSCULAR | Status: AC
  Administered 2013-05-25: 20 mg via INTRAVENOUS
  Filled 2013-05-25: qty 5

## 2013-05-25 MED ORDER — SODIUM CHLORIDE 0.9 % IV SOLN
250.0000 mg/m2 | Freq: Once | INTRAVENOUS | Status: AC
  Administered 2013-05-25: 440 mg via INTRAVENOUS
  Filled 2013-05-25: qty 22

## 2013-05-25 MED ORDER — SODIUM CHLORIDE 0.9 % IV SOLN
Freq: Once | INTRAVENOUS | Status: AC
  Administered 2013-05-25: 15:00:00 via INTRAVENOUS

## 2013-05-25 MED ORDER — BORTEZOMIB CHEMO SQ INJECTION 3.5 MG (2.5MG/ML)
1.3000 mg/m2 | Freq: Once | INTRAMUSCULAR | Status: AC
  Administered 2013-05-25: 2.25 mg via SUBCUTANEOUS
  Filled 2013-05-25: qty 2.25

## 2013-05-25 MED ORDER — ONDANSETRON 16 MG/50ML IVPB (CHCC)
16.0000 mg | Freq: Once | INTRAVENOUS | Status: AC
  Administered 2013-05-25: 16 mg via INTRAVENOUS

## 2013-05-25 NOTE — Patient Instructions (Addendum)
[facility] Discharge Instructions for Patients Receiving Chemotherapy  Today you received the following chemotherapy agents Velcade, Herceptin, Perjeta,Cytoxan  To help prevent nausea and vomiting after your treatment, we encourage you to take your nausea medication    If you develop nausea and vomiting that is not controlled by your nausea medication, call the clinic.   BELOW ARE SYMPTOMS THAT SHOULD BE REPORTED IMMEDIATELY:  *FEVER GREATER THAN 100.5 F  *CHILLS WITH OR WITHOUT FEVER  NAUSEA AND VOMITING THAT IS NOT CONTROLLED WITH YOUR NAUSEA MEDICATION  *UNUSUAL SHORTNESS OF BREATH  *UNUSUAL BRUISING OR BLEEDING  TENDERNESS IN MOUTH AND THROAT WITH OR WITHOUT PRESENCE OF ULCERS  *URINARY PROBLEMS  *BOWEL PROBLEMS  UNUSUAL RASH Items with * indicate a potential emergency and should be followed up as soon as possible.  Feel free to call the clinic you have any questions or concerns. The clinic phone number is [phone].

## 2013-06-01 ENCOUNTER — Other Ambulatory Visit: Payer: Self-pay | Admitting: *Deleted

## 2013-06-01 DIAGNOSIS — C9000 Multiple myeloma not having achieved remission: Secondary | ICD-10-CM

## 2013-06-02 ENCOUNTER — Ambulatory Visit (HOSPITAL_BASED_OUTPATIENT_CLINIC_OR_DEPARTMENT_OTHER): Payer: Medicare Other

## 2013-06-02 ENCOUNTER — Other Ambulatory Visit: Payer: Self-pay | Admitting: *Deleted

## 2013-06-02 ENCOUNTER — Other Ambulatory Visit (HOSPITAL_BASED_OUTPATIENT_CLINIC_OR_DEPARTMENT_OTHER): Payer: Medicare Other | Admitting: Lab

## 2013-06-02 VITALS — BP 144/71 | HR 81 | Temp 98.0°F | Resp 18

## 2013-06-02 DIAGNOSIS — C9000 Multiple myeloma not having achieved remission: Secondary | ICD-10-CM

## 2013-06-02 DIAGNOSIS — Z5111 Encounter for antineoplastic chemotherapy: Secondary | ICD-10-CM | POA: Diagnosis not present

## 2013-06-02 DIAGNOSIS — C50911 Malignant neoplasm of unspecified site of right female breast: Secondary | ICD-10-CM

## 2013-06-02 DIAGNOSIS — C50919 Malignant neoplasm of unspecified site of unspecified female breast: Secondary | ICD-10-CM

## 2013-06-02 DIAGNOSIS — Z5112 Encounter for antineoplastic immunotherapy: Secondary | ICD-10-CM | POA: Diagnosis not present

## 2013-06-02 LAB — CMP (CANCER CENTER ONLY)
BUN, Bld: 18 mg/dL (ref 7–22)
CO2: 28 mEq/L (ref 18–33)
Calcium: 9.6 mg/dL (ref 8.0–10.3)
Creat: 1.9 mg/dl — ABNORMAL HIGH (ref 0.6–1.2)
Glucose, Bld: 113 mg/dL (ref 73–118)
Total Bilirubin: 0.8 mg/dl (ref 0.20–1.60)

## 2013-06-02 LAB — CBC WITH DIFFERENTIAL (CANCER CENTER ONLY)
BASO#: 0.1 10*3/uL (ref 0.0–0.2)
BASO%: 1 % (ref 0.0–2.0)
EOS%: 0.3 % (ref 0.0–7.0)
Eosinophils Absolute: 0 10*3/uL (ref 0.0–0.5)
HCT: 28 % — ABNORMAL LOW (ref 34.8–46.6)
HGB: 9.2 g/dL — ABNORMAL LOW (ref 11.6–15.9)
LYMPH#: 0.5 10*3/uL — ABNORMAL LOW (ref 0.9–3.3)
LYMPH%: 7.5 % — ABNORMAL LOW (ref 14.0–48.0)
MCH: 32.5 pg (ref 26.0–34.0)
MCHC: 32.9 g/dL (ref 32.0–36.0)
MCV: 99 fL (ref 81–101)
MONO#: 0.7 10*3/uL (ref 0.1–0.9)
MONO%: 10.3 % (ref 0.0–13.0)
NEUT#: 5.7 10*3/uL (ref 1.5–6.5)
NEUT%: 80.9 % — ABNORMAL HIGH (ref 39.6–80.0)
Platelets: 208 10*3/uL (ref 145–400)
RBC: 2.83 10*6/uL — ABNORMAL LOW (ref 3.70–5.32)
RDW: 15.1 % (ref 11.1–15.7)
WBC: 7 10*3/uL (ref 3.9–10.0)

## 2013-06-02 IMAGING — CR DG CHEST 2V
2 series · 2 of 2 positions shown · non-contrast
Comparison: 11/11/2012

CLINICAL DATA: History of hypertension, right mastectomy.  Preop.

CHEST - 2 VIEW

[view not recorded (1 of 2)]
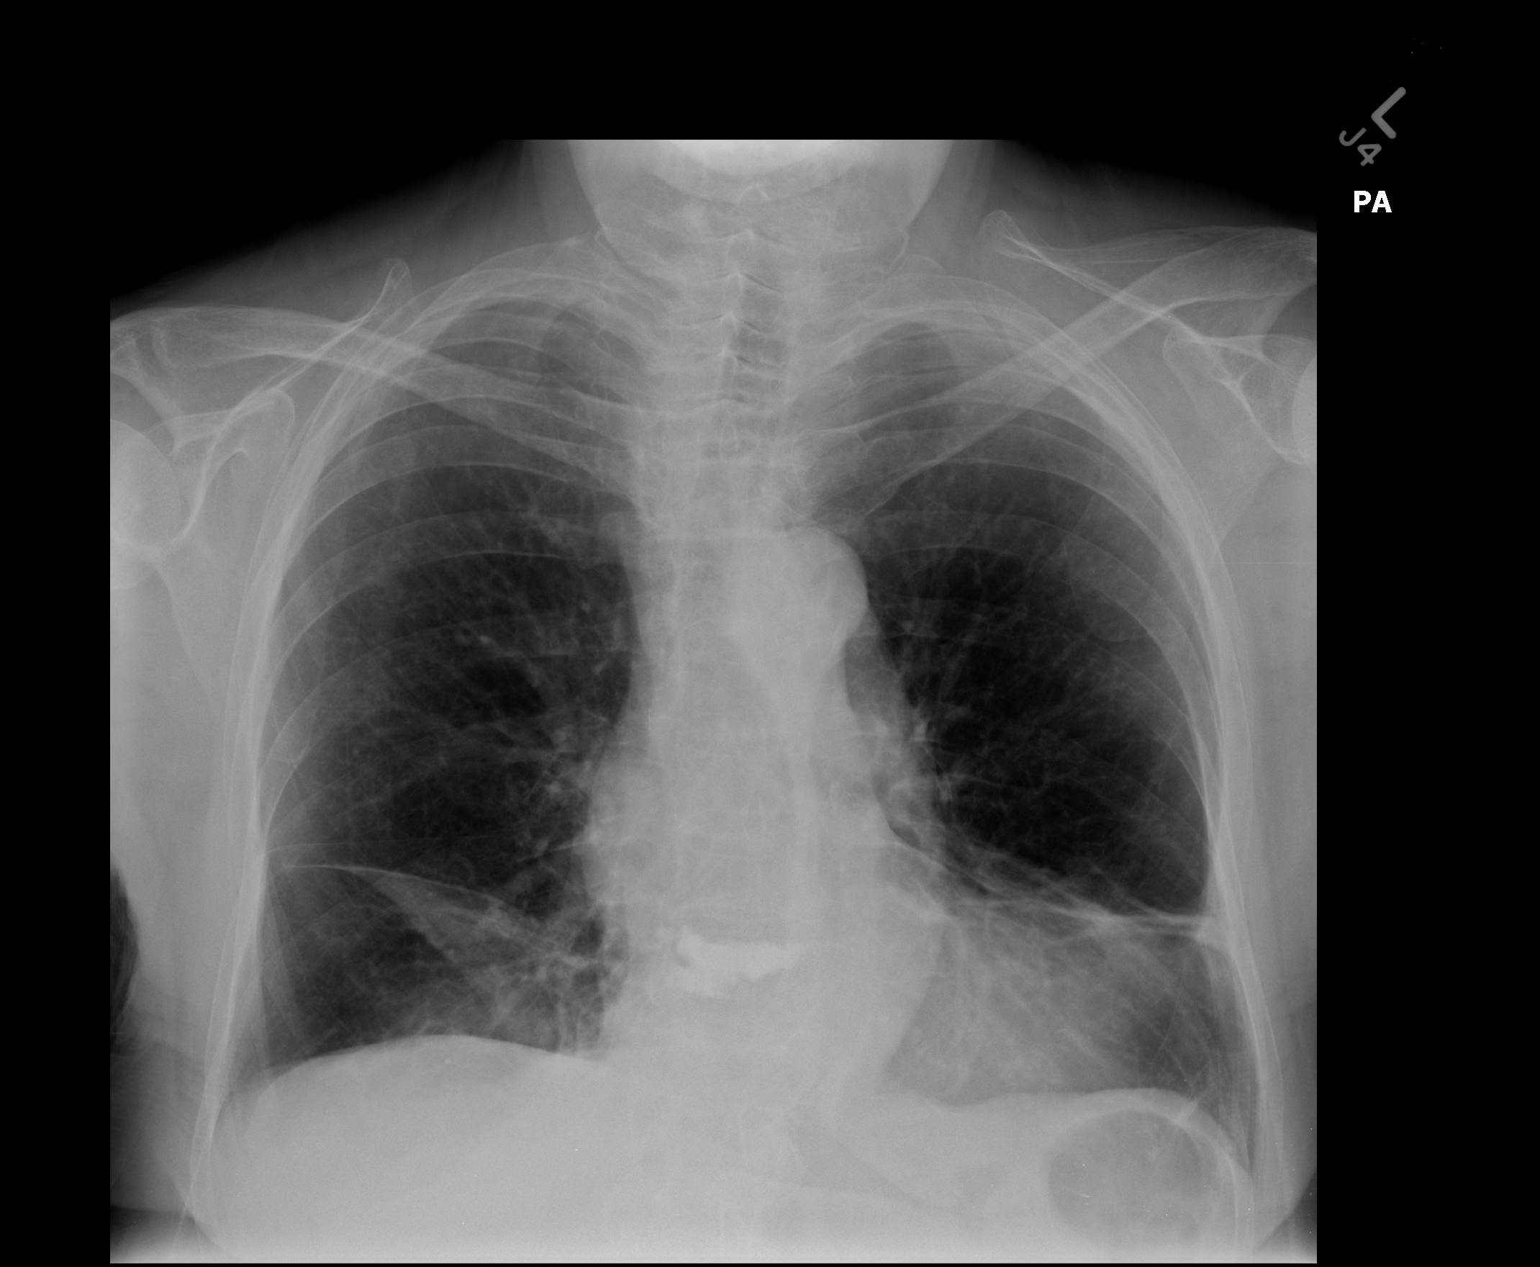

[view not recorded (2 of 2)]
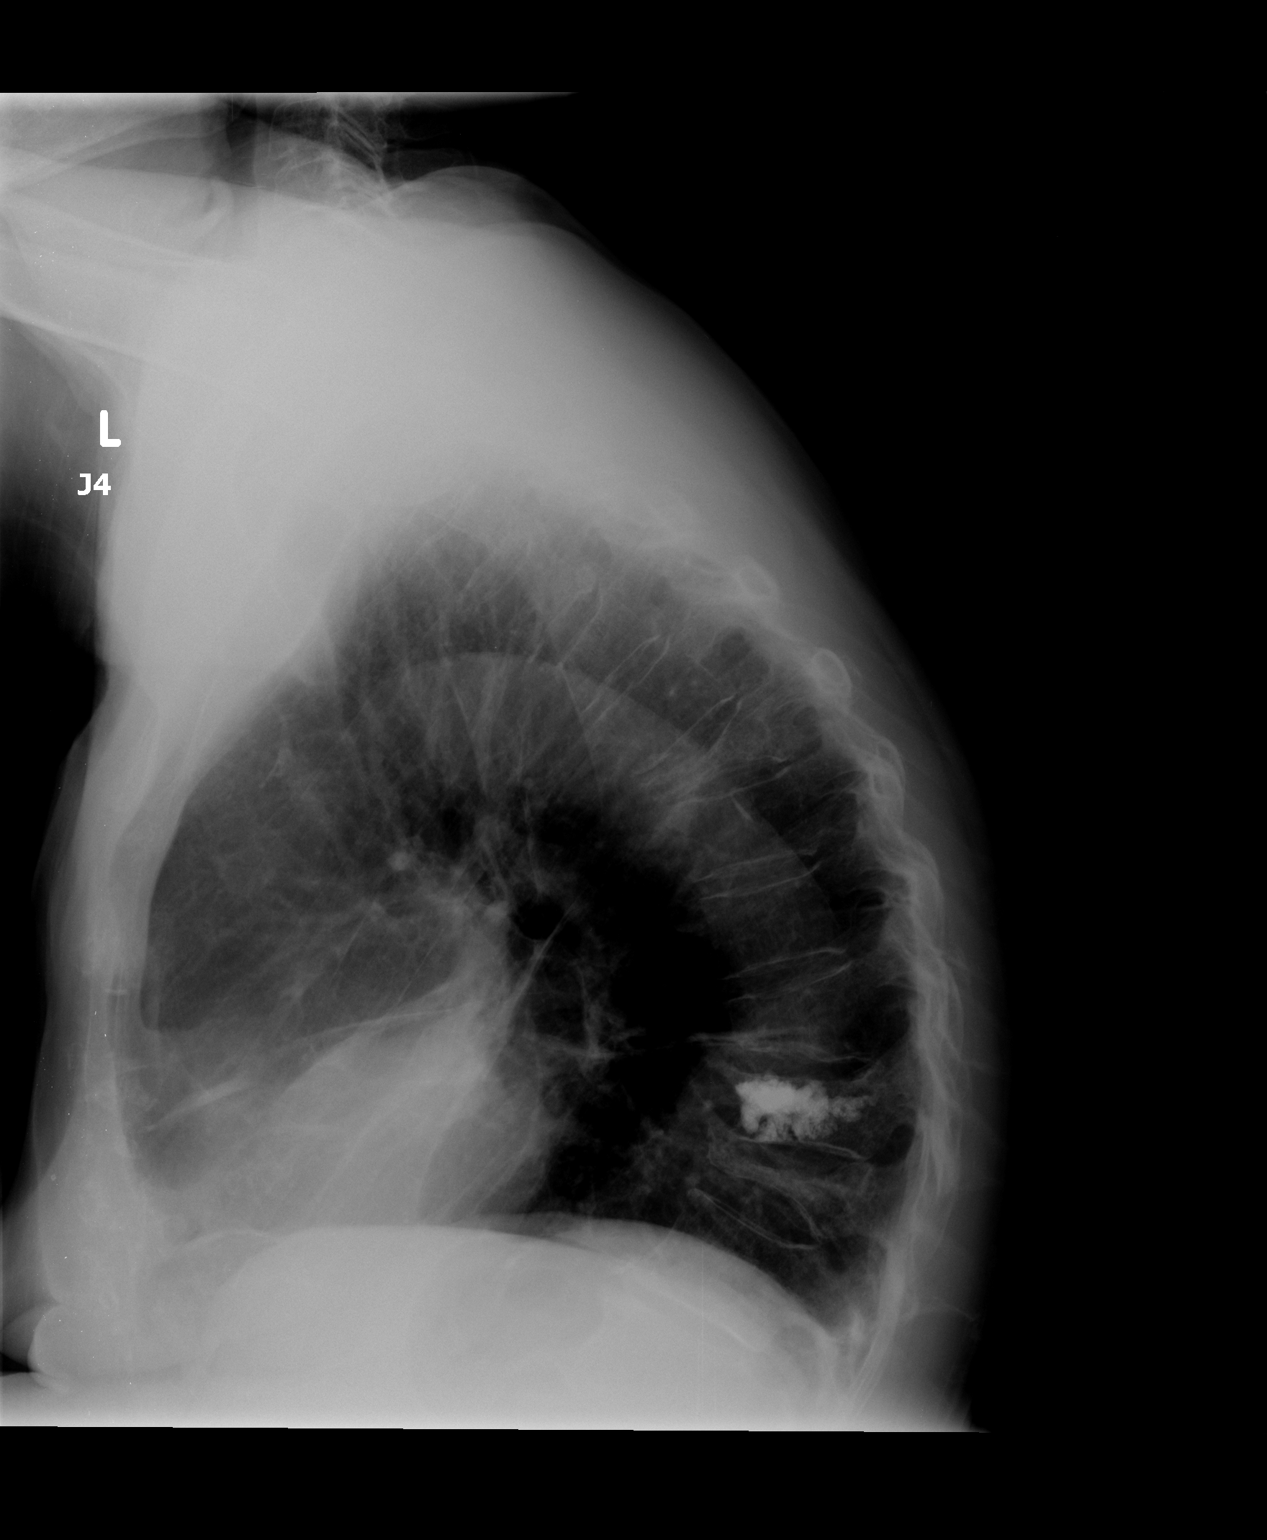

[2 of 2 positions shown; findings below may reference images not displayed]

FINDINGS: The heart is enlarged.  There are streaky densities in
the lung bases bilaterally, most likely representing subsegmental
atelectasis.  There are no focal consolidations or pleural
effusions.  No pulmonary edema.  The patient has had previous
vertebral plasty at T11.  Other wedge compression fractures are
identified at T12, T10, T7, and T5.  The fracture at T10 appears
new since MRI 12/12/2012.
IMPRESSION: 1.  Bilateral subsegmental atelectasis.
2.  No evidence for new consolidation or pulmonary edema.
3.  Cardiomegaly.
4.  New wedge compression fracture at T10.

## 2013-06-02 MED ORDER — CYCLOPHOSPHAMIDE CHEMO INJECTION 1 GM
250.0000 mg/m2 | Freq: Once | INTRAMUSCULAR | Status: AC
  Administered 2013-06-02: 440 mg via INTRAVENOUS
  Filled 2013-06-02: qty 22

## 2013-06-02 MED ORDER — PROCHLORPERAZINE MALEATE 10 MG PO TABS: 10.0000 mg | ORAL_TABLET | Freq: Four times a day (QID) | ORAL | Status: DC | PRN

## 2013-06-02 MED ORDER — ONDANSETRON HCL 8 MG PO TABS: 8.0000 mg | ORAL_TABLET | Freq: Two times a day (BID) | ORAL | Status: DC | PRN

## 2013-06-02 MED ORDER — DEXAMETHASONE SODIUM PHOSPHATE 20 MG/5ML IJ SOLN
20.0000 mg | Freq: Once | INTRAMUSCULAR | Status: AC
  Administered 2013-06-02: 20 mg via INTRAVENOUS
  Filled 2013-06-02: qty 5

## 2013-06-02 MED ORDER — BORTEZOMIB CHEMO SQ INJECTION 3.5 MG (2.5MG/ML)
1.3000 mg/m2 | Freq: Once | INTRAMUSCULAR | Status: AC
  Administered 2013-06-02: 2.25 mg via SUBCUTANEOUS
  Filled 2013-06-02: qty 2.25

## 2013-06-02 MED ORDER — ONDANSETRON 16 MG/50ML IVPB (CHCC)
16.0000 mg | Freq: Once | INTRAVENOUS | Status: AC
  Administered 2013-06-02: 16 mg via INTRAVENOUS

## 2013-06-02 NOTE — Patient Instructions (Signed)
Bortezomib injection What is this medicine? BORTEZOMIB (bor TEZ oh mib) is a chemotherapy drug. It slows the growth of cancer cells. This medicine is used to treat multiple myeloma, lymphoma, and other cancers. This medicine may be used for other purposes; ask your health care provider or pharmacist if you have questions. What should I tell my health care provider before I take this medicine? They need to know if you have any of these conditions: -heart disease -irregular heartbeat -liver disease -low blood counts, like low white blood cells, platelets, or hemoglobin -peripheral neuropathy -taking medicine for blood pressure -an unusual or allergic reaction to bortezomib, mannitol, boron, other medicines, foods, dyes, or preservatives -pregnant or trying to get pregnant -breast-feeding How should I use this medicine? This medicine is for injection into a vein or for injection under the skin. It is given by a health care professional in a hospital or clinic setting. Talk to your pediatrician regarding the use of this medicine in children. Special care may be needed. Overdosage: If you think you have taken too much of this medicine contact a poison control center or emergency room at once. NOTE: This medicine is only for you. Do not share this medicine with others. What if I miss a dose? It is important not to miss your dose. Call your doctor or health care professional if you are unable to keep an appointment. What may interact with this medicine? -medicines for diabetes -medicines to increase blood counts like filgrastim, pegfilgrastim, sargramostim -zalcitabine Talk to your doctor or health care professional before taking any of these medicines: -acetaminophen -aspirin -ibuprofen -ketoprofen -naproxen This list may not describe all possible interactions. Give your health care provider a list of all the medicines, herbs, non-prescription drugs, or dietary supplements you use. Also  tell them if you smoke, drink alcohol, or use illegal drugs. Some items may interact with your medicine. What should I watch for while using this medicine? Visit your doctor for checks on your progress. This drug may make you feel generally unwell. This is not uncommon, as chemotherapy can affect healthy cells as well as cancer cells. Report any side effects. Continue your course of treatment even though you feel ill unless your doctor tells you to stop. You may get drowsy or dizzy. Do not drive, use machinery, or do anything that needs mental alertness until you know how this medicine affects you. Do not stand or sit up quickly, especially if you are an older patient. This reduces the risk of dizzy or fainting spells. In some cases, you may be given additional medicines to help with side effects. Follow all directions for their use. Call your doctor or health care professional for advice if you get a fever, chills or sore throat, or other symptoms of a cold or flu. Do not treat yourself. This drug decreases your body's ability to fight infections. Try to avoid being around people who are sick. This medicine may increase your risk to bruise or bleed. Call your doctor or health care professional if you notice any unusual bleeding. Be careful brushing and flossing your teeth or using a toothpick because you may get an infection or bleed more easily. If you have any dental work done, tell your dentist you are receiving this medicine. Avoid taking products that contain aspirin, acetaminophen, ibuprofen, naproxen, or ketoprofen unless instructed by your doctor. These medicines may hide a fever. Do not become pregnant while taking this medicine. Women should inform their doctor if they wish to   become pregnant or think they might be pregnant. There is a potential for serious side effects to an unborn child. Talk to your health care professional or pharmacist for more information. Do not breast-feed an infant while  taking this medicine. You may have vomiting or diarrhea while taking this medicine. Drink water or other fluids as directed. What side effects may I notice from receiving this medicine? Side effects that you should report to your doctor or health care professional as soon as possible: -allergic reactions like skin rash, itching or hives, swelling of the face, lips, or tongue -breathing problems -changes in hearing -changes in vision -fast, irregular heartbeat -feeling faint or lightheaded, falls -pain, tingling, numbness in the hands or feet -seizures -swelling of the ankles, feet, hands -unusual bleeding or bruising -unusually weak or tired -vomiting Side effects that usually do not require medical attention (report to your doctor or health care professional if they continue or are bothersome): -changes in emotions or moods -constipation -diarrhea -loss of appetite -headache -irritation at site where injected -nausea This list may not describe all possible side effects. Call your doctor for medical advice about side effects. You may report side effects to FDA at 1-800-FDA-1088. Where should I keep my medicine? This drug is given in a hospital or clinic and will not be stored at home. NOTE: This sheet is a summary. It may not cover all possible information. If you have questions about this medicine, talk to your doctor, pharmacist, or health care provider.  2013, [employer]. (11/12/2010 11:42:36 AM)   Cyclophosphamide injection What is this medicine? CYCLOPHOSPHAMIDE (sye kloe FOSS fa mide) is a chemotherapy drug. It slows the growth of cancer cells. This medicine is used to treat many types of cancer like lymphoma, myeloma, leukemia, breast cancer, and ovarian cancer, to name a few. It is also used to treat nephrotic syndrome in children. This medicine may be used for other purposes; ask your health care provider or pharmacist if you have questions. What should I tell  my health care provider before I take this medicine? They need to know if you have any of these conditions: -blood disorders -history of other chemotherapy -history of radiation therapy -infection -kidney disease -liver disease -tumors in the bone marrow -an unusual or allergic reaction to cyclophosphamide, other chemotherapy, other medicines, foods, dyes, or preservatives -pregnant or trying to get pregnant -breast-feeding How should I use this medicine? This drug is usually given as an injection into a vein or muscle or by infusion into a vein. It is administered in a hospital or clinic by a specially trained health care professional. Talk to your pediatrician regarding the use of this medicine in children. While this drug may be prescribed for selected conditions, precautions do apply. Overdosage: If you think you have taken too much of this medicine contact a poison control center or emergency room at once. NOTE: This medicine is only for you. Do not share this medicine with others. What if I miss a dose? It is important not to miss your dose. Call your doctor or health care professional if you are unable to keep an appointment. What may interact with this medicine? Do not take this medicine with any of the following medications: -mibefradil -nalidixic acid This medicine may also interact with the following medications: -doxorubicin -etanercept -medicines to increase blood counts like filgrastim, pegfilgrastim, sargramostim -medicines that block muscle or nerve pain -St. John's Wort -phenobarbital -succinylcholine chloride -trastuzumab -vaccines Talk to your doctor or health care  professional before taking any of these medicines: -acetaminophen -aspirin -ibuprofen -ketoprofen -naproxen This list may not describe all possible interactions. Give your health care provider a list of all the medicines, herbs, non-prescription drugs, or dietary supplements you use. Also tell them  if you smoke, drink alcohol, or use illegal drugs. Some items may interact with your medicine. What should I watch for while using this medicine? Visit your doctor for checks on your progress. This drug may make you feel generally unwell. This is not uncommon, as chemotherapy can affect healthy cells as well as cancer cells. Report any side effects. Continue your course of treatment even though you feel ill unless your doctor tells you to stop. Drink water or other fluids as directed. Urinate often, even at night. In some cases, you may be given additional medicines to help with side effects. Follow all directions for their use. Call your doctor or health care professional for advice if you get a fever, chills or sore throat, or other symptoms of a cold or flu. Do not treat yourself. This drug decreases your body's ability to fight infections. Try to avoid being around people who are sick. This medicine may increase your risk to bruise or bleed. Call your doctor or health care professional if you notice any unusual bleeding. Be careful brushing and flossing your teeth or using a toothpick because you may get an infection or bleed more easily. If you have any dental work done, tell your dentist you are receiving this medicine. Avoid taking products that contain aspirin, acetaminophen, ibuprofen, naproxen, or ketoprofen unless instructed by your doctor. These medicines may hide a fever. Do not become pregnant while taking this medicine. Women should inform their doctor if they wish to become pregnant or think they might be pregnant. There is a potential for serious side effects to an unborn child. Talk to your health care professional or pharmacist for more information. Do not breast-feed an infant while taking this medicine. Men should inform their doctor if they wish to father a child. This medicine may lower sperm counts. If you are going to have surgery, tell your doctor or health care professional that  you have taken this medicine. What side effects may I notice from receiving this medicine? Side effects that you should report to your doctor or health care professional as soon as possible: -allergic reactions like skin rash, itching or hives, swelling of the face, lips, or tongue -low blood counts - this medicine may decrease the number of white blood cells, red blood cells and platelets. You may be at increased risk for infections and bleeding. -signs of infection - fever or chills, cough, sore throat, pain or difficulty passing urine -signs of decreased platelets or bleeding - bruising, pinpoint red spots on the skin, black, tarry stools, blood in the urine -signs of decreased red blood cells - unusually weak or tired, fainting spells, lightheadedness -breathing problems -dark urine -mouth sores -pain, swelling, redness at site where injected -swelling of the ankles, feet, hands -trouble passing urine or change in the amount of urine -weight gain -yellowing of the eyes or skin Side effects that usually do not require medical attention (report to your doctor or health care professional if they continue or are bothersome): -changes in nail or skin color -diarrhea -hair loss -loss of appetite -missed menstrual periods -nausea, vomiting -stomach pain This list may not describe all possible side effects. Call your doctor for medical advice about side effects. You may report side effects  to FDA at 1-800-FDA-1088. Where should I keep my medicine? This drug is given in a hospital or clinic and will not be stored at home. NOTE: This sheet is a summary. It may not cover all possible information. If you have questions about this medicine, talk to your doctor, pharmacist, or health care provider.  2013, [employer]. (01/10/2008 2:32:25 PM)

## 2013-06-08 ENCOUNTER — Other Ambulatory Visit: Payer: Medicare Other | Admitting: Lab

## 2013-06-08 ENCOUNTER — Ambulatory Visit: Payer: Medicare Other

## 2013-06-08 ENCOUNTER — Ambulatory Visit: Payer: Medicare Other | Admitting: Hematology & Oncology

## 2013-06-15 ENCOUNTER — Ambulatory Visit (HOSPITAL_BASED_OUTPATIENT_CLINIC_OR_DEPARTMENT_OTHER): Payer: Medicare Other

## 2013-06-15 ENCOUNTER — Ambulatory Visit (HOSPITAL_BASED_OUTPATIENT_CLINIC_OR_DEPARTMENT_OTHER): Payer: Medicare Other | Admitting: Lab

## 2013-06-15 ENCOUNTER — Ambulatory Visit (HOSPITAL_BASED_OUTPATIENT_CLINIC_OR_DEPARTMENT_OTHER): Payer: Medicare Other | Admitting: Hematology & Oncology

## 2013-06-15 ENCOUNTER — Other Ambulatory Visit: Payer: Self-pay | Admitting: *Deleted

## 2013-06-15 VITALS — BP 136/60 | HR 89 | Temp 98.4°F | Resp 16 | Ht 65.0 in | Wt 137.0 lb

## 2013-06-15 DIAGNOSIS — C773 Secondary and unspecified malignant neoplasm of axilla and upper limb lymph nodes: Secondary | ICD-10-CM

## 2013-06-15 DIAGNOSIS — D631 Anemia in chronic kidney disease: Secondary | ICD-10-CM

## 2013-06-15 DIAGNOSIS — C9000 Multiple myeloma not having achieved remission: Secondary | ICD-10-CM

## 2013-06-15 DIAGNOSIS — N183 Chronic kidney disease, stage 3 unspecified: Secondary | ICD-10-CM | POA: Diagnosis not present

## 2013-06-15 DIAGNOSIS — D6481 Anemia due to antineoplastic chemotherapy: Secondary | ICD-10-CM

## 2013-06-15 DIAGNOSIS — Z5112 Encounter for antineoplastic immunotherapy: Secondary | ICD-10-CM | POA: Diagnosis not present

## 2013-06-15 DIAGNOSIS — C779 Secondary and unspecified malignant neoplasm of lymph node, unspecified: Secondary | ICD-10-CM

## 2013-06-15 DIAGNOSIS — C50919 Malignant neoplasm of unspecified site of unspecified female breast: Secondary | ICD-10-CM

## 2013-06-15 DIAGNOSIS — C50911 Malignant neoplasm of unspecified site of right female breast: Secondary | ICD-10-CM

## 2013-06-15 DIAGNOSIS — Z5111 Encounter for antineoplastic chemotherapy: Secondary | ICD-10-CM | POA: Diagnosis not present

## 2013-06-15 LAB — CBC WITH DIFFERENTIAL (CANCER CENTER ONLY)
Eosinophils Absolute: 0 10*3/uL (ref 0.0–0.5)
LYMPH#: 0.7 10*3/uL — ABNORMAL LOW (ref 0.9–3.3)
MCV: 101 fL (ref 81–101)
MONO#: 0.7 10*3/uL (ref 0.1–0.9)
NEUT#: 4.5 10*3/uL (ref 1.5–6.5)
Platelets: 280 10*3/uL (ref 145–400)
RBC: 2.71 10*6/uL — ABNORMAL LOW (ref 3.70–5.32)
WBC: 6 10*3/uL (ref 3.9–10.0)

## 2013-06-15 LAB — BASIC METABOLIC PANEL - CANCER CENTER ONLY
Chloride: 95 mEq/L — ABNORMAL LOW (ref 98–108)
Glucose, Bld: 102 mg/dL (ref 73–118)
Potassium: 3.2 mEq/L — ABNORMAL LOW (ref 3.3–4.7)
Sodium: 130 mEq/L (ref 128–145)

## 2013-06-15 MED ORDER — TRASTUZUMAB CHEMO INJECTION 440 MG
6.0000 mg/kg | Freq: Once | INTRAVENOUS | Status: AC
  Administered 2013-06-15: 420 mg via INTRAVENOUS
  Filled 2013-06-15: qty 20

## 2013-06-15 MED ORDER — SODIUM CHLORIDE 0.9 % IV SOLN
Freq: Once | INTRAVENOUS | Status: AC
Start: 2013-06-15 — End: 2013-06-15
  Administered 2013-06-15: 12:00:00 via INTRAVENOUS

## 2013-06-15 MED ORDER — DIPHENHYDRAMINE HCL 25 MG PO CAPS: 50.0000 mg | ORAL_CAPSULE | Freq: Once | ORAL | Status: DC

## 2013-06-15 MED ORDER — SODIUM CHLORIDE 0.9 % IV SOLN
250.0000 mg/m2 | Freq: Once | INTRAVENOUS | Status: AC
  Administered 2013-06-15: 440 mg via INTRAVENOUS
  Filled 2013-06-15: qty 22

## 2013-06-15 MED ORDER — DARBEPOETIN ALFA-POLYSORBATE 300 MCG/0.6ML IJ SOLN
300.0000 ug | Freq: Once | INTRAMUSCULAR | Status: AC
  Administered 2013-06-15: 300 ug via SUBCUTANEOUS

## 2013-06-15 MED ORDER — DEXAMETHASONE SODIUM PHOSPHATE 20 MG/5ML IJ SOLN
20.0000 mg | Freq: Once | INTRAMUSCULAR | Status: AC
  Administered 2013-06-15: 20 mg via INTRAVENOUS
  Filled 2013-06-15: qty 5

## 2013-06-15 MED ORDER — ONDANSETRON 16 MG/50ML IVPB (CHCC)
16.0000 mg | Freq: Once | INTRAVENOUS | Status: AC
  Administered 2013-06-15: 16 mg via INTRAVENOUS

## 2013-06-15 MED ORDER — SODIUM CHLORIDE 0.9 % IV SOLN
3.0000 mg | Freq: Once | INTRAVENOUS | Status: AC
  Administered 2013-06-15: 3 mg via INTRAVENOUS
  Filled 2013-06-15: qty 3.75

## 2013-06-15 MED ORDER — ACETAMINOPHEN 325 MG PO TABS
650.0000 mg | ORAL_TABLET | Freq: Once | ORAL | Status: AC
  Administered 2013-06-15: 650 mg via ORAL

## 2013-06-15 MED ORDER — SODIUM CHLORIDE 0.9 % IV SOLN
420.0000 mg | Freq: Once | INTRAVENOUS | Status: AC
  Administered 2013-06-15: 420 mg via INTRAVENOUS
  Filled 2013-06-15: qty 14

## 2013-06-15 MED ORDER — HYDROMORPHONE HCL 2 MG PO TABS: 2.0000 mg | ORAL_TABLET | Freq: Four times a day (QID) | ORAL | Status: DC | PRN

## 2013-06-15 MED ORDER — BORTEZOMIB CHEMO SQ INJECTION 3.5 MG (2.5MG/ML)
1.3000 mg/m2 | Freq: Once | INTRAMUSCULAR | Status: AC
  Administered 2013-06-15: 2.25 mg via SUBCUTANEOUS
  Filled 2013-06-15: qty 2.25

## 2013-06-15 NOTE — Progress Notes (Signed)
This office note has been dictated.

## 2013-06-15 NOTE — Patient Instructions (Addendum)
Bortezomib injection What is this medicine? BORTEZOMIB (bor TEZ oh mib) is a chemotherapy drug. It slows the growth of cancer cells. This medicine is used to treat multiple myeloma, lymphoma, and other cancers. This medicine may be used for other purposes; ask your health care provider or pharmacist if you have questions. What should I tell my health care provider before I take this medicine? They need to know if you have any of these conditions: -heart disease -irregular heartbeat -liver disease -low blood counts, like low white blood cells, platelets, or hemoglobin -peripheral neuropathy -taking medicine for blood pressure -an unusual or allergic reaction to bortezomib, mannitol, boron, other medicines, foods, dyes, or preservatives -pregnant or trying to get pregnant -breast-feeding How should I use this medicine? This medicine is for injection into a vein or for injection under the skin. It is given by a health care professional in a hospital or clinic setting. Talk to your pediatrician regarding the use of this medicine in children. Special care may be needed. Overdosage: If you think you have taken too much of this medicine contact a poison control center or emergency room at once. NOTE: This medicine is only for you. Do not share this medicine with others. What if I miss a dose? It is important not to miss your dose. Call your doctor or health care professional if you are unable to keep an appointment. What may interact with this medicine? -medicines for diabetes -medicines to increase blood counts like filgrastim, pegfilgrastim, sargramostim -zalcitabine Talk to your doctor or health care professional before taking any of these medicines: -acetaminophen -aspirin -ibuprofen -ketoprofen -naproxen This list may not describe all possible interactions. Give your health care provider a list of all the medicines, herbs, non-prescription drugs, or dietary supplements you use. Also  tell them if you smoke, drink alcohol, or use illegal drugs. Some items may interact with your medicine. What should I watch for while using this medicine? Visit your doctor for checks on your progress. This drug may make you feel generally unwell. This is not uncommon, as chemotherapy can affect healthy cells as well as cancer cells. Report any side effects. Continue your course of treatment even though you feel ill unless your doctor tells you to stop. You may get drowsy or dizzy. Do not drive, use machinery, or do anything that needs mental alertness until you know how this medicine affects you. Do not stand or sit up quickly, especially if you are an older patient. This reduces the risk of dizzy or fainting spells. In some cases, you may be given additional medicines to help with side effects. Follow all directions for their use. Call your doctor or health care professional for advice if you get a fever, chills or sore throat, or other symptoms of a cold or flu. Do not treat yourself. This drug decreases your body's ability to fight infections. Try to avoid being around people who are sick. This medicine may increase your risk to bruise or bleed. Call your doctor or health care professional if you notice any unusual bleeding. Be careful brushing and flossing your teeth or using a toothpick because you may get an infection or bleed more easily. If you have any dental work done, tell your dentist you are receiving this medicine. Avoid taking products that contain aspirin, acetaminophen, ibuprofen, naproxen, or ketoprofen unless instructed by your doctor. These medicines may hide a fever. Do not become pregnant while taking this medicine. Women should inform their doctor if they wish to   become pregnant or think they might be pregnant. There is a potential for serious side effects to an unborn child. Talk to your health care professional or pharmacist for more information. Do not breast-feed an infant while  taking this medicine. You may have vomiting or diarrhea while taking this medicine. Drink water or other fluids as directed. What side effects may I notice from receiving this medicine? Side effects that you should report to your doctor or health care professional as soon as possible: -allergic reactions like skin rash, itching or hives, swelling of the face, lips, or tongue -breathing problems -changes in hearing -changes in vision -fast, irregular heartbeat -feeling faint or lightheaded, falls -pain, tingling, numbness in the hands or feet -seizures -swelling of the ankles, feet, hands -unusual bleeding or bruising -unusually weak or tired -vomiting Side effects that usually do not require medical attention (report to your doctor or health care professional if they continue or are bothersome): -changes in emotions or moods -constipation -diarrhea -loss of appetite -headache -irritation at site where injected -nausea This list may not describe all possible side effects. Call your doctor for medical advice about side effects. You may report side effects to FDA at 1-800-FDA-1088. Where should I keep my medicine? This drug is given in a hospital or clinic and will not be stored at home. NOTE: This sheet is a summary. It may not cover all possible information. If you have questions about this medicine, talk to your doctor, pharmacist, or health care provider.  2013, [employer]. (11/12/2010 11:42:36 AM) Pertuzumab injection What is this medicine? PERTUZUMAB is a monoclonal antibody that targets a protein called HER2. HER2 is found in some breast cancers. This medicine can stop cancer cell growth. This medicine is used with other cancer treatments. This medicine may be used for other purposes; ask your health care provider or pharmacist if you have questions. What should I tell my health care provider before I take this medicine? They need to know if you have any of these  conditions: -heart disease -heart failure -high blood pressure -history of irregular heart beat -recent or ongoing radiation therapy -an unusual or allergic reaction to pertuzumab, other medicines, foods, dyes, or preservatives -pregnant or trying to get pregnant -breast-feeding How should I use this medicine? This medicine is for infusion into a vein. It is given by a health care professional in a hospital or clinic setting. Talk to your pediatrician regarding the use of this medicine in children. Special care may be needed. Overdosage: If you think you've taken too much of this medicine contact a poison control center or emergency room at once. Overdosage: If you think you have taken too much of this medicine contact a poison control center or emergency room at once. NOTE: This medicine is only for you. Do not share this medicine with others. What if I miss a dose? It is important not to miss your dose. Call your doctor or health care professional if you are unable to keep an appointment. What may interact with this medicine? Interactions are not expected. Give your health care provider a list of all the medicines, herbs, non-prescription drugs, or dietary supplements you use. Also tell them if you smoke, drink alcohol, or use illegal drugs. Some items may interact with your medicine. This list may not describe all possible interactions. Give your health care provider a list of all the medicines, herbs, non-prescription drugs, or dietary supplements you use. Also tell them if you smoke, drink alcohol, or  use illegal drugs. Some items may interact with your medicine. What should I watch for while using this medicine? Your condition will be monitored carefully while you are receiving this medicine. Report any side effects. Continue your course of treatment even though you feel ill unless your doctor tells you to stop. Do not become pregnant while taking this medicine. Women should inform their  doctor if they wish to become pregnant or think they might be pregnant. There is a potential for serious side effects to an unborn child. Talk to your health care professional or pharmacist for more information. Do not breast-feed an infant while taking this medicine. Call your doctor or health care professional for advice if you get a fever, chills or sore throat, or other symptoms of a cold or flu. Do not treat yourself. Try to avoid being around people who are sick. You may experience fever, chills, and headache during the infusion. Report any side effects during the infusion to your health care professional. What side effects may I notice from receiving this medicine? Side effects that you should report to your doctor or health care professional as soon as possible: -breathing problems -chest pain or palpitations -dizziness -feeling faint or lightheaded -fever or chills -skin rash, itching or hives -sore throat -swelling of the face, lips, or tongue -swelling of the legs or ankles -unusually weak or tired  Side effects that usually do not require medical attention (Report these to your doctor or health care professional if they continue or are bothersome.): -diarrhea -hair loss -nausea, vomiting -tiredness This list may not describe all possible side effects. Call your doctor for medical advice about side effects. You may report side effects to FDA at 1-800-FDA-1088. Where should I keep my medicine? This drug is given in a hospital or clinic and will not be stored at home. NOTE: This sheet is a summary. It may not cover all possible information. If you have questions about this medicine, talk to your doctor, pharmacist, or health care provider.  2013, [employer]. (04/01/2011 4:45:56 PM) Trastuzumab injection for infusion What is this medicine? TRASTUZUMAB (tras TOO zoo mab) is a monoclonal antibody. It targets a protein called HER2. This protein is found in some stomach and  breast cancers. This medicine can stop cancer cell growth. This medicine may be used with other cancer treatments. This medicine may be used for other purposes; ask your health care provider or pharmacist if you have questions. What should I tell my health care provider before I take this medicine? They need to know if you have any of these conditions: -heart disease -heart failure -infection (especially a virus infection such as chickenpox, cold sores, or herpes) -lung or breathing disease, like asthma -recent or ongoing radiation therapy -an unusual or allergic reaction to trastuzumab, benzyl alcohol, or other medications, foods, dyes, or preservatives -pregnant or trying to get pregnant -breast-feeding How should I use this medicine? This drug is given as an infusion into a vein. It is administered in a hospital or clinic by a specially trained health care professional. Talk to your pediatrician regarding the use of this medicine in children. This medicine is not approved for use in children. Overdosage: If you think you have taken too much of this medicine contact a poison control center or emergency room at once. NOTE: This medicine is only for you. Do not share this medicine with others. What if I miss a dose? It is important not to miss a dose. Call your doctor  or health care professional if you are unable to keep an appointment. What may interact with this medicine? -cyclophosphamide -doxorubicin -warfarin This list may not describe all possible interactions. Give your health care provider a list of all the medicines, herbs, non-prescription drugs, or dietary supplements you use. Also tell them if you smoke, drink alcohol, or use illegal drugs. Some items may interact with your medicine. What should I watch for while using this medicine? Visit your doctor for checks on your progress. Report any side effects. Continue your course of treatment even though you feel ill unless your  doctor tells you to stop. Call your doctor or health care professional for advice if you get a fever, chills or sore throat, or other symptoms of a cold or flu. Do not treat yourself. Try to avoid being around people who are sick. You may experience fever, chills and shaking during your first infusion. These effects are usually mild and can be treated with other medicines. Report any side effects during the infusion to your health care professional. Fever and chills usually do not happen with later infusions. What side effects may I notice from receiving this medicine? Side effects that you should report to your doctor or other health care professional as soon as possible: -breathing difficulties -chest pain or palpitations -cough -dizziness or fainting -fever or chills, sore throat -skin rash, itching or hives -swelling of the legs or ankles -unusually weak or tired Side effects that usually do not require medical attention (report to your doctor or other health care professional if they continue or are bothersome): -loss of appetite -headache -muscle aches -nausea This list may not describe all possible side effects. Call your doctor for medical advice about side effects. You may report side effects to FDA at 1-800-FDA-1088. Where should I keep my medicine? This drug is given in a hospital or clinic and will not be stored at home. NOTE: This sheet is a summary. It may not cover all possible information. If you have questions about this medicine, talk to your doctor, pharmacist, or health care provider.  2013, [employer]. (08/09/2009 1:43:15 PM) Zoledronic Acid injection (Hypercalcemia, Oncology) What is this medicine? ZOLEDRONIC ACID (ZOE le dron ik AS id) lowers the amount of calcium loss from bone. It is used to treat too much calcium in your blood from cancer. It is also used to prevent complications of cancer that has spread to the bone. This medicine may be used for  other purposes; ask your health care provider or pharmacist if you have questions. What should I tell my health care provider before I take this medicine? They need to know if you have any of these conditions: -aspirin-sensitive asthma -dental disease -kidney disease -an unusual or allergic reaction to zoledronic acid, other medicines, foods, dyes, or preservatives -pregnant or trying to get pregnant -breast-feeding How should I use this medicine? This medicine is for infusion into a vein. It is given by a health care professional in a hospital or clinic setting. Talk to your pediatrician regarding the use of this medicine in children. Special care may be needed. Overdosage: If you think you have taken too much of this medicine contact a poison control center or emergency room at once. NOTE: This medicine is only for you. Do not share this medicine with others. What if I miss a dose? It is important not to miss your dose. Call your doctor or health care professional if you are unable to keep an appointment. What may  interact with this medicine? -certain antibiotics given by injection -NSAIDs, medicines for pain and inflammation, like ibuprofen or naproxen -some diuretics like bumetanide, furosemide -teriparatide -thalidomide This list may not describe all possible interactions. Give your health care provider a list of all the medicines, herbs, non-prescription drugs, or dietary supplements you use. Also tell them if you smoke, drink alcohol, or use illegal drugs. Some items may interact with your medicine. What should I watch for while using this medicine? Visit your doctor or health care professional for regular checkups. It may be some time before you see the benefit from this medicine. Do not stop taking your medicine unless your doctor tells you to. Your doctor may order blood tests or other tests to see how you are doing. Women should inform their doctor if they wish to become pregnant  or think they might be pregnant. There is a potential for serious side effects to an unborn child. Talk to your health care professional or pharmacist for more information. You should make sure that you get enough calcium and vitamin D while you are taking this medicine. Discuss the foods you eat and the vitamins you take with your health care professional. Some people who take this medicine have severe bone, joint, and/or muscle pain. This medicine may also increase your risk for a broken thigh bone. Tell your doctor right away if you have pain in your upper leg or groin. Tell your doctor if you have any pain that does not go away or that gets worse. What side effects may I notice from receiving this medicine? Side effects that you should report to your doctor or health care professional as soon as possible: -allergic reactions like skin rash, itching or hives, swelling of the face, lips, or tongue -anxiety, confusion, or depression -breathing problems -changes in vision -feeling faint or lightheaded, falls -jaw burning, cramping, pain -muscle cramps, stiffness, or weakness -trouble passing urine or change in the amount of urine Side effects that usually do not require medical attention (report to your doctor or health care professional if they continue or are bothersome): -bone, joint, or muscle pain -fever -hair loss -irritation at site where injected -loss of appetite -nausea, vomiting -stomach upset -tired This list may not describe all possible side effects. Call your doctor for medical advice about side effects. You may report side effects to FDA at 1-800-FDA-1088. Where should I keep my medicine? This drug is given in a hospital or clinic and will not be stored at home. NOTE: This sheet is a summary. It may not cover all possible information. If you have questions about this medicine, talk to your doctor, pharmacist, or health care provider.  2013, [employer].  (04/03/2011 9:06:58 AM) Cyclophosphamide injection What is this medicine? CYCLOPHOSPHAMIDE (sye kloe FOSS fa mide) is a chemotherapy drug. It slows the growth of cancer cells. This medicine is used to treat many types of cancer like lymphoma, myeloma, leukemia, breast cancer, and ovarian cancer, to name a few. It is also used to treat nephrotic syndrome in children. This medicine may be used for other purposes; ask your health care provider or pharmacist if you have questions. What should I tell my health care provider before I take this medicine? They need to know if you have any of these conditions: -blood disorders -history of other chemotherapy -history of radiation therapy -infection -kidney disease -liver disease -tumors in the bone marrow -an unusual or allergic reaction to cyclophosphamide, other chemotherapy, other medicines, foods, dyes, or  preservatives -pregnant or trying to get pregnant -breast-feeding How should I use this medicine? This drug is usually given as an injection into a vein or muscle or by infusion into a vein. It is administered in a hospital or clinic by a specially trained health care professional. Talk to your pediatrician regarding the use of this medicine in children. While this drug may be prescribed for selected conditions, precautions do apply. Overdosage: If you think you have taken too much of this medicine contact a poison control center or emergency room at once. NOTE: This medicine is only for you. Do not share this medicine with others. What if I miss a dose? It is important not to miss your dose. Call your doctor or health care professional if you are unable to keep an appointment. What may interact with this medicine? Do not take this medicine with any of the following medications: -mibefradil -nalidixic acid This medicine may also interact with the following medications: -doxorubicin -etanercept -medicines to increase blood counts like  filgrastim, pegfilgrastim, sargramostim -medicines that block muscle or nerve pain -St. John's Wort -phenobarbital -succinylcholine chloride -trastuzumab -vaccines Talk to your doctor or health care professional before taking any of these medicines: -acetaminophen -aspirin -ibuprofen -ketoprofen -naproxen This list may not describe all possible interactions. Give your health care provider a list of all the medicines, herbs, non-prescription drugs, or dietary supplements you use. Also tell them if you smoke, drink alcohol, or use illegal drugs. Some items may interact with your medicine. What should I watch for while using this medicine? Visit your doctor for checks on your progress. This drug may make you feel generally unwell. This is not uncommon, as chemotherapy can affect healthy cells as well as cancer cells. Report any side effects. Continue your course of treatment even though you feel ill unless your doctor tells you to stop. Drink water or other fluids as directed. Urinate often, even at night. In some cases, you may be given additional medicines to help with side effects. Follow all directions for their use. Call your doctor or health care professional for advice if you get a fever, chills or sore throat, or other symptoms of a cold or flu. Do not treat yourself. This drug decreases your body's ability to fight infections. Try to avoid being around people who are sick. This medicine may increase your risk to bruise or bleed. Call your doctor or health care professional if you notice any unusual bleeding. Be careful brushing and flossing your teeth or using a toothpick because you may get an infection or bleed more easily. If you have any dental work done, tell your dentist you are receiving this medicine. Avoid taking products that contain aspirin, acetaminophen, ibuprofen, naproxen, or ketoprofen unless instructed by your doctor. These medicines may hide a fever. Do not become  pregnant while taking this medicine. Women should inform their doctor if they wish to become pregnant or think they might be pregnant. There is a potential for serious side effects to an unborn child. Talk to your health care professional or pharmacist for more information. Do not breast-feed an infant while taking this medicine. Men should inform their doctor if they wish to father a child. This medicine may lower sperm counts. If you are going to have surgery, tell your doctor or health care professional that you have taken this medicine. What side effects may I notice from receiving this medicine? Side effects that you should report to your doctor or health care professional as  soon as possible: -allergic reactions like skin rash, itching or hives, swelling of the face, lips, or tongue -low blood counts - this medicine may decrease the number of white blood cells, red blood cells and platelets. You may be at increased risk for infections and bleeding. -signs of infection - fever or chills, cough, sore throat, pain or difficulty passing urine -signs of decreased platelets or bleeding - bruising, pinpoint red spots on the skin, black, tarry stools, blood in the urine -signs of decreased red blood cells - unusually weak or tired, fainting spells, lightheadedness -breathing problems -dark urine -mouth sores -pain, swelling, redness at site where injected -swelling of the ankles, feet, hands -trouble passing urine or change in the amount of urine -weight gain -yellowing of the eyes or skin Side effects that usually do not require medical attention (report to your doctor or health care professional if they continue or are bothersome): -changes in nail or skin color -diarrhea -hair loss -loss of appetite -missed menstrual periods -nausea, vomiting -stomach pain This list may not describe all possible side effects. Call your doctor for medical advice about side effects. You may report side  effects to FDA at 1-800-FDA-1088. Where should I keep my medicine? This drug is given in a hospital or clinic and will not be stored at home. NOTE: This sheet is a summary. It may not cover all possible information. If you have questions about this medicine, talk to your doctor, pharmacist, or health care provider.  2013, [employer]. (01/10/2008 2:32:25 PM)

## 2013-06-16 ENCOUNTER — Telehealth: Payer: Self-pay | Admitting: Hematology & Oncology

## 2013-06-16 ENCOUNTER — Encounter: Payer: Self-pay | Admitting: Hematology & Oncology

## 2013-06-16 LAB — ERYTHROPOIETIN: Erythropoietin: 34.9 m[IU]/mL — ABNORMAL HIGH (ref 2.6–18.5)

## 2013-06-16 NOTE — Telephone Encounter (Signed)
Left pt message to call for details of 9-3 appointment

## 2013-06-16 NOTE — Progress Notes (Signed)
CC:   Jonita Albee, M.D.  DIAGNOSES: 1. IgA kappa myeloma. 2. Stage IIB (T2 N1 M0) infiltrating ductal carcinoma of the right     breast-HER2 positive/ER negative. 3. Anemia secondary to chemotherapy/renal insufficiency.  CURRENT THERAPY: 1. Velcade/Cytoxan q.week (3 weeks on/1 week off). 2. Zometa 4 mg IV q.3 weeks. 3. Decadron 40 mg IV q.month. 4. Herceptin/Perjeta q.3 week dosing. 5. Aranesp 300 mcg subcu q.3 weeks for hemoglobin less than 10.  INTERIM HISTORY:  Jacqueline Rivas comes in for followup.  She is doing fairly well.  She is losing some weight.  She says she wants to lose some weight.  When we check her myeloma studies, they have been holding relatively stable.  We just started her on the Cytoxan/Velcade combination recently.  This is because her monoclonal spike was going up.  This would be the 1st day that we can recheck her levels.  She has had no problems with treatment.  It does make her feel a little bit tired.  She has had no problems with diarrhea.  She says she does feel a little bit on the fatigued side.  She has had some progressive anemia.  I believe this is from her chemotherapy.  As such, I am going to get her on Aranesp.  I think this will help her.  She has done well with the herceptin/Perjeta.  I do not see any issues with respect to her breast cancer.  PHYSICAL EXAMINATION:  General:  This is an elderly white female in no obvious distress.  Vital signs:  Temperature of 98.4, pulse 89, respiratory rate 16, blood pressure 136/60.  Weight is 137.  Head and neck:  Normocephalic, atraumatic skull.  She has no ocular or oral lesions.  There is no mucositis.  There is no adenopathy in the neck. Lungs:  Clear to percussion and auscultation bilaterally.  Cardiac: Regular rate and rhythm with a normal S1, S2.  There are no murmurs, rubs or bruits.  Abdomen:  Soft.  She has good bowel sounds.  There is no fluid wave.  There is no palpable  hepatosplenomegaly.  Breasts: Shows left breast with no masses, edema or erythema.  There is no left axillary adenopathy.  Right chest wall shows well-healed mastectomy. There is no right chest wall nodules.  There is no right axillary adenopathy.  Back:  Shows some slight kyphosis.  Extremities:  Show some trace edema in her lower legs.  LABORATORY STUDIES:  White cell count is 6, hemoglobin 9, hematocrit 27.4, platelet count 280.  Sodium 130, potassium 3.2, BUN 15, creatinine 1.15.  IMPRESSION:  Jacqueline Rivas is a very charming, 79 year old white female with both IgA kappa myeloma, and stage IIB ductal carcinoma of the right breast.  As far as her breast cancer is concerned, we are treating this with Herceptin and Perjeta.  She is really not a good candidate for aggressive chemotherapy.  She has 2 positive lymph nodes.  Again, I feel that the Perjeta and Herceptin will be a reasonable choice for her.  I think we are going to have to do another echocardiogram on her.  Her last one was done back in May.  As far as the myeloma is concerned, we need to see how she responds to Cytoxan/Velcade.  We will try to get her Revlimid but we cannot because of money issues.  We will go ahead and plan to get her back to see [place] on 09/25.  This would be the start of  her next 3 week cycle.    ______________________________ Josph Macho, M.D. PRE/MEDQ  D:  06/15/2013  T:  06/16/2013  Job:  [id number]

## 2013-06-16 NOTE — Telephone Encounter (Signed)
Pt aware of 9-3 echo and 9-4 tx

## 2013-06-20 LAB — KAPPA/LAMBDA LIGHT CHAINS
Kappa free light chain: 129 mg/dL — ABNORMAL HIGH (ref 0.33–1.94)
Lambda Free Lght Chn: 0.17 mg/dL — ABNORMAL LOW (ref 0.57–2.63)

## 2013-06-20 LAB — PROTEIN ELECTROPHORESIS, SERUM, WITH REFLEX
Beta 2: 3.2 % (ref 3.2–6.5)
Beta Globulin: 5.2 % (ref 4.7–7.2)
Gamma Globulin: 35.4 % — ABNORMAL HIGH (ref 11.1–18.8)

## 2013-06-20 LAB — IGG, IGA, IGM
IgA: 3920 mg/dL — ABNORMAL HIGH (ref 69–380)
IgG (Immunoglobin G), Serum: 203 mg/dL — ABNORMAL LOW (ref 690–1700)
IgM, Serum: 18 mg/dL — ABNORMAL LOW (ref 52–322)

## 2013-06-21 ENCOUNTER — Ambulatory Visit (HOSPITAL_COMMUNITY)
Admission: RE | Admit: 2013-06-21 | Discharge: 2013-06-21 | Disposition: A | Discharge status: Home / Self Care | Payer: Medicare Other | Source: Ambulatory Visit | Attending: Hematology & Oncology | Admitting: Hematology & Oncology

## 2013-06-21 DIAGNOSIS — I079 Rheumatic tricuspid valve disease, unspecified: Secondary | ICD-10-CM | POA: Insufficient documentation

## 2013-06-21 DIAGNOSIS — I998 Other disorder of circulatory system: Secondary | ICD-10-CM | POA: Insufficient documentation

## 2013-06-21 DIAGNOSIS — I059 Rheumatic mitral valve disease, unspecified: Secondary | ICD-10-CM | POA: Diagnosis not present

## 2013-06-21 DIAGNOSIS — C9000 Multiple myeloma not having achieved remission: Secondary | ICD-10-CM | POA: Insufficient documentation

## 2013-06-21 DIAGNOSIS — C50919 Malignant neoplasm of unspecified site of unspecified female breast: Secondary | ICD-10-CM | POA: Diagnosis not present

## 2013-06-21 NOTE — Progress Notes (Signed)
Echo Lab  2D Echocardiogram completed.  Kolbe Delmonaco L Gearldene Fiorenza, RDCS 06/21/2013 10:37 AM

## 2013-06-22 ENCOUNTER — Ambulatory Visit (HOSPITAL_BASED_OUTPATIENT_CLINIC_OR_DEPARTMENT_OTHER): Payer: Medicare Other

## 2013-06-22 ENCOUNTER — Other Ambulatory Visit (HOSPITAL_BASED_OUTPATIENT_CLINIC_OR_DEPARTMENT_OTHER): Payer: Medicare Other | Admitting: Lab

## 2013-06-22 DIAGNOSIS — Z5111 Encounter for antineoplastic chemotherapy: Secondary | ICD-10-CM

## 2013-06-22 DIAGNOSIS — C50919 Malignant neoplasm of unspecified site of unspecified female breast: Secondary | ICD-10-CM | POA: Diagnosis not present

## 2013-06-22 DIAGNOSIS — C9000 Multiple myeloma not having achieved remission: Secondary | ICD-10-CM | POA: Diagnosis not present

## 2013-06-22 DIAGNOSIS — Z5112 Encounter for antineoplastic immunotherapy: Secondary | ICD-10-CM

## 2013-06-22 DIAGNOSIS — D631 Anemia in chronic kidney disease: Secondary | ICD-10-CM

## 2013-06-22 LAB — CBC WITH DIFFERENTIAL (CANCER CENTER ONLY)
BASO#: 0.1 10*3/uL (ref 0.0–0.2)
BASO%: 1 % (ref 0.0–2.0)
HCT: 28.6 % — ABNORMAL LOW (ref 34.8–46.6)
HGB: 9.3 g/dL — ABNORMAL LOW (ref 11.6–15.9)
LYMPH#: 0.7 10*3/uL — ABNORMAL LOW (ref 0.9–3.3)
MONO#: 0.9 10*3/uL (ref 0.1–0.9)
NEUT#: 4 10*3/uL (ref 1.5–6.5)
NEUT%: 70.4 % (ref 39.6–80.0)
RDW: 16.4 % — ABNORMAL HIGH (ref 11.1–15.7)
WBC: 5.7 10*3/uL (ref 3.9–10.0)

## 2013-06-22 LAB — BASIC METABOLIC PANEL - CANCER CENTER ONLY
CO2: 29 mEq/L (ref 18–33)
Chloride: 94 mEq/L — ABNORMAL LOW (ref 98–108)
Creat: 1.4 mg/dl — ABNORMAL HIGH (ref 0.6–1.2)
Potassium: 3.3 mEq/L (ref 3.3–4.7)

## 2013-06-22 MED ORDER — BORTEZOMIB CHEMO SQ INJECTION 3.5 MG (2.5MG/ML)
1.3000 mg/m2 | Freq: Once | INTRAMUSCULAR | Status: AC
  Administered 2013-06-22: 2.25 mg via SUBCUTANEOUS
  Filled 2013-06-22: qty 2.25

## 2013-06-22 MED ORDER — SODIUM CHLORIDE 0.9 % IV SOLN
Freq: Once | INTRAVENOUS | Status: AC
  Administered 2013-06-22: 11:00:00 via INTRAVENOUS

## 2013-06-22 MED ORDER — CYCLOPHOSPHAMIDE CHEMO INJECTION 1 GM
250.0000 mg/m2 | Freq: Once | INTRAMUSCULAR | Status: AC
  Administered 2013-06-22: 440 mg via INTRAVENOUS
  Filled 2013-06-22: qty 22

## 2013-06-22 MED ORDER — ONDANSETRON 16 MG/50ML IVPB (CHCC)
16.0000 mg | Freq: Once | INTRAVENOUS | Status: AC
  Administered 2013-06-22: 16 mg via INTRAVENOUS

## 2013-06-22 MED ORDER — DEXAMETHASONE SODIUM PHOSPHATE 20 MG/5ML IJ SOLN
20.0000 mg | Freq: Once | INTRAMUSCULAR | Status: AC
  Administered 2013-06-22: 20 mg via INTRAVENOUS
  Filled 2013-06-22: qty 5

## 2013-06-22 NOTE — Patient Instructions (Addendum)
[facility] Discharge Instructions for Patients Receiving Chemotherapy  Today you received the following chemotherapy agents: Velcade, Cytoxan.  To help prevent nausea and vomiting after your treatment, we encourage you to take your nausea medication.  If you develop nausea and vomiting that is not controlled by your nausea medication, call the clinic.   BELOW ARE SYMPTOMS THAT SHOULD BE REPORTED IMMEDIATELY:  *FEVER GREATER THAN 100.5 F  *CHILLS WITH OR WITHOUT FEVER  NAUSEA AND VOMITING THAT IS NOT CONTROLLED WITH YOUR NAUSEA MEDICATION  *UNUSUAL SHORTNESS OF BREATH  *UNUSUAL BRUISING OR BLEEDING  TENDERNESS IN MOUTH AND THROAT WITH OR WITHOUT PRESENCE OF ULCERS  *URINARY PROBLEMS  *BOWEL PROBLEMS  UNUSUAL RASH Items with * indicate a potential emergency and should be followed up as soon as possible.  Feel free to call the clinic you have any questions or concerns. The clinic phone number is [phone].    

## 2013-06-29 ENCOUNTER — Ambulatory Visit: Payer: Medicare Other

## 2013-06-29 ENCOUNTER — Ambulatory Visit (HOSPITAL_BASED_OUTPATIENT_CLINIC_OR_DEPARTMENT_OTHER): Payer: Medicare Other

## 2013-06-29 ENCOUNTER — Other Ambulatory Visit: Payer: Medicare Other | Admitting: Lab

## 2013-06-29 ENCOUNTER — Other Ambulatory Visit (HOSPITAL_BASED_OUTPATIENT_CLINIC_OR_DEPARTMENT_OTHER): Payer: Medicare Other | Admitting: Lab

## 2013-06-29 VITALS — BP 140/85 | HR 85 | Temp 99.4°F

## 2013-06-29 DIAGNOSIS — C50919 Malignant neoplasm of unspecified site of unspecified female breast: Secondary | ICD-10-CM

## 2013-06-29 DIAGNOSIS — C773 Secondary and unspecified malignant neoplasm of axilla and upper limb lymph nodes: Secondary | ICD-10-CM | POA: Diagnosis not present

## 2013-06-29 DIAGNOSIS — D631 Anemia in chronic kidney disease: Secondary | ICD-10-CM

## 2013-06-29 DIAGNOSIS — Z5112 Encounter for antineoplastic immunotherapy: Secondary | ICD-10-CM | POA: Diagnosis not present

## 2013-06-29 DIAGNOSIS — C9000 Multiple myeloma not having achieved remission: Secondary | ICD-10-CM | POA: Diagnosis not present

## 2013-06-29 DIAGNOSIS — Z5111 Encounter for antineoplastic chemotherapy: Secondary | ICD-10-CM

## 2013-06-29 LAB — CBC WITH DIFFERENTIAL (CANCER CENTER ONLY)
BASO#: 0.1 10*3/uL (ref 0.0–0.2)
EOS%: 0.8 % (ref 0.0–7.0)
Eosinophils Absolute: 0.1 10*3/uL (ref 0.0–0.5)
HGB: 10.1 g/dL — ABNORMAL LOW (ref 11.6–15.9)
LYMPH%: 10.8 % — ABNORMAL LOW (ref 14.0–48.0)
MCH: 33.4 pg (ref 26.0–34.0)
MCHC: 32.6 g/dL (ref 32.0–36.0)
MCV: 103 fL — ABNORMAL HIGH (ref 81–101)
MONO%: 12.5 % (ref 0.0–13.0)
NEUT#: 4.7 10*3/uL (ref 1.5–6.5)
NEUT%: 74.8 % (ref 39.6–80.0)
RBC: 3.02 10*6/uL — ABNORMAL LOW (ref 3.70–5.32)

## 2013-06-29 LAB — BASIC METABOLIC PANEL - CANCER CENTER ONLY
BUN, Bld: 16 mg/dL (ref 7–22)
Calcium: 9.8 mg/dL (ref 8.0–10.3)
Creat: 1.4 mg/dl — ABNORMAL HIGH (ref 0.6–1.2)
Glucose, Bld: 107 mg/dL (ref 73–118)
Potassium: 3.6 mEq/L (ref 3.3–4.7)

## 2013-06-29 MED ORDER — BORTEZOMIB CHEMO SQ INJECTION 3.5 MG (2.5MG/ML)
1.3000 mg/m2 | Freq: Once | INTRAMUSCULAR | Status: AC
  Administered 2013-06-29: 2.25 mg via SUBCUTANEOUS
  Filled 2013-06-29: qty 2.25

## 2013-06-29 MED ORDER — SODIUM CHLORIDE 0.9 % IV SOLN
Freq: Once | INTRAVENOUS | Status: AC
  Administered 2013-06-29: 13:00:00 via INTRAVENOUS

## 2013-06-29 MED ORDER — SODIUM CHLORIDE 0.9 % IV SOLN
250.0000 mg/m2 | Freq: Once | INTRAVENOUS | Status: AC
  Administered 2013-06-29: 440 mg via INTRAVENOUS
  Filled 2013-06-29: qty 22

## 2013-06-29 MED ORDER — ONDANSETRON 16 MG/50ML IVPB (CHCC)
16.0000 mg | Freq: Once | INTRAVENOUS | Status: AC
  Administered 2013-06-29: 16 mg via INTRAVENOUS

## 2013-06-29 MED ORDER — DEXAMETHASONE SODIUM PHOSPHATE 20 MG/5ML IJ SOLN
20.0000 mg | Freq: Once | INTRAMUSCULAR | Status: AC
  Administered 2013-06-29: 20 mg via INTRAVENOUS
  Filled 2013-06-29: qty 5

## 2013-06-29 NOTE — Patient Instructions (Addendum)
Bortezomib injection What is this medicine? BORTEZOMIB (bor TEZ oh mib) is a chemotherapy drug. It slows the growth of cancer cells. This medicine is used to treat multiple myeloma, lymphoma, and other cancers. This medicine may be used for other purposes; ask your health care provider or pharmacist if you have questions. What should I tell my health care provider before I take this medicine? They need to know if you have any of these conditions: -heart disease -irregular heartbeat -liver disease -low blood counts, like low white blood cells, platelets, or hemoglobin -peripheral neuropathy -taking medicine for blood pressure -an unusual or allergic reaction to bortezomib, mannitol, boron, other medicines, foods, dyes, or preservatives -pregnant or trying to get pregnant -breast-feeding How should I use this medicine? This medicine is for injection into a vein or for injection under the skin. It is given by a health care professional in a hospital or clinic setting. Talk to your pediatrician regarding the use of this medicine in children. Special care may be needed. Overdosage: If you think you have taken too much of this medicine contact a poison control center or emergency room at once. NOTE: This medicine is only for you. Do not share this medicine with others. What if I miss a dose? It is important not to miss your dose. Call your doctor or health care professional if you are unable to keep an appointment. What may interact with this medicine? -medicines for diabetes -medicines to increase blood counts like filgrastim, pegfilgrastim, sargramostim -zalcitabine Talk to your doctor or health care professional before taking any of these medicines: -acetaminophen -aspirin -ibuprofen -ketoprofen -naproxen This list may not describe all possible interactions. Give your health care provider a list of all the medicines, herbs, non-prescription drugs, or dietary supplements you use. Also  tell them if you smoke, drink alcohol, or use illegal drugs. Some items may interact with your medicine. What should I watch for while using this medicine? Visit your doctor for checks on your progress. This drug may make you feel generally unwell. This is not uncommon, as chemotherapy can affect healthy cells as well as cancer cells. Report any side effects. Continue your course of treatment even though you feel ill unless your doctor tells you to stop. You may get drowsy or dizzy. Do not drive, use machinery, or do anything that needs mental alertness until you know how this medicine affects you. Do not stand or sit up quickly, especially if you are an older patient. This reduces the risk of dizzy or fainting spells. In some cases, you may be given additional medicines to help with side effects. Follow all directions for their use. Call your doctor or health care professional for advice if you get a fever, chills or sore throat, or other symptoms of a cold or flu. Do not treat yourself. This drug decreases your body's ability to fight infections. Try to avoid being around people who are sick. This medicine may increase your risk to bruise or bleed. Call your doctor or health care professional if you notice any unusual bleeding. Be careful brushing and flossing your teeth or using a toothpick because you may get an infection or bleed more easily. If you have any dental work done, tell your dentist you are receiving this medicine. Avoid taking products that contain aspirin, acetaminophen, ibuprofen, naproxen, or ketoprofen unless instructed by your doctor. These medicines may hide a fever. Do not become pregnant while taking this medicine. Women should inform their doctor if they wish to   become pregnant or think they might be pregnant. There is a potential for serious side effects to an unborn child. Talk to your health care professional or pharmacist for more information. Do not breast-feed an infant while  taking this medicine. You may have vomiting or diarrhea while taking this medicine. Drink water or other fluids as directed. What side effects may I notice from receiving this medicine? Side effects that you should report to your doctor or health care professional as soon as possible: -allergic reactions like skin rash, itching or hives, swelling of the face, lips, or tongue -breathing problems -changes in hearing -changes in vision -fast, irregular heartbeat -feeling faint or lightheaded, falls -pain, tingling, numbness in the hands or feet -seizures -swelling of the ankles, feet, hands -unusual bleeding or bruising -unusually weak or tired -vomiting Side effects that usually do not require medical attention (report to your doctor or health care professional if they continue or are bothersome): -changes in emotions or moods -constipation -diarrhea -loss of appetite -headache -irritation at site where injected -nausea This list may not describe all possible side effects. Call your doctor for medical advice about side effects. You may report side effects to FDA at 1-800-FDA-1088. Where should I keep my medicine? This drug is given in a hospital or clinic and will not be stored at home. NOTE: This sheet is a summary. It may not cover all possible information. If you have questions about this medicine, talk to your doctor, pharmacist, or health care provider.  2013, [employer] Standard. (11/12/2010 11:42:36 AM)   Cyclophosphamide injection What is this medicine? CYCLOPHOSPHAMIDE (sye kloe FOSS fa mide) is a chemotherapy drug. It slows the growth of cancer cells. This medicine is used to treat many types of cancer like lymphoma, myeloma, leukemia, breast cancer, and ovarian cancer, to name a few. It is also used to treat nephrotic syndrome in children. This medicine may be used for other purposes; ask your health care provider or pharmacist if you have questions. What should I tell  my health care provider before I take this medicine? They need to know if you have any of these conditions: -blood disorders -history of other chemotherapy -history of radiation therapy -infection -kidney disease -liver disease -tumors in the bone marrow -an unusual or allergic reaction to cyclophosphamide, other chemotherapy, other medicines, foods, dyes, or preservatives -pregnant or trying to get pregnant -breast-feeding How should I use this medicine? This drug is usually given as an injection into a vein or muscle or by infusion into a vein. It is administered in a hospital or clinic by a specially trained health care professional. Talk to your pediatrician regarding the use of this medicine in children. While this drug may be prescribed for selected conditions, precautions do apply. Overdosage: If you think you have taken too much of this medicine contact a poison control center or emergency room at once. NOTE: This medicine is only for you. Do not share this medicine with others. What if I miss a dose? It is important not to miss your dose. Call your doctor or health care professional if you are unable to keep an appointment. What may interact with this medicine? Do not take this medicine with any of the following medications: -mibefradil -nalidixic acid This medicine may also interact with the following medications: -doxorubicin -etanercept -medicines to increase blood counts like filgrastim, pegfilgrastim, sargramostim -medicines that block muscle or nerve pain -St. John's Wort -phenobarbital -succinylcholine chloride -trastuzumab -vaccines Talk to your doctor or health care   professional before taking any of these medicines: -acetaminophen -aspirin -ibuprofen -ketoprofen -naproxen This list may not describe all possible interactions. Give your health care provider a list of all the medicines, herbs, non-prescription drugs, or dietary supplements you use. Also tell them  if you smoke, drink alcohol, or use illegal drugs. Some items may interact with your medicine. What should I watch for while using this medicine? Visit your doctor for checks on your progress. This drug may make you feel generally unwell. This is not uncommon, as chemotherapy can affect healthy cells as well as cancer cells. Report any side effects. Continue your course of treatment even though you feel ill unless your doctor tells you to stop. Drink water or other fluids as directed. Urinate often, even at night. In some cases, you may be given additional medicines to help with side effects. Follow all directions for their use. Call your doctor or health care professional for advice if you get a fever, chills or sore throat, or other symptoms of a cold or flu. Do not treat yourself. This drug decreases your body's ability to fight infections. Try to avoid being around people who are sick. This medicine may increase your risk to bruise or bleed. Call your doctor or health care professional if you notice any unusual bleeding. Be careful brushing and flossing your teeth or using a toothpick because you may get an infection or bleed more easily. If you have any dental work done, tell your dentist you are receiving this medicine. Avoid taking products that contain aspirin, acetaminophen, ibuprofen, naproxen, or ketoprofen unless instructed by your doctor. These medicines may hide a fever. Do not become pregnant while taking this medicine. Women should inform their doctor if they wish to become pregnant or think they might be pregnant. There is a potential for serious side effects to an unborn child. Talk to your health care professional or pharmacist for more information. Do not breast-feed an infant while taking this medicine. Men should inform their doctor if they wish to father a child. This medicine may lower sperm counts. If you are going to have surgery, tell your doctor or health care professional that  you have taken this medicine. What side effects may I notice from receiving this medicine? Side effects that you should report to your doctor or health care professional as soon as possible: -allergic reactions like skin rash, itching or hives, swelling of the face, lips, or tongue -low blood counts - this medicine may decrease the number of white blood cells, red blood cells and platelets. You may be at increased risk for infections and bleeding. -signs of infection - fever or chills, cough, sore throat, pain or difficulty passing urine -signs of decreased platelets or bleeding - bruising, pinpoint red spots on the skin, black, tarry stools, blood in the urine -signs of decreased red blood cells - unusually weak or tired, fainting spells, lightheadedness -breathing problems -dark urine -mouth sores -pain, swelling, redness at site where injected -swelling of the ankles, feet, hands -trouble passing urine or change in the amount of urine -weight gain -yellowing of the eyes or skin Side effects that usually do not require medical attention (report to your doctor or health care professional if they continue or are bothersome): -changes in nail or skin color -diarrhea -hair loss -loss of appetite -missed menstrual periods -nausea, vomiting -stomach pain This list may not describe all possible side effects. Call your doctor for medical advice about side effects. You may report side effects   to FDA at 1-800-FDA-1088. Where should I keep my medicine? This drug is given in a hospital or clinic and will not be stored at home. NOTE: This sheet is a summary. It may not cover all possible information. If you have questions about this medicine, talk to your doctor, pharmacist, or health care provider.  2013, [employer]/Gold Standard. (01/10/2008 2:32:25 PM)  

## 2013-07-06 ENCOUNTER — Other Ambulatory Visit: Payer: Medicare Other | Admitting: Lab

## 2013-07-06 ENCOUNTER — Ambulatory Visit (HOSPITAL_BASED_OUTPATIENT_CLINIC_OR_DEPARTMENT_OTHER): Payer: Medicare Other | Admitting: Lab

## 2013-07-06 ENCOUNTER — Ambulatory Visit (HOSPITAL_BASED_OUTPATIENT_CLINIC_OR_DEPARTMENT_OTHER): Payer: Medicare Other

## 2013-07-06 ENCOUNTER — Ambulatory Visit: Payer: Medicare Other | Admitting: Hematology & Oncology

## 2013-07-06 ENCOUNTER — Ambulatory Visit: Payer: Medicare Other

## 2013-07-06 VITALS — BP 128/78 | HR 84 | Temp 97.4°F

## 2013-07-06 DIAGNOSIS — C50919 Malignant neoplasm of unspecified site of unspecified female breast: Secondary | ICD-10-CM

## 2013-07-06 DIAGNOSIS — Z5111 Encounter for antineoplastic chemotherapy: Secondary | ICD-10-CM | POA: Diagnosis not present

## 2013-07-06 DIAGNOSIS — Z5112 Encounter for antineoplastic immunotherapy: Secondary | ICD-10-CM

## 2013-07-06 DIAGNOSIS — C9000 Multiple myeloma not having achieved remission: Secondary | ICD-10-CM

## 2013-07-06 DIAGNOSIS — C50911 Malignant neoplasm of unspecified site of right female breast: Secondary | ICD-10-CM

## 2013-07-06 LAB — BASIC METABOLIC PANEL
CO2: 23 mEq/L (ref 19–32)
Calcium: 9.7 mg/dL (ref 8.4–10.5)
Glucose, Bld: 97 mg/dL (ref 70–99)
Potassium: 3.7 mEq/L (ref 3.5–5.3)
Sodium: 133 mEq/L — ABNORMAL LOW (ref 135–145)

## 2013-07-06 LAB — CBC WITH DIFFERENTIAL (CANCER CENTER ONLY)
BASO%: 1 % (ref 0.0–2.0)
Eosinophils Absolute: 0 10*3/uL (ref 0.0–0.5)
MONO#: 0.7 10*3/uL (ref 0.1–0.9)
NEUT#: 3.8 10*3/uL (ref 1.5–6.5)
Platelets: 212 10*3/uL (ref 145–400)
RBC: 3.01 10*6/uL — ABNORMAL LOW (ref 3.70–5.32)
RDW: 16.4 % — ABNORMAL HIGH (ref 11.1–15.7)
WBC: 5.1 10*3/uL (ref 3.9–10.0)

## 2013-07-06 MED ORDER — SODIUM CHLORIDE 0.9 % IV SOLN
Freq: Once | INTRAVENOUS | Status: AC
  Administered 2013-07-06: 11:00:00 via INTRAVENOUS

## 2013-07-06 MED ORDER — SODIUM CHLORIDE 0.9 % IV SOLN
420.0000 mg | Freq: Once | INTRAVENOUS | Status: AC
  Administered 2013-07-06: 420 mg via INTRAVENOUS
  Filled 2013-07-06: qty 14

## 2013-07-06 MED ORDER — ONDANSETRON 16 MG/50ML IVPB (CHCC)
16.0000 mg | Freq: Once | INTRAVENOUS | Status: AC
  Administered 2013-07-06: 16 mg via INTRAVENOUS

## 2013-07-06 MED ORDER — BORTEZOMIB CHEMO SQ INJECTION 3.5 MG (2.5MG/ML)
1.3000 mg/m2 | Freq: Once | INTRAMUSCULAR | Status: AC
  Administered 2013-07-06: 2.25 mg via SUBCUTANEOUS
  Filled 2013-07-06: qty 2.25

## 2013-07-06 MED ORDER — ACETAMINOPHEN 325 MG PO TABS
650.0000 mg | ORAL_TABLET | Freq: Once | ORAL | Status: AC
  Administered 2013-07-06: 650 mg via ORAL

## 2013-07-06 MED ORDER — SODIUM CHLORIDE 0.9 % IV SOLN
250.0000 mg/m2 | Freq: Once | INTRAVENOUS | Status: AC
  Administered 2013-07-06: 440 mg via INTRAVENOUS
  Filled 2013-07-06: qty 22

## 2013-07-06 MED ORDER — DEXAMETHASONE SODIUM PHOSPHATE 20 MG/5ML IJ SOLN
20.0000 mg | Freq: Once | INTRAMUSCULAR | Status: AC
  Administered 2013-07-06: 20 mg via INTRAVENOUS
  Filled 2013-07-06: qty 5

## 2013-07-06 MED ORDER — TRASTUZUMAB CHEMO INJECTION 440 MG
6.0000 mg/kg | Freq: Once | INTRAVENOUS | Status: AC
  Administered 2013-07-06: 420 mg via INTRAVENOUS
  Filled 2013-07-06: qty 20

## 2013-07-06 MED ORDER — DIPHENHYDRAMINE HCL 25 MG PO CAPS
50.0000 mg | ORAL_CAPSULE | Freq: Once | ORAL | Status: AC
  Administered 2013-07-06: 25 mg via ORAL

## 2013-07-06 NOTE — Patient Instructions (Addendum)
[facility] Discharge Instructions for Patients Receiving Chemotherapy  Today you received the following chemotherapy agents Velcade, Cytoxan, Perjeta, and Herceptin.  To help prevent nausea and vomiting after your treatment, we encourage you to take your nausea medication    If you develop nausea and vomiting that is not controlled by your nausea medication, call the clinic.   BELOW ARE SYMPTOMS THAT SHOULD BE REPORTED IMMEDIATELY:  *FEVER GREATER THAN 100.5 F  *CHILLS WITH OR WITHOUT FEVER  NAUSEA AND VOMITING THAT IS NOT CONTROLLED WITH YOUR NAUSEA MEDICATION  *UNUSUAL SHORTNESS OF BREATH  *UNUSUAL BRUISING OR BLEEDING  TENDERNESS IN MOUTH AND THROAT WITH OR WITHOUT PRESENCE OF ULCERS  *URINARY PROBLEMS  *BOWEL PROBLEMS  UNUSUAL RASH Items with * indicate a potential emergency and should be followed up as soon as possible.  Feel free to call the clinic you have any questions or concerns. The clinic phone number is [phone].

## 2013-07-12 ENCOUNTER — Ambulatory Visit (HOSPITAL_BASED_OUTPATIENT_CLINIC_OR_DEPARTMENT_OTHER): Payer: Medicare Other | Admitting: Hematology & Oncology

## 2013-07-12 ENCOUNTER — Ambulatory Visit (HOSPITAL_BASED_OUTPATIENT_CLINIC_OR_DEPARTMENT_OTHER): Payer: Medicare Other | Admitting: Lab

## 2013-07-12 ENCOUNTER — Ambulatory Visit (HOSPITAL_BASED_OUTPATIENT_CLINIC_OR_DEPARTMENT_OTHER): Payer: Medicare Other

## 2013-07-12 VITALS — BP 151/84 | HR 101 | Temp 98.0°F | Resp 14 | Ht 65.0 in | Wt 137.0 lb

## 2013-07-12 DIAGNOSIS — C50919 Malignant neoplasm of unspecified site of unspecified female breast: Secondary | ICD-10-CM | POA: Diagnosis not present

## 2013-07-12 DIAGNOSIS — Z5112 Encounter for antineoplastic immunotherapy: Secondary | ICD-10-CM | POA: Diagnosis not present

## 2013-07-12 DIAGNOSIS — Z5111 Encounter for antineoplastic chemotherapy: Secondary | ICD-10-CM

## 2013-07-12 DIAGNOSIS — C9000 Multiple myeloma not having achieved remission: Secondary | ICD-10-CM

## 2013-07-12 LAB — CMP (CANCER CENTER ONLY)
AST: 45 U/L — ABNORMAL HIGH (ref 11–38)
BUN, Bld: 14 mg/dL (ref 7–22)
CO2: 26 mEq/L (ref 18–33)
Calcium: 9.4 mg/dL (ref 8.0–10.3)
Chloride: 99 mEq/L (ref 98–108)
Creat: 1.1 mg/dl (ref 0.6–1.2)
Total Bilirubin: 0.9 mg/dl (ref 0.20–1.60)

## 2013-07-12 LAB — CBC WITH DIFFERENTIAL (CANCER CENTER ONLY)
BASO#: 0.1 10*3/uL (ref 0.0–0.2)
EOS%: 0.4 % (ref 0.0–7.0)
Eosinophils Absolute: 0 10*3/uL (ref 0.0–0.5)
HGB: 9.9 g/dL — ABNORMAL LOW (ref 11.6–15.9)
LYMPH#: 0.5 10*3/uL — ABNORMAL LOW (ref 0.9–3.3)
LYMPH%: 10.2 % — ABNORMAL LOW (ref 14.0–48.0)
MCV: 102 fL — ABNORMAL HIGH (ref 81–101)
MONO#: 0.6 10*3/uL (ref 0.1–0.9)
MONO%: 12.2 % (ref 0.0–13.0)
NEUT#: 3.6 10*3/uL (ref 1.5–6.5)
NEUT%: 75.7 % (ref 39.6–80.0)
Platelets: 205 10*3/uL (ref 145–400)
RBC: 2.91 10*6/uL — ABNORMAL LOW (ref 3.70–5.32)
WBC: 4.7 10*3/uL (ref 3.9–10.0)

## 2013-07-12 MED ORDER — SODIUM CHLORIDE 0.9 % IV SOLN
250.0000 mg/m2 | Freq: Once | INTRAVENOUS | Status: AC
  Administered 2013-07-12: 440 mg via INTRAVENOUS
  Filled 2013-07-12: qty 22

## 2013-07-12 MED ORDER — ZOLEDRONIC ACID 4 MG/5ML IV CONC
3.0000 mg | Freq: Once | INTRAVENOUS | Status: AC
Start: 2013-07-12 — End: 2013-07-12
  Administered 2013-07-12: 3 mg via INTRAVENOUS
  Filled 2013-07-12: qty 3.75

## 2013-07-12 MED ORDER — DEXAMETHASONE SODIUM PHOSPHATE 20 MG/5ML IJ SOLN
INTRAMUSCULAR | Status: AC
  Filled 2013-07-12: qty 5

## 2013-07-12 MED ORDER — ONDANSETRON 16 MG/50ML IVPB (CHCC)
16.0000 mg | Freq: Once | INTRAVENOUS | Status: AC
  Administered 2013-07-12: 16 mg via INTRAVENOUS

## 2013-07-12 MED ORDER — BORTEZOMIB CHEMO SQ INJECTION 3.5 MG (2.5MG/ML)
1.3000 mg/m2 | Freq: Once | INTRAMUSCULAR | Status: AC
  Administered 2013-07-12: 2.25 mg via SUBCUTANEOUS
  Filled 2013-07-12: qty 2.25

## 2013-07-12 MED ORDER — BUPRENORPHINE 10 MCG/HR TD PTWK: 10.0000 ug | MEDICATED_PATCH | TRANSDERMAL | Status: DC

## 2013-07-12 MED ORDER — DEXAMETHASONE SODIUM PHOSPHATE 20 MG/5ML IJ SOLN
20.0000 mg | Freq: Once | INTRAMUSCULAR | Status: AC
  Administered 2013-07-12: 20 mg via INTRAVENOUS

## 2013-07-12 MED ORDER — ONDANSETRON 16 MG/50ML IVPB (CHCC)
INTRAVENOUS | Status: AC
  Filled 2013-07-12: qty 16

## 2013-07-12 MED ORDER — SODIUM CHLORIDE 0.9 % IV SOLN
INTRAVENOUS | Status: DC
  Administered 2013-07-12: 13:00:00 via INTRAVENOUS

## 2013-07-12 NOTE — Patient Instructions (Signed)
Bortezomib injection What is this medicine? BORTEZOMIB (bor TEZ oh mib) is a chemotherapy drug. It slows the growth of cancer cells. This medicine is used to treat multiple myeloma, lymphoma, and other cancers. This medicine may be used for other purposes; ask your health care provider or pharmacist if you have questions. What should I tell my health care provider before I take this medicine? They need to know if you have any of these conditions: -heart disease -irregular heartbeat -liver disease -low blood counts, like low white blood cells, platelets, or hemoglobin -peripheral neuropathy -taking medicine for blood pressure -an unusual or allergic reaction to bortezomib, mannitol, boron, other medicines, foods, dyes, or preservatives -pregnant or trying to get pregnant -breast-feeding How should I use this medicine? This medicine is for injection into a vein or for injection under the skin. It is given by a health care professional in a hospital or clinic setting. Talk to your pediatrician regarding the use of this medicine in children. Special care may be needed. Overdosage: If you think you have taken too much of this medicine contact a poison control center or emergency room at once. NOTE: This medicine is only for you. Do not share this medicine with others. What if I miss a dose? It is important not to miss your dose. Call your doctor or health care professional if you are unable to keep an appointment. What may interact with this medicine? -medicines for diabetes -medicines to increase blood counts like filgrastim, pegfilgrastim, sargramostim -zalcitabine Talk to your doctor or health care professional before taking any of these medicines: -acetaminophen -aspirin -ibuprofen -ketoprofen -naproxen This list may not describe all possible interactions. Give your health care provider a list of all the medicines, herbs, non-prescription drugs, or dietary supplements you use. Also  tell them if you smoke, drink alcohol, or use illegal drugs. Some items may interact with your medicine. What should I watch for while using this medicine? Visit your doctor for checks on your progress. This drug may make you feel generally unwell. This is not uncommon, as chemotherapy can affect healthy cells as well as cancer cells. Report any side effects. Continue your course of treatment even though you feel ill unless your doctor tells you to stop. You may get drowsy or dizzy. Do not drive, use machinery, or do anything that needs mental alertness until you know how this medicine affects you. Do not stand or sit up quickly, especially if you are an older patient. This reduces the risk of dizzy or fainting spells. In some cases, you may be given additional medicines to help with side effects. Follow all directions for their use. Call your doctor or health care professional for advice if you get a fever, chills or sore throat, or other symptoms of a cold or flu. Do not treat yourself. This drug decreases your body's ability to fight infections. Try to avoid being around people who are sick. This medicine may increase your risk to bruise or bleed. Call your doctor or health care professional if you notice any unusual bleeding. Be careful brushing and flossing your teeth or using a toothpick because you may get an infection or bleed more easily. If you have any dental work done, tell your dentist you are receiving this medicine. Avoid taking products that contain aspirin, acetaminophen, ibuprofen, naproxen, or ketoprofen unless instructed by your doctor. These medicines may hide a fever. Do not become pregnant while taking this medicine. Women should inform their doctor if they wish to   become pregnant or think they might be pregnant. There is a potential for serious side effects to an unborn child. Talk to your health care professional or pharmacist for more information. Do not breast-feed an infant while  taking this medicine. You may have vomiting or diarrhea while taking this medicine. Drink water or other fluids as directed. What side effects may I notice from receiving this medicine? Side effects that you should report to your doctor or health care professional as soon as possible: -allergic reactions like skin rash, itching or hives, swelling of the face, lips, or tongue -breathing problems -changes in hearing -changes in vision -fast, irregular heartbeat -feeling faint or lightheaded, falls -pain, tingling, numbness in the hands or feet -seizures -swelling of the ankles, feet, hands -unusual bleeding or bruising -unusually weak or tired -vomiting Side effects that usually do not require medical attention (report to your doctor or health care professional if they continue or are bothersome): -changes in emotions or moods -constipation -diarrhea -loss of appetite -headache -irritation at site where injected -nausea This list may not describe all possible side effects. Call your doctor for medical advice about side effects. You may report side effects to FDA at 1-800-FDA-1088. Where should I keep my medicine? This drug is given in a hospital or clinic and will not be stored at home. NOTE: This sheet is a summary. It may not cover all possible information. If you have questions about this medicine, talk to your doctor, pharmacist, or health care provider.  2013, [employer] Standard. (11/12/2010 11:42:36 AM) Cyclophosphamide injection What is this medicine? CYCLOPHOSPHAMIDE (sye kloe FOSS fa mide) is a chemotherapy drug. It slows the growth of cancer cells. This medicine is used to treat many types of cancer like lymphoma, myeloma, leukemia, breast cancer, and ovarian cancer, to name a few. It is also used to treat nephrotic syndrome in children. This medicine may be used for other purposes; ask your health care provider or pharmacist if you have questions. What should I tell my  health care provider before I take this medicine? They need to know if you have any of these conditions: -blood disorders -history of other chemotherapy -history of radiation therapy -infection -kidney disease -liver disease -tumors in the bone marrow -an unusual or allergic reaction to cyclophosphamide, other chemotherapy, other medicines, foods, dyes, or preservatives -pregnant or trying to get pregnant -breast-feeding How should I use this medicine? This drug is usually given as an injection into a vein or muscle or by infusion into a vein. It is administered in a hospital or clinic by a specially trained health care professional. Talk to your pediatrician regarding the use of this medicine in children. While this drug may be prescribed for selected conditions, precautions do apply. Overdosage: If you think you have taken too much of this medicine contact a poison control center or emergency room at once. NOTE: This medicine is only for you. Do not share this medicine with others. What if I miss a dose? It is important not to miss your dose. Call your doctor or health care professional if you are unable to keep an appointment. What may interact with this medicine? Do not take this medicine with any of the following medications: -mibefradil -nalidixic acid This medicine may also interact with the following medications: -doxorubicin -etanercept -medicines to increase blood counts like filgrastim, pegfilgrastim, sargramostim -medicines that block muscle or nerve pain -St. John's Wort -phenobarbital -succinylcholine chloride -trastuzumab -vaccines Talk to your doctor or health care professional before  taking any of these medicines: -acetaminophen -aspirin -ibuprofen -ketoprofen -naproxen This list may not describe all possible interactions. Give your health care provider a list of all the medicines, herbs, non-prescription drugs, or dietary supplements you use. Also tell them if  you smoke, drink alcohol, or use illegal drugs. Some items may interact with your medicine. What should I watch for while using this medicine? Visit your doctor for checks on your progress. This drug may make you feel generally unwell. This is not uncommon, as chemotherapy can affect healthy cells as well as cancer cells. Report any side effects. Continue your course of treatment even though you feel ill unless your doctor tells you to stop. Drink water or other fluids as directed. Urinate often, even at night. In some cases, you may be given additional medicines to help with side effects. Follow all directions for their use. Call your doctor or health care professional for advice if you get a fever, chills or sore throat, or other symptoms of a cold or flu. Do not treat yourself. This drug decreases your body's ability to fight infections. Try to avoid being around people who are sick. This medicine may increase your risk to bruise or bleed. Call your doctor or health care professional if you notice any unusual bleeding. Be careful brushing and flossing your teeth or using a toothpick because you may get an infection or bleed more easily. If you have any dental work done, tell your dentist you are receiving this medicine. Avoid taking products that contain aspirin, acetaminophen, ibuprofen, naproxen, or ketoprofen unless instructed by your doctor. These medicines may hide a fever. Do not become pregnant while taking this medicine. Women should inform their doctor if they wish to become pregnant or think they might be pregnant. There is a potential for serious side effects to an unborn child. Talk to your health care professional or pharmacist for more information. Do not breast-feed an infant while taking this medicine. Men should inform their doctor if they wish to father a child. This medicine may lower sperm counts. If you are going to have surgery, tell your doctor or health care professional that  you have taken this medicine. What side effects may I notice from receiving this medicine? Side effects that you should report to your doctor or health care professional as soon as possible: -allergic reactions like skin rash, itching or hives, swelling of the face, lips, or tongue -low blood counts - this medicine may decrease the number of white blood cells, red blood cells and platelets. You may be at increased risk for infections and bleeding. -signs of infection - fever or chills, cough, sore throat, pain or difficulty passing urine -signs of decreased platelets or bleeding - bruising, pinpoint red spots on the skin, black, tarry stools, blood in the urine -signs of decreased red blood cells - unusually weak or tired, fainting spells, lightheadedness -breathing problems -dark urine -mouth sores -pain, swelling, redness at site where injected -swelling of the ankles, feet, hands -trouble passing urine or change in the amount of urine -weight gain -yellowing of the eyes or skin Side effects that usually do not require medical attention (report to your doctor or health care professional if they continue or are bothersome): -changes in nail or skin color -diarrhea -hair loss -loss of appetite -missed menstrual periods -nausea, vomiting -stomach pain This list may not describe all possible side effects. Call your doctor for medical advice about side effects. You may report side effects to FDA  at 1-800-FDA-1088. Where should I keep my medicine? This drug is given in a hospital or clinic and will not be stored at home. NOTE: This sheet is a summary. It may not cover all possible information. If you have questions about this medicine, talk to your doctor, pharmacist, or health care provider.  2013, [employer]/Gold Standard. (01/10/2008 2:32:25 PM)    [facility] Discharge Instructions for Patients Receiving Chemotherapy  Today you received the following chemotherapy agents  Velcade, Cytoxan  To help prevent nausea and vomiting after your treatment, we encourage you to take your nausea medication    If you develop nausea and vomiting that is not controlled by your nausea medication, call the clinic.   BELOW ARE SYMPTOMS THAT SHOULD BE REPORTED IMMEDIATELY:  *FEVER GREATER THAN 100.5 F  *CHILLS WITH OR WITHOUT FEVER  NAUSEA AND VOMITING THAT IS NOT CONTROLLED WITH YOUR NAUSEA MEDICATION  *UNUSUAL SHORTNESS OF BREATH  *UNUSUAL BRUISING OR BLEEDING  TENDERNESS IN MOUTH AND THROAT WITH OR WITHOUT PRESENCE OF ULCERS  *URINARY PROBLEMS  *BOWEL PROBLEMS  UNUSUAL RASH Items with * indicate a potential emergency and should be followed up as soon as possible.  Feel free to call the clinic you have any questions or concerns. The clinic phone number is [phone].

## 2013-07-13 ENCOUNTER — Other Ambulatory Visit: Payer: Medicare Other | Admitting: Lab

## 2013-07-13 ENCOUNTER — Ambulatory Visit: Payer: Medicare Other

## 2013-07-13 NOTE — Progress Notes (Signed)
This office note has been dictated.

## 2013-07-14 NOTE — Addendum Note (Signed)
Addended by: Arlan Organ R on: 07/14/2013 01:50 PM   Modules accepted: Orders, Medications

## 2013-07-14 NOTE — Progress Notes (Signed)
This office note has been dictated.

## 2013-07-17 LAB — PROTEIN ELECTROPHORESIS, SERUM, WITH REFLEX
Albumin ELP: 42.7 % — ABNORMAL LOW (ref 55.8–66.1)
Alpha-1-Globulin: 3.6 % (ref 2.9–4.9)
Beta 2: 36.7 % — ABNORMAL HIGH (ref 3.2–6.5)
Beta Globulin: 5.5 % (ref 4.7–7.2)
M-Spike, %: 2.63 g/dL
Total Protein, Serum Electrophoresis: 8.7 g/dL — ABNORMAL HIGH (ref 6.0–8.3)

## 2013-07-17 LAB — LACTATE DEHYDROGENASE: LDH: 119 U/L (ref 94–250)

## 2013-07-17 LAB — IGG, IGA, IGM
IgA: 3300 mg/dL — ABNORMAL HIGH (ref 69–380)
IgG (Immunoglobin G), Serum: 221 mg/dL — ABNORMAL LOW (ref 690–1700)
IgM, Serum: 18 mg/dL — ABNORMAL LOW (ref 52–322)

## 2013-07-17 LAB — KAPPA/LAMBDA LIGHT CHAINS: Lambda Free Lght Chn: 0.27 mg/dL — ABNORMAL LOW (ref 0.57–2.63)

## 2013-07-18 ENCOUNTER — Telehealth: Payer: Self-pay | Admitting: Oncology

## 2013-07-18 NOTE — Telephone Encounter (Addendum)
Message copied by Lacie Draft on Tue Jul 18, 2013  2:43 PM ------      Message from: Arlan Organ R      Created: Mon Jul 17, 2013  9:18 PM       Call - myeloma is a little better - FINALLY!!  PETE ------Left message on voicemail.

## 2013-07-18 NOTE — Progress Notes (Signed)
DIAGNOSES: 1. IgA kappa myeloma. 2. Stage IIB (T2 N1 M0) ductal carcinoma of the right breast (HER-2     positive/ER negative). 3. 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  CURRENT THERAPY: 1. Cytoxan/Velcade/Decadron q. week. 2. Zometa 4 mg IV q.3 weeks. 3. Herceptin/Perjeta q.3 week dosing. 4. Aranesp 300 mcg subcu q.3 weeks as needed for hemoglobin less than     10.  INTERIM HISTORY:  Ms. Larranaga comes in for her followup.  She actually looks a little bit better.  Her weight is holding stable.  She seems to be eating a little bit better.  I am happy about that.  She is still having some back issues.  She had compression fractures basically from myeloma.  She had kyphoplasty.  She was on Duragesic patch.  Unfortunately, for some reason, she has been using these every 5 or 6 days.  We will try her on a Butrans patch.  This is a 10 mcg patch put on weekly.  This may be a little bit easier for her.  She has had no nausea or vomiting.  She has had no problems with diarrhea.  There may be a little constipation.  She has had no bleeding.  Overall, her performance status is ECOG 2.  She has tolerated per Perjeta and Herceptin pretty well.  She was not a candidate for systemic chemotherapy for her breast cancer because of her performance status.  PHYSICAL EXAMINATION:  General:  This is an elderly-appearing white female in no obvious distress.  Vital Signs:  Show a temperature of 98, pulse 101, respiratory 14, blood pressure 151/84.  Weight is 137 pounds. Head and Neck:  Shows a normocephalic, atraumatic skull.  There are no ocular or oral lesions.  There are no palpable cervical or supraclavicular lymph nodes.  Lungs:  Clear bilaterally.  Cardiac: Regular rate and rhythm with a normal S1 and S2.  She has a 1/6 systolic ejection murmur.  Abdomen:  Soft.  She has decent bowel sounds.  There is no fluid wave.  There is no palpable hepatosplenomegaly.  Back:   No tenderness over the spine, ribs, or hips.  May be a little bit of kyphosis.  Extremities:  Show some age-related osteoarthritic changes. She has decent strength in her legs.  She has no tenderness over the long bones.  Skin:  Shows no rashes, ecchymosis, or petechia. Neurological:  Shows no focal neurological deficits.  LABORATORY STUDIES:  White cell count is 4.7, hemoglobin 9.9, hematocrit 29.8, platelet count is 205.  Her IgA level is 3300 mg/dL.  Kappa light chain was 90.5 mg/dL.  BUN is 14, creatinine 1.1.  IMPRESSION:  Ms. Nulty is a very nice 79 year old white female.  She has separate, but synchronous, malignancies.  She has the IgA kappa myeloma.  This been a little more resilient than I would have thought. Hopefully, we might be seeing a response now.  Will have to see what her monoclonal spike is.  Hopefully, I will get that back in a day or so.  We will continue on the Velcade/Cytoxan/Decadron.  I want her on Revlimid, which she could not afford, but I think this might be able to be given to her if we find our treatments are not accomplishing our goal of decreasing her monoclonal spike.  Her breast cancer is being treated with Herceptin/Perjeta.  One thing that we may have to think about is whether or not she is going to need radiation therapy.  She had a mastectomy.  She had 2, I think, positive lymph nodes.  I realize this might be a little controversial as to the benefit of radiation therapy in this situation.  I will hold off on Aranesp on her right now.  Her quality of life seems to be doing a little better right now.  We will go ahead and plan to get her back in another month.  She comes back weekly for her Velcade and Cytoxan.    ______________________________ Josph Macho, M.D. PRE/MEDQ  D:  07/14/2013  T:  07/18/2013  Job:  [id number]

## 2013-07-20 ENCOUNTER — Ambulatory Visit (HOSPITAL_BASED_OUTPATIENT_CLINIC_OR_DEPARTMENT_OTHER): Payer: Medicare Other

## 2013-07-20 ENCOUNTER — Other Ambulatory Visit (HOSPITAL_BASED_OUTPATIENT_CLINIC_OR_DEPARTMENT_OTHER): Payer: Medicare Other | Admitting: Lab

## 2013-07-20 VITALS — BP 134/77 | HR 99 | Temp 97.0°F

## 2013-07-20 DIAGNOSIS — C9000 Multiple myeloma not having achieved remission: Secondary | ICD-10-CM | POA: Diagnosis not present

## 2013-07-20 DIAGNOSIS — Z5112 Encounter for antineoplastic immunotherapy: Secondary | ICD-10-CM | POA: Diagnosis not present

## 2013-07-20 DIAGNOSIS — Z5111 Encounter for antineoplastic chemotherapy: Secondary | ICD-10-CM

## 2013-07-20 DIAGNOSIS — C8589 Other specified types of non-Hodgkin lymphoma, extranodal and solid organ sites: Secondary | ICD-10-CM | POA: Diagnosis not present

## 2013-07-20 LAB — CBC WITH DIFFERENTIAL (CANCER CENTER ONLY)
BASO#: 0.1 10*3/uL (ref 0.0–0.2)
BASO%: 1.6 % (ref 0.0–2.0)
EOS%: 0.4 % (ref 0.0–7.0)
HCT: 28.8 % — ABNORMAL LOW (ref 34.8–46.6)
HGB: 9.4 g/dL — ABNORMAL LOW (ref 11.6–15.9)
LYMPH%: 7 % — ABNORMAL LOW (ref 14.0–48.0)
MCH: 33.5 pg (ref 26.0–34.0)
MCHC: 32.6 g/dL (ref 32.0–36.0)
MCV: 103 fL — ABNORMAL HIGH (ref 81–101)
MONO%: 10.9 % (ref 0.0–13.0)
NEUT%: 80.1 % — ABNORMAL HIGH (ref 39.6–80.0)
RDW: 15.5 % (ref 11.1–15.7)

## 2013-07-20 LAB — BASIC METABOLIC PANEL - CANCER CENTER ONLY
BUN, Bld: 19 mg/dL (ref 7–22)
Calcium: 9.6 mg/dL (ref 8.0–10.3)
Creat: 1.6 mg/dl — ABNORMAL HIGH (ref 0.6–1.2)
Potassium: 4.2 mEq/L (ref 3.3–4.7)

## 2013-07-20 MED ORDER — ONDANSETRON 16 MG/50ML IVPB (CHCC)
INTRAVENOUS | Status: AC
  Filled 2013-07-20: qty 16

## 2013-07-20 MED ORDER — SODIUM CHLORIDE 0.9 % IV SOLN
250.0000 mg/m2 | Freq: Once | INTRAVENOUS | Status: AC
  Administered 2013-07-20: 440 mg via INTRAVENOUS
  Filled 2013-07-20: qty 22

## 2013-07-20 MED ORDER — BORTEZOMIB CHEMO SQ INJECTION 3.5 MG (2.5MG/ML)
1.3000 mg/m2 | Freq: Once | INTRAMUSCULAR | Status: AC
  Administered 2013-07-20: 2.25 mg via SUBCUTANEOUS
  Filled 2013-07-20: qty 2.25

## 2013-07-20 MED ORDER — DEXAMETHASONE SODIUM PHOSPHATE 20 MG/5ML IJ SOLN
20.0000 mg | Freq: Once | INTRAMUSCULAR | Status: AC
  Administered 2013-07-20: 20 mg via INTRAVENOUS

## 2013-07-20 MED ORDER — FENTANYL 12 MCG/HR TD PT72: 1.0000 | MEDICATED_PATCH | TRANSDERMAL | Status: DC

## 2013-07-20 MED ORDER — DEXAMETHASONE SODIUM PHOSPHATE 20 MG/5ML IJ SOLN
INTRAMUSCULAR | Status: AC
  Filled 2013-07-20: qty 5

## 2013-07-20 MED ORDER — ONDANSETRON 16 MG/50ML IVPB (CHCC)
16.0000 mg | Freq: Once | INTRAVENOUS | Status: AC
  Administered 2013-07-20: 16 mg via INTRAVENOUS

## 2013-07-20 NOTE — Patient Instructions (Addendum)
[facility] Discharge Instructions for Patients Receiving Chemotherapy  Today you received the following chemotherapy agents Cytoxan, Velcade  To help prevent nausea and vomiting after your treatment, we encourage you to take your nausea medication    If you develop nausea and vomiting that is not controlled by your nausea medication, call the clinic.   BELOW ARE SYMPTOMS THAT SHOULD BE REPORTED IMMEDIATELY:  *FEVER GREATER THAN 100.5 F  *CHILLS WITH OR WITHOUT FEVER  NAUSEA AND VOMITING THAT IS NOT CONTROLLED WITH YOUR NAUSEA MEDICATION  *UNUSUAL SHORTNESS OF BREATH  *UNUSUAL BRUISING OR BLEEDING  TENDERNESS IN MOUTH AND THROAT WITH OR WITHOUT PRESENCE OF ULCERS  *URINARY PROBLEMS  *BOWEL PROBLEMS  UNUSUAL RASH Items with * indicate a potential emergency and should be followed up as soon as possible.  Feel free to call the clinic you have any questions or concerns. The clinic phone number is [phone].

## 2013-07-27 ENCOUNTER — Ambulatory Visit (HOSPITAL_BASED_OUTPATIENT_CLINIC_OR_DEPARTMENT_OTHER): Payer: Medicare Other

## 2013-07-27 ENCOUNTER — Ambulatory Visit (HOSPITAL_BASED_OUTPATIENT_CLINIC_OR_DEPARTMENT_OTHER): Payer: Medicare Other | Admitting: Lab

## 2013-07-27 VITALS — BP 128/67 | HR 82 | Temp 98.0°F

## 2013-07-27 DIAGNOSIS — C50919 Malignant neoplasm of unspecified site of unspecified female breast: Secondary | ICD-10-CM

## 2013-07-27 DIAGNOSIS — Z5112 Encounter for antineoplastic immunotherapy: Secondary | ICD-10-CM

## 2013-07-27 DIAGNOSIS — C50911 Malignant neoplasm of unspecified site of right female breast: Secondary | ICD-10-CM

## 2013-07-27 DIAGNOSIS — D6481 Anemia due to antineoplastic chemotherapy: Secondary | ICD-10-CM | POA: Diagnosis not present

## 2013-07-27 DIAGNOSIS — C9000 Multiple myeloma not having achieved remission: Secondary | ICD-10-CM

## 2013-07-27 LAB — CBC WITH DIFFERENTIAL (CANCER CENTER ONLY)
BASO#: 0.1 10*3/uL (ref 0.0–0.2)
EOS%: 0.5 % (ref 0.0–7.0)
Eosinophils Absolute: 0 10*3/uL (ref 0.0–0.5)
HCT: 29.7 % — ABNORMAL LOW (ref 34.8–46.6)
HGB: 9.7 g/dL — ABNORMAL LOW (ref 11.6–15.9)
LYMPH%: 8.5 % — ABNORMAL LOW (ref 14.0–48.0)
MCH: 33.9 pg (ref 26.0–34.0)
MCHC: 32.7 g/dL (ref 32.0–36.0)
MCV: 104 fL — ABNORMAL HIGH (ref 81–101)
MONO#: 0.7 10*3/uL (ref 0.1–0.9)
MONO%: 11.4 % (ref 0.0–13.0)
Platelets: 221 10*3/uL (ref 145–400)
RBC: 2.86 10*6/uL — ABNORMAL LOW (ref 3.70–5.32)
WBC: 5.9 10*3/uL (ref 3.9–10.0)

## 2013-07-27 LAB — BASIC METABOLIC PANEL
BUN: 23 mg/dL (ref 6–23)
Chloride: 100 mEq/L (ref 96–112)
Potassium: 4.1 mEq/L (ref 3.5–5.3)
Sodium: 133 mEq/L — ABNORMAL LOW (ref 135–145)

## 2013-07-27 MED ORDER — DARBEPOETIN ALFA-POLYSORBATE 300 MCG/0.6ML IJ SOLN
300.0000 ug | Freq: Once | INTRAMUSCULAR | Status: AC
  Administered 2013-07-27: 300 ug via SUBCUTANEOUS

## 2013-07-27 MED ORDER — SODIUM CHLORIDE 0.9 % IV SOLN
Freq: Once | INTRAVENOUS | Status: AC
Start: 2013-07-27 — End: 2013-07-27
  Administered 2013-07-27: 11:00:00 via INTRAVENOUS

## 2013-07-27 MED ORDER — DEXAMETHASONE SODIUM PHOSPHATE 10 MG/ML IJ SOLN
INTRAMUSCULAR | Status: AC
  Filled 2013-07-27: qty 1

## 2013-07-27 MED ORDER — ACETAMINOPHEN 325 MG PO TABS
650.0000 mg | ORAL_TABLET | Freq: Once | ORAL | Status: AC
  Administered 2013-07-27: 650 mg via ORAL

## 2013-07-27 MED ORDER — DEXAMETHASONE SODIUM PHOSPHATE 20 MG/5ML IJ SOLN
40.0000 mg | Freq: Once | INTRAMUSCULAR | Status: AC
  Administered 2013-07-27: 40 mg via INTRAVENOUS

## 2013-07-27 MED ORDER — SODIUM CHLORIDE 0.9 % IV SOLN
420.0000 mg | Freq: Once | INTRAVENOUS | Status: AC
  Administered 2013-07-27: 420 mg via INTRAVENOUS
  Filled 2013-07-27: qty 14

## 2013-07-27 MED ORDER — ACETAMINOPHEN 325 MG PO TABS
ORAL_TABLET | ORAL | Status: AC
  Filled 2013-07-27: qty 2

## 2013-07-27 MED ORDER — DARBEPOETIN ALFA-POLYSORBATE 300 MCG/0.6ML IJ SOLN
INTRAMUSCULAR | Status: AC
  Filled 2013-07-27: qty 0.6

## 2013-07-27 MED ORDER — DIPHENHYDRAMINE HCL 25 MG PO CAPS
ORAL_CAPSULE | ORAL | Status: AC
  Filled 2013-07-27: qty 1

## 2013-07-27 MED ORDER — TRASTUZUMAB CHEMO INJECTION 440 MG
6.0000 mg/kg | Freq: Once | INTRAVENOUS | Status: AC
  Administered 2013-07-27: 378 mg via INTRAVENOUS
  Filled 2013-07-27: qty 18

## 2013-07-27 MED ORDER — ZOLEDRONIC ACID 4 MG/5ML IV CONC: 4.0000 mg | Freq: Once | INTRAVENOUS | Status: DC

## 2013-07-27 MED ORDER — DIPHENHYDRAMINE HCL 25 MG PO CAPS
50.0000 mg | ORAL_CAPSULE | Freq: Once | ORAL | Status: AC
  Administered 2013-07-27: 25 mg via ORAL

## 2013-07-27 NOTE — Patient Instructions (Signed)
Pertuzumab injection What is this medicine? PERTUZUMAB is a monoclonal antibody that targets a protein called HER2. HER2 is found in some breast cancers. This medicine can stop cancer cell growth. This medicine is used with other cancer treatments. This medicine may be used for other purposes; ask your health care provider or pharmacist if you have questions. What should I tell my health care provider before I take this medicine? They need to know if you have any of these conditions: -heart disease -heart failure -high blood pressure -history of irregular heart beat -recent or ongoing radiation therapy -an unusual or allergic reaction to pertuzumab, other medicines, foods, dyes, or preservatives -pregnant or trying to get pregnant -breast-feeding How should I use this medicine? This medicine is for infusion into a vein. It is given by a health care professional in a hospital or clinic setting. Talk to your pediatrician regarding the use of this medicine in children. Special care may be needed. Overdosage: If you think you've taken too much of this medicine contact a poison control center or emergency room at once. Overdosage: If you think you have taken too much of this medicine contact a poison control center or emergency room at once. NOTE: This medicine is only for you. Do not share this medicine with others. What if I miss a dose? It is important not to miss your dose. Call your doctor or health care professional if you are unable to keep an appointment. What may interact with this medicine? Interactions are not expected. Give your health care provider a list of all the medicines, herbs, non-prescription drugs, or dietary supplements you use. Also tell them if you smoke, drink alcohol, or use illegal drugs. Some items may interact with your medicine. This list may not describe all possible interactions. Give your health care provider a list of all the medicines, herbs, non-prescription  drugs, or dietary supplements you use. Also tell them if you smoke, drink alcohol, or use illegal drugs. Some items may interact with your medicine. What should I watch for while using this medicine? Your condition will be monitored carefully while you are receiving this medicine. Report any side effects. Continue your course of treatment even though you feel ill unless your doctor tells you to stop. Do not become pregnant while taking this medicine. Women should inform their doctor if they wish to become pregnant or think they might be pregnant. There is a potential for serious side effects to an unborn child. Talk to your health care professional or pharmacist for more information. Do not breast-feed an infant while taking this medicine. Call your doctor or health care professional for advice if you get a fever, chills or sore throat, or other symptoms of a cold or flu. Do not treat yourself. Try to avoid being around people who are sick. You may experience fever, chills, and headache during the infusion. Report any side effects during the infusion to your health care professional. What side effects may I notice from receiving this medicine? Side effects that you should report to your doctor or health care professional as soon as possible: -breathing problems -chest pain or palpitations -dizziness -feeling faint or lightheaded -fever or chills -skin rash, itching or hives -sore throat -swelling of the face, lips, or tongue -swelling of the legs or ankles -unusually weak or tired  Side effects that usually do not require medical attention (Report these to your doctor or health care professional if they continue or are bothersome.): -diarrhea -hair loss -nausea, vomiting -  tiredness This list may not describe all possible side effects. Call your doctor for medical advice about side effects. You may report side effects to FDA at 1-800-FDA-1088. Where should I keep my medicine? This drug is  given in a hospital or clinic and will not be stored at home. NOTE: This sheet is a summary. It may not cover all possible information. If you have questions about this medicine, talk to your doctor, pharmacist, or health care provider.  2013, [employer] Standard. (04/01/2011 4:45:56 PM) Trastuzumab injection for infusion What is this medicine? TRASTUZUMAB (tras TOO zoo mab) is a monoclonal antibody. It targets a protein called HER2. This protein is found in some stomach and breast cancers. This medicine can stop cancer cell growth. This medicine may be used with other cancer treatments. This medicine may be used for other purposes; ask your health care provider or pharmacist if you have questions. What should I tell my health care provider before I take this medicine? They need to know if you have any of these conditions: -heart disease -heart failure -infection (especially a virus infection such as chickenpox, cold sores, or herpes) -lung or breathing disease, like asthma -recent or ongoing radiation therapy -an unusual or allergic reaction to trastuzumab, benzyl alcohol, or other medications, foods, dyes, or preservatives -pregnant or trying to get pregnant -breast-feeding How should I use this medicine? This drug is given as an infusion into a vein. It is administered in a hospital or clinic by a specially trained health care professional. Talk to your pediatrician regarding the use of this medicine in children. This medicine is not approved for use in children. Overdosage: If you think you have taken too much of this medicine contact a poison control center or emergency room at once. NOTE: This medicine is only for you. Do not share this medicine with others. What if I miss a dose? It is important not to miss a dose. Call your doctor or health care professional if you are unable to keep an appointment. What may interact with this  medicine? -cyclophosphamide -doxorubicin -warfarin This list may not describe all possible interactions. Give your health care provider a list of all the medicines, herbs, non-prescription drugs, or dietary supplements you use. Also tell them if you smoke, drink alcohol, or use illegal drugs. Some items may interact with your medicine. What should I watch for while using this medicine? Visit your doctor for checks on your progress. Report any side effects. Continue your course of treatment even though you feel ill unless your doctor tells you to stop. Call your doctor or health care professional for advice if you get a fever, chills or sore throat, or other symptoms of a cold or flu. Do not treat yourself. Try to avoid being around people who are sick. You may experience fever, chills and shaking during your first infusion. These effects are usually mild and can be treated with other medicines. Report any side effects during the infusion to your health care professional. Fever and chills usually do not happen with later infusions. What side effects may I notice from receiving this medicine? Side effects that you should report to your doctor or other health care professional as soon as possible: -breathing difficulties -chest pain or palpitations -cough -dizziness or fainting -fever or chills, sore throat -skin rash, itching or hives -swelling of the legs or ankles -unusually weak or tired Side effects that usually do not require medical attention (report to your doctor or other health care professional  if they continue or are bothersome): -loss of appetite -headache -muscle aches -nausea This list may not describe all possible side effects. Call your doctor for medical advice about side effects. You may report side effects to FDA at 1-800-FDA-1088. Where should I keep my medicine? This drug is given in a hospital or clinic and will not be stored at home. NOTE: This sheet is a summary. It  may not cover all possible information. If you have questions about this medicine, talk to your doctor, pharmacist, or health care provider.  2013, [employer]/Gold Standard. (08/09/2009 1:43:15 PM)

## 2013-07-31 ENCOUNTER — Other Ambulatory Visit: Payer: Self-pay | Admitting: Certified Registered Nurse Anesthetist

## 2013-08-03 ENCOUNTER — Ambulatory Visit (HOSPITAL_BASED_OUTPATIENT_CLINIC_OR_DEPARTMENT_OTHER): Payer: Medicare Other | Admitting: Hematology & Oncology

## 2013-08-03 ENCOUNTER — Ambulatory Visit (HOSPITAL_BASED_OUTPATIENT_CLINIC_OR_DEPARTMENT_OTHER): Payer: Medicare Other | Admitting: Lab

## 2013-08-03 ENCOUNTER — Ambulatory Visit (HOSPITAL_BASED_OUTPATIENT_CLINIC_OR_DEPARTMENT_OTHER): Payer: Medicare Other

## 2013-08-03 VITALS — BP 138/72 | HR 89 | Temp 97.8°F | Resp 16 | Ht 65.0 in | Wt 135.0 lb

## 2013-08-03 DIAGNOSIS — Z5112 Encounter for antineoplastic immunotherapy: Secondary | ICD-10-CM

## 2013-08-03 DIAGNOSIS — C9000 Multiple myeloma not having achieved remission: Secondary | ICD-10-CM | POA: Diagnosis not present

## 2013-08-03 DIAGNOSIS — Z5111 Encounter for antineoplastic chemotherapy: Secondary | ICD-10-CM

## 2013-08-03 DIAGNOSIS — D539 Nutritional anemia, unspecified: Secondary | ICD-10-CM

## 2013-08-03 DIAGNOSIS — C50919 Malignant neoplasm of unspecified site of unspecified female breast: Secondary | ICD-10-CM

## 2013-08-03 DIAGNOSIS — C9001 Multiple myeloma in remission: Secondary | ICD-10-CM

## 2013-08-03 LAB — CMP (CANCER CENTER ONLY)
ALT(SGPT): 34 U/L (ref 10–47)
AST: 35 U/L (ref 11–38)
Albumin: 3.5 g/dL (ref 3.3–5.5)
Alkaline Phosphatase: 59 U/L (ref 26–84)
Calcium: 10.2 mg/dL (ref 8.0–10.3)
Chloride: 96 mEq/L — ABNORMAL LOW (ref 98–108)
Potassium: 4.6 mEq/L (ref 3.3–4.7)
Sodium: 132 mEq/L (ref 128–145)
Total Protein: 9.4 g/dL — ABNORMAL HIGH (ref 6.4–8.1)

## 2013-08-03 LAB — CBC WITH DIFFERENTIAL (CANCER CENTER ONLY)
BASO#: 0.1 10*3/uL (ref 0.0–0.2)
Eosinophils Absolute: 0 10*3/uL (ref 0.0–0.5)
HCT: 30.3 % — ABNORMAL LOW (ref 34.8–46.6)
HGB: 10.2 g/dL — ABNORMAL LOW (ref 11.6–15.9)
LYMPH#: 0.6 10*3/uL — ABNORMAL LOW (ref 0.9–3.3)
MCHC: 33.7 g/dL (ref 32.0–36.0)
MONO#: 0.8 10*3/uL (ref 0.1–0.9)
NEUT#: 4.7 10*3/uL (ref 1.5–6.5)
NEUT%: 76.7 % (ref 39.6–80.0)
Platelets: 292 10*3/uL (ref 145–400)
WBC: 6.1 10*3/uL (ref 3.9–10.0)

## 2013-08-03 MED ORDER — CYANOCOBALAMIN 1000 MCG/ML IJ SOLN
1000.0000 ug | Freq: Once | INTRAMUSCULAR | Status: AC
  Administered 2013-08-03: 1000 ug via INTRAMUSCULAR

## 2013-08-03 MED ORDER — DEXAMETHASONE SODIUM PHOSPHATE 20 MG/5ML IJ SOLN
INTRAMUSCULAR | Status: AC
  Filled 2013-08-03: qty 5

## 2013-08-03 MED ORDER — BORTEZOMIB CHEMO SQ INJECTION 3.5 MG (2.5MG/ML)
1.3000 mg/m2 | Freq: Once | INTRAMUSCULAR | Status: AC
  Administered 2013-08-03: 2.25 mg via SUBCUTANEOUS
  Filled 2013-08-03: qty 2.25

## 2013-08-03 MED ORDER — ONDANSETRON 16 MG/50ML IVPB (CHCC)
16.0000 mg | Freq: Once | INTRAVENOUS | Status: AC
  Administered 2013-08-03: 16 mg via INTRAVENOUS

## 2013-08-03 MED ORDER — CYANOCOBALAMIN 1000 MCG/ML IJ SOLN
INTRAMUSCULAR | Status: AC
  Filled 2013-08-03: qty 1

## 2013-08-03 MED ORDER — SODIUM CHLORIDE 0.9 % IV SOLN
250.0000 mg/m2 | Freq: Once | INTRAVENOUS | Status: AC
  Administered 2013-08-03: 440 mg via INTRAVENOUS
  Filled 2013-08-03: qty 22

## 2013-08-03 MED ORDER — ONDANSETRON 16 MG/50ML IVPB (CHCC)
INTRAVENOUS | Status: AC
  Filled 2013-08-03: qty 16

## 2013-08-03 MED ORDER — SODIUM CHLORIDE 0.9 % IV SOLN
Freq: Once | INTRAVENOUS | Status: AC
  Administered 2013-08-03: 13:00:00 via INTRAVENOUS

## 2013-08-03 MED ORDER — DEXAMETHASONE SODIUM PHOSPHATE 20 MG/5ML IJ SOLN
20.0000 mg | Freq: Once | INTRAMUSCULAR | Status: AC
  Administered 2013-08-03: 20 mg via INTRAVENOUS

## 2013-08-03 NOTE — Patient Instructions (Signed)
Cyanocobalamin, Vitamin B12 injection What is this medicine? CYANOCOBALAMIN (sye an oh koe BAL a min) is a man made form of vitamin B12. Vitamin B12 is used in the growth of healthy blood cells, nerve cells, and proteins in the body. It also helps with the metabolism of fats and carbohydrates. This medicine is used to treat people who can not absorb vitamin B12. This medicine may be used for other purposes; ask your health care provider or pharmacist if you have questions. What should I tell my health care provider before I take this medicine? They need to know if you have any of these conditions: -kidney disease -Leber's disease -megaloblastic anemia -an unusual or allergic reaction to cyanocobalamin, cobalt, other medicines, foods, dyes, or preservatives -pregnant or trying to get pregnant -breast-feeding How should I use this medicine? This medicine is injected into a muscle or deeply under the skin. It is usually given by a health care professional in a clinic or doctor's office. However, your doctor may teach you how to inject yourself. Follow all instructions. Talk to your pediatrician regarding the use of this medicine in children. Special care may be needed. Overdosage: If you think you have taken too much of this medicine contact a poison control center or emergency room at once. NOTE: This medicine is only for you. Do not share this medicine with others. What if I miss a dose? If you are given your dose at a clinic or doctor's office, call to reschedule your appointment. If you give your own injections and you miss a dose, take it as soon as you can. If it is almost time for your next dose, take only that dose. Do not take double or extra doses. What may interact with this medicine? -colchicine -heavy alcohol intake This list may not describe all possible interactions. Give your health care provider a list of all the medicines, herbs, non-prescription drugs, or dietary supplements you  use. Also tell them if you smoke, drink alcohol, or use illegal drugs. Some items may interact with your medicine. What should I watch for while using this medicine? Visit your doctor or health care professional regularly. You may need blood work done while you are taking this medicine. You may need to follow a special diet. Talk to your doctor. Limit your alcohol intake and avoid smoking to get the best benefit. What side effects may I notice from receiving this medicine? Side effects that you should report to your doctor or health care professional as soon as possible: -allergic reactions like skin rash, itching or hives, swelling of the face, lips, or tongue -blue tint to skin -chest tightness, pain -difficulty breathing, wheezing -dizziness -red, swollen painful area on the leg Side effects that usually do not require medical attention (report to your doctor or health care professional if they continue or are bothersome): -diarrhea -headache This list may not describe all possible side effects. Call your doctor for medical advice about side effects. You may report side effects to FDA at 1-800-FDA-1088. Where should I keep my medicine? Keep out of the reach of children. Store at room temperature between 15 and 30 degrees C (59 and 85 degrees F). Protect from light. Throw away any unused medicine after the expiration date. NOTE: This sheet is a summary. It may not cover all possible information. If you have questions about this medicine, talk to your doctor, pharmacist, or health care provider.  2012, [employer] Standard. (01/16/2008 10:10:20 PM)Cyclophosphamide injection What is this medicine? CYCLOPHOSPHAMIDE (sye  kloe FOSS fa mide) is a chemotherapy drug. It slows the growth of cancer cells. This medicine is used to treat many types of cancer like lymphoma, myeloma, leukemia, breast cancer, and ovarian cancer, to name a few. It is also used to treat nephrotic syndrome in children. This  medicine may be used for other purposes; ask your health care provider or pharmacist if you have questions. What should I tell my health care provider before I take this medicine? They need to know if you have any of these conditions: -blood disorders -history of other chemotherapy -history of radiation therapy -infection -kidney disease -liver disease -tumors in the bone marrow -an unusual or allergic reaction to cyclophosphamide, other chemotherapy, other medicines, foods, dyes, or preservatives -pregnant or trying to get pregnant -breast-feeding How should I use this medicine? This drug is usually given as an injection into a vein or muscle or by infusion into a vein. It is administered in a hospital or clinic by a specially trained health care professional. Talk to your pediatrician regarding the use of this medicine in children. While this drug may be prescribed for selected conditions, precautions do apply. Overdosage: If you think you have taken too much of this medicine contact a poison control center or emergency room at once. NOTE: This medicine is only for you. Do not share this medicine with others. What if I miss a dose? It is important not to miss your dose. Call your doctor or health care professional if you are unable to keep an appointment. What may interact with this medicine? Do not take this medicine with any of the following medications: -mibefradil -nalidixic acid This medicine may also interact with the following medications: -doxorubicin -etanercept -medicines to increase blood counts like filgrastim, pegfilgrastim, sargramostim -medicines that block muscle or nerve pain -St. John's Wort -phenobarbital -succinylcholine chloride -trastuzumab -vaccines Talk to your doctor or health care professional before taking any of these medicines: -acetaminophen -aspirin -ibuprofen -ketoprofen -naproxen This list may not describe all possible interactions. Give your  health care provider a list of all the medicines, herbs, non-prescription drugs, or dietary supplements you use. Also tell them if you smoke, drink alcohol, or use illegal drugs. Some items may interact with your medicine. What should I watch for while using this medicine? Visit your doctor for checks on your progress. This drug may make you feel generally unwell. This is not uncommon, as chemotherapy can affect healthy cells as well as cancer cells. Report any side effects. Continue your course of treatment even though you feel ill unless your doctor tells you to stop. Drink water or other fluids as directed. Urinate often, even at night. In some cases, you may be given additional medicines to help with side effects. Follow all directions for their use. Call your doctor or health care professional for advice if you get a fever, chills or sore throat, or other symptoms of a cold or flu. Do not treat yourself. This drug decreases your body's ability to fight infections. Try to avoid being around people who are sick. This medicine may increase your risk to bruise or bleed. Call your doctor or health care professional if you notice any unusual bleeding. Be careful brushing and flossing your teeth or using a toothpick because you may get an infection or bleed more easily. If you have any dental work done, tell your dentist you are receiving this medicine. Avoid taking products that contain aspirin, acetaminophen, ibuprofen, naproxen, or ketoprofen unless instructed by your doctor.  These medicines may hide a fever. Do not become pregnant while taking this medicine. Women should inform their doctor if they wish to become pregnant or think they might be pregnant. There is a potential for serious side effects to an unborn child. Talk to your health care professional or pharmacist for more information. Do not breast-feed an infant while taking this medicine. Men should inform their doctor if they wish to father a  child. This medicine may lower sperm counts. If you are going to have surgery, tell your doctor or health care professional that you have taken this medicine. What side effects may I notice from receiving this medicine? Side effects that you should report to your doctor or health care professional as soon as possible: -allergic reactions like skin rash, itching or hives, swelling of the face, lips, or tongue -low blood counts - this medicine may decrease the number of white blood cells, red blood cells and platelets. You may be at increased risk for infections and bleeding. -signs of infection - fever or chills, cough, sore throat, pain or difficulty passing urine -signs of decreased platelets or bleeding - bruising, pinpoint red spots on the skin, black, tarry stools, blood in the urine -signs of decreased red blood cells - unusually weak or tired, fainting spells, lightheadedness -breathing problems -dark urine -mouth sores -pain, swelling, redness at site where injected -swelling of the ankles, feet, hands -trouble passing urine or change in the amount of urine -weight gain -yellowing of the eyes or skin Side effects that usually do not require medical attention (report to your doctor or health care professional if they continue or are bothersome): -changes in nail or skin color -diarrhea -hair loss -loss of appetite -missed menstrual periods -nausea, vomiting -stomach pain This list may not describe all possible side effects. Call your doctor for medical advice about side effects. You may report side effects to FDA at 1-800-FDA-1088. Where should I keep my medicine? This drug is given in a hospital or clinic and will not be stored at home. NOTE: This sheet is a summary. It may not cover all possible information. If you have questions about this medicine, talk to your doctor, pharmacist, or health care provider.  2013, [employer]/Gold Standard. (01/10/2008 2:32:25 PM)

## 2013-08-04 NOTE — Progress Notes (Signed)
This office note has been dictated.

## 2013-08-04 NOTE — Progress Notes (Signed)
DIAGNOSES: 1. IgA kappa myeloma. 2. Stage IIB (T2 N1 M0) ductal carcinoma of the right breast (ER     negative/her-2 positive). 3. 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  CURRENT THERAPY: 1. Cytoxan/Velcade/Decadron (3 weeks on and 1 week off). 2. Herceptin/Perjeta q.3 week dosing. 3. Aranesp 300 mcg subcu as needed for hemoglobin less than 11. 4. Vitamin B12 1 mg IM q. month. 5. Zometa 4 mg IV q.3 weeks.  INTERIM HISTORY:  Jacqueline Rivas comes in for followup.  She actually looks pretty well.  She does not seem to be hurting as much.  She is back on her Duragesic patch.  This does seem to help her out.  She can afford this, which is important.  Her last myeloma studies finally did show a response.  Her monoclonal spike went down to 2.63 g/dL.  IgA was done at 3300 mg/dL.  Kappa light chain was 90.5 mg/dL.  She is eating pretty well.  Her weight is holding relatively stable. She has had no cough or shortness of breath.  She has had no headache. There has been no diarrhea.  Overall her performance status is ECOG 1.  She is wondering about vitamin B12.  I think this is reasonable to do for her.  She does have the anemia.  She probably would benefit from this.  Her anemia is macrocytic.  I am sure that her vitamin B12 levels probably are borderline.  PHYSICAL EXAMINATION:  General:  This is an elderly white female in no obvious distress.  Vital signs:  Temperature of 97.8, pulse 89, respiratory rate 16, blood pressure 138/72.  Weight is 135 pounds.  Head and neck:  Normocephalic, atraumatic skull.  There are no ocular or oral lesions.  There are no palpable cervical or supraclavicular lymph nodes. Lungs:  Clear bilaterally.  Cardiac:  Regular rate and rhythm with a normal S1, S2.  There are no murmurs, rubs or bruits.  Abdomen:  Soft. She has good bowel sounds.  There is no palpable abdominal mass.  There is no fluid wave.  There is no palpable hepatosplenomegaly.   Her breast exam, right chest wall shows with well-healed mastectomy.  There is no axillary adenopathy.  Left breast is unremarkable with no masses.  Back: Shows some slight kyphosis.  There is no tenderness over the spine, ribs or hips.  Extremities:  Show some osteoarthritic changes.  She has decent muscle strength bilaterally.  Skin:  Shows no rashes, ecchymoses or petechia.  LABORATORY STUDIES:  White cell count 6.1, hemoglobin 10.2, hematocrit 30.3, platelet count 292.  IMPRESSION:  Jacqueline Rivas is a very nice 79 year old white female with both IgA kappa myeloma and stage IIB ductal carcinoma of the right breast.  We are treating her for both.  She will get the Cytoxan/Velcade/Decadron today.  I think she is due for the Perjeta and Herceptin in a couple of weeks.  Hopefully we will see that her monoclonal studies continue to improve.  We are trying to coordinate as much therapy as possible to help minimize her visits to the office.  We will continue to treat her as scheduled.  I went over her lab results with her.  I continue to explain there are recommendations in our goals.  She has a very good understanding of these.    ______________________________ Josph Macho, M.D. PRE/MEDQ  D:  08/03/2013  T:  08/04/2013  Job:  [id number]

## 2013-08-07 ENCOUNTER — Encounter: Payer: Self-pay | Admitting: Hematology & Oncology

## 2013-08-08 LAB — IGG, IGA, IGM: IgM, Serum: 17 mg/dL — ABNORMAL LOW (ref 52–322)

## 2013-08-08 LAB — KAPPA/LAMBDA LIGHT CHAINS
Kappa free light chain: 84.8 mg/dL — ABNORMAL HIGH (ref 0.33–1.94)
Kappa:Lambda Ratio: 113.07 — ABNORMAL HIGH (ref 0.26–1.65)
Lambda Free Lght Chn: 0.75 mg/dL (ref 0.57–2.63)

## 2013-08-08 LAB — PROTEIN ELECTROPHORESIS, SERUM, WITH REFLEX
M-Spike, %: 2.63 g/dL
Total Protein, Serum Electrophoresis: 9.1 g/dL — ABNORMAL HIGH (ref 6.0–8.3)

## 2013-08-08 LAB — IFE INTERPRETATION

## 2013-08-10 ENCOUNTER — Ambulatory Visit: Payer: Medicare Other

## 2013-08-10 ENCOUNTER — Other Ambulatory Visit: Payer: Medicare Other | Admitting: Lab

## 2013-08-17 ENCOUNTER — Encounter: Payer: Self-pay | Admitting: *Deleted

## 2013-08-17 ENCOUNTER — Ambulatory Visit (HOSPITAL_BASED_OUTPATIENT_CLINIC_OR_DEPARTMENT_OTHER): Payer: Medicare Other | Admitting: Lab

## 2013-08-17 ENCOUNTER — Ambulatory Visit (HOSPITAL_BASED_OUTPATIENT_CLINIC_OR_DEPARTMENT_OTHER): Payer: Medicare Other

## 2013-08-17 ENCOUNTER — Telehealth: Payer: Self-pay | Admitting: Oncology

## 2013-08-17 DIAGNOSIS — C773 Secondary and unspecified malignant neoplasm of axilla and upper limb lymph nodes: Secondary | ICD-10-CM

## 2013-08-17 DIAGNOSIS — Z5112 Encounter for antineoplastic immunotherapy: Secondary | ICD-10-CM | POA: Diagnosis not present

## 2013-08-17 DIAGNOSIS — C50919 Malignant neoplasm of unspecified site of unspecified female breast: Secondary | ICD-10-CM

## 2013-08-17 DIAGNOSIS — C9000 Multiple myeloma not having achieved remission: Secondary | ICD-10-CM

## 2013-08-17 DIAGNOSIS — D6481 Anemia due to antineoplastic chemotherapy: Secondary | ICD-10-CM | POA: Diagnosis not present

## 2013-08-17 DIAGNOSIS — Z5111 Encounter for antineoplastic chemotherapy: Secondary | ICD-10-CM

## 2013-08-17 DIAGNOSIS — C9001 Multiple myeloma in remission: Secondary | ICD-10-CM

## 2013-08-17 DIAGNOSIS — C50911 Malignant neoplasm of unspecified site of right female breast: Secondary | ICD-10-CM

## 2013-08-17 LAB — CBC WITH DIFFERENTIAL (CANCER CENTER ONLY)
BASO#: 0 10*3/uL (ref 0.0–0.2)
BASO%: 0.8 % (ref 0.0–2.0)
EOS%: 0.2 % (ref 0.0–7.0)
HCT: 30 % — ABNORMAL LOW (ref 34.8–46.6)
HGB: 9.5 g/dL — ABNORMAL LOW (ref 11.6–15.9)
LYMPH#: 0.5 10*3/uL — ABNORMAL LOW (ref 0.9–3.3)
MCH: 33.8 pg (ref 26.0–34.0)
MCHC: 31.7 g/dL — ABNORMAL LOW (ref 32.0–36.0)
MONO#: 0.6 10*3/uL (ref 0.1–0.9)
MONO%: 11.3 % (ref 0.0–13.0)
NEUT#: 4.2 10*3/uL (ref 1.5–6.5)
NEUT%: 77.9 % (ref 39.6–80.0)
RBC: 2.81 10*6/uL — ABNORMAL LOW (ref 3.70–5.32)
RDW: 15.2 % (ref 11.1–15.7)

## 2013-08-17 LAB — COMPREHENSIVE METABOLIC PANEL
ALT: 20 U/L (ref 0–35)
AST: 22 U/L (ref 0–37)
Alkaline Phosphatase: 73 U/L (ref 39–117)
BUN: 14 mg/dL (ref 6–23)
Calcium: 10 mg/dL (ref 8.4–10.5)
Chloride: 105 mEq/L (ref 96–112)
Creatinine, Ser: 1.13 mg/dL — ABNORMAL HIGH (ref 0.50–1.10)
Glucose, Bld: 85 mg/dL (ref 70–99)
Sodium: 137 mEq/L (ref 135–145)

## 2013-08-17 MED ORDER — DARBEPOETIN ALFA-POLYSORBATE 300 MCG/0.6ML IJ SOLN
300.0000 ug | Freq: Once | INTRAMUSCULAR | Status: AC
  Administered 2013-08-17: 300 ug via SUBCUTANEOUS

## 2013-08-17 MED ORDER — SODIUM CHLORIDE 0.9 % IV SOLN
250.0000 mg/m2 | Freq: Once | INTRAVENOUS | Status: AC
  Administered 2013-08-17: 440 mg via INTRAVENOUS
  Filled 2013-08-17: qty 22

## 2013-08-17 MED ORDER — BORTEZOMIB CHEMO SQ INJECTION 3.5 MG (2.5MG/ML)
1.3000 mg/m2 | Freq: Once | INTRAMUSCULAR | Status: AC
  Administered 2013-08-17: 2.25 mg via SUBCUTANEOUS
  Filled 2013-08-17: qty 2.25

## 2013-08-17 MED ORDER — FENTANYL 12 MCG/HR TD PT72: 1.0000 | MEDICATED_PATCH | TRANSDERMAL | Status: DC

## 2013-08-17 MED ORDER — SODIUM CHLORIDE 0.9 % IV SOLN
420.0000 mg | Freq: Once | INTRAVENOUS | Status: AC
  Administered 2013-08-17: 420 mg via INTRAVENOUS
  Filled 2013-08-17: qty 14

## 2013-08-17 MED ORDER — DEXAMETHASONE SODIUM PHOSPHATE 20 MG/5ML IJ SOLN
INTRAMUSCULAR | Status: AC
  Filled 2013-08-17: qty 5

## 2013-08-17 MED ORDER — ACETAMINOPHEN 325 MG PO TABS
ORAL_TABLET | ORAL | Status: AC
  Filled 2013-08-17: qty 2

## 2013-08-17 MED ORDER — ONDANSETRON 16 MG/50ML IVPB (CHCC)
16.0000 mg | Freq: Once | INTRAVENOUS | Status: AC
  Administered 2013-08-17: 16 mg via INTRAVENOUS

## 2013-08-17 MED ORDER — ONDANSETRON 16 MG/50ML IVPB (CHCC)
INTRAVENOUS | Status: AC
  Filled 2013-08-17: qty 16

## 2013-08-17 MED ORDER — TRASTUZUMAB CHEMO INJECTION 440 MG
6.0000 mg/kg | Freq: Once | INTRAVENOUS | Status: AC
  Administered 2013-08-17: 378 mg via INTRAVENOUS
  Filled 2013-08-17: qty 18

## 2013-08-17 MED ORDER — SODIUM CHLORIDE 0.9 % IV SOLN
Freq: Once | INTRAVENOUS | Status: AC
  Administered 2013-08-17: 13:00:00 via INTRAVENOUS

## 2013-08-17 MED ORDER — ACETAMINOPHEN 325 MG PO TABS
650.0000 mg | ORAL_TABLET | Freq: Once | ORAL | Status: AC
  Administered 2013-08-17: 325 mg via ORAL

## 2013-08-17 MED ORDER — DEXAMETHASONE SODIUM PHOSPHATE 20 MG/5ML IJ SOLN
20.0000 mg | Freq: Once | INTRAMUSCULAR | Status: AC
  Administered 2013-08-17: 20 mg via INTRAVENOUS

## 2013-08-17 MED ORDER — DARBEPOETIN ALFA-POLYSORBATE 300 MCG/0.6ML IJ SOLN
INTRAMUSCULAR | Status: AC
  Filled 2013-08-17: qty 0.6

## 2013-08-17 MED ORDER — ZOLEDRONIC ACID 4 MG/5ML IV CONC
3.0000 mg | Freq: Once | INTRAVENOUS | Status: AC
  Administered 2013-08-17: 3 mg via INTRAVENOUS
  Filled 2013-08-17: qty 3.75

## 2013-08-17 NOTE — Patient Instructions (Signed)
Pertuzumab injection What is this medicine? PERTUZUMAB is a monoclonal antibody that targets a protein called HER2. HER2 is found in some breast cancers. This medicine can stop cancer cell growth. This medicine is used with other cancer treatments. This medicine may be used for other purposes; ask your health care provider or pharmacist if you have questions. What should I tell my health care provider before I take this medicine? They need to know if you have any of these conditions: -heart disease -heart failure -high blood pressure -history of irregular heart beat -recent or ongoing radiation therapy -an unusual or allergic reaction to pertuzumab, other medicines, foods, dyes, or preservatives -pregnant or trying to get pregnant -breast-feeding How should I use this medicine? This medicine is for infusion into a vein. It is given by a health care professional in a hospital or clinic setting. Talk to your pediatrician regarding the use of this medicine in children. Special care may be needed. Overdosage: If you think you've taken too much of this medicine contact a poison control center or emergency room at once. Overdosage: If you think you have taken too much of this medicine contact a poison control center or emergency room at once. NOTE: This medicine is only for you. Do not share this medicine with others. What if I miss a dose? It is important not to miss your dose. Call your doctor or health care professional if you are unable to keep an appointment. What may interact with this medicine? Interactions are not expected. Give your health care provider a list of all the medicines, herbs, non-prescription drugs, or dietary supplements you use. Also tell them if you smoke, drink alcohol, or use illegal drugs. Some items may interact with your medicine. This list may not describe all possible interactions. Give your health care provider a list of all the medicines, herbs, non-prescription  drugs, or dietary supplements you use. Also tell them if you smoke, drink alcohol, or use illegal drugs. Some items may interact with your medicine. What should I watch for while using this medicine? Your condition will be monitored carefully while you are receiving this medicine. Report any side effects. Continue your course of treatment even though you feel ill unless your doctor tells you to stop. Do not become pregnant while taking this medicine. Women should inform their doctor if they wish to become pregnant or think they might be pregnant. There is a potential for serious side effects to an unborn child. Talk to your health care professional or pharmacist for more information. Do not breast-feed an infant while taking this medicine. Call your doctor or health care professional for advice if you get a fever, chills or sore throat, or other symptoms of a cold or flu. Do not treat yourself. Try to avoid being around people who are sick. You may experience fever, chills, and headache during the infusion. Report any side effects during the infusion to your health care professional. What side effects may I notice from receiving this medicine? Side effects that you should report to your doctor or health care professional as soon as possible: -breathing problems -chest pain or palpitations -dizziness -feeling faint or lightheaded -fever or chills -skin rash, itching or hives -sore throat -swelling of the face, lips, or tongue -swelling of the legs or ankles -unusually weak or tired  Side effects that usually do not require medical attention (Report these to your doctor or health care professional if they continue or are bothersome.): -diarrhea -hair loss -nausea, vomiting -  tiredness This list may not describe all possible side effects. Call your doctor for medical advice about side effects. You may report side effects to FDA at 1-800-FDA-1088. Where should I keep my medicine? This drug is  given in a hospital or clinic and will not be stored at home. NOTE: This sheet is a summary. It may not cover all possible information. If you have questions about this medicine, talk to your doctor, pharmacist, or health care provider.  2013, [employer]. (04/01/2011 4:45:56 PM) Trastuzumab injection for infusion What is this medicine? TRASTUZUMAB (tras TOO zoo mab) is a monoclonal antibody. It targets a protein called HER2. This protein is found in some stomach and breast cancers. This medicine can stop cancer cell growth. This medicine may be used with other cancer treatments. This medicine may be used for other purposes; ask your health care provider or pharmacist if you have questions. What should I tell my health care provider before I take this medicine? They need to know if you have any of these conditions: -heart disease -heart failure -infection (especially a virus infection such as chickenpox, cold sores, or herpes) -lung or breathing disease, like asthma -recent or ongoing radiation therapy -an unusual or allergic reaction to trastuzumab, benzyl alcohol, or other medications, foods, dyes, or preservatives -pregnant or trying to get pregnant -breast-feeding How should I use this medicine? This drug is given as an infusion into a vein. It is administered in a hospital or clinic by a specially trained health care professional. Talk to your pediatrician regarding the use of this medicine in children. This medicine is not approved for use in children. Overdosage: If you think you have taken too much of this medicine contact a poison control center or emergency room at once. NOTE: This medicine is only for you. Do not share this medicine with others. What if I miss a dose? It is important not to miss a dose. Call your doctor or health care professional if you are unable to keep an appointment. What may interact with this  medicine? -cyclophosphamide -doxorubicin -warfarin This list may not describe all possible interactions. Give your health care provider a list of all the medicines, herbs, non-prescription drugs, or dietary supplements you use. Also tell them if you smoke, drink alcohol, or use illegal drugs. Some items may interact with your medicine. What should I watch for while using this medicine? Visit your doctor for checks on your progress. Report any side effects. Continue your course of treatment even though you feel ill unless your doctor tells you to stop. Call your doctor or health care professional for advice if you get a fever, chills or sore throat, or other symptoms of a cold or flu. Do not treat yourself. Try to avoid being around people who are sick. You may experience fever, chills and shaking during your first infusion. These effects are usually mild and can be treated with other medicines. Report any side effects during the infusion to your health care professional. Fever and chills usually do not happen with later infusions. What side effects may I notice from receiving this medicine? Side effects that you should report to your doctor or other health care professional as soon as possible: -breathing difficulties -chest pain or palpitations -cough -dizziness or fainting -fever or chills, sore throat -skin rash, itching or hives -swelling of the legs or ankles -unusually weak or tired Side effects that usually do not require medical attention (report to your doctor or other health care professional  if they continue or are bothersome): -loss of appetite -headache -muscle aches -nausea This list may not describe all possible side effects. Call your doctor for medical advice about side effects. You may report side effects to FDA at 1-800-FDA-1088. Where should I keep my medicine? This drug is given in a hospital or clinic and will not be stored at home. NOTE: This sheet is a summary. It  may not cover all possible information. If you have questions about this medicine, talk to your doctor, pharmacist, or health care provider.  2013, [employer]. (08/09/2009 1:43:15 PM) Darbepoetin Alfa injection What is this medicine? DARBEPOETIN ALFA (dar be POE e tin AL fa) helps your body make more red blood cells. It is used to treat anemia caused by chronic kidney failure and chemotherapy. This medicine may be used for other purposes; ask your health care provider or pharmacist if you have questions. What should I tell my health care provider before I take this medicine? They need to know if you have any of these conditions: -blood clotting disorders or history of blood clots -cancer patient not on chemotherapy -cystic fibrosis -heart disease, such as angina, heart failure, or a history of a heart attack -hemoglobin level of 12 g/dL or greater -high blood pressure -low levels of folate, iron, or vitamin B12 -seizures -an unusual or allergic reaction to darbepoetin, erythropoietin, albumin, hamster proteins, latex, other medicines, foods, dyes, or preservatives -pregnant or trying to get pregnant -breast-feeding How should I use this medicine? This medicine is for injection into a vein or under the skin. It is usually given by a health care professional in a hospital or clinic setting. If you get this medicine at home, you will be taught how to prepare and give this medicine. Do not shake the solution before you withdraw a dose. Use exactly as directed. Take your medicine at regular intervals. Do not take your medicine more often than directed. It is important that you put your used needles and syringes in a special sharps container. Do not put them in a trash can. If you do not have a sharps container, call your pharmacist or healthcare provider to get one. Talk to your pediatrician regarding the use of this medicine in children. While this medicine may be used in children as  young as 1 year for selected conditions, precautions do apply. Overdosage: If you think you have taken too much of this medicine contact a poison control center or emergency room at once. NOTE: This medicine is only for you. Do not share this medicine with others. What if I miss a dose? If you miss a dose, take it as soon as you can. If it is almost time for your next dose, take only that dose. Do not take double or extra doses. What may interact with this medicine? Do not take this medicine with any of the following medications: -epoetin alfa This list may not describe all possible interactions. Give your health care provider a list of all the medicines, herbs, non-prescription drugs, or dietary supplements you use. Also tell them if you smoke, drink alcohol, or use illegal drugs. Some items may interact with your medicine. What should I watch for while using this medicine? Visit your prescriber or health care professional for regular checks on your progress and for the needed blood tests and blood pressure measurements. It is especially important for the doctor to make sure your hemoglobin level is in the desired range, to limit the risk of potential  side effects and to give you the best benefit. Keep all appointments for any recommended tests. Check your blood pressure as directed. Ask your doctor what your blood pressure should be and when you should contact him or her. As your body makes more red blood cells, you may need to take iron, folic acid, or vitamin B supplements. Ask your doctor or health care provider which products are right for you. If you have kidney disease continue dietary restrictions, even though this medication can make you feel better. Talk with your doctor or health care professional about the foods you eat and the vitamins that you take. What side effects may I notice from receiving this medicine? Side effects that you should report to your doctor or health care professional  as soon as possible: -allergic reactions like skin rash, itching or hives, swelling of the face, lips, or tongue -breathing problems -changes in vision -chest pain -confusion, trouble speaking or understanding -feeling faint or lightheaded, falls -high blood pressure -muscle aches or pains -pain, swelling, warmth in the leg -rapid weight gain -severe headaches -sudden numbness or weakness of the face, arm or leg -trouble walking, dizziness, loss of balance or coordination -seizures (convulsions) -swelling of the ankles, feet, hands -unusually weak or tired Side effects that usually do not require medical attention (report to your doctor or health care professional if they continue or are bothersome): -diarrhea -fever, chills (flu-like symptoms) -headaches -nausea, vomiting -redness, stinging, or swelling at site where injected This list may not describe all possible side effects. Call your doctor for medical advice about side effects. You may report side effects to FDA at 1-800-FDA-1088. Where should I keep my medicine? Keep out of the reach of children. Store in a refrigerator between 2 and 8 degrees C (36 and 46 degrees F). Do not freeze. Do not shake. Throw away any unused portion if using a single-dose vial. Throw away any unused medicine after the expiration date. NOTE: This sheet is a summary. It may not cover all possible information. If you have questions about this medicine, talk to your doctor, pharmacist, or health care provider.  2013, [employer]. (09/18/2008 10:23:57 AM) Bortezomib injection What is this medicine? BORTEZOMIB (bor TEZ oh mib) is a chemotherapy drug. It slows the growth of cancer cells. This medicine is used to treat multiple myeloma, lymphoma, and other cancers. This medicine may be used for other purposes; ask your health care provider or pharmacist if you have questions. What should I tell my health care provider before I take this  medicine? They need to know if you have any of these conditions: -heart disease -irregular heartbeat -liver disease -low blood counts, like low white blood cells, platelets, or hemoglobin -peripheral neuropathy -taking medicine for blood pressure -an unusual or allergic reaction to bortezomib, mannitol, boron, other medicines, foods, dyes, or preservatives -pregnant or trying to get pregnant -breast-feeding How should I use this medicine? This medicine is for injection into a vein or for injection under the skin. It is given by a health care professional in a hospital or clinic setting. Talk to your pediatrician regarding the use of this medicine in children. Special care may be needed. Overdosage: If you think you have taken too much of this medicine contact a poison control center or emergency room at once. NOTE: This medicine is only for you. Do not share this medicine with others. What if I miss a dose? It is important not to miss your dose. Call your doctor or  health care professional if you are unable to keep an appointment. What may interact with this medicine? -medicines for diabetes -medicines to increase blood counts like filgrastim, pegfilgrastim, sargramostim -zalcitabine Talk to your doctor or health care professional before taking any of these medicines: -acetaminophen -aspirin -ibuprofen -ketoprofen -naproxen This list may not describe all possible interactions. Give your health care provider a list of all the medicines, herbs, non-prescription drugs, or dietary supplements you use. Also tell them if you smoke, drink alcohol, or use illegal drugs. Some items may interact with your medicine. What should I watch for while using this medicine? Visit your doctor for checks on your progress. This drug may make you feel generally unwell. This is not uncommon, as chemotherapy can affect healthy cells as well as cancer cells. Report any side effects. Continue your course of  treatment even though you feel ill unless your doctor tells you to stop. You may get drowsy or dizzy. Do not drive, use machinery, or do anything that needs mental alertness until you know how this medicine affects you. Do not stand or sit up quickly, especially if you are an older patient. This reduces the risk of dizzy or fainting spells. In some cases, you may be given additional medicines to help with side effects. Follow all directions for their use. Call your doctor or health care professional for advice if you get a fever, chills or sore throat, or other symptoms of a cold or flu. Do not treat yourself. This drug decreases your body's ability to fight infections. Try to avoid being around people who are sick. This medicine may increase your risk to bruise or bleed. Call your doctor or health care professional if you notice any unusual bleeding. Be careful brushing and flossing your teeth or using a toothpick because you may get an infection or bleed more easily. If you have any dental work done, tell your dentist you are receiving this medicine. Avoid taking products that contain aspirin, acetaminophen, ibuprofen, naproxen, or ketoprofen unless instructed by your doctor. These medicines may hide a fever. Do not become pregnant while taking this medicine. Women should inform their doctor if they wish to become pregnant or think they might be pregnant. There is a potential for serious side effects to an unborn child. Talk to your health care professional or pharmacist for more information. Do not breast-feed an infant while taking this medicine. You may have vomiting or diarrhea while taking this medicine. Drink water or other fluids as directed. What side effects may I notice from receiving this medicine? Side effects that you should report to your doctor or health care professional as soon as possible: -allergic reactions like skin rash, itching or hives, swelling of the face, lips, or  tongue -breathing problems -changes in hearing -changes in vision -fast, irregular heartbeat -feeling faint or lightheaded, falls -pain, tingling, numbness in the hands or feet -seizures -swelling of the ankles, feet, hands -unusual bleeding or bruising -unusually weak or tired -vomiting Side effects that usually do not require medical attention (report to your doctor or health care professional if they continue or are bothersome): -changes in emotions or moods -constipation -diarrhea -loss of appetite -headache -irritation at site where injected -nausea This list may not describe all possible side effects. Call your doctor for medical advice about side effects. You may report side effects to FDA at 1-800-FDA-1088. Where should I keep my medicine? This drug is given in a hospital or clinic and will not be stored at  home. NOTE: This sheet is a summary. It may not cover all possible information. If you have questions about this medicine, talk to your doctor, pharmacist, or health care provider.  2013, [employer]. (11/12/2010 11:42:36 AM) Cyclophosphamide injection What is this medicine? CYCLOPHOSPHAMIDE (sye kloe FOSS fa mide) is a chemotherapy drug. It slows the growth of cancer cells. This medicine is used to treat many types of cancer like lymphoma, myeloma, leukemia, breast cancer, and ovarian cancer, to name a few. It is also used to treat nephrotic syndrome in children. This medicine may be used for other purposes; ask your health care provider or pharmacist if you have questions. What should I tell my health care provider before I take this medicine? They need to know if you have any of these conditions: -blood disorders -history of other chemotherapy -history of radiation therapy -infection -kidney disease -liver disease -tumors in the bone marrow -an unusual or allergic reaction to cyclophosphamide, other chemotherapy, other medicines, foods, dyes, or  preservatives -pregnant or trying to get pregnant -breast-feeding How should I use this medicine? This drug is usually given as an injection into a vein or muscle or by infusion into a vein. It is administered in a hospital or clinic by a specially trained health care professional. Talk to your pediatrician regarding the use of this medicine in children. While this drug may be prescribed for selected conditions, precautions do apply. Overdosage: If you think you have taken too much of this medicine contact a poison control center or emergency room at once. NOTE: This medicine is only for you. Do not share this medicine with others. What if I miss a dose? It is important not to miss your dose. Call your doctor or health care professional if you are unable to keep an appointment. What may interact with this medicine? Do not take this medicine with any of the following medications: -mibefradil -nalidixic acid This medicine may also interact with the following medications: -doxorubicin -etanercept -medicines to increase blood counts like filgrastim, pegfilgrastim, sargramostim -medicines that block muscle or nerve pain -St. John's Wort -phenobarbital -succinylcholine chloride -trastuzumab -vaccines Talk to your doctor or health care professional before taking any of these medicines: -acetaminophen -aspirin -ibuprofen -ketoprofen -naproxen This list may not describe all possible interactions. Give your health care provider a list of all the medicines, herbs, non-prescription drugs, or dietary supplements you use. Also tell them if you smoke, drink alcohol, or use illegal drugs. Some items may interact with your medicine. What should I watch for while using this medicine? Visit your doctor for checks on your progress. This drug may make you feel generally unwell. This is not uncommon, as chemotherapy can affect healthy cells as well as cancer cells. Report any side effects. Continue your  course of treatment even though you feel ill unless your doctor tells you to stop. Drink water or other fluids as directed. Urinate often, even at night. In some cases, you may be given additional medicines to help with side effects. Follow all directions for their use. Call your doctor or health care professional for advice if you get a fever, chills or sore throat, or other symptoms of a cold or flu. Do not treat yourself. This drug decreases your body's ability to fight infections. Try to avoid being around people who are sick. This medicine may increase your risk to bruise or bleed. Call your doctor or health care professional if you notice any unusual bleeding. Be careful brushing and flossing your teeth  or using a toothpick because you may get an infection or bleed more easily. If you have any dental work done, tell your dentist you are receiving this medicine. Avoid taking products that contain aspirin, acetaminophen, ibuprofen, naproxen, or ketoprofen unless instructed by your doctor. These medicines may hide a fever. Do not become pregnant while taking this medicine. Women should inform their doctor if they wish to become pregnant or think they might be pregnant. There is a potential for serious side effects to an unborn child. Talk to your health care professional or pharmacist for more information. Do not breast-feed an infant while taking this medicine. Men should inform their doctor if they wish to father a child. This medicine may lower sperm counts. If you are going to have surgery, tell your doctor or health care professional that you have taken this medicine. What side effects may I notice from receiving this medicine? Side effects that you should report to your doctor or health care professional as soon as possible: -allergic reactions like skin rash, itching or hives, swelling of the face, lips, or tongue -low blood counts - this medicine may decrease the number of white blood cells,  red blood cells and platelets. You may be at increased risk for infections and bleeding. -signs of infection - fever or chills, cough, sore throat, pain or difficulty passing urine -signs of decreased platelets or bleeding - bruising, pinpoint red spots on the skin, black, tarry stools, blood in the urine -signs of decreased red blood cells - unusually weak or tired, fainting spells, lightheadedness -breathing problems -dark urine -mouth sores -pain, swelling, redness at site where injected -swelling of the ankles, feet, hands -trouble passing urine or change in the amount of urine -weight gain -yellowing of the eyes or skin Side effects that usually do not require medical attention (report to your doctor or health care professional if they continue or are bothersome): -changes in nail or skin color -diarrhea -hair loss -loss of appetite -missed menstrual periods -nausea, vomiting -stomach pain This list may not describe all possible side effects. Call your doctor for medical advice about side effects. You may report side effects to FDA at 1-800-FDA-1088. Where should I keep my medicine? This drug is given in a hospital or clinic and will not be stored at home. NOTE: This sheet is a summary. It may not cover all possible information. If you have questions about this medicine, talk to your doctor, pharmacist, or health care provider.  2013, [employer] Standard. (01/10/2008 2:32:25 PM)

## 2013-08-24 ENCOUNTER — Other Ambulatory Visit (HOSPITAL_BASED_OUTPATIENT_CLINIC_OR_DEPARTMENT_OTHER): Payer: Medicare Other | Admitting: Lab

## 2013-08-24 ENCOUNTER — Ambulatory Visit (HOSPITAL_BASED_OUTPATIENT_CLINIC_OR_DEPARTMENT_OTHER): Payer: Medicare Other

## 2013-08-24 DIAGNOSIS — C9000 Multiple myeloma not having achieved remission: Secondary | ICD-10-CM

## 2013-08-24 DIAGNOSIS — Z5112 Encounter for antineoplastic immunotherapy: Secondary | ICD-10-CM | POA: Diagnosis not present

## 2013-08-24 DIAGNOSIS — C9001 Multiple myeloma in remission: Secondary | ICD-10-CM

## 2013-08-24 LAB — CBC WITH DIFFERENTIAL (CANCER CENTER ONLY)
BASO#: 0.1 10*3/uL (ref 0.0–0.2)
BASO%: 1 % (ref 0.0–2.0)
EOS%: 0.2 % (ref 0.0–7.0)
HCT: 30.5 % — ABNORMAL LOW (ref 34.8–46.6)
HGB: 9.8 g/dL — ABNORMAL LOW (ref 11.6–15.9)
MCH: 33.4 pg (ref 26.0–34.0)
MCHC: 32.1 g/dL (ref 32.0–36.0)
MCV: 104 fL — ABNORMAL HIGH (ref 81–101)
MONO%: 16.3 % — ABNORMAL HIGH (ref 0.0–13.0)
NEUT#: 4.7 10*3/uL (ref 1.5–6.5)
NEUT%: 75.5 % (ref 39.6–80.0)
RDW: 15.4 % (ref 11.1–15.7)

## 2013-08-24 MED ORDER — ONDANSETRON 16 MG/50ML IVPB (CHCC)
16.0000 mg | Freq: Once | INTRAVENOUS | Status: AC
  Administered 2013-08-24: 16 mg via INTRAVENOUS

## 2013-08-24 MED ORDER — SODIUM CHLORIDE 0.9 % IV SOLN
Freq: Once | INTRAVENOUS | Status: AC
  Administered 2013-08-24: 12:00:00 via INTRAVENOUS

## 2013-08-24 MED ORDER — BORTEZOMIB CHEMO SQ INJECTION 3.5 MG (2.5MG/ML)
1.3000 mg/m2 | Freq: Once | INTRAMUSCULAR | Status: AC
  Administered 2013-08-24: 2.25 mg via SUBCUTANEOUS
  Filled 2013-08-24: qty 2.25

## 2013-08-24 MED ORDER — DEXAMETHASONE SODIUM PHOSPHATE 20 MG/5ML IJ SOLN
20.0000 mg | Freq: Once | INTRAMUSCULAR | Status: AC
  Administered 2013-08-24: 20 mg via INTRAVENOUS

## 2013-08-24 MED ORDER — DEXAMETHASONE SODIUM PHOSPHATE 20 MG/5ML IJ SOLN
INTRAMUSCULAR | Status: AC
  Filled 2013-08-24: qty 5

## 2013-08-24 MED ORDER — SODIUM CHLORIDE 0.9 % IV SOLN
250.0000 mg/m2 | Freq: Once | INTRAVENOUS | Status: AC
  Administered 2013-08-24: 440 mg via INTRAVENOUS
  Filled 2013-08-24: qty 22

## 2013-08-24 MED ORDER — ONDANSETRON 16 MG/50ML IVPB (CHCC)
INTRAVENOUS | Status: AC
  Filled 2013-08-24: qty 16

## 2013-08-24 NOTE — Patient Instructions (Signed)
[facility] Discharge Instructions for Patients Receiving Chemotherapy  Today you received the following chemotherapy agents: Velcade, Cytoxan.  To help prevent nausea and vomiting after your treatment, we encourage you to take your nausea medication.  If you develop nausea and vomiting that is not controlled by your nausea medication, call the clinic.   BELOW ARE SYMPTOMS THAT SHOULD BE REPORTED IMMEDIATELY:  *FEVER GREATER THAN 100.5 F  *CHILLS WITH OR WITHOUT FEVER  NAUSEA AND VOMITING THAT IS NOT CONTROLLED WITH YOUR NAUSEA MEDICATION  *UNUSUAL SHORTNESS OF BREATH  *UNUSUAL BRUISING OR BLEEDING  TENDERNESS IN MOUTH AND THROAT WITH OR WITHOUT PRESENCE OF ULCERS  *URINARY PROBLEMS  *BOWEL PROBLEMS  UNUSUAL RASH Items with * indicate a potential emergency and should be followed up as soon as possible.  Feel free to call the clinic you have any questions or concerns. The clinic phone number is [phone].    

## 2013-08-31 ENCOUNTER — Ambulatory Visit (HOSPITAL_BASED_OUTPATIENT_CLINIC_OR_DEPARTMENT_OTHER): Payer: Medicare Other | Admitting: Lab

## 2013-08-31 ENCOUNTER — Ambulatory Visit (HOSPITAL_BASED_OUTPATIENT_CLINIC_OR_DEPARTMENT_OTHER): Payer: Medicare Other | Admitting: Hematology & Oncology

## 2013-08-31 ENCOUNTER — Other Ambulatory Visit: Payer: Self-pay | Admitting: Oncology

## 2013-08-31 ENCOUNTER — Ambulatory Visit (HOSPITAL_BASED_OUTPATIENT_CLINIC_OR_DEPARTMENT_OTHER): Payer: Medicare Other

## 2013-08-31 DIAGNOSIS — C9000 Multiple myeloma not having achieved remission: Secondary | ICD-10-CM

## 2013-08-31 DIAGNOSIS — C50919 Malignant neoplasm of unspecified site of unspecified female breast: Secondary | ICD-10-CM

## 2013-08-31 DIAGNOSIS — Z5112 Encounter for antineoplastic immunotherapy: Secondary | ICD-10-CM | POA: Diagnosis not present

## 2013-08-31 DIAGNOSIS — D649 Anemia, unspecified: Secondary | ICD-10-CM

## 2013-08-31 DIAGNOSIS — C9001 Multiple myeloma in remission: Secondary | ICD-10-CM | POA: Diagnosis not present

## 2013-08-31 DIAGNOSIS — C50911 Malignant neoplasm of unspecified site of right female breast: Secondary | ICD-10-CM

## 2013-08-31 LAB — CBC WITH DIFFERENTIAL (CANCER CENTER ONLY)
BASO#: 0 10*3/uL (ref 0.0–0.2)
Eosinophils Absolute: 0 10*3/uL (ref 0.0–0.5)
HGB: 9.2 g/dL — ABNORMAL LOW (ref 11.6–15.9)
LYMPH#: 0.5 10*3/uL — ABNORMAL LOW (ref 0.9–3.3)
MCH: 33.7 pg (ref 26.0–34.0)
MONO%: 13.8 % — ABNORMAL HIGH (ref 0.0–13.0)
NEUT#: 3.8 10*3/uL (ref 1.5–6.5)
RBC: 2.73 10*6/uL — ABNORMAL LOW (ref 3.70–5.32)
RDW: 16.1 % — ABNORMAL HIGH (ref 11.1–15.7)

## 2013-08-31 MED ORDER — DIPHENHYDRAMINE HCL 25 MG PO CAPS: 50.0000 mg | ORAL_CAPSULE | Freq: Once | ORAL | Status: DC

## 2013-08-31 MED ORDER — SODIUM CHLORIDE 0.9 % IV SOLN
Freq: Once | INTRAVENOUS | Status: AC
  Administered 2013-08-31: 12:00:00 via INTRAVENOUS

## 2013-08-31 MED ORDER — ONDANSETRON 16 MG/50ML IVPB (CHCC)
INTRAVENOUS | Status: AC
  Filled 2013-08-31: qty 16

## 2013-08-31 MED ORDER — DEXAMETHASONE 4 MG PO TABS: ORAL_TABLET | ORAL | Status: DC

## 2013-08-31 MED ORDER — SODIUM CHLORIDE 0.9 % IV SOLN: 420.0000 mg | Freq: Once | INTRAVENOUS | Status: DC

## 2013-08-31 MED ORDER — CYANOCOBALAMIN 1000 MCG/ML IJ SOLN
1000.0000 ug | Freq: Once | INTRAMUSCULAR | Status: AC
  Administered 2013-08-31: 1000 ug via INTRAMUSCULAR

## 2013-08-31 MED ORDER — ONDANSETRON 16 MG/50ML IVPB (CHCC)
16.0000 mg | Freq: Once | INTRAVENOUS | Status: AC
  Administered 2013-08-31: 16 mg via INTRAVENOUS

## 2013-08-31 MED ORDER — SODIUM CHLORIDE 0.9 % IV SOLN
250.0000 mg/m2 | Freq: Once | INTRAVENOUS | Status: DC
  Filled 2013-08-31: qty 22

## 2013-08-31 MED ORDER — ACETAMINOPHEN 325 MG PO TABS: 650.0000 mg | ORAL_TABLET | Freq: Once | ORAL | Status: DC

## 2013-08-31 MED ORDER — SODIUM CHLORIDE 0.9 % IV SOLN: Freq: Once | INTRAVENOUS | Status: DC

## 2013-08-31 MED ORDER — CYANOCOBALAMIN 1000 MCG/ML IJ SOLN
INTRAMUSCULAR | Status: AC
  Filled 2013-08-31: qty 1

## 2013-08-31 MED ORDER — BORTEZOMIB CHEMO SQ INJECTION 3.5 MG (2.5MG/ML)
1.3000 mg/m2 | Freq: Once | INTRAMUSCULAR | Status: AC
  Administered 2013-08-31: 2.25 mg via SUBCUTANEOUS
  Filled 2013-08-31: qty 2.25

## 2013-08-31 MED ORDER — DEXAMETHASONE SODIUM PHOSPHATE 20 MG/5ML IJ SOLN
INTRAMUSCULAR | Status: AC
  Filled 2013-08-31: qty 5

## 2013-08-31 MED ORDER — TRASTUZUMAB CHEMO INJECTION 440 MG: 6.0000 mg/kg | Freq: Once | INTRAVENOUS | Status: DC

## 2013-08-31 MED ORDER — DEXAMETHASONE SODIUM PHOSPHATE 20 MG/5ML IJ SOLN
20.0000 mg | Freq: Once | INTRAMUSCULAR | Status: AC
  Administered 2013-08-31: 20 mg via INTRAVENOUS

## 2013-08-31 NOTE — Progress Notes (Signed)
Explained and gave written instructions on how to take Decadron and Zofran to pt and family. Verbalized understanding.

## 2013-08-31 NOTE — Progress Notes (Signed)
This office note has been dictated.

## 2013-08-31 NOTE — Patient Instructions (Addendum)
Please take Zofran 8 mg every 12 hours for 4 days starting the day after chemo then as needed for nausea. Also take Decadron 4 mg one every 12 hours for 4 days starting the day after chemo.  Cyanocobalamin, Vitamin B12 injection What is this medicine? CYANOCOBALAMIN (sye an oh koe BAL a min) is a man made form of vitamin B12. Vitamin B12 is used in the growth of healthy blood cells, nerve cells, and proteins in the body. It also helps with the metabolism of fats and carbohydrates. This medicine is used to treat people who can not absorb vitamin B12. This medicine may be used for other purposes; ask your health care provider or pharmacist if you have questions. What should I tell my health care provider before I take this medicine? They need to know if you have any of these conditions: -kidney disease -Leber's disease -megaloblastic anemia -an unusual or allergic reaction to cyanocobalamin, cobalt, other medicines, foods, dyes, or preservatives -pregnant or trying to get pregnant -breast-feeding How should I use this medicine? This medicine is injected into a muscle or deeply under the skin. It is usually given by a health care professional in a clinic or doctor's office. However, your doctor may teach you how to inject yourself. Follow all instructions. Talk to your pediatrician regarding the use of this medicine in children. Special care may be needed. Overdosage: If you think you have taken too much of this medicine contact a poison control center or emergency room at once. NOTE: This medicine is only for you. Do not share this medicine with others. What if I miss a dose? If you are given your dose at a clinic or doctor's office, call to reschedule your appointment. If you give your own injections and you miss a dose, take it as soon as you can. If it is almost time for your next dose, take only that dose. Do not take double or extra doses. What may interact with this  medicine? -colchicine -heavy alcohol intake This list may not describe all possible interactions. Give your health care provider a list of all the medicines, herbs, non-prescription drugs, or dietary supplements you use. Also tell them if you smoke, drink alcohol, or use illegal drugs. Some items may interact with your medicine. What should I watch for while using this medicine? Visit your doctor or health care professional regularly. You may need blood work done while you are taking this medicine. You may need to follow a special diet. Talk to your doctor. Limit your alcohol intake and avoid smoking to get the best benefit. What side effects may I notice from receiving this medicine? Side effects that you should report to your doctor or health care professional as soon as possible: -allergic reactions like skin rash, itching or hives, swelling of the face, lips, or tongue -blue tint to skin -chest tightness, pain -difficulty breathing, wheezing -dizziness -red, swollen painful area on the leg Side effects that usually do not require medical attention (report to your doctor or health care professional if they continue or are bothersome): -diarrhea -headache This list may not describe all possible side effects. Call your doctor for medical advice about side effects. You may report side effects to FDA at 1-800-FDA-1088. Where should I keep my medicine? Keep out of the reach of children. Store at room temperature between 15 and 30 degrees C (59 and 85 degrees F). Protect from light. Throw away any unused medicine after the expiration date. NOTE: This sheet is  a summary. It may not cover all possible information. If you have questions about this medicine, talk to your doctor, pharmacist, or health care provider.  2012, [employer]vier/Gold Standard. (01/16/2008 10:10:20 PM) Cyclophosphamide injection What is this medicine? CYCLOPHOSPHAMIDE (sye kloe FOSS fa mide) is a chemotherapy drug. It slows the  growth of cancer cells. This medicine is used to treat many types of cancer like lymphoma, myeloma, leukemia, breast cancer, and ovarian cancer, to name a few. It is also used to treat nephrotic syndrome in children. This medicine may be used for other purposes; ask your health care provider or pharmacist if you have questions. What should I tell my health care provider before I take this medicine? They need to know if you have any of these conditions: -blood disorders -history of other chemotherapy -history of radiation therapy -infection -kidney disease -liver disease -tumors in the bone marrow -an unusual or allergic reaction to cyclophosphamide, other chemotherapy, other medicines, foods, dyes, or preservatives -pregnant or trying to get pregnant -breast-feeding How should I use this medicine? This drug is usually given as an injection into a vein or muscle or by infusion into a vein. It is administered in a hospital or clinic by a specially trained health care professional. Talk to your pediatrician regarding the use of this medicine in children. While this drug may be prescribed for selected conditions, precautions do apply. Overdosage: If you think you have taken too much of this medicine contact a poison control center or emergency room at once. NOTE: This medicine is only for you. Do not share this medicine with others. What if I miss a dose? It is important not to miss your dose. Call your doctor or health care professional if you are unable to keep an appointment. What may interact with this medicine? Do not take this medicine with any of the following medications: -mibefradil -nalidixic acid This medicine may also interact with the following medications: -doxorubicin -etanercept -medicines to increase blood counts like filgrastim, pegfilgrastim, sargramostim -medicines that block muscle or nerve pain -St. John's Wort -phenobarbital -succinylcholine  chloride -trastuzumab -vaccines Talk to your doctor or health care professional before taking any of these medicines: -acetaminophen -aspirin -ibuprofen -ketoprofen -naproxen This list may not describe all possible interactions. Give your health care provider a list of all the medicines, herbs, non-prescription drugs, or dietary supplements you use. Also tell them if you smoke, drink alcohol, or use illegal drugs. Some items may interact with your medicine. What should I watch for while using this medicine? Visit your doctor for checks on your progress. This drug may make you feel generally unwell. This is not uncommon, as chemotherapy can affect healthy cells as well as cancer cells. Report any side effects. Continue your course of treatment even though you feel ill unless your doctor tells you to stop. Drink water or other fluids as directed. Urinate often, even at night. In some cases, you may be given additional medicines to help with side effects. Follow all directions for their use. Call your doctor or health care professional for advice if you get a fever, chills or sore throat, or other symptoms of a cold or flu. Do not treat yourself. This drug decreases your body's ability to fight infections. Try to avoid being around people who are sick. This medicine may increase your risk to bruise or bleed. Call your doctor or health care professional if you notice any unusual bleeding. Be careful brushing and flossing your teeth or using a toothpick because  you may get an infection or bleed more easily. If you have any dental work done, tell your dentist you are receiving this medicine. Avoid taking products that contain aspirin, acetaminophen, ibuprofen, naproxen, or ketoprofen unless instructed by your doctor. These medicines may hide a fever. Do not become pregnant while taking this medicine. Women should inform their doctor if they wish to become pregnant or think they might be pregnant. There  is a potential for serious side effects to an unborn child. Talk to your health care professional or pharmacist for more information. Do not breast-feed an infant while taking this medicine. Men should inform their doctor if they wish to father a child. This medicine may lower sperm counts. If you are going to have surgery, tell your doctor or health care professional that you have taken this medicine. What side effects may I notice from receiving this medicine? Side effects that you should report to your doctor or health care professional as soon as possible: -allergic reactions like skin rash, itching or hives, swelling of the face, lips, or tongue -low blood counts - this medicine may decrease the number of white blood cells, red blood cells and platelets. You may be at increased risk for infections and bleeding. -signs of infection - fever or chills, cough, sore throat, pain or difficulty passing urine -signs of decreased platelets or bleeding - bruising, pinpoint red spots on the skin, black, tarry stools, blood in the urine -signs of decreased red blood cells - unusually weak or tired, fainting spells, lightheadedness -breathing problems -dark urine -mouth sores -pain, swelling, redness at site where injected -swelling of the ankles, feet, hands -trouble passing urine or change in the amount of urine -weight gain -yellowing of the eyes or skin Side effects that usually do not require medical attention (report to your doctor or health care professional if they continue or are bothersome): -changes in nail or skin color -diarrhea -hair loss -loss of appetite -missed menstrual periods -nausea, vomiting -stomach pain This list may not describe all possible side effects. Call your doctor for medical advice about side effects. You may report side effects to FDA at 1-800-FDA-1088. Where should I keep my medicine? This drug is given in a hospital or clinic and will not be stored at  home. NOTE: This sheet is a summary. It may not cover all possible information. If you have questions about this medicine, talk to your doctor, pharmacist, or health care provider.  2013, [employer]vier/Gold Standard. (01/10/2008 2:32:25 PM) Bortezomib injection What is this medicine? BORTEZOMIB (bor TEZ oh mib) is a chemotherapy drug. It slows the growth of cancer cells. This medicine is used to treat multiple myeloma, lymphoma, and other cancers. This medicine may be used for other purposes; ask your health care provider or pharmacist if you have questions. COMMON BRAND NAME(S): Velcade What should I tell my health care provider before I take this medicine? They need to know if you have any of these conditions: -heart disease -irregular heartbeat -liver disease -low blood counts, like low white blood cells, platelets, or hemoglobin -peripheral neuropathy -taking medicine for blood pressure -an unusual or allergic reaction to bortezomib, mannitol, boron, other medicines, foods, dyes, or preservatives -pregnant or trying to get pregnant -breast-feeding How should I use this medicine? This medicine is for injection into a vein or for injection under the skin. It is given by a health care professional in a hospital or clinic setting. Talk to your pediatrician regarding the use of this medicine in  children. Special care may be needed. Overdosage: If you think you have taken too much of this medicine contact a poison control center or emergency room at once. NOTE: This medicine is only for you. Do not share this medicine with others. What if I miss a dose? It is important not to miss your dose. Call your doctor or health care professional if you are unable to keep an appointment. What may interact with this medicine? -medicines for diabetes -medicines to increase blood counts like filgrastim, pegfilgrastim, sargramostim -zalcitabine Talk to your doctor or health care professional before taking  any of these medicines: -acetaminophen -aspirin -ibuprofen -ketoprofen -naproxen This list may not describe all possible interactions. Give your health care provider a list of all the medicines, herbs, non-prescription drugs, or dietary supplements you use. Also tell them if you smoke, drink alcohol, or use illegal drugs. Some items may interact with your medicine. What should I watch for while using this medicine? Visit your doctor for checks on your progress. This drug may make you feel generally unwell. This is not uncommon, as chemotherapy can affect healthy cells as well as cancer cells. Report any side effects. Continue your course of treatment even though you feel ill unless your doctor tells you to stop. You may get drowsy or dizzy. Do not drive, use machinery, or do anything that needs mental alertness until you know how this medicine affects you. Do not stand or sit up quickly, especially if you are an older patient. This reduces the risk of dizzy or fainting spells. In some cases, you may be given additional medicines to help with side effects. Follow all directions for their use. Call your doctor or health care professional for advice if you get a fever, chills or sore throat, or other symptoms of a cold or flu. Do not treat yourself. This drug decreases your body's ability to fight infections. Try to avoid being around people who are sick. This medicine may increase your risk to bruise or bleed. Call your doctor or health care professional if you notice any unusual bleeding. Be careful brushing and flossing your teeth or using a toothpick because you may get an infection or bleed more easily. If you have any dental work done, tell your dentist you are receiving this medicine. Avoid taking products that contain aspirin, acetaminophen, ibuprofen, naproxen, or ketoprofen unless instructed by your doctor. These medicines may hide a fever. Do not become pregnant while taking this medicine.  Women should inform their doctor if they wish to become pregnant or think they might be pregnant. There is a potential for serious side effects to an unborn child. Talk to your health care professional or pharmacist for more information. Do not breast-feed an infant while taking this medicine. You may have vomiting or diarrhea while taking this medicine. Drink water or other fluids as directed. What side effects may I notice from receiving this medicine? Side effects that you should report to your doctor or health care professional as soon as possible: -allergic reactions like skin rash, itching or hives, swelling of the face, lips, or tongue -breathing problems -changes in hearing -changes in vision -fast, irregular heartbeat -feeling faint or lightheaded, falls -pain, tingling, numbness in the hands or feet -seizures -swelling of the ankles, feet, hands -unusual bleeding or bruising -unusually weak or tired -vomiting Side effects that usually do not require medical attention (report to your doctor or health care professional if they continue or are bothersome): -changes in emotions or moods -  constipation -diarrhea -loss of appetite -headache -irritation at site where injected -nausea This list may not describe all possible side effects. Call your doctor for medical advice about side effects. You may report side effects to FDA at 1-800-FDA-1088. Where should I keep my medicine? This drug is given in a hospital or clinic and will not be stored at home. NOTE: This sheet is a summary. It may not cover all possible information. If you have questions about this medicine, talk to your doctor, pharmacist, or health care provider.  2014, [employer]vier/Gold Standard. (2010-11-12 11:42:36)

## 2013-09-04 LAB — COMPREHENSIVE METABOLIC PANEL
Albumin: 3.4 g/dL — ABNORMAL LOW (ref 3.5–5.2)
BUN: 15 mg/dL (ref 6–23)
CO2: 26 mEq/L (ref 19–32)
Calcium: 9.6 mg/dL (ref 8.4–10.5)
Chloride: 96 mEq/L (ref 96–112)
Glucose, Bld: 142 mg/dL — ABNORMAL HIGH (ref 70–99)
Potassium: 3 mEq/L — ABNORMAL LOW (ref 3.5–5.3)

## 2013-09-04 LAB — PROTEIN ELECTROPHORESIS, SERUM, WITH REFLEX
Beta 2: 34.9 % — ABNORMAL HIGH (ref 3.2–6.5)
Beta Globulin: 5.3 % (ref 4.7–7.2)
M-Spike, %: 2.29 g/dL

## 2013-09-04 LAB — IFE INTERPRETATION

## 2013-09-04 LAB — IGG, IGA, IGM
IgA: 2840 mg/dL — ABNORMAL HIGH (ref 69–380)
IgG (Immunoglobin G), Serum: 208 mg/dL — ABNORMAL LOW (ref 690–1700)
IgM, Serum: 17 mg/dL — ABNORMAL LOW (ref 52–322)

## 2013-09-07 ENCOUNTER — Ambulatory Visit (HOSPITAL_BASED_OUTPATIENT_CLINIC_OR_DEPARTMENT_OTHER): Payer: Medicare Other

## 2013-09-07 ENCOUNTER — Other Ambulatory Visit: Payer: Self-pay | Admitting: Internal Medicine

## 2013-09-07 ENCOUNTER — Ambulatory Visit (HOSPITAL_BASED_OUTPATIENT_CLINIC_OR_DEPARTMENT_OTHER)
Admission: RE | Admit: 2013-09-07 | Discharge: 2013-09-07 | Disposition: A | Discharge status: Home / Self Care | Payer: Medicare Other | Source: Ambulatory Visit | Attending: Hematology & Oncology | Admitting: Hematology & Oncology

## 2013-09-07 ENCOUNTER — Other Ambulatory Visit (HOSPITAL_BASED_OUTPATIENT_CLINIC_OR_DEPARTMENT_OTHER): Payer: Medicare Other | Admitting: Lab

## 2013-09-07 VITALS — BP 113/71 | HR 91 | Temp 97.4°F | Resp 20

## 2013-09-07 DIAGNOSIS — C9000 Multiple myeloma not having achieved remission: Secondary | ICD-10-CM

## 2013-09-07 DIAGNOSIS — C50919 Malignant neoplasm of unspecified site of unspecified female breast: Secondary | ICD-10-CM

## 2013-09-07 DIAGNOSIS — Z5112 Encounter for antineoplastic immunotherapy: Secondary | ICD-10-CM

## 2013-09-07 DIAGNOSIS — M7989 Other specified soft tissue disorders: Secondary | ICD-10-CM

## 2013-09-07 DIAGNOSIS — C50911 Malignant neoplasm of unspecified site of right female breast: Secondary | ICD-10-CM

## 2013-09-07 DIAGNOSIS — M25579 Pain in unspecified ankle and joints of unspecified foot: Secondary | ICD-10-CM | POA: Insufficient documentation

## 2013-09-07 LAB — BASIC METABOLIC PANEL - CANCER CENTER ONLY
BUN, Bld: 22 mg/dL (ref 7–22)
CO2: 27 mEq/L (ref 18–33)
Calcium: 9.6 mg/dL (ref 8.0–10.3)
Chloride: 96 mEq/L — ABNORMAL LOW (ref 98–108)
Creat: 1.4 mg/dl — ABNORMAL HIGH (ref 0.6–1.2)
Glucose, Bld: 124 mg/dL — ABNORMAL HIGH (ref 73–118)
Potassium: 4.1 mEq/L (ref 3.3–4.7)

## 2013-09-07 LAB — CBC WITH DIFFERENTIAL (CANCER CENTER ONLY)
BASO#: 0 10*3/uL (ref 0.0–0.2)
EOS%: 0.4 % (ref 0.0–7.0)
Eosinophils Absolute: 0 10*3/uL (ref 0.0–0.5)
HCT: 35.8 % (ref 34.8–46.6)
HGB: 11.5 g/dL — ABNORMAL LOW (ref 11.6–15.9)
LYMPH#: 0.5 10*3/uL — ABNORMAL LOW (ref 0.9–3.3)
MCHC: 32.1 g/dL (ref 32.0–36.0)
MONO#: 0.6 10*3/uL (ref 0.1–0.9)
NEUT%: 84.9 % — ABNORMAL HIGH (ref 39.6–80.0)
RBC: 3.42 10*6/uL — ABNORMAL LOW (ref 3.70–5.32)
WBC: 7.8 10*3/uL (ref 3.9–10.0)

## 2013-09-07 MED ORDER — DIPHENHYDRAMINE HCL 25 MG PO CAPS
ORAL_CAPSULE | ORAL | Status: AC
  Filled 2013-09-07: qty 1

## 2013-09-07 MED ORDER — ACETAMINOPHEN 325 MG PO TABS
ORAL_TABLET | ORAL | Status: AC
  Filled 2013-09-07: qty 2

## 2013-09-07 MED ORDER — TRASTUZUMAB CHEMO INJECTION 440 MG
6.0000 mg/kg | Freq: Once | INTRAVENOUS | Status: AC
  Administered 2013-09-07: 378 mg via INTRAVENOUS
  Filled 2013-09-07: qty 18

## 2013-09-07 MED ORDER — ACETAMINOPHEN 325 MG PO TABS
650.0000 mg | ORAL_TABLET | Freq: Once | ORAL | Status: AC
  Administered 2013-09-07: 325 mg via ORAL

## 2013-09-07 MED ORDER — DIPHENHYDRAMINE HCL 25 MG PO CAPS
50.0000 mg | ORAL_CAPSULE | Freq: Once | ORAL | Status: AC
  Administered 2013-09-07: 25 mg via ORAL

## 2013-09-07 MED ORDER — SODIUM CHLORIDE 0.9 % IV SOLN
Freq: Once | INTRAVENOUS | Status: AC
  Administered 2013-09-07: 12:00:00 via INTRAVENOUS

## 2013-09-07 MED ORDER — SODIUM CHLORIDE 0.9 % IV SOLN
420.0000 mg | Freq: Once | INTRAVENOUS | Status: AC
  Administered 2013-09-07: 420 mg via INTRAVENOUS
  Filled 2013-09-07: qty 14

## 2013-09-07 NOTE — Progress Notes (Signed)
11:53 AM Dr. Myna Hidalgo notified of swollen, red right lower leg leg, examined by Dr. Myna Hidalgo, [place] ordered for today at 2:30.

## 2013-09-07 NOTE — Patient Instructions (Addendum)
Trastuzumab injection for infusion What is this medicine? TRASTUZUMAB (tras TOO zoo mab) is a monoclonal antibody. It targets a protein called HER2. This protein is found in some stomach and breast cancers. This medicine can stop cancer cell growth. This medicine may be used with other cancer treatments. This medicine may be used for other purposes; ask your health care provider or pharmacist if you have questions. COMMON BRAND NAME(S): Herceptin What should I tell my health care provider before I take this medicine? They need to know if you have any of these conditions: -heart disease -heart failure -infection (especially a virus infection such as chickenpox, cold sores, or herpes) -lung or breathing disease, like asthma -recent or ongoing radiation therapy -an unusual or allergic reaction to trastuzumab, benzyl alcohol, or other medications, foods, dyes, or preservatives -pregnant or trying to get pregnant -breast-feeding How should I use this medicine? This drug is given as an infusion into a vein. It is administered in a hospital or clinic by a specially trained health care professional. Talk to your pediatrician regarding the use of this medicine in children. This medicine is not approved for use in children. Overdosage: If you think you have taken too much of this medicine contact a poison control center or emergency room at once. NOTE: This medicine is only for you. Do not share this medicine with others. What if I miss a dose? It is important not to miss a dose. Call your doctor or health care professional if you are unable to keep an appointment. What may interact with this medicine? -cyclophosphamide -doxorubicin -warfarin This list may not describe all possible interactions. Give your health care provider a list of all the medicines, herbs, non-prescription drugs, or dietary supplements you use. Also tell them if you smoke, drink alcohol, or use illegal drugs. Some items may  interact with your medicine. What should I watch for while using this medicine? Visit your doctor for checks on your progress. Report any side effects. Continue your course of treatment even though you feel ill unless your doctor tells you to stop. Call your doctor or health care professional for advice if you get a fever, chills or sore throat, or other symptoms of a cold or flu. Do not treat yourself. Try to avoid being around people who are sick. You may experience fever, chills and shaking during your first infusion. These effects are usually mild and can be treated with other medicines. Report any side effects during the infusion to your health care professional. Fever and chills usually do not happen with later infusions. What side effects may I notice from receiving this medicine? Side effects that you should report to your doctor or other health care professional as soon as possible: -breathing difficulties -chest pain or palpitations -cough -dizziness or fainting -fever or chills, sore throat -skin rash, itching or hives -swelling of the legs or ankles -unusually weak or tired Side effects that usually do not require medical attention (report to your doctor or other health care professional if they continue or are bothersome): -loss of appetite -headache -muscle aches -nausea This list may not describe all possible side effects. Call your doctor for medical advice about side effects. You may report side effects to FDA at 1-800-FDA-1088. Where should I keep my medicine? This drug is given in a hospital or clinic and will not be stored at home. NOTE: This sheet is a summary. It may not cover all possible information. If you have questions about this medicine, talk   to your doctor, pharmacist, or health care provider.  2014, Elsevier/Gold Standard. (2009-08-09 13:43:15) Pertuzumab injection What is this medicine? PERTUZUMAB (per TOOZ ue mab) is a monoclonal antibody that targets a  protein called HER2. HER2 is found in some breast cancers. This medicine can stop cancer cell growth. This medicine is used with other cancer treatments. This medicine may be used for other purposes; ask your health care provider or pharmacist if you have questions. COMMON BRAND NAME(S): PERJETA What should I tell my health care provider before I take this medicine? They need to know if you have any of these conditions: -heart disease -heart failure -high blood pressure -history of irregular heart beat -recent or ongoing radiation therapy -an unusual or allergic reaction to pertuzumab, other medicines, foods, dyes, or preservatives -pregnant or trying to get pregnant -breast-feeding How should I use this medicine? This medicine is for infusion into a vein. It is given by a health care professional in a hospital or clinic setting. Talk to your pediatrician regarding the use of this medicine in children. Special care may be needed. Overdosage: If you think you've taken too much of this medicine contact a poison control center or emergency room at once. Overdosage: If you think you have taken too much of this medicine contact a poison control center or emergency room at once. NOTE: This medicine is only for you. Do not share this medicine with others. What if I miss a dose? It is important not to miss your dose. Call your doctor or health care professional if you are unable to keep an appointment. What may interact with this medicine? Interactions are not expected. Give your health care provider a list of all the medicines, herbs, non-prescription drugs, or dietary supplements you use. Also tell them if you smoke, drink alcohol, or use illegal drugs. Some items may interact with your medicine. This list may not describe all possible interactions. Give your health care provider a list of all the medicines, herbs, non-prescription drugs, or dietary supplements you use. Also tell them if you smoke,  drink alcohol, or use illegal drugs. Some items may interact with your medicine. What should I watch for while using this medicine? Your condition will be monitored carefully while you are receiving this medicine. Report any side effects. Continue your course of treatment even though you feel ill unless your doctor tells you to stop. Do not become pregnant while taking this medicine. Women should inform their doctor if they wish to become pregnant or think they might be pregnant. There is a potential for serious side effects to an unborn child. Talk to your health care professional or pharmacist for more information. Do not breast-feed an infant while taking this medicine. Call your doctor or health care professional for advice if you get a fever, chills or sore throat, or other symptoms of a cold or flu. Do not treat yourself. Try to avoid being around people who are sick. You may experience fever, chills, and headache during the infusion. Report any side effects during the infusion to your health care professional. What side effects may I notice from receiving this medicine? Side effects that you should report to your doctor or health care professional as soon as possible: -breathing problems -chest pain or palpitations -dizziness -feeling faint or lightheaded -fever or chills -skin rash, itching or hives -sore throat -swelling of the face, lips, or tongue -swelling of the legs or ankles -unusually weak or tired  Side effects that usually do not   require medical attention (Report these to your doctor or health care professional if they continue or are bothersome.): -diarrhea -hair loss -nausea, vomiting -tiredness This list may not describe all possible side effects. Call your doctor for medical advice about side effects. You may report side effects to FDA at 1-800-FDA-1088. Where should I keep my medicine? This drug is given in a hospital or clinic and will not be stored at home. NOTE:  This sheet is a summary. It may not cover all possible information. If you have questions about this medicine, talk to your doctor, pharmacist, or health care provider.  2014, Elsevier/Gold Standard. (2012-08-03 16:54:15)  

## 2013-09-08 ENCOUNTER — Telehealth: Payer: Self-pay | Admitting: *Deleted

## 2013-09-08 NOTE — Telephone Encounter (Addendum)
Message copied by Mirian Capuchin on Fri Sep 08, 2013 12:55 PM ------      Message from: Arlan Organ R      Created: Thu Sep 07, 2013  6:05 PM       Please call and let her know that there is no blood clot in her right leg. Thanks. Pete ------This message left on pt's home answering machine.

## 2013-09-09 NOTE — Progress Notes (Signed)
CC:   Jacqueline Rivas, M.D.  DIAGNOSES: 1. IgA kappa myeloma. 2. Stage IIB (T2 N1 M0) ductal carcinoma of the right breast (ER     negative/HER2 positive). 3. 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  CURRENT THERAPY: 1. Cytoxan/Velcade/Decadron (3 weeks on and 1 week off). 2. Herceptin/Perjeta q.3-week dosing. 3. Aranesp 300 mcg subcu as needed for hemoglobin less than 10. 4. Zometa 4 mg IV q.3 weeks.  INTERIM HISTORY:  Ms. Sneed comes in for a followup.  Sometimes it is hard to figure out how she is really doing.  She sort of takes the medicines the way she wants to.  She says she has no nausea or vomiting after chemotherapy.  I do not think she is on the schedule for postop nausea and vomiting.  We will have to get her on one, make sure that she does not get sick.  We are following the myeloma panel levels.  Back in October, her monoclonal spike was 2.63 g/dL.  Her IgA level was 3390 mg/dL.  Her kappa light chain was 84.8 mg/dL.  She really, in my mind, has tolerated treatment fairly well.  She does have a little swelling in the legs.  I think this is probably more so from anemia than anything else.  We last checked her echocardiogram just 2 months ago, and her ejection fraction was 55% to 60%.  She has had relatively stable renal function.  In late October, her BUN was 14, creatinine was 1.13.  I think this next monoclonal spike that we check on her will be very helpful in deciding whether or not this treatment is working or not.  As far as her breast cancer is concerned, I have really not seen any issues with respect to that.  She had a mastectomy.  She had 2 positive lymph nodes.  She did have some extracapsular extension.  She did not need any kind of postmastectomy radiation therapy.  Again, with Ms. Kady, it has been fairly complicated for her.  She has a performance status of ECOG 2 right now.  We wanted to try to get her on Revlimid, but she  would have to pay essentially out of pocket for this.  She does not seem be hurting too much.  She is on a fentanyl patch along with some intermittent Dilaudid.  She has had some diarrhea.  She takes activated charcoal for this.  PHYSICAL EXAM:  General:  This is an elderly, somewhat chronically ill- appearing white female in no obvious distress.  Vital Signs:  Show a temperature of 98.7, pulse 93, respiratory rate 14, blood pressure 136/77, weight is 136 pounds.  Head and Neck:  Exam shows a normocephalic and atraumatic skull.  There are no ocular or oral lesions.  There are no palpable cervical or supraclavicular lymph nodes. Lungs:  Clear bilaterally.  Cardiac:  Regular rate and rhythm with a normal S1 and S2.  She has a 1/6 systolic ejection murmur.  Breasts: Exam shows left breast with no masses, edema, or erythema.  There is no left axillary adenopathy.  Right chest wall shows a well-healed mastectomy.  No right chest wall nodules are noted.  There is no right axillary adenopathy.  Abdomen:  Slightly distended, which is chronic. She has good bowel sounds.  Maybe some slight discomfort over on the left side.  No masses noted.  There is no palpable hepatosplenomegaly. There is no fluid in the abdomen.  Extremities:  Show some 1+ pitting edema in  the lower legs.  She has osteoarthritic changes in her joints. She has decent strength in her muscles in her upper and lower extremities.  Back:  Exam shows kyphosis.  Neurological:  No focal neurological deficits.  LABORATORY STUDIES:  White cell count is 5, hemoglobin 9.2, hematocrit 28.4, platelet count 219.  IMPRESSION:  Ms. Mulgrew is a 79 year old white female.  She soon will be 81.  We are trying to treat 2 malignancies.  Right now, the myeloma is the more active of the problems.  Again, we have to make sure she gets on a postop regimen for nausea to minimize this from occurring.  We will have to see what the monoclonal  studies show at this time.  Again, she is having issues with having to pay Revlimid.  She has insurance that will not cover this, and she has too many resources to get assistance.  Hopefully, we will start to see some response with the Velcade/Cytoxan/Decadron program.  I am trying to coordinate all of her treatments to try to minimize her having to come back and forth all the time to the office.  We will see about getting her back in another 3 weeks or so.  I do not see that we have to do any x-rays on her right now.  She had her echocardiogram done back in September.  We may have to set another one up for her in December.  I spent about 40 minutes or so with her and her son today.    ______________________________ Josph Macho, M.D. PRE/MEDQ  D:  08/31/2013  T:  09/09/2013  Job:  [id number]

## 2013-09-20 ENCOUNTER — Telehealth: Payer: Self-pay | Admitting: *Deleted

## 2013-09-20 NOTE — Telephone Encounter (Signed)
Pt left message stating she was having a cough and congestion. Wants to know if she should still come in tomorrow. Reviewed with Dr Myna Hidalgo. To keep appt tomorrow but to start Omnicef 300 mg po bid x 7 days. Returned Jacqueline Rivas's call. She does not want to take the abx as "I feel much better now and feel as though I'm towards the end of this. It's been going on for about a week". She denies any fevers and says she only coughs when she gets up and moves around.

## 2013-09-21 ENCOUNTER — Ambulatory Visit (HOSPITAL_BASED_OUTPATIENT_CLINIC_OR_DEPARTMENT_OTHER): Payer: Medicare Other | Admitting: Lab

## 2013-09-21 ENCOUNTER — Ambulatory Visit (HOSPITAL_BASED_OUTPATIENT_CLINIC_OR_DEPARTMENT_OTHER): Payer: Medicare Other

## 2013-09-21 ENCOUNTER — Ambulatory Visit (HOSPITAL_BASED_OUTPATIENT_CLINIC_OR_DEPARTMENT_OTHER): Payer: Medicare Other | Admitting: Hematology & Oncology

## 2013-09-21 VITALS — BP 128/74 | HR 90 | Temp 97.8°F | Resp 16 | Ht 65.0 in | Wt 127.0 lb

## 2013-09-21 DIAGNOSIS — R509 Fever, unspecified: Secondary | ICD-10-CM

## 2013-09-21 DIAGNOSIS — J3489 Other specified disorders of nose and nasal sinuses: Secondary | ICD-10-CM

## 2013-09-21 DIAGNOSIS — J40 Bronchitis, not specified as acute or chronic: Secondary | ICD-10-CM

## 2013-09-21 DIAGNOSIS — M549 Dorsalgia, unspecified: Secondary | ICD-10-CM

## 2013-09-21 DIAGNOSIS — C50919 Malignant neoplasm of unspecified site of unspecified female breast: Secondary | ICD-10-CM | POA: Diagnosis not present

## 2013-09-21 DIAGNOSIS — C9000 Multiple myeloma not having achieved remission: Secondary | ICD-10-CM | POA: Diagnosis not present

## 2013-09-21 DIAGNOSIS — C773 Secondary and unspecified malignant neoplasm of axilla and upper limb lymph nodes: Secondary | ICD-10-CM | POA: Diagnosis not present

## 2013-09-21 DIAGNOSIS — C50911 Malignant neoplasm of unspecified site of right female breast: Secondary | ICD-10-CM

## 2013-09-21 LAB — CMP (CANCER CENTER ONLY)
ALT(SGPT): 20 U/L (ref 10–47)
Albumin: 3.6 g/dL (ref 3.3–5.5)
BUN, Bld: 25 mg/dL — ABNORMAL HIGH (ref 7–22)
CO2: 28 mEq/L (ref 18–33)
Calcium: 9.9 mg/dL (ref 8.0–10.3)
Chloride: 95 mEq/L — ABNORMAL LOW (ref 98–108)
Glucose, Bld: 108 mg/dL (ref 73–118)
Potassium: 4.4 mEq/L (ref 3.3–4.7)
Sodium: 135 mEq/L (ref 128–145)
Total Protein: 9.5 g/dL — ABNORMAL HIGH (ref 6.4–8.1)

## 2013-09-21 LAB — CBC WITH DIFFERENTIAL (CANCER CENTER ONLY)
Eosinophils Absolute: 0 10*3/uL (ref 0.0–0.5)
MCHC: 32.1 g/dL (ref 32.0–36.0)
MONO#: 0.8 10*3/uL (ref 0.1–0.9)
NEUT#: 7.6 10*3/uL — ABNORMAL HIGH (ref 1.5–6.5)
Platelets: 261 10*3/uL (ref 145–400)
RBC: 3.07 10*6/uL — ABNORMAL LOW (ref 3.70–5.32)
WBC: 9 10*3/uL (ref 3.9–10.0)

## 2013-09-21 MED ORDER — ONDANSETRON 16 MG/50ML IVPB (CHCC)
INTRAVENOUS | Status: AC
  Filled 2013-09-21: qty 16

## 2013-09-21 MED ORDER — ALBUTEROL SULFATE (5 MG/ML) 0.5% IN NEBU
INHALATION_SOLUTION | RESPIRATORY_TRACT | Status: AC
  Filled 2013-09-21: qty 0.5

## 2013-09-21 MED ORDER — DEXAMETHASONE SODIUM PHOSPHATE 20 MG/5ML IJ SOLN
INTRAMUSCULAR | Status: AC
  Filled 2013-09-21: qty 5

## 2013-09-21 MED ORDER — DARBEPOETIN ALFA-POLYSORBATE 300 MCG/0.6ML IJ SOLN: 300.0000 ug | Freq: Once | INTRAMUSCULAR | Status: DC

## 2013-09-21 MED ORDER — ZOLEDRONIC ACID 4 MG/5ML IV CONC
3.0000 mg | Freq: Once | INTRAVENOUS | Status: AC
  Administered 2013-09-21: 3 mg via INTRAVENOUS
  Filled 2013-09-21: qty 3.75

## 2013-09-21 MED ORDER — DEXTROSE 5 % IV SOLN
2.0000 g | INTRAVENOUS | Status: DC
  Administered 2013-09-21: 2 g via INTRAVENOUS
  Filled 2013-09-21: qty 2

## 2013-09-21 MED ORDER — IPRATROPIUM-ALBUTEROL 0.5-2.5 (3) MG/3ML IN SOLN
3.0000 mL | RESPIRATORY_TRACT | Status: DC
  Administered 2013-09-21: 3 mL via RESPIRATORY_TRACT
  Filled 2013-09-21: qty 3

## 2013-09-21 MED ORDER — DEXAMETHASONE SODIUM PHOSPHATE 20 MG/5ML IJ SOLN: 40.0000 mg | Freq: Once | INTRAMUSCULAR | Status: DC

## 2013-09-22 ENCOUNTER — Other Ambulatory Visit: Payer: Self-pay | Admitting: *Deleted

## 2013-09-22 DIAGNOSIS — C9000 Multiple myeloma not having achieved remission: Secondary | ICD-10-CM

## 2013-09-22 DIAGNOSIS — J988 Other specified respiratory disorders: Secondary | ICD-10-CM

## 2013-09-22 MED ORDER — CEFDINIR 300 MG PO CAPS: 300.0000 mg | ORAL_CAPSULE | Freq: Two times a day (BID) | ORAL | Status: DC

## 2013-09-22 NOTE — Progress Notes (Signed)
This office note has been dictated.

## 2013-09-22 NOTE — Telephone Encounter (Signed)
Received a call from [facility] regarding a rx the pt is expecting. Reviewed with Dr Myna Hidalgo. She is to start Omnicef 300 mg po bid x 7 days. Sent via e-rx as requested.

## 2013-09-25 LAB — IGG, IGA, IGM
IgA: 3030 mg/dL — ABNORMAL HIGH (ref 69–380)
IgG (Immunoglobin G), Serum: 240 mg/dL — ABNORMAL LOW (ref 690–1700)

## 2013-09-25 LAB — IFE INTERPRETATION

## 2013-09-25 LAB — PROTEIN ELECTROPHORESIS, SERUM, WITH REFLEX
Alpha-1-Globulin: 5.2 % — ABNORMAL HIGH (ref 2.9–4.9)
Alpha-2-Globulin: 15.1 % — ABNORMAL HIGH (ref 7.1–11.8)
Beta Globulin: 4.9 % (ref 4.7–7.2)
M-Spike, %: 2.3 g/dL
Total Protein, Serum Electrophoresis: 8.7 g/dL — ABNORMAL HIGH (ref 6.0–8.3)

## 2013-09-25 LAB — KAPPA/LAMBDA LIGHT CHAINS: Kappa free light chain: 124 mg/dL — ABNORMAL HIGH (ref 0.33–1.94)

## 2013-09-25 NOTE — Progress Notes (Signed)
DIAGNOSES: 1. IgA kappa myeloma. 2. Stage IIB (T2 N1 M0) ductal carcinoma of the right breast (HER2-     positive/ER negative). 3. 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  CURRENT THERAPY: 1. Cytoxan/Velcade/Decadron every week. 2. Zometa 4 mg IV q.3 weeks. 3. Herceptin/Perjeta q.3 week dosing. 4. Aranesp 300 mcg subcutaneously q.3 weeks as needed for hemoglobin     less than 10.  INTERIM HISTORY:  Jacqueline Rivas comes in for a followup.  She actually looks a little bit better.  Her weight is holding stable.  She seems to be eating a little bit better.  I am happy about that.  She is still having some back issues.  She had compression fractures likely from myeloma.  She had kyphoplasty.  She was on Duragesic patch. Unfortunately for some reason, she has been using these every 5 or 6 days.  We will try her on a Butrans patch.  This is a 10 mcg patch put on weekly.  This maybe a little bit easier for her.  She has had no nausea or vomiting.  She has had no problems with diarrhea.  There may be a little constipation.  She has had no bleeding.  Overall, her performance status is ECOG 2.  She has tolerated Perjeta and Herceptin pretty well.  She is really not a candidate for systemic chemotherapy for breast cancer because of her performance status.  PHYSICAL EXAMINATION:  General:  This is an elderly appearing white female in no obvious distress.  Vital Signs:  Temperature of 98, pulse 101, respiratory rate 14, blood pressure 151/84.  Weight is 137 pounds. Head and neck:  Normocephalic, atraumatic skull.  There are no ocular or oral lesions.  There are no palpable cervical or supraclavicular lymph nodes.  Lungs:  Clear bilaterally.  Cardiac:  Regular rate and rhythm with a normal S1 and S2.  She has a 1/6 systolic ejection murmur. Abdomen:  Soft.  She has decent bowel sounds.  There is no fluid wave. There is no palpable hepatosplenomegaly.  Back:  No tenderness over  the spine, ribs, or hips.  There may be a little bit of kyphosis. Extremities:  Some age-related osteoarthritic changes.  She has decent strength in her legs.  She has no tenderness over the long bones.  Skin: No rashes, ecchymosis, or petechia.  Neurologic:  No focal neurological deficits.  LABORATORY STUDIES:  White cell count is 4.7, hemoglobin 9.9, hematocrit 29.8, platelet count is 205.  Her IgA level is 3300 mg/dL.  Kappa light chain is 90.5 mg/dL.  BUN is 14, creatinine 1.1.  IMPRESSION:  Jacqueline Rivas is a very nice 79 year old white female.  She has separate but synchronous malignancies.  She has the IgA kappa myeloma.  This has been a little bit more resilient than I would have thought.  Hopefully, we might be seeing a response now.  We will have to see what her monoclonal spike is.  Hopefully, I will get that back in a day or so.  We will continue her on the Velcade/Cytoxan/Decadron.  I wanted her on Revlimid which she could not afford, but I think this might be able to be given to her if we find our treatments are not accomplishing our goal of decreasing her monoclonal spike.  Her breast cancer is being treated with Herceptin/Perjeta.  One thing that we may have to think about is whether or not she is going to need radiation therapy.  She had a mastectomy.  She had two,  I think, positive lymph nodes.  I realize this might be a little controversial as to the benefit of radiation therapy in this situation.  I will hold off on Aranesp on her right now.  Her quality of life seems to be doing a little bit better right now.  We will go ahead and plan to get her back in another month.  She comes back weekly for her Velcade and Cytoxan.    ______________________________ Josph Macho, M.D. PRE/MEDQ  D:  07/14/2013  T:  09/24/2013  Job:  [id number]

## 2013-09-25 NOTE — Progress Notes (Signed)
CC:   Jonita Albee, M.D.  DIAGNOSIS: 1. IgA kappa myeloma. 2. Stage IIB (T2 N1 M0) ductal carcinoma of the right breast (ER     negative/HER2 positive). 3. Anemia secondary to renal insufficiency/myeloma.  CURRENT THERAPY: 1. The patient to start Revlimid 20 mg p.o. daily (21 days on and 7     days off). 2. Herceptin/Perjeta q.3 week dosing. 3. Aranesp 300 mcg subcu for hemoglobin less than 10. 4. Zometa 4 mg IV q.3 weeks.  INTERIM HISTORY:  Ms. Giron comes in for followup.  She has lost 9 pounds since we last saw her.  She states she had a little bit of cold. Unfortunately, I think this is more related to her chemotherapy.  I think that she is not tolerating the Cytoxan/Velcade protocol.  As such, I think we really need to get her switched over to Revlimid.  We tried to get Revlimid before.  Unfortunately, there are payment issues.  She has too many assets that she gets assistance.  I did have a month sample in my office.  We will get her on 20 mg a day.  She is responding, slowly, to treatment.  The monoclonal spike is now down to 2.29 g/dL.  IgA level is at 2840 mg/dL.  Her kappa light chain is 118 mg/dL.  She does have this congestion.  We tried to get her on oral antibiotics. She refused these when we called her.  I will give her a dose of Rocephin today.  I will then put her on Omnicef.  Apparently, her son was with her.  Her son definitely has a good understanding of what is going on.  He definitely agrees with getting on to the Revlimid.  Ms. Crafts still has some back discomfort.  She did present with spinal lesions.  She got radiation therapy to the spine.  She got kyphoplasty.  She is on a fentanyl patch at 12 mcg, which seems to be helping her.  She has had some nausea.  There has been no vomiting.  She has a performance status of ECOG 2-3.  PHYSICAL EXAMINATION:  General:  This is an elderly, somewhat frail- appearing white female, in no obvious  distress.  Vital Signs: Temperature 97.8, pulse 90, respiratory rate 16, blood pressure 128/74. Weight is 127 pounds.  Head and Neck:  Normocephalic and atraumatic skull.  There are no ocular or oral lesions.  There are no palpable cervical or supraclavicular lymph nodes.  Lungs:  Clear bilaterally. Cardiac:  Regular rate and rhythm with a normal S1 and S2.  She has no murmurs, rubs, or bruits.  Abdomen:  Soft.  She has good bowel sounds. There is no fluid wave.  There is no palpable abdominal mass.  There is no palpable hepatosplenomegaly.  Back:  Shows some kyphosis.  She has no tenderness over the spine.  Extremities:  Show some osteoarthritic changes in her joints.  She has some slight chronic pitting edema in her lower legs and feet.  Muscle strength is 4+/5 bilaterally.  Skin:  No rashes.  Neurological:  No focal neurological deficits.  LABORATORY STUDIES:  White cell count is 9, hemoglobin 10.2, hematocrit 31.8, platelet count 271.  BUN is 25, creatinine 1.7.  Total protein is 9.5 with an albumin of 3.6.  IMPRESSION:  Ms. Lewison is a very nice 79 year old white female.  She has 2 active malignancies.  The myeloma has been very difficult to treat.  Again, side effects have been a real  problem.  We will stop the Cytoxan/Velcade and then get her on just Revlimid.  I really have to believe that Revlimid will help.  We will go ahead with the IV antibiotics today.  We will give her Zometa today also.  She does not need Aranesp today.  I spent a good 45 minutes with her and her son today.  Again, Ms. Haverstick has been very "independent" and has tried to manage as much as she could with her own wishes, and I respect this.  However, I really insisted that we change treatments on her.  She does finally agree.  Hopefully, we can get samples for her.  She and her son will make sure that she gets signed up for Medicare.  I will plan see her back in another 3-4 weeks.  She comes in I  think next week or so for the Herceptin/Perjeta.    ______________________________ Josph Macho, M.D. PRE/MEDQ  D:  09/22/2013  T:  09/25/2013  Job:  [id number]

## 2013-09-28 ENCOUNTER — Other Ambulatory Visit (HOSPITAL_BASED_OUTPATIENT_CLINIC_OR_DEPARTMENT_OTHER): Payer: Medicare Other | Admitting: Lab

## 2013-09-28 ENCOUNTER — Ambulatory Visit (HOSPITAL_BASED_OUTPATIENT_CLINIC_OR_DEPARTMENT_OTHER): Payer: Medicare Other

## 2013-09-28 ENCOUNTER — Other Ambulatory Visit: Payer: Self-pay | Admitting: *Deleted

## 2013-09-28 VITALS — BP 120/75 | HR 70 | Temp 97.0°F | Resp 16

## 2013-09-28 DIAGNOSIS — C50919 Malignant neoplasm of unspecified site of unspecified female breast: Secondary | ICD-10-CM | POA: Diagnosis not present

## 2013-09-28 DIAGNOSIS — C9000 Multiple myeloma not having achieved remission: Secondary | ICD-10-CM

## 2013-09-28 DIAGNOSIS — Z5112 Encounter for antineoplastic immunotherapy: Secondary | ICD-10-CM | POA: Diagnosis not present

## 2013-09-28 DIAGNOSIS — C50911 Malignant neoplasm of unspecified site of right female breast: Secondary | ICD-10-CM

## 2013-09-28 LAB — CBC WITH DIFFERENTIAL (CANCER CENTER ONLY)
BASO#: 0 10*3/uL (ref 0.0–0.2)
EOS%: 1.3 % (ref 0.0–7.0)
HCT: 29.9 % — ABNORMAL LOW (ref 34.8–46.6)
HGB: 9.5 g/dL — ABNORMAL LOW (ref 11.6–15.9)
MCH: 32.9 pg (ref 26.0–34.0)
MCHC: 31.8 g/dL — ABNORMAL LOW (ref 32.0–36.0)
MONO%: 11 % (ref 0.0–13.0)
NEUT%: 79.9 % (ref 39.6–80.0)

## 2013-09-28 LAB — BASIC METABOLIC PANEL
BUN: 27 mg/dL — ABNORMAL HIGH (ref 6–23)
CO2: 23 mEq/L (ref 19–32)
Calcium: 9.5 mg/dL (ref 8.4–10.5)
Chloride: 98 mEq/L (ref 96–112)
Glucose, Bld: 116 mg/dL — ABNORMAL HIGH (ref 70–99)
Sodium: 133 mEq/L — ABNORMAL LOW (ref 135–145)

## 2013-09-28 MED ORDER — ACETAMINOPHEN 325 MG PO TABS
ORAL_TABLET | ORAL | Status: AC
  Filled 2013-09-28: qty 2

## 2013-09-28 MED ORDER — SODIUM CHLORIDE 0.9 % IV SOLN
420.0000 mg | Freq: Once | INTRAVENOUS | Status: AC
  Administered 2013-09-28: 420 mg via INTRAVENOUS
  Filled 2013-09-28: qty 14

## 2013-09-28 MED ORDER — DEXAMETHASONE 4 MG PO TABS: ORAL_TABLET | ORAL | Status: DC

## 2013-09-28 MED ORDER — ACETAMINOPHEN 325 MG PO TABS
650.0000 mg | ORAL_TABLET | Freq: Once | ORAL | Status: AC
  Administered 2013-09-28: 650 mg via ORAL

## 2013-09-28 MED ORDER — TRASTUZUMAB CHEMO INJECTION 440 MG
6.0000 mg/kg | Freq: Once | INTRAVENOUS | Status: AC
  Administered 2013-09-28: 378 mg via INTRAVENOUS
  Filled 2013-09-28: qty 18

## 2013-09-28 MED ORDER — DIPHENHYDRAMINE HCL 25 MG PO CAPS
ORAL_CAPSULE | ORAL | Status: AC
  Filled 2013-09-28: qty 2

## 2013-09-28 MED ORDER — DIPHENHYDRAMINE HCL 25 MG PO CAPS
50.0000 mg | ORAL_CAPSULE | Freq: Once | ORAL | Status: AC
  Administered 2013-09-28: 50 mg via ORAL

## 2013-09-28 MED ORDER — SODIUM CHLORIDE 0.9 % IV SOLN
Freq: Once | INTRAVENOUS | Status: AC
  Administered 2013-09-28: 12:00:00 via INTRAVENOUS

## 2013-09-28 NOTE — Patient Instructions (Signed)
Pertuzumab injection What is this medicine? PERTUZUMAB (per TOOZ ue mab) is a monoclonal antibody that targets a protein called HER2. HER2 is found in some breast cancers. This medicine can stop cancer cell growth. This medicine is used with other cancer treatments. This medicine may be used for other purposes; ask your health care provider or pharmacist if you have questions. COMMON BRAND NAME(S): PERJETA What should I tell my health care provider before I take this medicine? They need to know if you have any of these conditions: -heart disease -heart failure -high blood pressure -history of irregular heart beat -recent or ongoing radiation therapy -an unusual or allergic reaction to pertuzumab, other medicines, foods, dyes, or preservatives -pregnant or trying to get pregnant -breast-feeding How should I use this medicine? This medicine is for infusion into a vein. It is given by a health care professional in a hospital or clinic setting. Talk to your pediatrician regarding the use of this medicine in children. Special care may be needed. Overdosage: If you think you've taken too much of this medicine contact a poison control center or emergency room at once. Overdosage: If you think you have taken too much of this medicine contact a poison control center or emergency room at once. NOTE: This medicine is only for you. Do not share this medicine with others. What if I miss a dose? It is important not to miss your dose. Call your doctor or health care professional if you are unable to keep an appointment. What may interact with this medicine? Interactions are not expected. Give your health care provider a list of all the medicines, herbs, non-prescription drugs, or dietary supplements you use. Also tell them if you smoke, drink alcohol, or use illegal drugs. Some items may interact with your medicine. This list may not describe all possible interactions. Give your health care provider a  list of all the medicines, herbs, non-prescription drugs, or dietary supplements you use. Also tell them if you smoke, drink alcohol, or use illegal drugs. Some items may interact with your medicine. What should I watch for while using this medicine? Your condition will be monitored carefully while you are receiving this medicine. Report any side effects. Continue your course of treatment even though you feel ill unless your doctor tells you to stop. Do not become pregnant while taking this medicine. Women should inform their doctor if they wish to become pregnant or think they might be pregnant. There is a potential for serious side effects to an unborn child. Talk to your health care professional or pharmacist for more information. Do not breast-feed an infant while taking this medicine. Call your doctor or health care professional for advice if you get a fever, chills or sore throat, or other symptoms of a cold or flu. Do not treat yourself. Try to avoid being around people who are sick. You may experience fever, chills, and headache during the infusion. Report any side effects during the infusion to your health care professional. What side effects may I notice from receiving this medicine? Side effects that you should report to your doctor or health care professional as soon as possible: -breathing problems -chest pain or palpitations -dizziness -feeling faint or lightheaded -fever or chills -skin rash, itching or hives -sore throat -swelling of the face, lips, or tongue -swelling of the legs or ankles -unusually weak or tired  Side effects that usually do not require medical attention (Report these to your doctor or health care professional if they continue   or are bothersome.): -diarrhea -hair loss -nausea, vomiting -tiredness This list may not describe all possible side effects. Call your doctor for medical advice about side effects. You may report side effects to FDA at  1-800-FDA-1088. Where should I keep my medicine? This drug is given in a hospital or clinic and will not be stored at home. NOTE: This sheet is a summary. It may not cover all possible information. If you have questions about this medicine, talk to your doctor, pharmacist, or health care provider.  2014, Elsevier/Gold Standard. (2012-08-03 16:54:15) Trastuzumab injection for infusion What is this medicine? TRASTUZUMAB (tras TOO zoo mab) is a monoclonal antibody. It targets a protein called HER2. This protein is found in some stomach and breast cancers. This medicine can stop cancer cell growth. This medicine may be used with other cancer treatments. This medicine may be used for other purposes; ask your health care provider or pharmacist if you have questions. COMMON BRAND NAME(S): Herceptin What should I tell my health care provider before I take this medicine? They need to know if you have any of these conditions: -heart disease -heart failure -infection (especially a virus infection such as chickenpox, cold sores, or herpes) -lung or breathing disease, like asthma -recent or ongoing radiation therapy -an unusual or allergic reaction to trastuzumab, benzyl alcohol, or other medications, foods, dyes, or preservatives -pregnant or trying to get pregnant -breast-feeding How should I use this medicine? This drug is given as an infusion into a vein. It is administered in a hospital or clinic by a specially trained health care professional. Talk to your pediatrician regarding the use of this medicine in children. This medicine is not approved for use in children. Overdosage: If you think you have taken too much of this medicine contact a poison control center or emergency room at once. NOTE: This medicine is only for you. Do not share this medicine with others. What if I miss a dose? It is important not to miss a dose. Call your doctor or health care professional if you are unable to keep an  appointment. What may interact with this medicine? -cyclophosphamide -doxorubicin -warfarin This list may not describe all possible interactions. Give your health care provider a list of all the medicines, herbs, non-prescription drugs, or dietary supplements you use. Also tell them if you smoke, drink alcohol, or use illegal drugs. Some items may interact with your medicine. What should I watch for while using this medicine? Visit your doctor for checks on your progress. Report any side effects. Continue your course of treatment even though you feel ill unless your doctor tells you to stop. Call your doctor or health care professional for advice if you get a fever, chills or sore throat, or other symptoms of a cold or flu. Do not treat yourself. Try to avoid being around people who are sick. You may experience fever, chills and shaking during your first infusion. These effects are usually mild and can be treated with other medicines. Report any side effects during the infusion to your health care professional. Fever and chills usually do not happen with later infusions. What side effects may I notice from receiving this medicine? Side effects that you should report to your doctor or other health care professional as soon as possible: -breathing difficulties -chest pain or palpitations -cough -dizziness or fainting -fever or chills, sore throat -skin rash, itching or hives -swelling of the legs or ankles -unusually weak or tired Side effects that usually do not require   medical attention (report to your doctor or other health care professional if they continue or are bothersome): -loss of appetite -headache -muscle aches -nausea This list may not describe all possible side effects. Call your doctor for medical advice about side effects. You may report side effects to FDA at 1-800-FDA-1088. Where should I keep my medicine? This drug is given in a hospital or clinic and will not be stored at  home. NOTE: This sheet is a summary. It may not cover all possible information. If you have questions about this medicine, talk to your doctor, pharmacist, or health care provider.  2014, Elsevier/Gold Standard. (2009-08-09 13:43:15)  

## 2013-10-11 ENCOUNTER — Ambulatory Visit: Payer: Medicare Other

## 2013-10-11 ENCOUNTER — Other Ambulatory Visit: Payer: Medicare Other | Admitting: Lab

## 2013-10-20 ENCOUNTER — Ambulatory Visit (HOSPITAL_BASED_OUTPATIENT_CLINIC_OR_DEPARTMENT_OTHER): Payer: Medicare Other | Admitting: Lab

## 2013-10-20 ENCOUNTER — Ambulatory Visit (HOSPITAL_BASED_OUTPATIENT_CLINIC_OR_DEPARTMENT_OTHER): Payer: Medicare Other | Admitting: Hematology & Oncology

## 2013-10-20 ENCOUNTER — Ambulatory Visit (HOSPITAL_BASED_OUTPATIENT_CLINIC_OR_DEPARTMENT_OTHER): Payer: Medicare Other

## 2013-10-20 ENCOUNTER — Encounter: Payer: Self-pay | Admitting: *Deleted

## 2013-10-20 ENCOUNTER — Other Ambulatory Visit: Payer: Self-pay | Admitting: *Deleted

## 2013-10-20 VITALS — BP 129/81 | HR 91 | Temp 97.5°F | Resp 14 | Ht 65.0 in | Wt 129.0 lb

## 2013-10-20 DIAGNOSIS — C773 Secondary and unspecified malignant neoplasm of axilla and upper limb lymph nodes: Secondary | ICD-10-CM

## 2013-10-20 DIAGNOSIS — C9000 Multiple myeloma not having achieved remission: Secondary | ICD-10-CM

## 2013-10-20 DIAGNOSIS — C50911 Malignant neoplasm of unspecified site of right female breast: Secondary | ICD-10-CM

## 2013-10-20 DIAGNOSIS — Z5112 Encounter for antineoplastic immunotherapy: Secondary | ICD-10-CM | POA: Diagnosis not present

## 2013-10-20 DIAGNOSIS — N039 Chronic nephritic syndrome with unspecified morphologic changes: Secondary | ICD-10-CM

## 2013-10-20 DIAGNOSIS — C50919 Malignant neoplasm of unspecified site of unspecified female breast: Secondary | ICD-10-CM | POA: Diagnosis not present

## 2013-10-20 DIAGNOSIS — N189 Chronic kidney disease, unspecified: Secondary | ICD-10-CM | POA: Diagnosis not present

## 2013-10-20 DIAGNOSIS — D631 Anemia in chronic kidney disease: Secondary | ICD-10-CM

## 2013-10-20 DIAGNOSIS — J40 Bronchitis, not specified as acute or chronic: Secondary | ICD-10-CM

## 2013-10-20 LAB — CBC WITH DIFFERENTIAL (CANCER CENTER ONLY)
BASO#: 0.1 10*3/uL (ref 0.0–0.2)
BASO%: 1.3 % (ref 0.0–2.0)
EOS ABS: 0 10*3/uL (ref 0.0–0.5)
EOS%: 0.5 % (ref 0.0–7.0)
HCT: 27.4 % — ABNORMAL LOW (ref 34.8–46.6)
HEMOGLOBIN: 8.6 g/dL — AB (ref 11.6–15.9)
LYMPH#: 0.5 10*3/uL — AB (ref 0.9–3.3)
LYMPH%: 9.8 % — ABNORMAL LOW (ref 14.0–48.0)
MCH: 32.6 pg (ref 26.0–34.0)
MCHC: 31.4 g/dL — ABNORMAL LOW (ref 32.0–36.0)
MCV: 104 fL — ABNORMAL HIGH (ref 81–101)
MONO#: 1.3 10*3/uL — AB (ref 0.1–0.9)
MONO%: 22.6 % — ABNORMAL HIGH (ref 0.0–13.0)
NEUT%: 65.8 % (ref 39.6–80.0)
NEUTROS ABS: 3.6 10*3/uL (ref 1.5–6.5)
Platelets: 217 10*3/uL (ref 145–400)
RBC: 2.64 10*6/uL — AB (ref 3.70–5.32)
RDW: 15.4 % (ref 11.1–15.7)
WBC: 5.5 10*3/uL (ref 3.9–10.0)

## 2013-10-20 MED ORDER — SODIUM CHLORIDE 0.9 % IV SOLN: Freq: Once | INTRAVENOUS | Status: DC

## 2013-10-20 MED ORDER — ZOLEDRONIC ACID 4 MG/5ML IV CONC
3.0000 mg | Freq: Once | INTRAVENOUS | Status: AC
  Administered 2013-10-20: 3 mg via INTRAVENOUS
  Filled 2013-10-20: qty 3.75

## 2013-10-20 MED ORDER — DEXAMETHASONE SODIUM PHOSPHATE 20 MG/5ML IJ SOLN
40.0000 mg | Freq: Once | INTRAMUSCULAR | Status: AC
  Administered 2013-10-20: 40 mg via INTRAVENOUS

## 2013-10-20 MED ORDER — FENTANYL 12 MCG/HR TD PT72: 12.5000 ug | MEDICATED_PATCH | TRANSDERMAL | Status: DC | PRN

## 2013-10-20 MED ORDER — DIPHENHYDRAMINE HCL 25 MG PO CAPS
50.0000 mg | ORAL_CAPSULE | Freq: Once | ORAL | Status: AC
  Administered 2013-10-20: 25 mg via ORAL

## 2013-10-20 MED ORDER — DIPHENHYDRAMINE HCL 25 MG PO CAPS
ORAL_CAPSULE | ORAL | Status: AC
  Filled 2013-10-20: qty 1

## 2013-10-20 MED ORDER — DARBEPOETIN ALFA-POLYSORBATE 300 MCG/0.6ML IJ SOLN
INTRAMUSCULAR | Status: AC
  Filled 2013-10-20: qty 0.6

## 2013-10-20 MED ORDER — SODIUM CHLORIDE 0.9 % IV SOLN
Freq: Once | INTRAVENOUS | Status: AC
  Administered 2013-10-20: 12:00:00 via INTRAVENOUS

## 2013-10-20 MED ORDER — ACETAMINOPHEN 325 MG PO TABS
650.0000 mg | ORAL_TABLET | Freq: Once | ORAL | Status: AC
  Administered 2013-10-20: 325 mg via ORAL

## 2013-10-20 MED ORDER — ACETAMINOPHEN 325 MG PO TABS
ORAL_TABLET | ORAL | Status: AC
  Filled 2013-10-20: qty 1

## 2013-10-20 MED ORDER — TRASTUZUMAB CHEMO INJECTION 440 MG
6.0000 mg/kg | Freq: Once | INTRAVENOUS | Status: AC
  Administered 2013-10-20: 378 mg via INTRAVENOUS
  Filled 2013-10-20: qty 18

## 2013-10-20 MED ORDER — DARBEPOETIN ALFA-POLYSORBATE 300 MCG/0.6ML IJ SOLN
300.0000 ug | Freq: Once | INTRAMUSCULAR | Status: AC
Start: 2013-10-20 — End: 2013-10-20
  Administered 2013-10-20: 300 ug via SUBCUTANEOUS

## 2013-10-20 MED ORDER — DEXAMETHASONE SODIUM PHOSPHATE 20 MG/5ML IJ SOLN
INTRAMUSCULAR | Status: AC
  Filled 2013-10-20: qty 10

## 2013-10-20 MED ORDER — SODIUM CHLORIDE 0.9 % IV SOLN
420.0000 mg | Freq: Once | INTRAVENOUS | Status: AC
  Administered 2013-10-20: 420 mg via INTRAVENOUS
  Filled 2013-10-20: qty 14

## 2013-10-20 MED ORDER — LENALIDOMIDE 20 MG PO CAPS: 20.0000 mg | ORAL_CAPSULE | Freq: Every morning | ORAL | Status: DC

## 2013-10-20 NOTE — Progress Notes (Signed)
Reactivated patient in [facility] system, sent application, RX, insurance information and face sheet to [facility]

## 2013-10-20 NOTE — Patient Instructions (Signed)
[facility] Discharge Instructions for Patients Receiving Chemotherapy  Today you received chemotherapy agents:  Perjeta, Herceptin, Zometa, and Aranesp.  To help prevent nausea and vomiting after your treatment, we encourage you to take your nausea medication as prescribed. If you develop nausea and vomiting that is not controlled by your nausea medication, call the clinic. If it is after clinic hours your family physician or the after hours number for the clinic or go to the Emergency Department.   BELOW ARE SYMPTOMS THAT SHOULD BE REPORTED IMMEDIATELY:  *FEVER GREATER THAN 100.5 F  *CHILLS WITH OR WITHOUT FEVER  NAUSEA AND VOMITING THAT IS NOT CONTROLLED WITH YOUR NAUSEA MEDICATION  *UNUSUAL SHORTNESS OF BREATH  *UNUSUAL BRUISING OR BLEEDING  TENDERNESS IN MOUTH AND THROAT WITH OR WITHOUT PRESENCE OF ULCERS  *URINARY PROBLEMS  *BOWEL PROBLEMS  UNUSUAL RASH Items with * indicate a potential emergency and should be followed up as soon as possible.   Please let the nurse know about any problems that you may have experienced. Feel free to call the clinic you have any questions or concerns. The clinic phone number is [phone].   I have been informed and understand all the instructions given to me. I know to contact the clinic, my physician, or go to the Emergency Department if any problems should occur. I do not have any questions at this time, but understand that I may call the clinic during office hours   should I have any questions or need assistance in obtaining follow up care.    __________________________________________  _____________  __________ Signature of Patient or Authorized Representative            Date                   Time    __________________________________________ Nurse's Signature

## 2013-10-20 NOTE — Progress Notes (Signed)
This office note has been dictated.

## 2013-10-21 NOTE — Progress Notes (Signed)
DIAGNOSES: 1. IgA kappa myeloma. 2. Stage IIB (T2, N1, M0) ductal carcinoma of the right breast (ER     negative/HER2 positive). 3. Anemia secondary to chronic renal insufficiency.  CURRENT THERAPY: 1. Herceptin/Perjeta q.3 weeks. 2. Aranesp 300 mcg subcu for hemoglobin less than 10. 3. Zometa 4 mg IV q.3 weeks. 4. Revlimid 20 mg p.o. daily (21 days on/7 days off) - the patient     still has not gotten a full prescription.  INTERIM HISTORY:  Ms. Vassel comes in for her followup.  Again, the issues that we are having are that we just cannot get her on Revlimid. I gave her some samples of that in the office.  I tried to get the company to provide month or so worth of samples of that, but they would not do it.  She just got Medicare Part D, but does not know how to use this.  It has been incredibly frustrating, trying to get her onto an oral medicine for the myeloma.  When we last saw her back in December, her myeloma spike was 2.3 g/dL, IgA was 3030 mg/dL and kappa light chain was 124 mg/dL.  These are holding stable.  She does feel tired.  She is little more anemic.  She does have the chronic renal insufficiency.  I have currently mentioned that she is taking aspirin with the Revlimid.  Again, trying to get her on Revlimid has been incredibly difficult.  She just will not pay for the Revlimid, which I understand.  She is not hurting.  She does use a fentanyl patch.  Of note, she is on Famvir.  She again, is also on the aspirin.  PHYSICAL EXAMINATION:  General:  This is an elderly white female in no obvious distress.  Vital Signs:  Temperature of 97.5, pulse 91, respiratory rate 14, blood pressure 129/81, weight is 129 pounds.  Head and Neck:  Normocephalic, atraumatic skull.  She has no ocular or oral lesions.  There are no palpable cervical or supraclavicular lymph nodes. Lungs:  Clear bilaterally.  Cardiac:  Regular rate and rhythm with normal S1 and S2.  She has a 1/6  systolic ejection murmur.  Abdomen: Soft.  She has good bowel sounds.  There is no fluid wave.  There is no palpable abdominal mass.  There is no palpable hepatosplenomegaly. Breasts:  Left breast with no masses, edema, or erythema.  There is no left axillary adenopathy.  Right chest wall shows well-healed mastectomy.  No right chest wall nodules are noted.  There is no axillary lymph nodes.  Back:  Kyphosis.  There is no tenderness over the spine.  Extremities:  Age-related osteoarthritic changes in her joints. She has trace edema in her legs.  Muscle strength is 4+/5 bilaterally.  LABORATORY STUDIES:  White cell count 5.5, hemoglobin 8.6, hematocrit 27.4, platelet count 217.  MCV is 104.  IMPRESSION:  Ms. Massmann is a very nice 80 year old white female.  She has both IgA kappa myeloma and stage IIB ductal carcinoma of the right breast.  Thankfully, the ductal carcinoma of the breast, we are treating with Herceptin and Perjeta.  As such, I do not think this is an issue for [place] right now.  Again, the real issue is going to be the myeloma.  She had on Cytoxan/Velcade.  She really did not respond well to that.  We are trying to use Revlimid.  I supposed if somehow we cannot use Revlimid, then the neck step would be carfilzomib.  Again, Ms. Myung does have the financial resources to pay for Revlimid, but again I do not think she needs to be spending few 1000 dollars a month for pill, that the drug company can provide for her for a month or so until the Medicare Part D becomes active.  We will plan to get her back in another 3-4 weeks.  She will get her Zometa today.  We will give her a dose of Aranesp today.    ______________________________ [name], M.D. PRE/MEDQ  D:  10/20/2013  T:  10/21/2013  Job:  [id number]

## 2013-10-24 LAB — COMPREHENSIVE METABOLIC PANEL
ALK PHOS: 65 U/L (ref 39–117)
ALT: 18 U/L (ref 0–35)
AST: 18 U/L (ref 0–37)
Albumin: 3.3 g/dL — ABNORMAL LOW (ref 3.5–5.2)
BUN: 16 mg/dL (ref 6–23)
CALCIUM: 8.7 mg/dL (ref 8.4–10.5)
CHLORIDE: 102 meq/L (ref 96–112)
CO2: 19 meq/L (ref 19–32)
Creatinine, Ser: 1.14 mg/dL — ABNORMAL HIGH (ref 0.50–1.10)
GLUCOSE: 100 mg/dL — AB (ref 70–99)
POTASSIUM: 3.5 meq/L (ref 3.5–5.3)
Sodium: 135 mEq/L (ref 135–145)
TOTAL PROTEIN: 7 g/dL (ref 6.0–8.3)
Total Bilirubin: 0.4 mg/dL (ref 0.3–1.2)

## 2013-10-24 LAB — PROTEIN ELECTROPHORESIS, SERUM, WITH REFLEX
Albumin ELP: 42.9 % — ABNORMAL LOW (ref 55.8–66.1)
Alpha-1-Globulin: 8 % — ABNORMAL HIGH (ref 2.9–4.9)
Alpha-2-Globulin: 12.8 % — ABNORMAL HIGH (ref 7.1–11.8)
Beta 2: 27.6 % — ABNORMAL HIGH (ref 3.2–6.5)
Beta Globulin: 6.7 % (ref 4.7–7.2)
Gamma Globulin: 2 % — ABNORMAL LOW (ref 11.1–18.8)
M-Spike, %: 1.52 g/dL
Total Protein, Serum Electrophoresis: 7 g/dL (ref 6.0–8.3)

## 2013-10-24 LAB — KAPPA/LAMBDA LIGHT CHAINS
Kappa free light chain: 8.53 mg/dL — ABNORMAL HIGH (ref 0.33–1.94)
Kappa:Lambda Ratio: 8.98 — ABNORMAL HIGH (ref 0.26–1.65)
Lambda Free Lght Chn: 0.95 mg/dL (ref 0.57–2.63)

## 2013-10-24 LAB — IFE INTERPRETATION

## 2013-10-24 LAB — PREALBUMIN: PREALBUMIN: 19.7 mg/dL (ref 17.0–34.0)

## 2013-10-24 LAB — IGG, IGA, IGM
IGA: 2060 mg/dL — AB (ref 69–380)
IGG (IMMUNOGLOBIN G), SERUM: 233 mg/dL — AB (ref 690–1700)
IGM, SERUM: 21 mg/dL — AB (ref 52–322)

## 2013-10-25 ENCOUNTER — Telehealth: Payer: Self-pay | Admitting: *Deleted

## 2013-10-25 NOTE — Telephone Encounter (Signed)
Called patient to let her know that her myeloma levels went down by 90% per d.r ennever.  Patient Appreciated this information

## 2013-10-25 NOTE — Telephone Encounter (Signed)
Message copied by Rico Ala on Wed Oct 25, 2013 12:39 PM ------      Message from: Burney Gauze R      Created: Mon Oct 23, 2013  8:42 PM       Call her son - myeloma went down by 90%!!!!  This is wonderful!!  Revlimid is working!!!  Film/video editor ------

## 2013-10-26 ENCOUNTER — Encounter: Payer: Self-pay | Admitting: *Deleted

## 2013-10-26 NOTE — Progress Notes (Signed)
/  Received a call from [facility].  They wanted to verfiy orders.  Will ship medication within the next five business days.

## 2013-11-09 ENCOUNTER — Ambulatory Visit: Payer: Medicare Other

## 2013-11-09 ENCOUNTER — Other Ambulatory Visit: Payer: Medicare Other | Admitting: Lab

## 2013-11-13 ENCOUNTER — Ambulatory Visit (HOSPITAL_BASED_OUTPATIENT_CLINIC_OR_DEPARTMENT_OTHER): Payer: Medicare Other | Admitting: Hematology & Oncology

## 2013-11-13 ENCOUNTER — Encounter: Payer: Self-pay | Admitting: Hematology & Oncology

## 2013-11-13 ENCOUNTER — Ambulatory Visit (HOSPITAL_BASED_OUTPATIENT_CLINIC_OR_DEPARTMENT_OTHER): Payer: Medicare Other

## 2013-11-13 ENCOUNTER — Ambulatory Visit (HOSPITAL_BASED_OUTPATIENT_CLINIC_OR_DEPARTMENT_OTHER): Payer: Medicare Other | Admitting: Lab

## 2013-11-13 ENCOUNTER — Other Ambulatory Visit: Payer: Self-pay | Admitting: Hematology & Oncology

## 2013-11-13 ENCOUNTER — Telehealth: Payer: Self-pay | Admitting: Hematology & Oncology

## 2013-11-13 VITALS — BP 122/78 | HR 102 | Temp 97.8°F | Resp 14 | Ht 66.0 in | Wt 129.0 lb

## 2013-11-13 DIAGNOSIS — C773 Secondary and unspecified malignant neoplasm of axilla and upper limb lymph nodes: Secondary | ICD-10-CM

## 2013-11-13 DIAGNOSIS — C9000 Multiple myeloma not having achieved remission: Secondary | ICD-10-CM

## 2013-11-13 DIAGNOSIS — D649 Anemia, unspecified: Secondary | ICD-10-CM

## 2013-11-13 DIAGNOSIS — I10 Essential (primary) hypertension: Secondary | ICD-10-CM

## 2013-11-13 DIAGNOSIS — C50919 Malignant neoplasm of unspecified site of unspecified female breast: Secondary | ICD-10-CM | POA: Diagnosis not present

## 2013-11-13 DIAGNOSIS — C50911 Malignant neoplasm of unspecified site of right female breast: Secondary | ICD-10-CM

## 2013-11-13 DIAGNOSIS — Z5112 Encounter for antineoplastic immunotherapy: Secondary | ICD-10-CM

## 2013-11-13 DIAGNOSIS — N289 Disorder of kidney and ureter, unspecified: Secondary | ICD-10-CM

## 2013-11-13 LAB — CMP (CANCER CENTER ONLY)
ALT(SGPT): 42 U/L (ref 10–47)
AST: 31 U/L (ref 11–38)
Albumin: 3.3 g/dL (ref 3.3–5.5)
Alkaline Phosphatase: 61 U/L (ref 26–84)
BILIRUBIN TOTAL: 0.8 mg/dL (ref 0.20–1.60)
BUN, Bld: 22 mg/dL (ref 7–22)
CALCIUM: 9.2 mg/dL (ref 8.0–10.3)
CO2: 25 mEq/L (ref 18–33)
CREATININE: 1.2 mg/dL (ref 0.6–1.2)
Chloride: 104 mEq/L (ref 98–108)
GLUCOSE: 100 mg/dL (ref 73–118)
Potassium: 3.6 mEq/L (ref 3.3–4.7)
Sodium: 135 mEq/L (ref 128–145)
Total Protein: 7.8 g/dL (ref 6.4–8.1)

## 2013-11-13 LAB — CBC WITH DIFFERENTIAL (CANCER CENTER ONLY)
BASO#: 0 10*3/uL (ref 0.0–0.2)
BASO%: 0.3 % (ref 0.0–2.0)
EOS ABS: 0.3 10*3/uL (ref 0.0–0.5)
EOS%: 3 % (ref 0.0–7.0)
HEMATOCRIT: 30.9 % — AB (ref 34.8–46.6)
HEMOGLOBIN: 9.7 g/dL — AB (ref 11.6–15.9)
LYMPH#: 0.7 10*3/uL — ABNORMAL LOW (ref 0.9–3.3)
LYMPH%: 8.4 % — AB (ref 14.0–48.0)
MCH: 32.7 pg (ref 26.0–34.0)
MCHC: 31.4 g/dL — ABNORMAL LOW (ref 32.0–36.0)
MCV: 104 fL — AB (ref 81–101)
MONO#: 0.9 10*3/uL (ref 0.1–0.9)
MONO%: 9.9 % (ref 0.0–13.0)
NEUT#: 6.9 10*3/uL — ABNORMAL HIGH (ref 1.5–6.5)
NEUT%: 78.4 % (ref 39.6–80.0)
Platelets: 173 10*3/uL (ref 145–400)
RBC: 2.97 10*6/uL — AB (ref 3.70–5.32)
RDW: 16.7 % — ABNORMAL HIGH (ref 11.1–15.7)
WBC: 8.7 10*3/uL (ref 3.9–10.0)

## 2013-11-13 MED ORDER — DARBEPOETIN ALFA-POLYSORBATE 300 MCG/0.6ML IJ SOLN
INTRAMUSCULAR | Status: AC
  Filled 2013-11-13: qty 0.6

## 2013-11-13 MED ORDER — DIPHENHYDRAMINE HCL 25 MG PO CAPS
ORAL_CAPSULE | ORAL | Status: AC
  Filled 2013-11-13: qty 1

## 2013-11-13 MED ORDER — DARBEPOETIN ALFA-POLYSORBATE 300 MCG/0.6ML IJ SOLN
300.0000 ug | Freq: Once | INTRAMUSCULAR | Status: AC
  Administered 2013-11-13: 300 ug via SUBCUTANEOUS

## 2013-11-13 MED ORDER — DIPHENHYDRAMINE HCL 25 MG PO CAPS
25.0000 mg | ORAL_CAPSULE | Freq: Once | ORAL | Status: AC
  Administered 2013-11-13: 25 mg via ORAL

## 2013-11-13 MED ORDER — SODIUM CHLORIDE 0.9 % IV SOLN
420.0000 mg | Freq: Once | INTRAVENOUS | Status: AC
  Administered 2013-11-13: 420 mg via INTRAVENOUS
  Filled 2013-11-13: qty 14

## 2013-11-13 MED ORDER — ZOLEDRONIC ACID 4 MG/5ML IV CONC
3.0000 mg | Freq: Once | INTRAVENOUS | Status: AC
  Administered 2013-11-13: 3 mg via INTRAVENOUS
  Filled 2013-11-13: qty 3.75

## 2013-11-13 MED ORDER — DEXAMETHASONE SODIUM PHOSPHATE 20 MG/5ML IJ SOLN: 40.0000 mg | Freq: Once | INTRAMUSCULAR | Status: DC

## 2013-11-13 MED ORDER — HEPARIN SOD (PORK) LOCK FLUSH 100 UNIT/ML IV SOLN
250.0000 [IU] | Freq: Once | INTRAVENOUS | Status: DC | PRN
  Filled 2013-11-13: qty 5

## 2013-11-13 MED ORDER — SODIUM CHLORIDE 0.9 % IJ SOLN
3.0000 mL | Freq: Once | INTRAMUSCULAR | Status: DC | PRN
  Filled 2013-11-13: qty 10

## 2013-11-13 MED ORDER — SODIUM CHLORIDE 0.9 % IJ SOLN
10.0000 mL | INTRAMUSCULAR | Status: DC | PRN
  Filled 2013-11-13: qty 10

## 2013-11-13 MED ORDER — SODIUM CHLORIDE 0.9 % IV SOLN
Freq: Once | INTRAVENOUS | Status: AC
  Administered 2013-11-13: 14:00:00 via INTRAVENOUS

## 2013-11-13 MED ORDER — ALTEPLASE 2 MG IJ SOLR
2.0000 mg | Freq: Once | INTRAMUSCULAR | Status: DC | PRN
  Filled 2013-11-13: qty 2

## 2013-11-13 MED ORDER — HEPARIN SOD (PORK) LOCK FLUSH 100 UNIT/ML IV SOLN
500.0000 [IU] | Freq: Once | INTRAVENOUS | Status: DC | PRN
  Filled 2013-11-13: qty 5

## 2013-11-13 MED ORDER — ACETAMINOPHEN 325 MG PO TABS
325.0000 mg | ORAL_TABLET | Freq: Once | ORAL | Status: AC
  Administered 2013-11-13: 325 mg via ORAL

## 2013-11-13 MED ORDER — TRASTUZUMAB CHEMO INJECTION 440 MG
6.0000 mg/kg | Freq: Once | INTRAVENOUS | Status: AC
  Administered 2013-11-13: 378 mg via INTRAVENOUS
  Filled 2013-11-13: qty 18

## 2013-11-13 MED ORDER — ACETAMINOPHEN 325 MG PO TABS: 650.0000 mg | ORAL_TABLET | Freq: Once | ORAL | Status: DC

## 2013-11-13 MED ORDER — ACETAMINOPHEN 325 MG PO TABS
ORAL_TABLET | ORAL | Status: AC
  Filled 2013-11-13: qty 1

## 2013-11-13 MED ORDER — DIPHENHYDRAMINE HCL 25 MG PO CAPS: 50.0000 mg | ORAL_CAPSULE | Freq: Once | ORAL | Status: DC

## 2013-11-13 NOTE — Progress Notes (Signed)
This office note has been dictated.

## 2013-11-13 NOTE — Telephone Encounter (Signed)
Per MD ok to keep next tx on 2-12. Left note for Baxter Flattery to check echo precert for secondary insurance

## 2013-11-13 NOTE — Patient Instructions (Signed)
Pertuzumab injection What is this medicine? PERTUZUMAB (per TOOZ ue mab) is a monoclonal antibody that targets a protein called HER2. HER2 is found in some breast cancers. This medicine can stop cancer cell growth. This medicine is used with other cancer treatments. This medicine may be used for other purposes; ask your health care provider or pharmacist if you have questions. COMMON BRAND NAME(S): PERJETA What should I tell my health care provider before I take this medicine? They need to know if you have any of these conditions: -heart disease -heart failure -high blood pressure -history of irregular heart beat -recent or ongoing radiation therapy -an unusual or allergic reaction to pertuzumab, other medicines, foods, dyes, or preservatives -pregnant or trying to get pregnant -breast-feeding How should I use this medicine? This medicine is for infusion into a vein. It is given by a health care professional in a hospital or clinic setting. Talk to your pediatrician regarding the use of this medicine in children. Special care may be needed. Overdosage: If you think you've taken too much of this medicine contact a poison control center or emergency room at once. Overdosage: If you think you have taken too much of this medicine contact a poison control center or emergency room at once. NOTE: This medicine is only for you. Do not share this medicine with others. What if I miss a dose? It is important not to miss your dose. Call your doctor or health care professional if you are unable to keep an appointment. What may interact with this medicine? Interactions are not expected. Give your health care provider a list of all the medicines, herbs, non-prescription drugs, or dietary supplements you use. Also tell them if you smoke, drink alcohol, or use illegal drugs. Some items may interact with your medicine. This list may not describe all possible interactions. Give your health care provider a  list of all the medicines, herbs, non-prescription drugs, or dietary supplements you use. Also tell them if you smoke, drink alcohol, or use illegal drugs. Some items may interact with your medicine. What should I watch for while using this medicine? Your condition will be monitored carefully while you are receiving this medicine. Report any side effects. Continue your course of treatment even though you feel ill unless your doctor tells you to stop. Do not become pregnant while taking this medicine. Women should inform their doctor if they wish to become pregnant or think they might be pregnant. There is a potential for serious side effects to an unborn child. Talk to your health care professional or pharmacist for more information. Do not breast-feed an infant while taking this medicine. Call your doctor or health care professional for advice if you get a fever, chills or sore throat, or other symptoms of a cold or flu. Do not treat yourself. Try to avoid being around people who are sick. You may experience fever, chills, and headache during the infusion. Report any side effects during the infusion to your health care professional. What side effects may I notice from receiving this medicine? Side effects that you should report to your doctor or health care professional as soon as possible: -breathing problems -chest pain or palpitations -dizziness -feeling faint or lightheaded -fever or chills -skin rash, itching or hives -sore throat -swelling of the face, lips, or tongue -swelling of the legs or ankles -unusually weak or tired  Side effects that usually do not require medical attention (Report these to your doctor or health care professional if they continue   or are bothersome.): -diarrhea -hair loss -nausea, vomiting -tiredness This list may not describe all possible side effects. Call your doctor for medical advice about side effects. You may report side effects to FDA at  1-800-FDA-1088. Where should I keep my medicine? This drug is given in a hospital or clinic and will not be stored at home. NOTE: This sheet is a summary. It may not cover all possible information. If you have questions about this medicine, talk to your doctor, pharmacist, or health care provider.  2014, Elsevier/Gold Standard. (2012-08-03 16:54:15) Trastuzumab injection for infusion What is this medicine? TRASTUZUMAB (tras TOO zoo mab) is a monoclonal antibody. It targets a protein called HER2. This protein is found in some stomach and breast cancers. This medicine can stop cancer cell growth. This medicine may be used with other cancer treatments. This medicine may be used for other purposes; ask your health care provider or pharmacist if you have questions. COMMON BRAND NAME(S): Herceptin What should I tell my health care provider before I take this medicine? They need to know if you have any of these conditions: -heart disease -heart failure -infection (especially a virus infection such as chickenpox, cold sores, or herpes) -lung or breathing disease, like asthma -recent or ongoing radiation therapy -an unusual or allergic reaction to trastuzumab, benzyl alcohol, or other medications, foods, dyes, or preservatives -pregnant or trying to get pregnant -breast-feeding How should I use this medicine? This drug is given as an infusion into a vein. It is administered in a hospital or clinic by a specially trained health care professional. Talk to your pediatrician regarding the use of this medicine in children. This medicine is not approved for use in children. Overdosage: If you think you have taken too much of this medicine contact a poison control center or emergency room at once. NOTE: This medicine is only for you. Do not share this medicine with others. What if I miss a dose? It is important not to miss a dose. Call your doctor or health care professional if you are unable to keep an  appointment. What may interact with this medicine? -cyclophosphamide -doxorubicin -warfarin This list may not describe all possible interactions. Give your health care provider a list of all the medicines, herbs, non-prescription drugs, or dietary supplements you use. Also tell them if you smoke, drink alcohol, or use illegal drugs. Some items may interact with your medicine. What should I watch for while using this medicine? Visit your doctor for checks on your progress. Report any side effects. Continue your course of treatment even though you feel ill unless your doctor tells you to stop. Call your doctor or health care professional for advice if you get a fever, chills or sore throat, or other symptoms of a cold or flu. Do not treat yourself. Try to avoid being around people who are sick. You may experience fever, chills and shaking during your first infusion. These effects are usually mild and can be treated with other medicines. Report any side effects during the infusion to your health care professional. Fever and chills usually do not happen with later infusions. What side effects may I notice from receiving this medicine? Side effects that you should report to your doctor or other health care professional as soon as possible: -breathing difficulties -chest pain or palpitations -cough -dizziness or fainting -fever or chills, sore throat -skin rash, itching or hives -swelling of the legs or ankles -unusually weak or tired Side effects that usually do not require   medical attention (report to your doctor or other health care professional if they continue or are bothersome): -loss of appetite -headache -muscle aches -nausea This list may not describe all possible side effects. Call your doctor for medical advice about side effects. You may report side effects to FDA at 1-800-FDA-1088. Where should I keep my medicine? This drug is given in a hospital or clinic and will not be stored at  home. NOTE: This sheet is a summary. It may not cover all possible information. If you have questions about this medicine, talk to your doctor, pharmacist, or health care provider.  2014, Elsevier/Gold Standard. (2009-08-09 13:43:15)

## 2013-11-14 NOTE — Progress Notes (Signed)
CC:   Orma Flaming, M.D.  DIAGNOSES: 1. IgA kappa myeloma. 2. Stage IIB (T2 N1 M0) ductal carcinoma of the right breast (ER     negative/HER2 positive). 3. Anemia secondary to renal insufficiency/myeloma.  CURRENT THERAPY: 1. Revlimid 20 mg p.o. daily (21 days on/7 days off). 2. Herceptin/Perjeta q.3 week dosing. 3. Zometa 4 mg IV q.3 weeks. 4. Aranesp 300 mcg subcu as needed for hemoglobin less than 10.  INTERIM HISTORY:  Ms. Petrides comes in for followup.  She is responding to Revlimid.  I knew that she would respond to Revlimid.  When we checked her myeloma studies in January, after being on Revlimid for about 2 weeks or so, her level had come down very well.  Her monoclonal spike was now at 1.52 g/dL.  Her IgA level was 2060 mg/dL and most remarkable was that her kappa light chain went down from 124 mg/dL down to 8.5 mg/dL.  She is feeling little tired from the Revlimid.  She does not have lot of energy.  She just does not do lot of activities.  Her son comes in with her.  He says she just does not exercise much.  I encouraged her to try to exercise little bit more.  I know that she does have quite a few friends who she can be with.  She now has Revlimid and is being covered.  She needs to have an echocardiogram done.  Her last one was done in September.  We thought, I need to get one set up because she is on Herceptin.  There is no clinical evidence of heart failure.  Her appetite comes and goes.  She has had little diarrhea.  I told her to take Imodium for this.  She has had improved bony pain issues.  She is on a Duragesic patch. She does not seem to be needing much in the way of breakthrough medication.  Overall, her performance status is ECOG 2.  PHYSICAL EXAMINATION:  General:  This is an elderly white female in no obvious distress.  Vital Signs:  Her temperature of 97.8, her pulse 102, respiratory rate 14, blood pressure 122/78, weight is 129 pounds.   Head and Neck:  Normocephalic, atraumatic skull.  She has no ocular or oral lesions.  There are no palpable cervical or supraclavicular lymph nodes. Lungs:  Clear bilaterally.  Cardiac:  Regular rate and rhythm with a normal S1 and S2.  There are no murmurs, rubs, or bruits.  Abdomen: Soft.  She has good bowel sounds.  There is no fluid wave.  There is no palpable abdominal mass.  There is no palpable hepatosplenomegaly. Breasts:  Right chest wall with no masses, erythema, or edema.  There is no right axillary adenopathy.  Left breast is unremarkable.  There is no left axillary adenopathy.  Back:  Some slight kyphosis.  She has no tenderness over the spine, ribs, or hips.  Extremities:  No clubbing, cyanosis, or edema.  She has age-related osteoarthritic changes in her joints.  Her muscle strength is 4+/5 bilaterally.  Neurological:  No focal neurological deficits.  LABORATORY STUDIES:  White cell count is 8.7, hemoglobin 9.7, hematocrit 30.9, platelet count 173.  Her BUN is 22, creatinine 1.0.  Calcium 9.2 with an albumin of 3.3.  IMPRESSION:  Ms. Petron is a 80 year old white female.  She has 2 separate malignancies.  She presented with both about the same time. She has IgA kappa myeloma.  She did not really respond to  Velcade/Cytoxan.  She had a hard time with this.  She also could not afford the Revlimid at that time.  She now is able to get the Revlimid.  I did believe that it is working and she is tolerating it well.  I find that her levels keep coming down, then we might be able to decrease her Revlimid dose to 15 mg a day.  As far as her breast cancer is concerned, I do not see any evidence of recurrence.  She is not a chemotherapy candidate for adjuvant therapy.  As such, we do have her on Herceptin and Perjeta.  I just think that a combination of the 2 is probably more effective then just Herceptin alone.  She will get Aranesp today.  She will get her Zometa  today.  We will plan to get her back in 3 weeks or so.  I spent a good half-hour with her today.  She came with her son.  I reviewed all the lab work with him.  I explained how things were going and why we need to keep her on the Revlimid.    ______________________________ [name], M.D. PRE/MEDQ  D:  11/13/2013  T:  11/14/2013  Job:  [id number]

## 2013-11-15 LAB — IGG, IGA, IGM
IgA: 1660 mg/dL — ABNORMAL HIGH (ref 69–380)
IgG (Immunoglobin G), Serum: 219 mg/dL — ABNORMAL LOW (ref 690–1700)
IgM, Serum: 24 mg/dL — ABNORMAL LOW (ref 52–322)

## 2013-11-15 LAB — PROTEIN ELECTROPHORESIS, SERUM, WITH REFLEX
ALPHA-1-GLOBULIN: 4.5 % (ref 2.9–4.9)
Albumin ELP: 50.8 % — ABNORMAL LOW (ref 55.8–66.1)
Alpha-2-Globulin: 11 % (ref 7.1–11.8)
BETA 2: 24.8 % — AB (ref 3.2–6.5)
Beta Globulin: 6.8 % (ref 4.7–7.2)
GAMMA GLOBULIN: 2.1 % — AB (ref 11.1–18.8)
M-Spike, %: 1.42 g/dL
Total Protein, Serum Electrophoresis: 7.4 g/dL (ref 6.0–8.3)

## 2013-11-15 LAB — IFE INTERPRETATION

## 2013-11-15 LAB — KAPPA/LAMBDA LIGHT CHAINS
Kappa free light chain: 15.7 mg/dL — ABNORMAL HIGH (ref 0.33–1.94)
Kappa:Lambda Ratio: 11.13 — ABNORMAL HIGH (ref 0.26–1.65)
LAMBDA FREE LGHT CHN: 1.41 mg/dL (ref 0.57–2.63)

## 2013-11-16 ENCOUNTER — Telehealth: Payer: Self-pay | Admitting: Nurse Practitioner

## 2013-11-16 NOTE — Telephone Encounter (Addendum)
Message copied by Jimmy Footman on Thu Nov 16, 2013 10:21 AM ------      Message from: Burney Gauze R      Created: Wed Nov 15, 2013  9:37 PM       Call her son - Myeloma now down to 1.42!!  I would NOT change her dose of Revlimid right now. Pete ------Spoke to son and he verbalized understanding and appreciation.

## 2013-11-21 ENCOUNTER — Ambulatory Visit (HOSPITAL_COMMUNITY)
Admission: RE | Admit: 2013-11-21 | Discharge: 2013-11-21 | Disposition: A | Discharge status: Home / Self Care | Payer: Medicare Other | Source: Ambulatory Visit | Attending: Hematology & Oncology | Admitting: Hematology & Oncology

## 2013-11-21 DIAGNOSIS — I2789 Other specified pulmonary heart diseases: Secondary | ICD-10-CM | POA: Insufficient documentation

## 2013-11-21 DIAGNOSIS — C773 Secondary and unspecified malignant neoplasm of axilla and upper limb lymph nodes: Secondary | ICD-10-CM

## 2013-11-21 DIAGNOSIS — C50919 Malignant neoplasm of unspecified site of unspecified female breast: Secondary | ICD-10-CM | POA: Insufficient documentation

## 2013-11-21 DIAGNOSIS — I059 Rheumatic mitral valve disease, unspecified: Secondary | ICD-10-CM | POA: Diagnosis not present

## 2013-11-21 DIAGNOSIS — I517 Cardiomegaly: Secondary | ICD-10-CM | POA: Insufficient documentation

## 2013-11-21 DIAGNOSIS — Z79899 Other long term (current) drug therapy: Secondary | ICD-10-CM | POA: Insufficient documentation

## 2013-11-22 ENCOUNTER — Other Ambulatory Visit: Payer: Self-pay | Admitting: Nurse Practitioner

## 2013-11-22 ENCOUNTER — Other Ambulatory Visit: Payer: Self-pay | Admitting: Hematology & Oncology

## 2013-11-22 DIAGNOSIS — C9000 Multiple myeloma not having achieved remission: Secondary | ICD-10-CM

## 2013-11-22 MED ORDER — LENALIDOMIDE 20 MG PO CAPS: 20.0000 mg | ORAL_CAPSULE | Freq: Every morning | ORAL | Status: DC

## 2013-11-29 ENCOUNTER — Other Ambulatory Visit: Payer: Self-pay | Admitting: *Deleted

## 2013-11-29 DIAGNOSIS — C9000 Multiple myeloma not having achieved remission: Secondary | ICD-10-CM

## 2013-11-30 ENCOUNTER — Encounter: Payer: Self-pay | Admitting: Hematology & Oncology

## 2013-11-30 ENCOUNTER — Ambulatory Visit (HOSPITAL_BASED_OUTPATIENT_CLINIC_OR_DEPARTMENT_OTHER): Payer: Medicare Other

## 2013-11-30 ENCOUNTER — Other Ambulatory Visit: Payer: Self-pay | Admitting: *Deleted

## 2013-11-30 ENCOUNTER — Ambulatory Visit (HOSPITAL_BASED_OUTPATIENT_CLINIC_OR_DEPARTMENT_OTHER): Payer: Medicare Other | Admitting: Hematology & Oncology

## 2013-11-30 ENCOUNTER — Encounter: Payer: Self-pay | Admitting: *Deleted

## 2013-11-30 ENCOUNTER — Other Ambulatory Visit: Payer: Medicare Other | Admitting: Lab

## 2013-11-30 VITALS — BP 125/60 | HR 86 | Temp 98.1°F | Resp 14 | Ht 66.0 in | Wt 130.0 lb

## 2013-11-30 DIAGNOSIS — C9001 Multiple myeloma in remission: Secondary | ICD-10-CM

## 2013-11-30 DIAGNOSIS — C9000 Multiple myeloma not having achieved remission: Secondary | ICD-10-CM

## 2013-11-30 DIAGNOSIS — N289 Disorder of kidney and ureter, unspecified: Secondary | ICD-10-CM | POA: Diagnosis not present

## 2013-11-30 DIAGNOSIS — C773 Secondary and unspecified malignant neoplasm of axilla and upper limb lymph nodes: Secondary | ICD-10-CM

## 2013-11-30 DIAGNOSIS — C50919 Malignant neoplasm of unspecified site of unspecified female breast: Secondary | ICD-10-CM | POA: Diagnosis not present

## 2013-11-30 DIAGNOSIS — D649 Anemia, unspecified: Secondary | ICD-10-CM | POA: Diagnosis not present

## 2013-11-30 LAB — CBC WITH DIFFERENTIAL (CANCER CENTER ONLY)
BASO#: 0 10*3/uL (ref 0.0–0.2)
BASO%: 0.9 % (ref 0.0–2.0)
EOS ABS: 0 10*3/uL (ref 0.0–0.5)
EOS%: 0.4 % (ref 0.0–7.0)
HCT: 28 % — ABNORMAL LOW (ref 34.8–46.6)
HGB: 8.9 g/dL — ABNORMAL LOW (ref 11.6–15.9)
LYMPH#: 0.5 10*3/uL — AB (ref 0.9–3.3)
LYMPH%: 11.2 % — ABNORMAL LOW (ref 14.0–48.0)
MCH: 32.6 pg (ref 26.0–34.0)
MCHC: 31.8 g/dL — ABNORMAL LOW (ref 32.0–36.0)
MCV: 103 fL — ABNORMAL HIGH (ref 81–101)
MONO#: 0.7 10*3/uL (ref 0.1–0.9)
MONO%: 15.9 % — AB (ref 0.0–13.0)
NEUT%: 71.6 % (ref 39.6–80.0)
NEUTROS ABS: 3.3 10*3/uL (ref 1.5–6.5)
Platelets: 237 10*3/uL (ref 145–400)
RBC: 2.73 10*6/uL — ABNORMAL LOW (ref 3.70–5.32)
RDW: 16.2 % — ABNORMAL HIGH (ref 11.1–15.7)
WBC: 4.5 10*3/uL (ref 3.9–10.0)

## 2013-11-30 LAB — COMPREHENSIVE METABOLIC PANEL
ALBUMIN: 3.5 g/dL (ref 3.5–5.2)
ALT: 18 U/L (ref 0–35)
AST: 18 U/L (ref 0–37)
Alkaline Phosphatase: 70 U/L (ref 39–117)
BUN: 14 mg/dL (ref 6–23)
CALCIUM: 8.1 mg/dL — AB (ref 8.4–10.5)
CHLORIDE: 102 meq/L (ref 96–112)
CO2: 21 mEq/L (ref 19–32)
Creatinine, Ser: 1.13 mg/dL — ABNORMAL HIGH (ref 0.50–1.10)
GLUCOSE: 90 mg/dL (ref 70–99)
Potassium: 2.7 mEq/L — CL (ref 3.5–5.3)
Sodium: 136 mEq/L (ref 135–145)
Total Bilirubin: 0.4 mg/dL (ref 0.2–1.2)
Total Protein: 6.8 g/dL (ref 6.0–8.3)

## 2013-11-30 LAB — TECHNOLOGIST REVIEW CHCC SATELLITE

## 2013-11-30 MED ORDER — CARVEDILOL 3.125 MG PO TABS: 3.1250 mg | ORAL_TABLET | Freq: Two times a day (BID) | ORAL | Status: DC

## 2013-11-30 MED ORDER — POTASSIUM CHLORIDE CRYS ER 20 MEQ PO TBCR: EXTENDED_RELEASE_TABLET | ORAL | Status: DC

## 2013-11-30 MED ORDER — FENTANYL 12 MCG/HR TD PT72: 12.5000 ug | MEDICATED_PATCH | TRANSDERMAL | Status: DC | PRN

## 2013-11-30 MED ORDER — DARBEPOETIN ALFA-POLYSORBATE 300 MCG/0.6ML IJ SOLN
300.0000 ug | Freq: Once | INTRAMUSCULAR | Status: AC
  Administered 2013-11-30: 300 ug via SUBCUTANEOUS

## 2013-11-30 MED ORDER — DARBEPOETIN ALFA-POLYSORBATE 300 MCG/0.6ML IJ SOLN
INTRAMUSCULAR | Status: AC
  Filled 2013-11-30: qty 0.6

## 2013-11-30 NOTE — Patient Instructions (Signed)
Darbepoetin Alfa injection What is this medicine? DARBEPOETIN ALFA (dar be POE e tin AL fa) helps your body make more red blood cells. It is used to treat anemia caused by chronic kidney failure and chemotherapy. This medicine may be used for other purposes; ask your health care provider or pharmacist if you have questions. COMMON BRAND NAME(S): Aranesp What should I tell my health care provider before I take this medicine? They need to know if you have any of these conditions: -blood clotting disorders or history of blood clots -cancer patient not on chemotherapy -cystic fibrosis -heart disease, such as angina, heart failure, or a history of a heart attack -hemoglobin level of 12 g/dL or greater -high blood pressure -low levels of folate, iron, or vitamin B12 -seizures -an unusual or allergic reaction to darbepoetin, erythropoietin, albumin, hamster proteins, latex, other medicines, foods, dyes, or preservatives -pregnant or trying to get pregnant -breast-feeding How should I use this medicine? This medicine is for injection into a vein or under the skin. It is usually given by a health care professional in a hospital or clinic setting. If you get this medicine at home, you will be taught how to prepare and give this medicine. Do not shake the solution before you withdraw a dose. Use exactly as directed. Take your medicine at regular intervals. Do not take your medicine more often than directed. It is important that you put your used needles and syringes in a special sharps container. Do not put them in a trash can. If you do not have a sharps container, call your pharmacist or healthcare provider to get one. Talk to your pediatrician regarding the use of this medicine in children. While this medicine may be used in children as young as 1 year for selected conditions, precautions do apply. Overdosage: If you think you have taken too much of this medicine contact a poison control center or  emergency room at once. NOTE: This medicine is only for you. Do not share this medicine with others. What if I miss a dose? If you miss a dose, take it as soon as you can. If it is almost time for your next dose, take only that dose. Do not take double or extra doses. What may interact with this medicine? Do not take this medicine with any of the following medications: -epoetin alfa This list may not describe all possible interactions. Give your health care provider a list of all the medicines, herbs, non-prescription drugs, or dietary supplements you use. Also tell them if you smoke, drink alcohol, or use illegal drugs. Some items may interact with your medicine. What should I watch for while using this medicine? Visit your prescriber or health care professional for regular checks on your progress and for the needed blood tests and blood pressure measurements. It is especially important for the doctor to make sure your hemoglobin level is in the desired range, to limit the risk of potential side effects and to give you the best benefit. Keep all appointments for any recommended tests. Check your blood pressure as directed. Ask your doctor what your blood pressure should be and when you should contact him or her. As your body makes more red blood cells, you may need to take iron, folic acid, or vitamin B supplements. Ask your doctor or health care provider which products are right for you. If you have kidney disease continue dietary restrictions, even though this medication can make you feel better. Talk with your doctor or health   care professional about the foods you eat and the vitamins that you take. What side effects may I notice from receiving this medicine? Side effects that you should report to your doctor or health care professional as soon as possible: -allergic reactions like skin rash, itching or hives, swelling of the face, lips, or tongue -breathing problems -changes in vision -chest  pain -confusion, trouble speaking or understanding -feeling faint or lightheaded, falls -high blood pressure -muscle aches or pains -pain, swelling, warmth in the leg -rapid weight gain -severe headaches -sudden numbness or weakness of the face, arm or leg -trouble walking, dizziness, loss of balance or coordination -seizures (convulsions) -swelling of the ankles, feet, hands -unusually weak or tired Side effects that usually do not require medical attention (report to your doctor or health care professional if they continue or are bothersome): -diarrhea -fever, chills (flu-like symptoms) -headaches -nausea, vomiting -redness, stinging, or swelling at site where injected This list may not describe all possible side effects. Call your doctor for medical advice about side effects. You may report side effects to FDA at 1-800-FDA-1088. Where should I keep my medicine? Keep out of the reach of children. Store in a refrigerator between 2 and 8 degrees C (36 and 46 degrees F). Do not freeze. Do not shake. Throw away any unused portion if using a single-dose vial. Throw away any unused medicine after the expiration date. NOTE: This sheet is a summary. It may not cover all possible information. If you have questions about this medicine, talk to your doctor, pharmacist, or health care provider.  2014, Elsevier/Gold Standard. (2008-09-18 10:23:57)  

## 2013-11-30 NOTE — Progress Notes (Signed)
Per Dr. Marin Olp, pt's potassium is 2.7 and she needs to take potassium tabs BID for 7 days then 1 tab daily.  Unable to reach pt.  Left message on pt's home phone and called her son Jacqueline Rivas with this message.  He stated he will try to reach him Mom and be sure she got this message.

## 2013-11-30 NOTE — Telephone Encounter (Signed)
Erroneous encounter

## 2013-11-30 NOTE — Progress Notes (Signed)
Hematology and Oncology Follow Up Visit  Uzma Hellmer [id number] Mar 25, 1932 80 y.o. 11/30/2013   Principle Diagnosis:   #1 IgA kappa myeloma  #2 stage IIb (T2 N1M0) ductal carcinoma the right breast (ER negative and HER-2 positive ( #3 anemia secondary to renal insufficiency and myeloma  #4 congestive heart failure, likely secondary to Herceptin Current Therapy:    Revlimid 20 mg by mouth daily. #2 Zometa 4 mg IV every 3 weeks. #3 Aranesp 300 mcg 3 level less than 10     Interim History: Mrs. Germer is is in for followup. Unfortunately, we did a echocardiogram on her, we got her ejection fraction I got down to 20/25%. I can only think of the Herceptin as a cause for this. We will subsequently stopped the Herceptin and Perjeta.  She still feels tired. We will have to see about a cardiology referral. I would've put her on Coreg.  The Revlimid is working. Her monoclonal spike is now down to 1.42 g/ deciliter. Her IgA level and Kappa light chains are improving.  She's not having as much pain. She still is on the Duragesic patches.  Her appetite is doing okay. She seems to be eating okay.  Currently, her performance status is ECOG 2.  Medications: Current outpatient prescriptions:calcium carbonate (OS-CAL - DOSED IN MG OF ELEMENTAL CALCIUM) 1250 MG tablet, Take 1,250 mg by mouth daily as needed (on occasion). , Disp: , Rfl: ;  Coenzyme Q10 (CO Q-10) 100 MG CAPS, Take 100 mg by mouth daily as needed (on occasion). , Disp: , Rfl: ;  dexamethasone (DECADRON) 4 MG tablet, Take 5 pills once a WEEK with food., Disp: 60 tablet, Rfl: 2 famciclovir (FAMVIR) 250 MG tablet, Take 1 a day to prevent shingles., Disp: 30 tablet, Rfl: 6;  fentaNYL (DURAGESIC - DOSED MCG/HR) 12 MCG/HR, Place 1 patch (12.5 mcg total) onto the skin as needed., Disp: 10 patch, Rfl: 0;  HYDROmorphone (DILAUDID) 2 MG tablet, Take 1 tablet (2 mg total) by mouth every 6 (six) hours as needed for pain (takes along with fentanyl  patch)., Disp: 60 tablet, Rfl: 0 lansoprazole (PREVACID) 30 MG capsule, Take 1 capsule (30 mg total) by mouth daily., Disp: 30 capsule, Rfl: 4;  Lenalidomide (REVLIMID) 20 MG CAPS, Take 20 mg by mouth every morning. 21 days on 7 days off. Auth # [id number], Disp: 21 capsule, Rfl: 0;  magnesium oxide (MAG-OX) 400 MG tablet, Take 400 mg by mouth daily as needed (constipation)., Disp: , Rfl:  ondansetron (ZOFRAN) 8 MG tablet, Take 1 tablet (8 mg total) by mouth every 12 (twelve) hours as needed for nausea., Disp: 20 tablet, Rfl: 0;  polyethylene glycol (MIRALAX / GLYCOLAX) packet, Take 17 g by mouth 2 (two) times daily as needed. For constipation, Disp: , Rfl: ;  prochlorperazine (COMPAZINE) 10 MG tablet, Take 1 tablet (10 mg total) by mouth every 6 (six) hours as needed., Disp: 30 tablet, Rfl: 0 triamterene-hydrochlorothiazide (MAXZIDE-25) 37.5-25 MG per tablet, TAKE 1 TABLET BY MOUTH ONCE DAILY, Disp: 90 tablet, Rfl: 2;  Zinc 50 MG CAPS, Take 50 mg by mouth daily as needed (on occasion)., Disp: , Rfl: ;  carvedilol (COREG) 3.125 MG tablet, Take 1 tablet (3.125 mg total) by mouth 2 (two) times daily with a meal., Disp: 60 tablet, Rfl: 3 potassium chloride SA (K-DUR,KLOR-CON) 20 MEQ tablet, Take 1 pill twice a day for 7 days, then take 1 pill a day., Disp: 60 tablet, Rfl: 2  Allergies: No Known Allergies  Past  Medical History, Surgical history, Social history, and Family History were reviewed and updated.  Review of Systems: As above  Physical Exam:  height is 5' 6"  (1.676 m) and weight is 130 lb (58.968 kg). Her oral temperature is 98.1 F (36.7 C). Her blood pressure is 125/60 and her pulse is 86. Her respiration is 14.   Elderly white female in no obvious distress. Her head and exam shows no other oral lesions. There is no palpable cervical or supraclavicular lymph nodes. Lungs are clear. Chest wall exam shows right mastectomy. No nodules are noted. No right axillary lymph nodes are noted. Left breast  is unremarkable. Cardiac exam regular rate and rhythm with no murmurs rubs or bruits. Abdomen soft. His good bowel sounds. There is no fluid wave. Back exam shows some kyphosis. No tenderness over the spine. Extremities shows some trace edema. She has decent strength in her legs. She is osteoarthritic changes in her joints. Skin exam no rashes. Neurological exam no focal neurological deficits.  Lab Results  Component Value Date   WBC 4.5 11/30/2013   HGB 8.9* 11/30/2013   HCT 28.0* 11/30/2013   MCV 103* 11/30/2013   PLT 237 11/30/2013     Chemistry      Component Value Date/Time   NA 136 11/30/2013 0824   NA 135 11/13/2013 1148   K 2.7* 11/30/2013 0824   K 3.6 11/13/2013 1148   CL 102 11/30/2013 0824   CL 104 11/13/2013 1148   CO2 21 11/30/2013 0824   CO2 25 11/13/2013 1148   BUN 14 11/30/2013 0824   BUN 22 11/13/2013 1148   CREATININE 1.13* 11/30/2013 0824   CREATININE 1.2 11/13/2013 1148      Component Value Date/Time   CALCIUM 8.1* 11/30/2013 0824   CALCIUM 9.2 11/13/2013 1148   CALCIUM 11.1* 11/13/2012 1526   ALKPHOS 70 11/30/2013 0824   ALKPHOS 61 11/13/2013 1148   AST 18 11/30/2013 0824   AST 31 11/13/2013 1148   ALT 18 11/30/2013 0824   ALT 42 11/13/2013 1148   BILITOT 0.4 11/30/2013 0824   BILITOT 0.80 11/13/2013 1148         Impression and Plan: Mrs.Lenore Moyano is a 80 year old white female. She has 2 separate malignancies. The problem right now is the decreased ejection fraction. Again, I have to believe this is the Herceptin. We will stop this. We'll see what cardiology has to say.  We'll go ahead with her Zometa. This will continue to help with her myeloma. I also think it will help with her breast cancer.  She will continue the Revlimid. I think she's done well with this. We are seeing we do a nice response.  I spent a good 45 minutes with her and her son. I explained what was going on. I told him that I thought that it might take 3 months or so before we could see a improvement in  her cardiac function.  I will see her back in 3 weeks.  Cassell Smiles, MD 2/12/20156:33 PM

## 2013-12-20 ENCOUNTER — Other Ambulatory Visit: Payer: Self-pay | Admitting: *Deleted

## 2013-12-20 DIAGNOSIS — C9000 Multiple myeloma not having achieved remission: Secondary | ICD-10-CM

## 2013-12-20 MED ORDER — LENALIDOMIDE 20 MG PO CAPS: 20.0000 mg | ORAL_CAPSULE | Freq: Every morning | ORAL | Status: DC

## 2013-12-21 ENCOUNTER — Encounter: Payer: Self-pay | Admitting: Hematology & Oncology

## 2013-12-21 ENCOUNTER — Telehealth: Payer: Self-pay | Admitting: *Deleted

## 2013-12-21 ENCOUNTER — Ambulatory Visit (HOSPITAL_BASED_OUTPATIENT_CLINIC_OR_DEPARTMENT_OTHER): Payer: Medicare Other

## 2013-12-21 ENCOUNTER — Other Ambulatory Visit (HOSPITAL_BASED_OUTPATIENT_CLINIC_OR_DEPARTMENT_OTHER): Payer: Medicare Other | Admitting: Lab

## 2013-12-21 ENCOUNTER — Ambulatory Visit (HOSPITAL_BASED_OUTPATIENT_CLINIC_OR_DEPARTMENT_OTHER): Payer: Medicare Other | Admitting: Hematology & Oncology

## 2013-12-21 VITALS — BP 134/71 | HR 98 | Temp 97.7°F | Resp 14 | Ht 65.0 in | Wt 123.0 lb

## 2013-12-21 DIAGNOSIS — C9000 Multiple myeloma not having achieved remission: Secondary | ICD-10-CM

## 2013-12-21 DIAGNOSIS — R197 Diarrhea, unspecified: Secondary | ICD-10-CM

## 2013-12-21 DIAGNOSIS — D649 Anemia, unspecified: Secondary | ICD-10-CM

## 2013-12-21 DIAGNOSIS — N289 Disorder of kidney and ureter, unspecified: Secondary | ICD-10-CM | POA: Diagnosis not present

## 2013-12-21 DIAGNOSIS — D630 Anemia in neoplastic disease: Secondary | ICD-10-CM

## 2013-12-21 LAB — CBC WITH DIFFERENTIAL (CANCER CENTER ONLY)
BASO#: 0 10*3/uL (ref 0.0–0.2)
BASO%: 0 % (ref 0.0–2.0)
EOS ABS: 0 10*3/uL (ref 0.0–0.5)
EOS%: 0.2 % (ref 0.0–7.0)
HEMATOCRIT: 29.5 % — AB (ref 34.8–46.6)
HEMOGLOBIN: 9.3 g/dL — AB (ref 11.6–15.9)
LYMPH#: 0.5 10*3/uL — AB (ref 0.9–3.3)
LYMPH%: 11.4 % — ABNORMAL LOW (ref 14.0–48.0)
MCH: 31.8 pg (ref 26.0–34.0)
MCHC: 31.5 g/dL — AB (ref 32.0–36.0)
MCV: 101 fL (ref 81–101)
MONO#: 0.1 10*3/uL (ref 0.1–0.9)
MONO%: 3.2 % (ref 0.0–13.0)
NEUT%: 85.2 % — AB (ref 39.6–80.0)
NEUTROS ABS: 3.5 10*3/uL (ref 1.5–6.5)
Platelets: 146 10*3/uL (ref 145–400)
RBC: 2.92 10*6/uL — AB (ref 3.70–5.32)
RDW: 16.4 % — ABNORMAL HIGH (ref 11.1–15.7)
WBC: 4.1 10*3/uL (ref 3.9–10.0)

## 2013-12-21 LAB — CMP (CANCER CENTER ONLY)
ALK PHOS: 73 U/L (ref 26–84)
ALT: 26 U/L (ref 10–47)
AST: 29 U/L (ref 11–38)
Albumin: 3.2 g/dL — ABNORMAL LOW (ref 3.3–5.5)
BILIRUBIN TOTAL: 0.6 mg/dL (ref 0.20–1.60)
BUN, Bld: 23 mg/dL — ABNORMAL HIGH (ref 7–22)
CO2: 23 meq/L (ref 18–33)
CREATININE: 1.2 mg/dL (ref 0.6–1.2)
Calcium: 9.6 mg/dL (ref 8.0–10.3)
Chloride: 105 mEq/L (ref 98–108)
Glucose, Bld: 124 mg/dL — ABNORMAL HIGH (ref 73–118)
Potassium: 3.2 mEq/L — ABNORMAL LOW (ref 3.3–4.7)
SODIUM: 137 meq/L (ref 128–145)
Total Protein: 7.5 g/dL (ref 6.4–8.1)

## 2013-12-21 MED ORDER — DIPHENOXYLATE-ATROPINE 2.5-0.025 MG PO TABS: 1.0000 | ORAL_TABLET | Freq: Four times a day (QID) | ORAL | Status: DC | PRN

## 2013-12-21 MED ORDER — DARBEPOETIN ALFA-POLYSORBATE 300 MCG/0.6ML IJ SOLN
INTRAMUSCULAR | Status: AC
  Filled 2013-12-21: qty 0.6

## 2013-12-21 MED ORDER — DARBEPOETIN ALFA-POLYSORBATE 300 MCG/0.6ML IJ SOLN
300.0000 ug | Freq: Once | INTRAMUSCULAR | Status: AC
Start: 2013-12-21 — End: 2013-12-21
  Administered 2013-12-21: 300 ug via SUBCUTANEOUS

## 2013-12-21 MED ORDER — ZOLEDRONIC ACID 4 MG/5ML IV CONC
3.0000 mg | Freq: Once | INTRAVENOUS | Status: AC
  Administered 2013-12-21: 3 mg via INTRAVENOUS
  Filled 2013-12-21: qty 3.75

## 2013-12-21 MED ORDER — HEPARIN SOD (PORK) LOCK FLUSH 100 UNIT/ML IV SOLN
250.0000 [IU] | Freq: Once | INTRAVENOUS | Status: DC | PRN
  Filled 2013-12-21: qty 5

## 2013-12-21 MED ORDER — SODIUM CHLORIDE 0.9 % IJ SOLN
3.0000 mL | Freq: Once | INTRAMUSCULAR | Status: DC | PRN
Start: 2013-12-21 — End: 2013-12-21
  Filled 2013-12-21: qty 10

## 2013-12-21 MED ORDER — SODIUM CHLORIDE 0.9 % IV SOLN
INTRAVENOUS | Status: DC
  Administered 2013-12-21: 13:00:00 via INTRAVENOUS

## 2013-12-21 MED ORDER — SODIUM CHLORIDE 0.9 % IJ SOLN
10.0000 mL | INTRAMUSCULAR | Status: DC | PRN
  Filled 2013-12-21: qty 10

## 2013-12-21 MED ORDER — HEPARIN SOD (PORK) LOCK FLUSH 100 UNIT/ML IV SOLN
500.0000 [IU] | Freq: Once | INTRAVENOUS | Status: DC | PRN
  Filled 2013-12-21: qty 5

## 2013-12-21 MED ORDER — FAMCICLOVIR 500 MG PO TABS: ORAL_TABLET | ORAL | Status: DC

## 2013-12-21 MED ORDER — ALTEPLASE 2 MG IJ SOLR
2.0000 mg | Freq: Once | INTRAMUSCULAR | Status: DC | PRN
  Filled 2013-12-21: qty 2

## 2013-12-21 NOTE — Patient Instructions (Signed)
Dehydration, Adult Dehydration means your body does not have as much fluid as it needs. Your kidneys, brain, and heart will not work properly without the right amount of fluids and salt.  HOME CARE  Ask your doctor how to replace body fluid losses (rehydrate).  Drink enough fluids to keep your pee (urine) clear or pale yellow.  Drink small amounts of fluids often if you feel sick to your stomach (nauseous) or throw up (vomit).  Eat like you normally do.  Avoid:  Foods or drinks high in sugar.  Bubbly (carbonated) drinks.  Juice.  Very hot or cold fluids.  Drinks with caffeine.  Fatty, greasy foods.  Alcohol.  Tobacco.  Eating too much.  Gelatin desserts.  Wash your hands to avoid spreading germs (bacteria, viruses).  Only take medicine as told by your doctor.  Keep all doctor visits as told. GET HELP RIGHT AWAY IF:   You cannot drink something without throwing up.  You get worse even with treatment.  Your vomit has blood in it or looks greenish.  Your poop (stool) has blood in it or looks black and tarry.  You have not peed in 6 to 8 hours.  You pee a small amount of very dark pee.  You have a fever.  You pass out (faint).  You have belly (abdominal) pain that gets worse or stays in one spot (localizes).  You have a rash, stiff neck, or bad headache.  You get easily annoyed, sleepy, or are hard to wake up.  You feel weak, dizzy, or very thirsty. MAKE SURE YOU:   Understand these instructions.  Will watch your condition.  Will get help right away if you are not doing well or get worse. Document Released: 08/01/2009 Document Revised: 12/28/2011 Document Reviewed: 05/25/2011 [facility] Patient Information 2014 [place], [place]. Zoledronic Acid injection (Hypercalcemia, Oncology) What is this medicine? ZOLEDRONIC ACID (ZOE le dron ik AS id) lowers the amount of calcium loss from bone. It is used to treat too much calcium in your blood from cancer.  It is also used to prevent complications of cancer that has spread to the bone. This medicine may be used for other purposes; ask your health care provider or pharmacist if you have questions. COMMON BRAND NAME(S): Zometa What should I tell my health care provider before I take this medicine? They need to know if you have any of these conditions: -aspirin-sensitive asthma -cancer, especially if you are receiving medicines used to treat cancer -dental disease or wear dentures -infection -kidney disease -receiving corticosteroids like dexamethasone or prednisone -an unusual or allergic reaction to zoledronic acid, other medicines, foods, dyes, or preservatives -pregnant or trying to get pregnant -breast-feeding How should I use this medicine? This medicine is for infusion into a vein. It is given by a health care professional in a hospital or clinic setting. Talk to your pediatrician regarding the use of this medicine in children. Special care may be needed. Overdosage: If you think you have taken too much of this medicine contact a poison control center or emergency room at once. NOTE: This medicine is only for you. Do not share this medicine with others. What if I miss a dose? It is important not to miss your dose. Call your doctor or health care professional if you are unable to keep an appointment. What may interact with this medicine? -certain antibiotics given by injection -NSAIDs, medicines for pain and inflammation, like ibuprofen or naproxen -some diuretics like bumetanide, furosemide -teriparatide -thalidomide This  list may not describe all possible interactions. Give your health care provider a list of all the medicines, herbs, non-prescription drugs, or dietary supplements you use. Also tell them if you smoke, drink alcohol, or use illegal drugs. Some items may interact with your medicine. What should I watch for while using this medicine? Visit your doctor or health care  professional for regular checkups. It may be some time before you see the benefit from this medicine. Do not stop taking your medicine unless your doctor tells you to. Your doctor may order blood tests or other tests to see how you are doing. Women should inform their doctor if they wish to become pregnant or think they might be pregnant. There is a potential for serious side effects to an unborn child. Talk to your health care professional or pharmacist for more information. You should make sure that you get enough calcium and vitamin D while you are taking this medicine. Discuss the foods you eat and the vitamins you take with your health care professional. Some people who take this medicine have severe bone, joint, and/or muscle pain. This medicine may also increase your risk for jaw problems or a broken thigh bone. Tell your doctor right away if you have severe pain in your jaw, bones, joints, or muscles. Tell your doctor if you have any pain that does not go away or that gets worse. Tell your dentist and dental surgeon that you are taking this medicine. You should not have major dental surgery while on this medicine. See your dentist to have a dental exam and fix any dental problems before starting this medicine. Take good care of your teeth while on this medicine. Make sure you see your dentist for regular follow-up appointments. What side effects may I notice from receiving this medicine? Side effects that you should report to your doctor or health care professional as soon as possible: -allergic reactions like skin rash, itching or hives, swelling of the face, lips, or tongue -anxiety, confusion, or depression -breathing problems -changes in vision -eye pain -feeling faint or lightheaded, falls -jaw pain, especially after dental work -mouth sores -muscle cramps, stiffness, or weakness -trouble passing urine or change in the amount of urine Side effects that usually do not require medical  attention (report to your doctor or health care professional if they continue or are bothersome): -bone, joint, or muscle pain -constipation -diarrhea -fever -hair loss -irritation at site where injected -loss of appetite -nausea, vomiting -stomach upset -trouble sleeping -trouble swallowing -weak or tired This list may not describe all possible side effects. Call your doctor for medical advice about side effects. You may report side effects to FDA at 1-800-FDA-1088. Where should I keep my medicine? This drug is given in a hospital or clinic and will not be stored at home. NOTE: This sheet is a summary. It may not cover all possible information. If you have questions about this medicine, talk to your doctor, pharmacist, or health care provider.  2014, Elsevier/Gold Standard. (2013-03-16 13:03:13)

## 2013-12-21 NOTE — Telephone Encounter (Signed)
Called [facility] to let them know that Dr. Marin Olp wants to hold Revlimid for 1 month due to patients diarrhea.  Refill request just sent in to pharmacy yesterday.Rx not sent to patient .

## 2013-12-21 NOTE — Progress Notes (Signed)
  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  Zometa 4 mg IV to 3 weeks  Aranesp 300 mcg subcutaneous as needed for hemoglobin less than 10   INTERIM HISTORY:  Jacqueline Rivas comes in. Is really hard to figure out what exactly she is doing and not doing. If she has diarrhea. She's had diarrhea for a long time. I don't know if this is worsened by the Revlimid. Again with her is hard to get what is exactly going on. I am not sure what she's taking for it. She's lost weight. She's not hurting. She has a Duragesic patch which she says she uses every 7 or 8 days.    As far as her myeloma is concerned, later. IgA level was 1660 mg/dL. Kappa light chain was 15.7 mg/dL.       PHYSICAL EXAMINATION:  Elderly, thin white female. Vital signs temperature 97.7. Blood pressure 134/71. Weight 123 pounds. Heart rate is 98. Head and exam shows no thrush. No adenopathy noted. Some temporal muscle wasting. Lungs are clear. Cardiac exam regular in rhythm. Chest wall looks like she has healing shingles. Again I don't know if she is taking her family her. Abdomen soft. Extremities trace edema. Skin shows a likely shingles rash in the right T3 dermatome.   LABORATORY STUDIES:  White cell count 4.1. Hemoglobin 9.3. Platelet count 146.   IMPRESSION:  Ig a Kappa myeloma. Responding to Revlimid. Unfortunately I do not know if the diarrhea is Revlimid based. We will stop the Relafen for one month.    It looks like shingles on the right chest wall. I will increase her Famvir.    We will give her fluids.    I gave her samples of Creon to see if that would help with the diarrhea. Will give her Lomotil.    I told her to take some pro biotic.    We will give her  back in one month. If the diarrhea is better, that we'll decrease Revlimid dose down to 15 mg.       [name], MD 12/21/2013

## 2013-12-25 LAB — PROTEIN ELECTROPHORESIS, SERUM
ALBUMIN ELP: 49 % — AB (ref 55.8–66.1)
ALPHA-1-GLOBULIN: 6.7 % — AB (ref 2.9–4.9)
Alpha-2-Globulin: 13.5 % — ABNORMAL HIGH (ref 7.1–11.8)
BETA 2: 22 % — AB (ref 3.2–6.5)
Beta Globulin: 6.3 % (ref 4.7–7.2)
Gamma Globulin: 2.5 % — ABNORMAL LOW (ref 11.1–18.8)
M-Spike, %: 1.06 g/dL
Total Protein, Serum Electrophoresis: 6.8 g/dL (ref 6.0–8.3)

## 2013-12-25 LAB — LACTATE DEHYDROGENASE: LDH: 120 U/L (ref 94–250)

## 2013-12-25 LAB — KAPPA/LAMBDA LIGHT CHAINS
KAPPA FREE LGHT CHN: 5.53 mg/dL — AB (ref 0.33–1.94)
KAPPA LAMBDA RATIO: 6.74 — AB (ref 0.26–1.65)
Lambda Free Lght Chn: 0.82 mg/dL (ref 0.57–2.63)

## 2013-12-25 LAB — IGG, IGA, IGM
IGA: 1580 mg/dL — AB (ref 69–380)
IGM, SERUM: 20 mg/dL — AB (ref 52–322)
IgG (Immunoglobin G), Serum: 268 mg/dL — ABNORMAL LOW (ref 690–1700)

## 2013-12-27 ENCOUNTER — Telehealth: Payer: Self-pay | Admitting: Nurse Practitioner

## 2013-12-27 NOTE — Telephone Encounter (Addendum)
Message copied by Jimmy Footman on Wed Dec 27, 2013 10:55 AM ------      Message from: [name]      Created: Tue Dec 26, 2013  4:19 PM       Call her son - myeloma is now down to 1.06!!!  We are getting there!!1  Laurey Arrow ------LVM on son's personal VM and advised. Instructed him to contact the office with any further questions and or concerns.

## 2014-01-04 ENCOUNTER — Encounter: Payer: Self-pay | Admitting: Hematology & Oncology

## 2014-01-15 IMAGING — US US EXTREM LOW VENOUS*R*
1 series · 14 of 23 positions shown · non-contrast
Comparison: None.

CLINICAL DATA: Right ankle pain and swelling.

EXAM:
RIGHT LOWER EXTREMITY VENOUS ULTRASOUND
TECHNIQUE: Gray-scale sonography with graded compression, as well as color
Doppler and duplex ultrasound, were performed to evaluate the deep
venous system from the level of the common femoral vein through the
popliteal and proximal calf veins. Spectral Doppler was utilized to
evaluate flow at rest and with distal augmentation maneuvers.

[Series 1: us extrem low venous*right* · 14 of 23 slices shown]
[im 1/23]
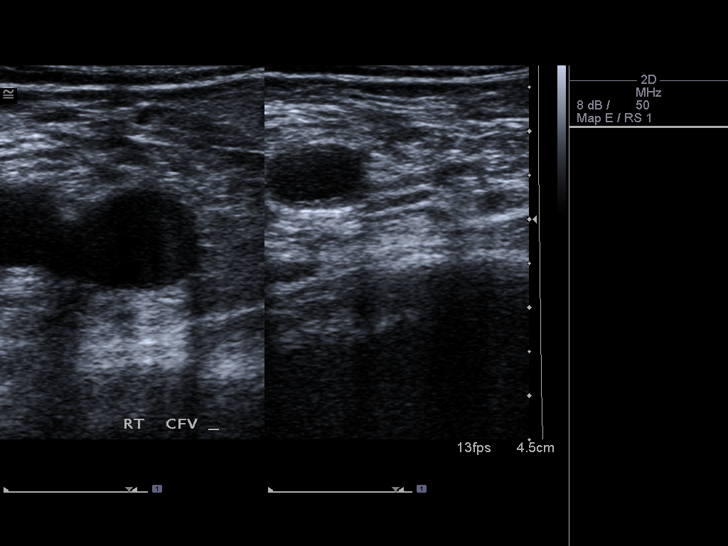
[im 3/23]
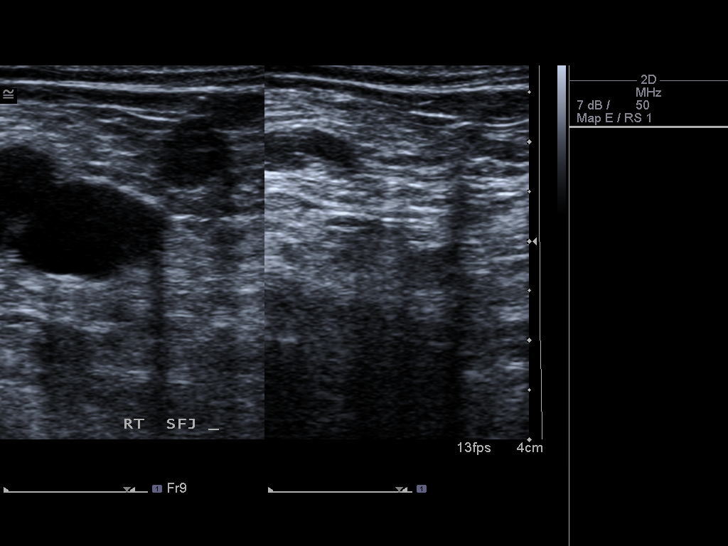
[im 5/23]
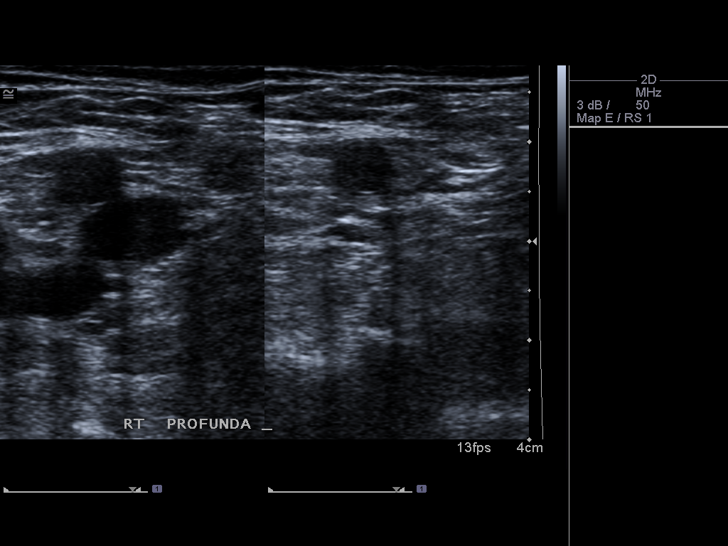
[im 6/23]
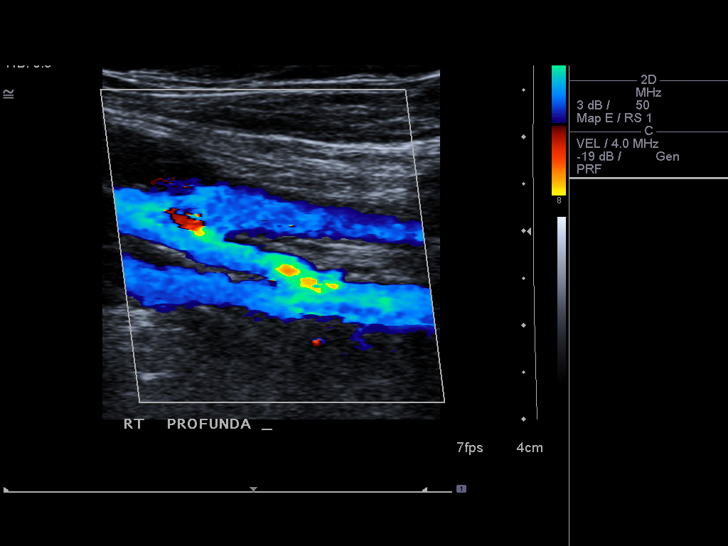
[im 8/23]
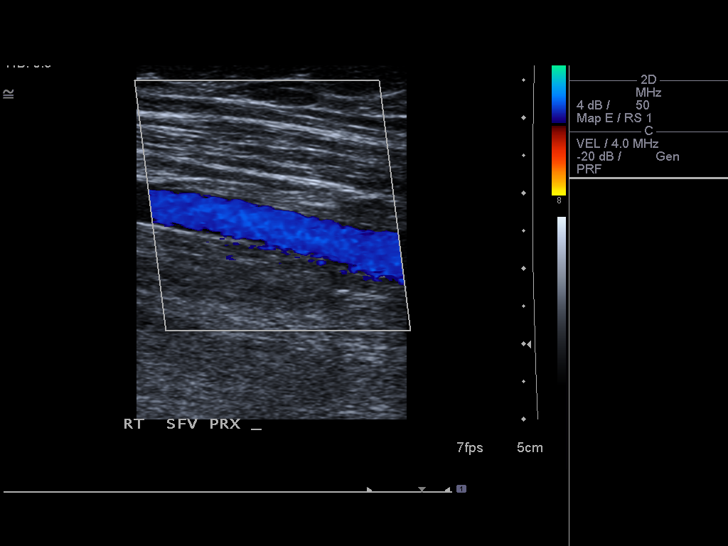
[im 10/23]
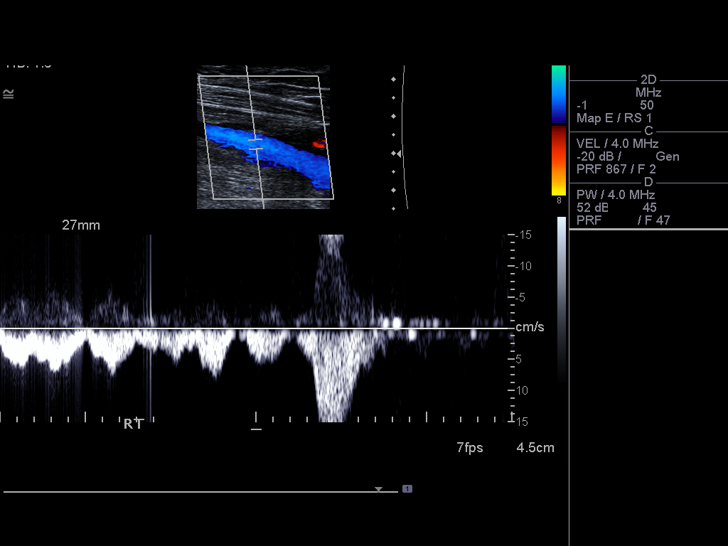
[im 11/23]
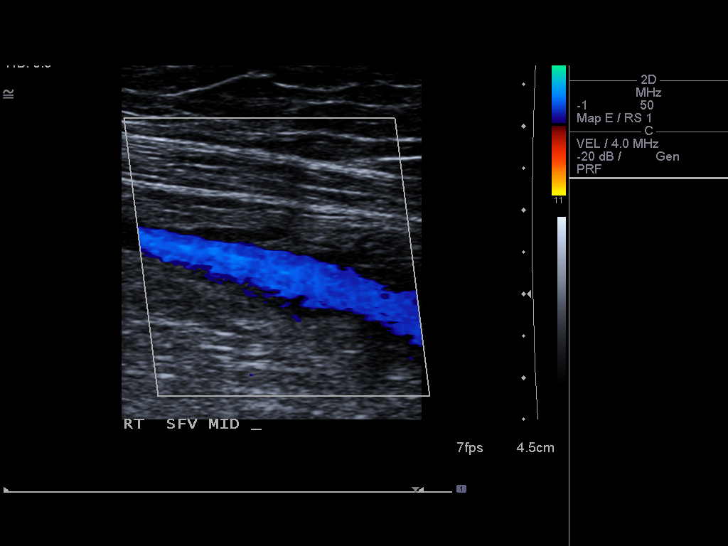
[im 13/23]
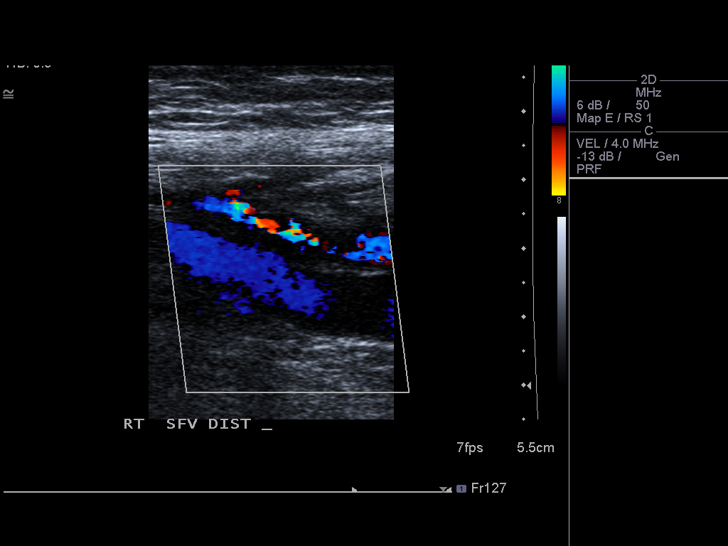
[im 14/23]
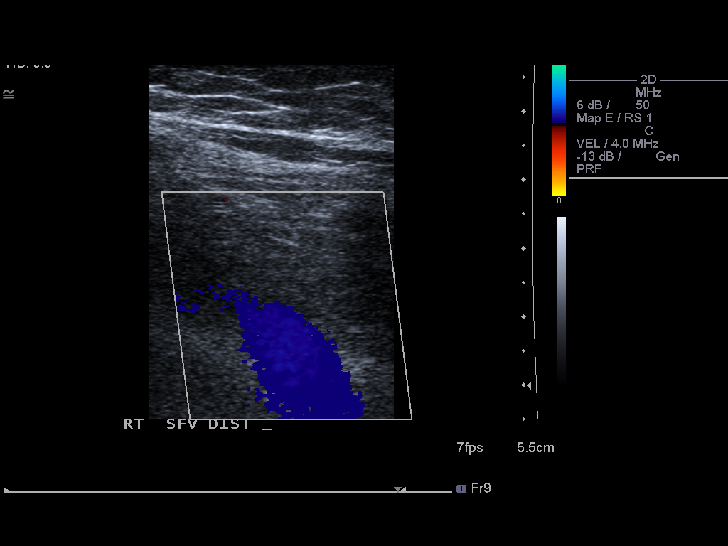
[im 16/23]
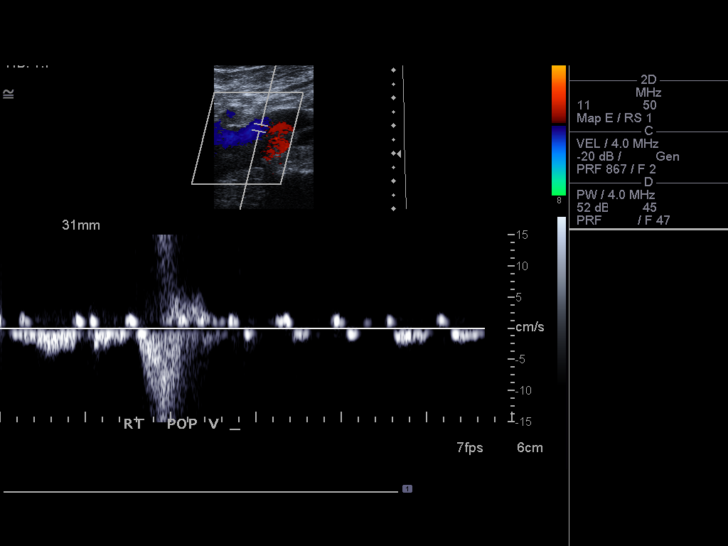
[im 18/23]
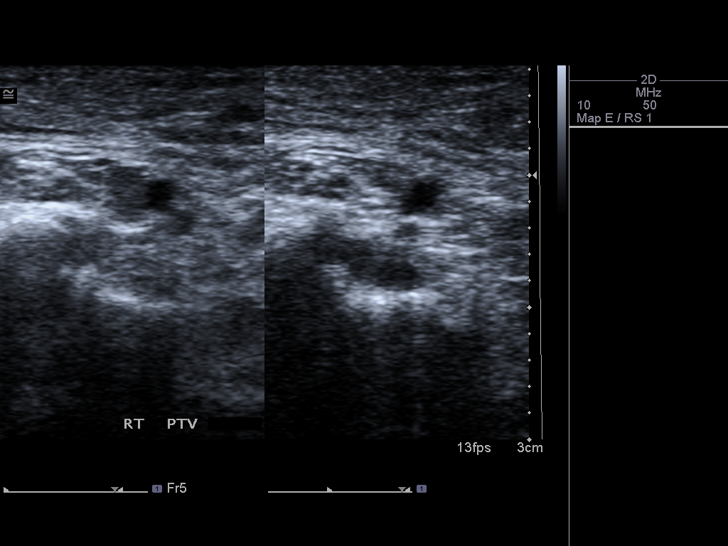
[im 19/23]
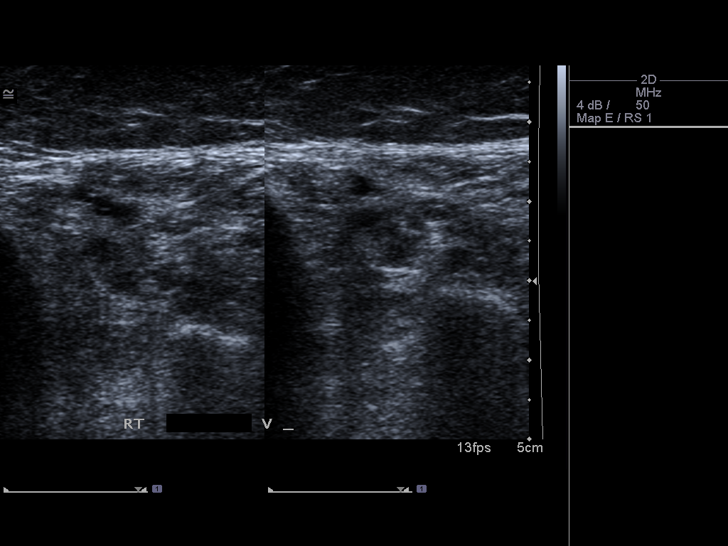
[im 21/23]
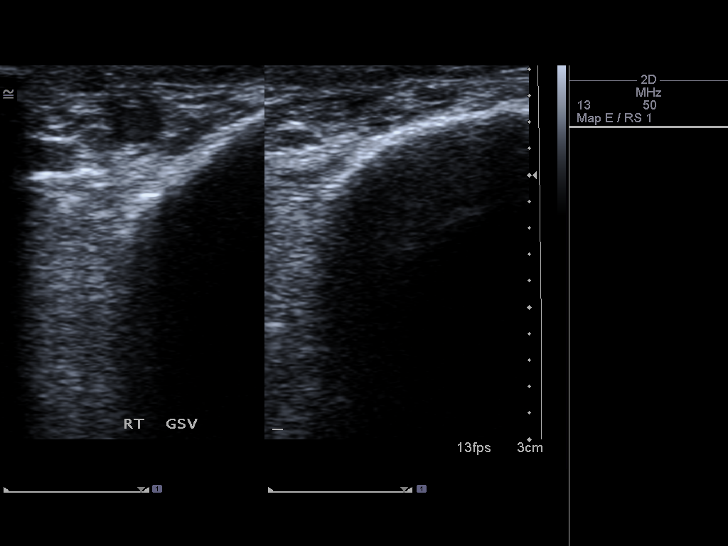
[im 23/23]
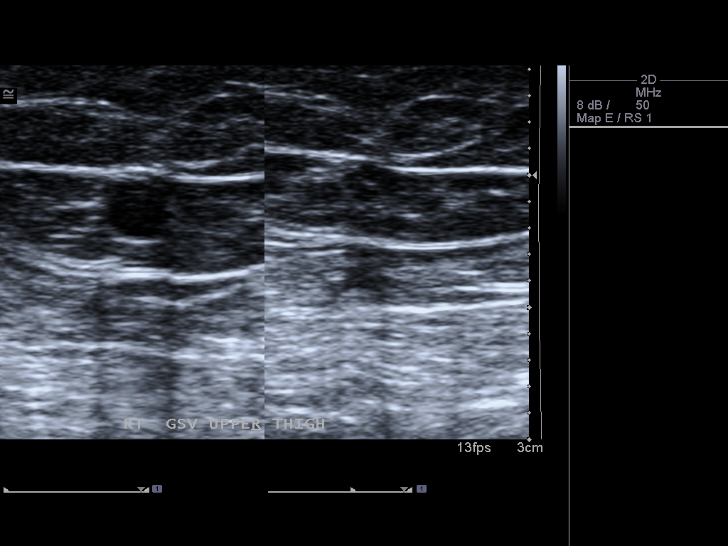

[14 of 23 positions shown; findings below may reference images not displayed]

FINDINGS: Normal compressibility, augmentation and Color Doppler flow in the
right common femoral vein, femoral vein and popliteal vein.
Visualized right calf veins are patent. The right great saphenous
vein is patent.
IMPRESSION: Negative for right lower extremity DVT.

## 2014-01-16 ENCOUNTER — Encounter (HOSPITAL_COMMUNITY): Payer: Self-pay | Admitting: Emergency Medicine

## 2014-01-16 ENCOUNTER — Telehealth: Payer: Self-pay | Admitting: *Deleted

## 2014-01-16 ENCOUNTER — Emergency Department (HOSPITAL_COMMUNITY): Payer: Medicare Other

## 2014-01-16 ENCOUNTER — Inpatient Hospital Stay (HOSPITAL_COMMUNITY)
Admission: EM | Admit: 2014-01-16 | Discharge: 2014-01-22 | DRG: 291 | Disposition: A | Discharge status: Skilled Nursing Facility | Payer: Medicare Other | Attending: Family Medicine | Admitting: Family Medicine

## 2014-01-16 DIAGNOSIS — Z8249 Family history of ischemic heart disease and other diseases of the circulatory system: Secondary | ICD-10-CM | POA: Diagnosis not present

## 2014-01-16 DIAGNOSIS — R609 Edema, unspecified: Secondary | ICD-10-CM | POA: Diagnosis not present

## 2014-01-16 DIAGNOSIS — J9620 Acute and chronic respiratory failure, unspecified whether with hypoxia or hypercapnia: Secondary | ICD-10-CM | POA: Diagnosis not present

## 2014-01-16 DIAGNOSIS — C50919 Malignant neoplasm of unspecified site of unspecified female breast: Secondary | ICD-10-CM

## 2014-01-16 DIAGNOSIS — R0602 Shortness of breath: Secondary | ICD-10-CM | POA: Diagnosis not present

## 2014-01-16 DIAGNOSIS — B029 Zoster without complications: Secondary | ICD-10-CM

## 2014-01-16 DIAGNOSIS — I5043 Acute on chronic combined systolic (congestive) and diastolic (congestive) heart failure: Secondary | ICD-10-CM | POA: Diagnosis not present

## 2014-01-16 DIAGNOSIS — I5020 Unspecified systolic (congestive) heart failure: Secondary | ICD-10-CM | POA: Diagnosis not present

## 2014-01-16 DIAGNOSIS — Z9221 Personal history of antineoplastic chemotherapy: Secondary | ICD-10-CM

## 2014-01-16 DIAGNOSIS — E871 Hypo-osmolality and hyponatremia: Secondary | ICD-10-CM | POA: Diagnosis present

## 2014-01-16 DIAGNOSIS — E785 Hyperlipidemia, unspecified: Secondary | ICD-10-CM | POA: Diagnosis present

## 2014-01-16 DIAGNOSIS — I959 Hypotension, unspecified: Secondary | ICD-10-CM | POA: Diagnosis not present

## 2014-01-16 DIAGNOSIS — N039 Chronic nephritic syndrome with unspecified morphologic changes: Secondary | ICD-10-CM

## 2014-01-16 DIAGNOSIS — K7689 Other specified diseases of liver: Secondary | ICD-10-CM | POA: Diagnosis not present

## 2014-01-16 DIAGNOSIS — J811 Chronic pulmonary edema: Secondary | ICD-10-CM | POA: Diagnosis not present

## 2014-01-16 DIAGNOSIS — Z807 Family history of other malignant neoplasms of lymphoid, hematopoietic and related tissues: Secondary | ICD-10-CM

## 2014-01-16 DIAGNOSIS — K5900 Constipation, unspecified: Secondary | ICD-10-CM

## 2014-01-16 DIAGNOSIS — I5033 Acute on chronic diastolic (congestive) heart failure: Secondary | ICD-10-CM | POA: Diagnosis not present

## 2014-01-16 DIAGNOSIS — Z825 Family history of asthma and other chronic lower respiratory diseases: Secondary | ICD-10-CM

## 2014-01-16 DIAGNOSIS — I10 Essential (primary) hypertension: Secondary | ICD-10-CM | POA: Diagnosis not present

## 2014-01-16 DIAGNOSIS — J9600 Acute respiratory failure, unspecified whether with hypoxia or hypercapnia: Secondary | ICD-10-CM | POA: Diagnosis not present

## 2014-01-16 DIAGNOSIS — J449 Chronic obstructive pulmonary disease, unspecified: Secondary | ICD-10-CM | POA: Diagnosis not present

## 2014-01-16 DIAGNOSIS — I059 Rheumatic mitral valve disease, unspecified: Secondary | ICD-10-CM | POA: Diagnosis not present

## 2014-01-16 DIAGNOSIS — I509 Heart failure, unspecified: Secondary | ICD-10-CM

## 2014-01-16 DIAGNOSIS — I129 Hypertensive chronic kidney disease with stage 1 through stage 4 chronic kidney disease, or unspecified chronic kidney disease: Secondary | ICD-10-CM | POA: Diagnosis present

## 2014-01-16 DIAGNOSIS — K761 Chronic passive congestion of liver: Secondary | ICD-10-CM | POA: Diagnosis present

## 2014-01-16 DIAGNOSIS — C9000 Multiple myeloma not having achieved remission: Secondary | ICD-10-CM | POA: Diagnosis not present

## 2014-01-16 DIAGNOSIS — N179 Acute kidney failure, unspecified: Secondary | ICD-10-CM | POA: Diagnosis present

## 2014-01-16 DIAGNOSIS — R778 Other specified abnormalities of plasma proteins: Secondary | ICD-10-CM

## 2014-01-16 DIAGNOSIS — E875 Hyperkalemia: Secondary | ICD-10-CM

## 2014-01-16 DIAGNOSIS — R1011 Right upper quadrant pain: Secondary | ICD-10-CM | POA: Diagnosis not present

## 2014-01-16 DIAGNOSIS — D630 Anemia in neoplastic disease: Secondary | ICD-10-CM | POA: Diagnosis present

## 2014-01-16 DIAGNOSIS — S22000A Wedge compression fracture of unspecified thoracic vertebra, initial encounter for closed fracture: Secondary | ICD-10-CM

## 2014-01-16 DIAGNOSIS — R197 Diarrhea, unspecified: Secondary | ICD-10-CM | POA: Diagnosis present

## 2014-01-16 DIAGNOSIS — Z923 Personal history of irradiation: Secondary | ICD-10-CM

## 2014-01-16 DIAGNOSIS — I428 Other cardiomyopathies: Secondary | ICD-10-CM | POA: Diagnosis not present

## 2014-01-16 DIAGNOSIS — Z91199 Patient's noncompliance with other medical treatment and regimen due to unspecified reason: Secondary | ICD-10-CM

## 2014-01-16 DIAGNOSIS — E46 Unspecified protein-calorie malnutrition: Secondary | ICD-10-CM | POA: Diagnosis not present

## 2014-01-16 DIAGNOSIS — C9002 Multiple myeloma in relapse: Secondary | ICD-10-CM | POA: Diagnosis not present

## 2014-01-16 DIAGNOSIS — Z5189 Encounter for other specified aftercare: Secondary | ICD-10-CM | POA: Diagnosis not present

## 2014-01-16 DIAGNOSIS — I5023 Acute on chronic systolic (congestive) heart failure: Secondary | ICD-10-CM | POA: Diagnosis not present

## 2014-01-16 DIAGNOSIS — R933 Abnormal findings on diagnostic imaging of other parts of digestive tract: Secondary | ICD-10-CM

## 2014-01-16 DIAGNOSIS — Z79899 Other long term (current) drug therapy: Secondary | ICD-10-CM | POA: Diagnosis not present

## 2014-01-16 DIAGNOSIS — I119 Hypertensive heart disease without heart failure: Secondary | ICD-10-CM | POA: Diagnosis not present

## 2014-01-16 DIAGNOSIS — N183 Chronic kidney disease, stage 3 unspecified: Secondary | ICD-10-CM

## 2014-01-16 DIAGNOSIS — E43 Unspecified severe protein-calorie malnutrition: Secondary | ICD-10-CM | POA: Diagnosis not present

## 2014-01-16 DIAGNOSIS — N63 Unspecified lump in unspecified breast: Secondary | ICD-10-CM

## 2014-01-16 DIAGNOSIS — R1031 Right lower quadrant pain: Secondary | ICD-10-CM

## 2014-01-16 DIAGNOSIS — R52 Pain, unspecified: Secondary | ICD-10-CM | POA: Diagnosis not present

## 2014-01-16 DIAGNOSIS — C773 Secondary and unspecified malignant neoplasm of axilla and upper limb lymph nodes: Secondary | ICD-10-CM

## 2014-01-16 DIAGNOSIS — Z9010 Acquired absence of unspecified breast and nipple: Secondary | ICD-10-CM

## 2014-01-16 DIAGNOSIS — J90 Pleural effusion, not elsewhere classified: Secondary | ICD-10-CM | POA: Diagnosis not present

## 2014-01-16 DIAGNOSIS — Z9119 Patient's noncompliance with other medical treatment and regimen: Secondary | ICD-10-CM

## 2014-01-16 DIAGNOSIS — Z66 Do not resuscitate: Secondary | ICD-10-CM | POA: Diagnosis present

## 2014-01-16 DIAGNOSIS — D472 Monoclonal gammopathy: Secondary | ICD-10-CM

## 2014-01-16 DIAGNOSIS — T451X5A Adverse effect of antineoplastic and immunosuppressive drugs, initial encounter: Secondary | ICD-10-CM | POA: Diagnosis present

## 2014-01-16 DIAGNOSIS — E876 Hypokalemia: Secondary | ICD-10-CM | POA: Diagnosis not present

## 2014-01-16 DIAGNOSIS — D649 Anemia, unspecified: Secondary | ICD-10-CM

## 2014-01-16 DIAGNOSIS — IMO0002 Reserved for concepts with insufficient information to code with codable children: Secondary | ICD-10-CM

## 2014-01-16 DIAGNOSIS — D631 Anemia in chronic kidney disease: Secondary | ICD-10-CM | POA: Diagnosis present

## 2014-01-16 HISTORY — DX: Unspecified systolic (congestive) heart failure: I50.20

## 2014-01-16 HISTORY — DX: Unspecified severe protein-calorie malnutrition: E43

## 2014-01-16 LAB — COMPREHENSIVE METABOLIC PANEL
ALBUMIN: 3.2 g/dL — AB (ref 3.5–5.2)
ALK PHOS: 142 U/L — AB (ref 39–117)
ALT: 41 U/L — ABNORMAL HIGH (ref 0–35)
AST: 41 U/L — ABNORMAL HIGH (ref 0–37)
BILIRUBIN TOTAL: 0.6 mg/dL (ref 0.3–1.2)
BUN: 32 mg/dL — ABNORMAL HIGH (ref 6–23)
CHLORIDE: 93 meq/L — AB (ref 96–112)
CO2: 19 mEq/L (ref 19–32)
Calcium: 10.6 mg/dL — ABNORMAL HIGH (ref 8.4–10.5)
Creatinine, Ser: 1.27 mg/dL — ABNORMAL HIGH (ref 0.50–1.10)
GFR calc Af Amer: 45 mL/min — ABNORMAL LOW (ref >90)
GFR calc non Af Amer: 39 mL/min — ABNORMAL LOW (ref >90)
Glucose, Bld: 138 mg/dL — ABNORMAL HIGH (ref 70–99)
POTASSIUM: 5.5 meq/L — AB (ref 3.7–5.3)
Sodium: 130 mEq/L — ABNORMAL LOW (ref 137–147)
Total Protein: 7.8 g/dL (ref 6.0–8.3)

## 2014-01-16 LAB — CBC WITH DIFFERENTIAL/PLATELET
BASOS ABS: 0 10*3/uL (ref 0.0–0.1)
BASOS PCT: 0 % (ref 0–1)
Eosinophils Absolute: 0 10*3/uL (ref 0.0–0.7)
Eosinophils Relative: 0 % (ref 0–5)
HCT: 27.4 % — ABNORMAL LOW (ref 36.0–46.0)
Hemoglobin: 9 g/dL — ABNORMAL LOW (ref 12.0–15.0)
Lymphocytes Relative: 12 % (ref 12–46)
Lymphs Abs: 0.7 10*3/uL (ref 0.7–4.0)
MCH: 31.9 pg (ref 26.0–34.0)
MCHC: 32.8 g/dL (ref 30.0–36.0)
MCV: 97.2 fL (ref 78.0–100.0)
Monocytes Absolute: 0.4 10*3/uL (ref 0.1–1.0)
Monocytes Relative: 7 % (ref 3–12)
NEUTROS ABS: 4.7 10*3/uL (ref 1.7–7.7)
NEUTROS PCT: 80 % — AB (ref 43–77)
Platelets: 228 10*3/uL (ref 150–400)
RBC: 2.82 MIL/uL — ABNORMAL LOW (ref 3.87–5.11)
RDW: 17.4 % — AB (ref 11.5–15.5)
WBC: 5.9 10*3/uL (ref 4.0–10.5)

## 2014-01-16 LAB — I-STAT TROPONIN, ED: Troponin i, poc: 0.06 ng/mL (ref 0.00–0.08)

## 2014-01-16 LAB — PRO B NATRIURETIC PEPTIDE: Pro B Natriuretic peptide (BNP): 7214 pg/mL — ABNORMAL HIGH (ref 0–450)

## 2014-01-16 LAB — TSH: TSH: 2.51 u[IU]/mL (ref 0.350–4.500)

## 2014-01-16 LAB — POTASSIUM: Potassium: 5.6 mEq/L — ABNORMAL HIGH (ref 3.7–5.3)

## 2014-01-16 LAB — D-DIMER, QUANTITATIVE (NOT AT ARMC): D-Dimer, Quant: 0.58 ug/mL-FEU — ABNORMAL HIGH (ref 0.00–0.48)

## 2014-01-16 LAB — TROPONIN I: Troponin I: <0.3 ng/mL (ref <0.30)

## 2014-01-16 MED ORDER — CARVEDILOL 3.125 MG PO TABS
3.1250 mg | ORAL_TABLET | Freq: Two times a day (BID) | ORAL | Status: DC
  Administered 2014-01-16 – 2014-01-22 (×12): 3.125 mg via ORAL
  Filled 2014-01-16 (×14): qty 1

## 2014-01-16 MED ORDER — PROCHLORPERAZINE MALEATE 10 MG PO TABS
10.0000 mg | ORAL_TABLET | Freq: Four times a day (QID) | ORAL | Status: DC | PRN
  Filled 2014-01-16: qty 1

## 2014-01-16 MED ORDER — PANTOPRAZOLE SODIUM 20 MG PO TBEC
20.0000 mg | DELAYED_RELEASE_TABLET | Freq: Every day | ORAL | Status: DC
  Administered 2014-01-16 – 2014-01-22 (×7): 20 mg via ORAL
  Filled 2014-01-16 (×7): qty 1

## 2014-01-16 MED ORDER — DIPHENOXYLATE-ATROPINE 2.5-0.025 MG PO TABS: 1.0000 | ORAL_TABLET | Freq: Four times a day (QID) | ORAL | Status: DC | PRN

## 2014-01-16 MED ORDER — FUROSEMIDE 10 MG/ML IJ SOLN
40.0000 mg | Freq: Two times a day (BID) | INTRAMUSCULAR | Status: DC
  Administered 2014-01-17 (×2): 40 mg via INTRAVENOUS
  Filled 2014-01-16 (×5): qty 4

## 2014-01-16 MED ORDER — HYDROMORPHONE HCL 2 MG PO TABS
2.0000 mg | ORAL_TABLET | ORAL | Status: DC | PRN
  Administered 2014-01-21 – 2014-01-22 (×2): 2 mg via ORAL
  Filled 2014-01-16 (×3): qty 1

## 2014-01-16 MED ORDER — VALACYCLOVIR HCL 500 MG PO TABS
1000.0000 mg | ORAL_TABLET | Freq: Every day | ORAL | Status: DC
  Administered 2014-01-16 – 2014-01-22 (×7): 1000 mg via ORAL
  Filled 2014-01-16 (×7): qty 2

## 2014-01-16 MED ORDER — SODIUM CHLORIDE 0.9 % IJ SOLN
3.0000 mL | Freq: Two times a day (BID) | INTRAMUSCULAR | Status: DC
  Administered 2014-01-16 – 2014-01-22 (×12): 3 mL via INTRAVENOUS

## 2014-01-16 MED ORDER — ASPIRIN EC 81 MG PO TBEC
81.0000 mg | DELAYED_RELEASE_TABLET | Freq: Every day | ORAL | Status: DC
  Administered 2014-01-16 – 2014-01-19 (×4): 81 mg via ORAL
  Filled 2014-01-16 (×5): qty 1

## 2014-01-16 MED ORDER — POLYETHYLENE GLYCOL 3350 17 G PO PACK
17.0000 g | PACK | Freq: Two times a day (BID) | ORAL | Status: DC | PRN
  Administered 2014-01-18 – 2014-01-20 (×2): 17 g via ORAL
  Filled 2014-01-16 (×2): qty 1

## 2014-01-16 MED ORDER — CALCIUM CARBONATE 1250 (500 CA) MG PO TABS
1250.0000 mg | ORAL_TABLET | Freq: Every day | ORAL | Status: DC | PRN
  Filled 2014-01-16: qty 1

## 2014-01-16 MED ORDER — FUROSEMIDE 10 MG/ML IJ SOLN
40.0000 mg | Freq: Once | INTRAMUSCULAR | Status: AC
  Administered 2014-01-16: 40 mg via INTRAVENOUS
  Filled 2014-01-16: qty 4

## 2014-01-16 MED ORDER — ONDANSETRON HCL 4 MG PO TABS: 8.0000 mg | ORAL_TABLET | Freq: Two times a day (BID) | ORAL | Status: DC | PRN

## 2014-01-16 MED ORDER — SODIUM CHLORIDE 0.9 % IJ SOLN: 3.0000 mL | INTRAMUSCULAR | Status: DC | PRN

## 2014-01-16 MED ORDER — ENOXAPARIN SODIUM 30 MG/0.3ML ~~LOC~~ SOLN
30.0000 mg | SUBCUTANEOUS | Status: DC
  Administered 2014-01-16 – 2014-01-17 (×2): 30 mg via SUBCUTANEOUS
  Filled 2014-01-16 (×3): qty 0.3

## 2014-01-16 MED ORDER — SODIUM CHLORIDE 0.9 % IV SOLN: 250.0000 mL | INTRAVENOUS | Status: DC | PRN

## 2014-01-16 NOTE — ED Provider Notes (Signed)
CSN: [id number]     Arrival date & time 01/16/14  1456 History   First MD Initiated Contact with Patient 01/16/14 1543     Chief Complaint  Patient presents with  . Shortness of Breath     (Consider location/radiation/quality/duration/timing/severity/associated sxs/prior Treatment) Patient is a 80 y.o. female presenting with shortness of breath.  Shortness of Breath  She complains of shortness of breath, gradual in onset, over one to 2 weeks, and worsened last night. Chest pain is worse with minimal exertion, such as walking from room to room in her home. She comes here by private vehicle for evaluation. She is currently on oral medications for cancer on holiday because of diarrhea, for 3 weeks. She is due to see her oncologist, in 2 days. She denies cough or sputum production. She does not have a history of congestive heart failure. She has a poor appetite for several weeks. She denies vomiting, diarrhea, dysuria, or urinary frequency. She has had constipation for the last 2 weeks. She's taking her usual medications, without relief. There are no other known modifying factors.  Past Medical History  Diagnosis Date  . Diverticulosis   . Hiatal hernia   . Hyperlipidemia   . Hypertension   . Breast mass   . Multiple myeloma   . Breast cancer   . Arthritis   . S/P radiation therapy 12/15/12 - 12/21/12    T6 - L1 / 18.75 Gy / 5 Fractions  . Bilateral atelectasis   . Systolic heart failure     EF 20% 11/2013   Past Surgical History  Procedure Laterality Date  . Tonsillectomy    . Modified mastectomy Right 01/25/2013    Procedure: MODIFIED MASTECTOMY;  Surgeon: Edward Jolly, MD;  Location: [facility];  Service: General;  Laterality: Right;   Family History  Problem Relation Age of Onset  . Prostate cancer Father   . Heart attack Brother   . Asthma Maternal Grandmother   . Multiple sclerosis Sister   . Lymphoma Son     large B-cell  . Heart attack Father    History  Substance Use  Topics  . Smoking status: Never Smoker   . Smokeless tobacco: Never Used     Comment: never used product  . Alcohol Use: No     Comment: heavy alcohol use until 2013   OB History   Grav Para Term Preterm Abortions TAB SAB Ect Mult Living                 Review of Systems  Respiratory: Positive for shortness of breath.   All other systems reviewed and are negative.      Allergies  Review of patient's allergies indicates no known allergies.  Home Medications   No current outpatient prescriptions on file. BP 116/68  Pulse 100  Temp(Src) 97.8 F (36.6 C) (Oral)  Resp 24  Ht 5' 3"  (1.6 m)  Wt 130 lb 4.8 oz (59.104 kg)  BMI 23.09 kg/m2  SpO2 98% Physical Exam  Nursing note and vitals reviewed. Constitutional: She is oriented to person, place, and time. She appears well-developed.  Elderly, frail, ill-appearing  HENT:  Head: Normocephalic and atraumatic.  Eyes: Conjunctivae and EOM are normal. Pupils are equal, round, and reactive to light.  Neck: Normal range of motion and phonation normal. Neck supple.  Cardiovascular: Regular rhythm and intact distal pulses.   Tachycardia  Pulmonary/Chest: Effort normal. No respiratory distress. She has no wheezes. She has no rales. She  exhibits no tenderness.  Tachypnea  Abdominal: Soft. She exhibits no distension. There is no tenderness. There is no guarding.  Musculoskeletal: Normal range of motion. She exhibits no edema and no tenderness.  Neurological: She is alert and oriented to person, place, and time. She exhibits normal muscle tone.  Skin: Skin is warm and dry.  Psychiatric: She has a normal mood and affect. Her behavior is normal. Judgment and thought content normal.    ED Course  Procedures (including critical care time) Medications  diphenoxylate-atropine (LOMOTIL) 2.5-0.025 MG per tablet 1-2 tablet (not administered)  valACYclovir (VALTREX) tablet 1,000 mg (1,000 mg Oral Given 01/16/14 2000)  carvedilol (COREG)  tablet 3.125 mg (3.125 mg Oral Given 01/16/14 2000)  HYDROmorphone (DILAUDID) tablet 2 mg (not administered)  ondansetron (ZOFRAN) tablet 8 mg (not administered)  prochlorperazine (COMPAZINE) tablet 10 mg (not administered)  pantoprazole (PROTONIX) EC tablet 20 mg (20 mg Oral Given 01/16/14 2000)  calcium carbonate (OS-CAL - dosed in mg of elemental calcium) tablet 1,250 mg (not administered)  polyethylene glycol (MIRALAX / GLYCOLAX) packet 17 g (not administered)  sodium chloride 0.9 % injection 3 mL (3 mLs Intravenous Given 01/16/14 2148)  sodium chloride 0.9 % injection 3 mL (not administered)  0.9 %  sodium chloride infusion (not administered)  enoxaparin (LOVENOX) injection 30 mg (30 mg Subcutaneous Given 01/16/14 2147)  furosemide (LASIX) injection 40 mg (not administered)  aspirin EC tablet 81 mg (81 mg Oral Given 01/16/14 2000)  furosemide (LASIX) injection 40 mg (40 mg Intravenous Given 01/16/14 1729)    Patient Vitals for the past 24 hrs:  BP Temp Temp src Pulse Resp SpO2 Height Weight  01/16/14 2136 116/68 mmHg 97.8 F (36.6 C) Oral 100 24 98 % - -  01/16/14 2000 - - - 100 - - - -  01/16/14 1852 130/74 mmHg 98.3 F (36.8 C) Oral 101 21 99 % 5' 3"  (1.6 m) 130 lb 4.8 oz (59.104 kg)  01/16/14 1508 113/85 mmHg 97.8 F (36.6 C) - 107 24 97 % - -    4:21 PM Reevaluation with update and discussion. After initial assessment and treatment, an updated evaluation reveals no change in her status. Current evaluation indicates congestive heart failure with elevated BNP. She has a normal age adjusted d-dimer. There is no evidence for ACS. Potassium elevated, will repeat to clarify, will order Lasix for CHF. Tashara Suder L     Labs Review Labs Reviewed  CBC WITH DIFFERENTIAL - Abnormal; Notable for the following:    RBC 2.82 (*)    Hemoglobin 9.0 (*)    HCT 27.4 (*)    RDW 17.4 (*)    Neutrophils Relative % 80 (*)    All other components within normal limits  COMPREHENSIVE METABOLIC  PANEL - Abnormal; Notable for the following:    Sodium 130 (*)    Potassium 5.5 (*)    Chloride 93 (*)    Glucose, Bld 138 (*)    BUN 32 (*)    Creatinine, Ser 1.27 (*)    Calcium 10.6 (*)    Albumin 3.2 (*)    AST 41 (*)    ALT 41 (*)    Alkaline Phosphatase 142 (*)    GFR calc non Af Amer 39 (*)    GFR calc Af Amer 45 (*)    All other components within normal limits  PRO B NATRIURETIC PEPTIDE - Abnormal; Notable for the following:    Pro B Natriuretic peptide (BNP) 7214.0 (*)  All other components within normal limits  D-DIMER, QUANTITATIVE - Abnormal; Notable for the following:    D-Dimer, Quant 0.58 (*)    All other components within normal limits  POTASSIUM - Abnormal; Notable for the following:    Potassium 5.6 (*)    All other components within normal limits  TROPONIN I  TSH  BASIC METABOLIC PANEL  TROPONIN I  TROPONIN I  Randolm Idol, ED   Imaging Review Dg Chest 2 View  01/16/2014   CLINICAL DATA:  Increase shortness of breath.  EXAM: CHEST  2 VIEW  COMPARISON:  01/23/2013  FINDINGS: There is increased cardiomegaly with new pulmonary vascular congestion and mild bilateral interstitial pulmonary edema with small bilateral pleural effusions.  There are multiple compression fractures in the thoracic spine with vertebroplasty at T11. No acute compression fractures.  IMPRESSION: New cardiomegaly with pulmonary vascular congestion and mild interstitial edema with small bilateral effusions.   Electronically Signed   By: Rozetta Nunnery M.D.   On: 01/16/2014 15:46     EKG Interpretation None      MDM   Final diagnoses:  Congestive heart failure  Breast cancer  Myeloma   Dyspnea apparently related to congestive heart failure. The patient has known decreased ventricular ejection fraction of 20% (2/14), which is decreased from 55% in September 2014. She'll need to be admitted for stabilization.  Nursing Notes Reviewed/ Care Coordinated, and agree without  changes. Applicable Imaging Reviewed.  Interpretation of Laboratory Data incorporated into ED treatment  Plan: Admit    Richarda Blade, MD 01/16/14 2318

## 2014-01-16 NOTE — H&P (Signed)
[facility] History and Physical  Jacqueline Rivas UXL:[id number] DOB: Oct 30, 1931 DOA: 01/16/2014  Referring physician:   Daleen Bo PCP:  No primary provider on file.   ONC:  Dr. Marin Olp  Chief Complaint:  SOB  HPI:  The patient is a 80 y.o. year-old female with history of breast cancer status post modified mastectomy, XRT, and chemotherapy, multiple myeloma on chemotherapy, hypertension, hyperlipidemia, systolic heart failure with ejection fraction of 20% who presents with shortness of breath.  She was diagnosed with breast cancer approximately 2 years ago, and has been receiving treatment by Doctor Marin Olp. She was later diagnosed with multiple myeloma. She had an ejection fraction that was preserved in the fall of 2014, however routine echocardiogram to followup after administration of chemotherapy in February of 2015 demonstrated an ejection fraction of 20% with diffuse hypokinesis and without wall motion abnormality. She was referred to cardiology, however she has not been able to keep this appointment. She was given a prescription for carvedilol and Maxzide. She has been taking these medications regularly. The patient was last at their baseline health until about 1 week ago. For the last week she has had increased dyspnea on exertion. She lives in an apartment alone, normally ambulates with a cane, but for the last 2 days, she had used a walker because of dyspnea. She denies PND, however she had severe orthopnea last night. She has had increasing lower extremity edema and abdominal swelling. She states she has lost weight secondary to her chemotherapy and chronic diarrhea.  She states that she may have eaten more salt than usual because she cooks bacon, ham, and roast beef this week. She denies any recent viral illnesses. She has intermittent chest pains which have been going on for several years.  In the emergency department, oxygen saturation 93% on room air however she was to get into the  30s. She was started on 2 L nasal cannula for comfort. Labs were notable for white blood cell count of 5.9, hemoglobin 9, platelets 228, sodium 1:30, potassium 5.5, chloride 93, BUN 32, creatinine 1.27, calcium 10.6, glucose 138. ProBNP 7214 (no prior for comparison).  POC trop 0.06. Chest x-ray demonstrated vascular congestion, mild interstitial edema, and bilateral small effusions.  She was given lasix 18m IV and has voided twice.     Review of Systems:  General:  Denies fevers, chills, weight loss or gain HEENT:  Denies changes to hearing and vision, rhinorrhea, sinus congestion, sore throat CV:  Denies chest pain and palpitations, positive lower extremity edema.  PULM:  Positive SOB, wheezing, cough.   GI:  Denies nausea, vomiting, constipation, positive chronic diarrhea.   GU:  Denies dysuria, frequency, urgency ENDO:  Denies polyuria, polydipsia.   HEME:  Denies hematemesis, blood in stools, melena, abnormal bruising or bleeding.  LYMPH:  Denies lymphadenopathy.   MSK:  Denies arthralgias, myalgias.   DERM:  Denies skin rash or ulcer.   NEURO:  Denies focal numbness, weakness, slurred speech, confusion, facial droop.  PSYCH:  Denies anxiety and depression.    Past Medical History  Diagnosis Date  . Diverticulosis   . Hiatal hernia   . Hyperlipidemia   . Hypertension   . Breast mass   . Multiple myeloma   . Breast cancer   . Arthritis   . S/P radiation therapy 12/15/12 - 12/21/12    T6 - L1 / 18.75 Gy / 5 Fractions  . Bilateral atelectasis   . Systolic heart failure     EF 20%  11/2013   Past Surgical History  Procedure Laterality Date  . Tonsillectomy    . Modified mastectomy Right 01/25/2013    Procedure: MODIFIED MASTECTOMY;  Surgeon: Edward Jolly, MD;  Location: [facility];  Service: General;  Laterality: Right;   Social History:  reports that she has never smoked. She has never used smokeless tobacco. She reports that she does not drink alcohol or use illicit  drugs. Apartment lives alone.  Uses a cane around apartment.  Has walker.  Drives.    No Known Allergies  Family History  Problem Relation Age of Onset  . Prostate cancer Father   . Heart attack Brother   . Asthma Maternal Grandmother   . Multiple sclerosis Sister   . Lymphoma Son     large B-cell  . Heart attack Father      Prior to Admission medications   Medication Sig Start Date End Date Taking? Authorizing Provider  calcium carbonate (OS-CAL - DOSED IN MG OF ELEMENTAL CALCIUM) 1250 MG tablet Take 1,250 mg by mouth daily as needed (on occasion).    Yes Historical Provider, MD  Camphor-Eucalyptus-Menthol (VAPORIZING CHEST RUB EX) Apply 1 application topically daily. Applies to chest as needed   Yes Historical Provider, MD  carvedilol (COREG) 3.125 MG tablet Take 1 tablet (3.125 mg total) by mouth 2 (two) times daily with a meal. 11/30/13  Yes [name], MD  Coenzyme Q10 (CO Q-10) 100 MG CAPS Take 100 mg by mouth daily as needed (on occasion).    Yes Historical Provider, MD  dexamethasone (DECADRON) 4 MG tablet Take 5 pills once a WEEK with food. 09/28/13  Yes [name], MD  diphenoxylate-atropine (LOMOTIL) 2.5-0.025 MG per tablet Take 1-2 tablets by mouth 4 (four) times daily as needed for diarrhea or loose stools. 12/21/13  Yes [name], MD  famciclovir (FAMVIR) 500 MG tablet Take 1 a day to prevent shingles. 12/21/13  Yes [name], MD  fentaNYL (DURAGESIC - DOSED MCG/HR) 12 MCG/HR Place 12.5 mcg onto the skin as needed. NOT USING AS DIRECTED MAY USE ONCE A WEEK 11/30/13  Yes [name], MD  HYDROmorphone (DILAUDID) 2 MG tablet Take 1 tablet (2 mg total) by mouth every 6 (six) hours as needed for pain (takes along with fentanyl patch). 06/15/13  Yes [name], MD  lansoprazole (PREVACID) 30 MG capsule Take 1 capsule (30 mg total) by mouth daily. 12/15/12  Yes [name], MD  Lenalidomide (REVLIMID) 20 MG CAPS Take 20 mg by mouth every morning. 21 days  on 7 days off. Auth # [id number] 12/20/13  Yes [name], MD  MAGNESIUM-ZINC PO Take 1 tablet by mouth daily as needed (constipation).   Yes Historical Provider, MD  ondansetron (ZOFRAN) 8 MG tablet Take 1 tablet (8 mg total) by mouth every 12 (twelve) hours as needed for nausea. 06/02/13  Yes [name], MD  polyethylene glycol (MIRALAX / GLYCOLAX) packet Take 17 g by mouth 2 (two) times daily as needed. For constipation   Yes Historical Provider, MD  potassium chloride SA (K-DUR,KLOR-CON) 20 MEQ tablet Take 20 mEq by mouth daily.   Yes Historical Provider, MD  prochlorperazine (COMPAZINE) 10 MG tablet Take 1 tablet (10 mg total) by mouth every 6 (six) hours as needed. 06/02/13  Yes [name], MD  triamterene-hydrochlorothiazide (MAXZIDE-25) 37.5-25 MG per tablet TAKE 1 TABLET BY MOUTH ONCE DAILY 11/13/13  Yes [name], MD   Physical  Exam: Filed Vitals:   01/16/14 1508 01/16/14 1852  BP: 113/85 130/74  Pulse: 107 101  Temp: 97.8 F (36.6 C) 98.3 F (36.8 C)  TempSrc:  Oral  Resp: 24 21  Height:  5' 3"  (1.6 m)  Weight:  59.104 kg (130 lb 4.8 oz)  SpO2: 97% 99%     General:  Caucasian female, mild respiratory distress but SCM retractions, a sub-costal retractions  Eyes:  PERRL, anicteric, non-injected.  ENT:  Nares clear.  OP clear, non-erythematous without plaques or exudates.  MMM.  Neck:  Supple without TM or JVD.    Lymph:  No cervical, supraclavicular, or submandibular LAD.  Cardiovascular:  RRR, normal S1, S2, positive gallop.  2+ pulses, warm extremities  Respiratory:  Coarse bilateral rales at the bases, but clear about midway up the back, no wheezes, rhonchi bilaterally.  Abdomen:  NABS.  Soft, nondistended, mild tenderness to palpation in the right upper quadrant  Skin:  No rashes or focal lesions.  Musculoskeletal:  Normal bulk and tone.  2+ LE edema.  Psychiatric:  A & O x 4.  Appropriate affect.  Neurologic:  CN 3-12 intact.  5/5 strength.   Sensation intact.  Labs on Admission:  Basic Metabolic Panel:  Recent Labs Lab 01/16/14 1615 01/16/14 1720  NA 130*  --   K 5.5* 5.6*  CL 93*  --   CO2 19  --   GLUCOSE 138*  --   BUN 32*  --   CREATININE 1.27*  --   CALCIUM 10.6*  --    Liver Function Tests:  Recent Labs Lab 01/16/14 1615  AST 41*  ALT 41*  ALKPHOS 142*  BILITOT 0.6  PROT 7.8  ALBUMIN 3.2*   No results found for this basename: LIPASE, AMYLASE,  in the last 168 hours No results found for this basename: AMMONIA,  in the last 168 hours CBC:  Recent Labs Lab 01/16/14 1615  WBC 5.9  NEUTROABS 4.7  HGB 9.0*  HCT 27.4*  MCV 97.2  PLT 228   Cardiac Enzymes: No results found for this basename: CKTOTAL, CKMB, CKMBINDEX, TROPONINI,  in the last 168 hours  BNP (last 3 results)  Recent Labs  01/16/14 1615  PROBNP 7214.0*   CBG: No results found for this basename: GLUCAP,  in the last 168 hours  Radiological Exams on Admission: Dg Chest 2 View  01/16/2014   CLINICAL DATA:  Increase shortness of breath.  EXAM: CHEST  2 VIEW  COMPARISON:  01/23/2013  FINDINGS: There is increased cardiomegaly with new pulmonary vascular congestion and mild bilateral interstitial pulmonary edema with small bilateral pleural effusions.  There are multiple compression fractures in the thoracic spine with vertebroplasty at T11. No acute compression fractures.  IMPRESSION: New cardiomegaly with pulmonary vascular congestion and mild interstitial edema with small bilateral effusions.   Electronically Signed   By: Rozetta Nunnery M.D.   On: 01/16/2014 15:46    EKG: Independently reviewed. Sinus tachycardia, left anterior fascicular block, suggestion of old anterior infarct which is more pronounced than prior.  Assessment/Plan Principal Problem:   Acute on chronic diastolic heart failure Active Problems:   HYPONATREMIA   Serum calcium elevated   Shingles outbreak   Multiple myeloma, without mention of having achieved  remission(203.00)   Breast cancer metastasized to axillary lymph node   Chronic kidney disease (CKD), stage III (moderate)   Hyperkalemia  ---  Acute respiratory failure secondary to acute on chronic diastolic heart failure, may have been  triggered by increased salt in her diet, recent shingles outbreak. -  Admit to telemetry -  Cycle troponins -  TSH -  Repeat echocardiogram -  Lasix 40 mg twice a day -  No potassium supplementation at this time -  Consider cardiology consult in the morning to establish care -  Daily weights -  Strict ins and outs -  Continue beta blocker -  Hold ACE and spironolactone given hyperkalemia -  Nutrition consult for diet recommendations  RUQ pain, likely related to hepatic congestion, LFTs mildly elevated  -  RUQ [place] -  Repeat LFTs in AM  Hyponatremia, likely secondary to heart failure exacerbation and should improve with diuresis -  Repeat BMP in a.m.  Hyperkalemia, may be secondary to potassium supplements in the setting of chronic kidney disease stage III -  Hold potassium supplements -  Lasix as above  Breast cancer and multiple myeloma with persistent hypercalcemia -  Per Dr. Marin Olp  Shingles, crusted over.   -  No need for airborn isolation -  Continue antivirals  CKD stage 3, stable  -  Minimize nephrotoxins and renally dose medications  HTN/HLD, blood pressure stable -  Continue coreg  Normocytic anemia, likely due to CKD, MM, and chemo -  Per Dr. Marin Olp  Diet:  2gm sodium, 1.5L fluid restriction Access:  PIV IVF:  off Proph:  lovenox  Code Status:  partial Family Communication: patient and son Disposition Plan: Admit to telemetry  Time spent: 60 min Janece Canterbury [facility] Pager [phone]  If 7PM-7AM, please contact night-coverage [email or web address] Password [facility] 01/16/2014, 6:56 PM

## 2014-01-16 NOTE — ED Notes (Signed)
Pt states battling two forms of cancer; states having shortness of breath; worse since yesterday; can't walk short distances; can't lie flat; states stood most of the night; sats 93% initially; feet swollen

## 2014-01-16 NOTE — Telephone Encounter (Signed)
Patient called stating that since yesterday she has been very SOB since yesterday continuing into today came on suddenly. Also states that she is not making urine and legs and feet are very swollen.  Told patient to call 911 immediately and to go [facility] to ED. Also called patients son to alert him about his mom,

## 2014-01-17 ENCOUNTER — Inpatient Hospital Stay (HOSPITAL_COMMUNITY): Payer: Medicare Other

## 2014-01-17 DIAGNOSIS — C9000 Multiple myeloma not having achieved remission: Secondary | ICD-10-CM | POA: Diagnosis not present

## 2014-01-17 DIAGNOSIS — C50919 Malignant neoplasm of unspecified site of unspecified female breast: Secondary | ICD-10-CM

## 2014-01-17 DIAGNOSIS — R1011 Right upper quadrant pain: Secondary | ICD-10-CM | POA: Diagnosis not present

## 2014-01-17 DIAGNOSIS — I509 Heart failure, unspecified: Secondary | ICD-10-CM

## 2014-01-17 DIAGNOSIS — I059 Rheumatic mitral valve disease, unspecified: Secondary | ICD-10-CM

## 2014-01-17 LAB — BASIC METABOLIC PANEL
BUN: 37 mg/dL — AB (ref 6–23)
CALCIUM: 9.9 mg/dL (ref 8.4–10.5)
CO2: 21 meq/L (ref 19–32)
Chloride: 92 mEq/L — ABNORMAL LOW (ref 96–112)
Creatinine, Ser: 1.41 mg/dL — ABNORMAL HIGH (ref 0.50–1.10)
GFR calc Af Amer: 40 mL/min — ABNORMAL LOW (ref >90)
GFR calc non Af Amer: 34 mL/min — ABNORMAL LOW (ref >90)
GLUCOSE: 133 mg/dL — AB (ref 70–99)
Potassium: 5 mEq/L (ref 3.7–5.3)
Sodium: 129 mEq/L — ABNORMAL LOW (ref 137–147)

## 2014-01-17 LAB — TROPONIN I
Troponin I: <0.3 ng/mL (ref <0.30)
Troponin I: <0.3 ng/mL (ref <0.30)

## 2014-01-17 NOTE — Consult Note (Signed)
Referral MD  Reason for Referral: Multiple myeloma-IgA kappa subtype Stage IIB ductal carcinoma of the right breast   HPI: Jacqueline Rivas is well-known to me. She's a 50 old white female. She has 2 separate malignancies. She has IgA kappa myeloma. She also has history of stage IIb (T2N1M0) infiltrating ductal carcinoma of the right breast. She had a mastectomy. She had. 3 positive lymph nodes.  We haven't been treating her with Pomalidomide.  We have had good success. For M spike is now down to 1.06. Kappa light chain is down to 5.7. Her IgA level is down to 400.  She unfortunately has congestive heart failure. She had been on Herceptin and Perjeta.  Am not sure if she is compliant with medications. I am not sure what her diet has been.   She's come in with congestive heart failure. Chest x-ray showed vascular congestion.  She's been diuresed. She feels better.  When she came in, her hemoglobin was 9. White cell count was 5.9. Her renal function is slightly decreased. This is chronic for her. Her BUN was 37 and creatinine 1.41 today.  Again, Jacqueline Rivas has a tendency to "do her own thing" and I am not sure how compliant she has been with medications.            Chief Complaint  Patient presents with  . Shortness of Breath    Past Medical History  Diagnosis Date  . Diverticulosis   . Hiatal hernia   . Hyperlipidemia   . Hypertension   . Breast mass   . Multiple myeloma   . Breast cancer   . Arthritis   . S/P radiation therapy 12/15/12 - 12/21/12    T6 - L1 / 18.75 Gy / 5 Fractions  . Bilateral atelectasis   . Systolic heart failure     EF 20% 11/2013  :  Past Surgical History  Procedure Laterality Date  . Tonsillectomy    . Modified mastectomy Right 01/25/2013    Procedure: MODIFIED MASTECTOMY;  Surgeon: Edward Jolly, MD;  Location: [facility];  Service: General;  Laterality: Right;  :  Current facility-administered medications:0.9 %  sodium chloride infusion, 250  mL, Intravenous, PRN, Janece Canterbury, MD;  aspirin EC tablet 81 mg, 81 mg, Oral, Daily, Janece Canterbury, MD, 81 mg at 01/16/14 2000;  calcium carbonate (OS-CAL - dosed in mg of elemental calcium) tablet 1,250 mg, 1,250 mg, Oral, Daily PRN, Janece Canterbury, MD;  carvedilol (COREG) tablet 3.125 mg, 3.125 mg, Oral, BID WC, Janece Canterbury, MD, 3.125 mg at 01/16/14 2000 diphenoxylate-atropine (LOMOTIL) 2.5-0.025 MG per tablet 1-2 tablet, 1-2 tablet, Oral, QID PRN, Janece Canterbury, MD;  enoxaparin (LOVENOX) injection 30 mg, 30 mg, Subcutaneous, Q24H, Janece Canterbury, MD, 30 mg at 01/16/14 2147;  furosemide (LASIX) injection 40 mg, 40 mg, Intravenous, BID, Janece Canterbury, MD;  HYDROmorphone (DILAUDID) tablet 2 mg, 2 mg, Oral, Q4H PRN, Janece Canterbury, MD ondansetron (ZOFRAN) tablet 8 mg, 8 mg, Oral, Q12H PRN, Janece Canterbury, MD;  pantoprazole (PROTONIX) EC tablet 20 mg, 20 mg, Oral, Daily, Janece Canterbury, MD, 20 mg at 01/16/14 2000;  polyethylene glycol (MIRALAX / GLYCOLAX) packet 17 g, 17 g, Oral, BID PRN, Janece Canterbury, MD;  prochlorperazine (COMPAZINE) tablet 10 mg, 10 mg, Oral, Q6H PRN, Janece Canterbury, MD sodium chloride 0.9 % injection 3 mL, 3 mL, Intravenous, Q12H, Janece Canterbury, MD, 3 mL at 01/16/14 2148;  sodium chloride 0.9 % injection 3 mL, 3 mL, Intravenous, PRN, Janece Canterbury, MD;  valACYclovir (  VALTREX) tablet 1,000 mg, 1,000 mg, Oral, Daily, Janece Canterbury, MD, 1,000 mg at 01/16/14 2000:  . aspirin EC  81 mg Oral Daily  . carvedilol  3.125 mg Oral BID WC  . enoxaparin (LOVENOX) injection  30 mg Subcutaneous Q24H  . furosemide  40 mg Intravenous BID  . pantoprazole  20 mg Oral Daily  . sodium chloride  3 mL Intravenous Q12H  . valACYclovir  1,000 mg Oral Daily  :  No Known Allergies:  Family History  Problem Relation Age of Onset  . Prostate cancer Father   . Heart attack Brother   . Asthma Maternal Grandmother   . Multiple sclerosis Sister   . Lymphoma Son     large B-cell   . Heart attack Father   :  History   Social History  . Marital Status: Widowed    Spouse Name: N/A    Number of Children: N/A  . Years of Education: N/A   Occupational History  . Not on file.   Social History Main Topics  . Smoking status: Never Smoker   . Smokeless tobacco: Never Used     Comment: never used product  . Alcohol Use: No     Comment: heavy alcohol use until 2013  . Drug Use: No  . Sexual Activity: No   Other Topics Concern  . Not on file   Social History Narrative   Apartment lives alone.  Uses a cane around apartment.  Has walker.  Drives.    :  Pertinent items are noted in HPI.  Exam: Patient Vitals for the past 24 hrs:  BP Temp Temp src Pulse Resp SpO2 Height Weight  01/17/14 0557 114/80 mmHg 97.6 F (36.4 C) Oral 93 20 98 % - -  01/16/14 2136 116/68 mmHg 97.8 F (36.6 C) Oral 100 24 98 % - -  01/16/14 2000 - - - 100 - - - -  01/16/14 1852 130/74 mmHg 98.3 F (36.8 C) Oral 101 21 99 % 5' 3" (1.6 m) 130 lb 4.8 oz (59.104 kg)  01/16/14 1508 113/85 mmHg 97.8 F (36.6 C) - 107 24 97 % - -   Elderly white female. She is alert. As always, she is talkative. Lungs sound clear. Cardiac exam regular rate and rhythm. She is a 1/6 systolic murmur. Abdomen soft. No palpable liver or spleen tip. Chest wall exam shows right mastectomy. No chest wall nodules. Left breast is unremarkable. Extremities shows some 1+ edema in her legs. His age-related osteopenia changes. Skin exam no rashes. Neurological exam no focal deficits.   Recent Labs  01/16/14 1615  WBC 5.9  HGB 9.0*  HCT 27.4*  PLT 228    Recent Labs  01/16/14 1615 01/16/14 1720 01/17/14 0136  NA 130*  --  129*  K 5.5* 5.6* 5.0  CL 93*  --  92*  CO2 19  --  21  GLUCOSE 138*  --  133*  BUN 32*  --  37*  CREATININE 1.27*  --  1.41*  CALCIUM 10.6*  --  9.9    Blood smear review: None  Pathology: None     Assessment and Plan: Jacqueline Rivas is a 78 old female. She has both myeloma and  localized breast cancer. So far, she is responding to myeloma therapy.  She is in heart failure. It'll be very interested to see what a repeat echocardiogram shows.  She, I think, has ultrasound scheduled for today.  She's supposed to see me tomorrow  in the office. She gets treatment. We will just do what we need to do in the hospital for her. I think she gets her Zometa.  Hopefully, she'll be put on a good regimen for her heart. Again, am not sure how compliant she is with her medications.  We will follow along.  I thank the hospitalist so much for admitting her and taking care of her.  Pete E.  Psalm 3:5-6

## 2014-01-17 NOTE — Progress Notes (Signed)
Note: This document was prepared with digital dictation and possible smart phrase technology. Any transcriptional errors that result from this process are unintentional.   Jacqueline Rivas CNO:[id number] DOB: [birth year] DOA: 01/16/2014 PCP: No primary provider on file.  Brief narrative: 80 y/o ?, known h/o breast stage IIb (T2N1M0) infiltrating ductal carcinoma of the right breast. She had a mastectomy. She had. 3 positive lymph nodes + XRT/chemo, IgA Kappa MMyeloma on therapy [herceptin/perjeta], systolic HF admitted 2/94/76 with acute decompensation HF with possibility non-compliance on medical therapy contributing-she states that she has had diarrhea on Revlimid for the past 2 months and due to this, wouldn't take all of her medicines regularily Admission weight 59kg Weight 12/21/13 at OP visit 55kg  Past medical history-As per Problem list Chart reviewed as below-   Consultants:  Oncology  Procedures:  Echo P  Abd USG-Indeterminate approximately 1.3 cm hyperechoic lesion within the left lobe of the liver - while possibly representative of a hepatic hemangioma, this lesion was not definitely identified on prior abdominal CT examinations and given history of breast cancer, metastatic disease is not excluded.   Antibiotics:  none   Subjective  Well.  Feels a little SOB at times.  Didn't eat yet No n/v/sob    Objective    Interim History:   Telemetry: sinus   Objective: Filed Vitals:   01/16/14 2000 01/16/14 2136 01/17/14 0557 01/17/14 1316  BP:  116/68 114/80 106/71  Pulse: 100 100 93 86  Temp:  97.8 F (36.6 C) 97.6 F (36.4 C) 97.5 F (36.4 C)  TempSrc:  Oral Oral Oral  Resp:  24 20 20   Height:      Weight:      SpO2:  98% 98% 99%    Intake/Output Summary (Last 24 hours) at 01/17/14 1330 Last data filed at 01/17/14 1319  Gross per 24 hour  Intake    300 ml  Output    975 ml  Net   -675 ml    Exam:  General: eomi, ncat Cardiovascular:  s1 s2 no  m/r/g Respiratory: clear, no added sound Abdomen:  Soft, nt/nd Skin: grade 2-3 le edema Neuro intact  Data Reviewed: Basic Metabolic Panel:  Recent Labs Lab 01/16/14 1615 01/16/14 1720 01/17/14 0136  NA 130*  --  129*  K 5.5* 5.6* 5.0  CL 93*  --  92*  CO2 19  --  21  GLUCOSE 138*  --  133*  BUN 32*  --  37*  CREATININE 1.27*  --  1.41*  CALCIUM 10.6*  --  9.9   Liver Function Tests:  Recent Labs Lab 01/16/14 1615  AST 41*  ALT 41*  ALKPHOS 142*  BILITOT 0.6  PROT 7.8  ALBUMIN 3.2*   No results found for this basename: LIPASE, AMYLASE,  in the last 168 hours No results found for this basename: AMMONIA,  in the last 168 hours CBC:  Recent Labs Lab 01/16/14 1615  WBC 5.9  NEUTROABS 4.7  HGB 9.0*  HCT 27.4*  MCV 97.2  PLT 228   Cardiac Enzymes:  Recent Labs Lab 01/16/14 2015 01/17/14 0117 01/17/14 0819  TROPONINI <0.30 <0.30 <0.30   BNP: No components found with this basename: POCBNP,  CBG: No results found for this basename: GLUCAP,  in the last 168 hours  No results found for this or any previous visit (from the past 240 hour(s)).   Studies:              All  Imaging reviewed and is as per above notation   Scheduled Meds: . aspirin EC  81 mg Oral Daily  . carvedilol  3.125 mg Oral BID WC  . enoxaparin (LOVENOX) injection  30 mg Subcutaneous Q24H  . furosemide  40 mg Intravenous BID  . pantoprazole  20 mg Oral Daily  . sodium chloride  3 mL Intravenous Q12H  . valACYclovir  1,000 mg Oral Daily   Continuous Infusions:    Assessment/Plan: 1. Acute respiratory failure  2. Acute decompensated chf-likely systolic-potentially from Herceptin.  Await Echo.  Diurese lasix 40 IV bid-strict i/o, daily weights.  Continue Coreg 3.125 bid 3. Myeloma + Breast Ca-Per oncology. 4. Hyperkalemia-potentially 2/2 to ACE + aldactone which have been held 5. Hypervolemic hyponatremia, 2./2 to CHF-diurese.  Increase solute in diet.  Will fluid restrict further  in am if any worse 6. Liver Mass-Hamangioma vs Metastases-defer to oncology regarding MRI abdomen 7. AKI-continue to monitor function held. 8. Hypercalcemia-resolving slowly with diuresis.  2/2 to MM.  Might need Zmeta  Code Status: Full Family Communication:  None at bedsdie Disposition Plan: Inpatient   Verneita Griffes, MD  [facility] Pager [phone] 01/17/2014, 1:30 PM    LOS: 1 day

## 2014-01-17 NOTE — Progress Notes (Signed)
Echo Lab  2D Echocardiogram completed.  Kamill Fulbright L Delcia Spitzley, RDCS 01/17/2014 11:12 AM

## 2014-01-18 ENCOUNTER — Other Ambulatory Visit: Payer: Medicare Other | Admitting: Lab

## 2014-01-18 ENCOUNTER — Inpatient Hospital Stay (HOSPITAL_COMMUNITY): Payer: Medicare Other

## 2014-01-18 ENCOUNTER — Other Ambulatory Visit: Payer: Self-pay

## 2014-01-18 ENCOUNTER — Ambulatory Visit: Payer: Medicare Other | Admitting: Hematology & Oncology

## 2014-01-18 ENCOUNTER — Ambulatory Visit: Payer: Medicare Other

## 2014-01-18 ENCOUNTER — Encounter (HOSPITAL_COMMUNITY): Payer: Self-pay | Admitting: Physician Assistant

## 2014-01-18 DIAGNOSIS — C9000 Multiple myeloma not having achieved remission: Secondary | ICD-10-CM | POA: Diagnosis not present

## 2014-01-18 DIAGNOSIS — K7689 Other specified diseases of liver: Secondary | ICD-10-CM | POA: Diagnosis not present

## 2014-01-18 DIAGNOSIS — I509 Heart failure, unspecified: Secondary | ICD-10-CM | POA: Diagnosis not present

## 2014-01-18 DIAGNOSIS — E43 Unspecified severe protein-calorie malnutrition: Secondary | ICD-10-CM | POA: Diagnosis not present

## 2014-01-18 DIAGNOSIS — I5023 Acute on chronic systolic (congestive) heart failure: Secondary | ICD-10-CM | POA: Diagnosis not present

## 2014-01-18 DIAGNOSIS — N179 Acute kidney failure, unspecified: Secondary | ICD-10-CM | POA: Diagnosis not present

## 2014-01-18 DIAGNOSIS — D649 Anemia, unspecified: Secondary | ICD-10-CM

## 2014-01-18 DIAGNOSIS — C50919 Malignant neoplasm of unspecified site of unspecified female breast: Secondary | ICD-10-CM | POA: Diagnosis not present

## 2014-01-18 DIAGNOSIS — J449 Chronic obstructive pulmonary disease, unspecified: Secondary | ICD-10-CM | POA: Diagnosis not present

## 2014-01-18 DIAGNOSIS — I5043 Acute on chronic combined systolic (congestive) and diastolic (congestive) heart failure: Secondary | ICD-10-CM | POA: Diagnosis not present

## 2014-01-18 DIAGNOSIS — J9620 Acute and chronic respiratory failure, unspecified whether with hypoxia or hypercapnia: Secondary | ICD-10-CM | POA: Diagnosis not present

## 2014-01-18 LAB — BASIC METABOLIC PANEL
BUN: 56 mg/dL — ABNORMAL HIGH (ref 6–23)
CHLORIDE: 91 meq/L — AB (ref 96–112)
CO2: 24 mEq/L (ref 19–32)
Calcium: 9.3 mg/dL (ref 8.4–10.5)
Creatinine, Ser: 1.83 mg/dL — ABNORMAL HIGH (ref 0.50–1.10)
GFR calc non Af Amer: 25 mL/min — ABNORMAL LOW (ref >90)
GFR, EST AFRICAN AMERICAN: 29 mL/min — AB (ref >90)
Glucose, Bld: 141 mg/dL — ABNORMAL HIGH (ref 70–99)
POTASSIUM: 4.7 meq/L (ref 3.7–5.3)
Sodium: 129 mEq/L — ABNORMAL LOW (ref 137–147)

## 2014-01-18 LAB — CBC
HEMATOCRIT: 25.2 % — AB (ref 36.0–46.0)
Hemoglobin: 8.6 g/dL — ABNORMAL LOW (ref 12.0–15.0)
MCH: 32.7 pg (ref 26.0–34.0)
MCHC: 34.1 g/dL (ref 30.0–36.0)
MCV: 95.8 fL (ref 78.0–100.0)
Platelets: 195 10*3/uL (ref 150–400)
RBC: 2.63 MIL/uL — ABNORMAL LOW (ref 3.87–5.11)
RDW: 17.2 % — AB (ref 11.5–15.5)
WBC: 4.5 10*3/uL (ref 4.0–10.5)

## 2014-01-18 LAB — ABO/RH: ABO/RH(D): O POS

## 2014-01-18 LAB — CANCER ANTIGEN 27.29: CA 27.29: 18 U/mL (ref 0–39)

## 2014-01-18 MED ORDER — FUROSEMIDE 20 MG PO TABS
20.0000 mg | ORAL_TABLET | Freq: Every day | ORAL | Status: DC
  Administered 2014-01-19 – 2014-01-22 (×4): 20 mg via ORAL
  Filled 2014-01-18 (×4): qty 1

## 2014-01-18 MED ORDER — ENOXAPARIN SODIUM 30 MG/0.3ML ~~LOC~~ SOLN
30.0000 mg | SUBCUTANEOUS | Status: DC
  Administered 2014-01-19 – 2014-01-21 (×3): 30 mg via SUBCUTANEOUS
  Filled 2014-01-18 (×5): qty 0.3

## 2014-01-18 MED ORDER — DARBEPOETIN ALFA-POLYSORBATE 300 MCG/0.6ML IJ SOLN
300.0000 ug | Freq: Once | INTRAMUSCULAR | Status: AC
  Administered 2014-01-18: 300 ug via SUBCUTANEOUS
  Filled 2014-01-18: qty 0.6

## 2014-01-18 MED ORDER — CEFAZOLIN SODIUM-DEXTROSE 2-3 GM-% IV SOLR
2.0000 g | INTRAVENOUS | Status: AC
  Administered 2014-01-19: 2 g via INTRAVENOUS
  Filled 2014-01-18: qty 50

## 2014-01-18 MED ORDER — ALPRAZOLAM 0.5 MG PO TABS
0.5000 mg | ORAL_TABLET | Freq: Once | ORAL | Status: AC
  Administered 2014-01-18: 0.5 mg via ORAL
  Filled 2014-01-18: qty 1

## 2014-01-18 NOTE — Progress Notes (Signed)
Pt began feeling SOB. Tele showed patient began to [place] and showed [place] beats. EKG completed showing NSR. Pt no longer feeling SOB. MD paged and made aware. Will continue to monitor.

## 2014-01-18 NOTE — Evaluation (Signed)
Physical Therapy Evaluation Patient Details Name: Jacqueline Rivas MRN: [id number] DOB: 01/19/1932 Today's Date: 01/18/2014   History of Present Illness  Pt is a 80 year old presenting with SOB, B LE edema, and CHF; pt has a history of breast cancer s/p mastectomy  Clinical Impression  Pt admitted with acute on chronic CHF presenting with B LE edema and SOB; history of breast cancer within last 12 months.  Pt reports 'breathing attacks' where she becomes SOB which is improved by standing.  Pt reports over the last 8-9 months she has become less active due to medical condition.  Pt reports decline in last week resulting in not walking or using walker to ambulate in home (baseline uses no assistive device in home and a cane in community). Pt able to ambulate in hallway taking three breaks to stand tall to catch breath.  Pt advised to take breaks to breath deeply standing upright.  At current level of health and lack of assistance at home, pt recommended to discharge to [facility] for further rehab or HHPT recommended to improve safety and mobility at home if more assist available such as caregiver or home health aide.  Pt and family need consult on levels of assistance and rehab available to them.  Pt will benefit from skilled PT to increase their independence and safety with mobility to allow discharge.    Follow Up Recommendations SNF    Equipment Recommendations  None recommended by PT    Recommendations for Other Services       Precautions / Restrictions Precautions Precautions: Fall Precaution Comments: pt reports she has felt the need to use a walker over the last week and has not been walking much over the past several months due to her current medical condition Restrictions Weight Bearing Restrictions: No      Mobility  Bed Mobility Overal bed mobility: Needs Assistance Bed Mobility: Supine to Sit     Supine to sit: Min guard;HOB elevated     General bed mobility comments: verbal  cues for safety; use of bed rail to sit up  Transfers Overall transfer level: Needs assistance Equipment used: Rolling walker (2 wheeled) Transfers: Sit to/from Stand Sit to Stand: Min guard         General transfer comment: verbal cues for safety; pt reported that transfers to the [facility] had exhausted her but able to transfer to stand and ambulate  Ambulation/Gait Ambulation/Gait assistance: Min guard Ambulation Distance (Feet): 200 Feet Assistive device: Rolling walker (2 wheeled) (followed with chair for breaks) Gait Pattern/deviations: WFL(Within Functional Limits) Gait velocity: normal   General Gait Details: pt verbal cues for safety, breathing, and energy conservation; began ambulation at quick pace despite reportedly not walking recently and became fatigue and SOB, advised to walk at a slower pace; pt took 3 breaks, preferred to remain standing to allow her to regain breath better by standing up straight; remained on 2L O2   Stairs            Wheelchair Mobility    Modified Rankin (Stroke Patients Only)       Balance                                             Pertinent Vitals/Pain Activity to tolerance, no complaints of pain.  Remained on 2L O2 [place].      Home Living Family/patient  expects to be discharged to:: Private residence Living Arrangements: Alone Available Help at Discharge: Other (Comment) (currently no help is available at discharge; son present and reports he is trying to establish some kind of hired help at home) Type of Home: Apartment Home Access: Level entry     Home Layout: One level Home Equipment: Environmental consultant - 2 wheels;Cane - single point;Tub bench Additional Comments: uses cane when ambulating in the community; has used the walker in the last week with decline in health; uses tub bench to shower but still feeling exhausted after showers therefore only bathes once per week    Prior Function Level of Independence: Independent  with assistive device(s)               Hand Dominance        Extremity/Trunk Assessment   Upper Extremity Assessment: Defer to OT evaluation           Lower Extremity Assessment: Generalized weakness      Cervical / Trunk Assessment: Kyphotic  Communication   Communication: No difficulties  Cognition Arousal/Alertness: Awake/alert Behavior During Therapy: WFL for tasks assessed/performed Overall Cognitive Status: Within Functional Limits for tasks assessed                      General Comments General comments (skin integrity, edema, etc.): bilateral LE edema    Exercises        Assessment/Plan    PT Assessment Patient needs continued PT services  PT Diagnosis Difficulty walking;Generalized weakness   PT Problem List Decreased activity tolerance;Decreased mobility;Decreased knowledge of use of DME;Cardiopulmonary status limiting activity;Decreased strength  PT Treatment Interventions DME instruction;Gait training;Functional mobility training;Therapeutic activities;Therapeutic exercise;Patient/family education   PT Goals (Current goals can be found in the Care Plan section) Acute Rehab PT Goals Patient Stated Goal: return to living independently PT Goal Formulation: With patient Time For Goal Achievement: 01/25/14 Potential to Achieve Goals: Good    Frequency Min 3X/week   Barriers to discharge        Co-evaluation               End of Session Equipment Utilized During Treatment: Gait belt;Oxygen (2L O2) Activity Tolerance: Patient tolerated treatment well Patient left: in chair;with call bell/phone within reach;with family/visitor present           Time: 1425-1447 PT Time Calculation (min): 22 min   Charges:   PT Evaluation $Initial PT Evaluation Tier I: 1 Procedure PT Treatments $Gait Training: 8-22 mins   PT G Codes:          Jacqulyn Cane 01/18/2014, 3:52 PM Jacqulyn Cane SPT 01/18/2014

## 2014-01-18 NOTE — Progress Notes (Signed)
Jacqueline Rivas looks better. She feels a little bit better. She's had a pretty good diuresis.  Her echocardiogram shows an ejection fraction of 20%. This is about the same as as before. I am not sure if cardiology is to see her so that we can get her on a good regimen for her heart failure.  I do think she needs to have a Port-A-Cath put in. Her IV access is not that great. I still suspect that she will need treatment down the road. Is becoming more of an issue. I talked to her about it. She will agree.  Her ultrasound that was done showed a "indeterminate" 1.3 cm lesion in the left lobe of the liver. Of course, the radiologist recommends a MRI. I'll have to think about this. I would find it hard to believe that she would have metastatic disease.  Her lab work today shows a BUN of 56 and creatinine 1.83. I would think this be indicative of her being "dried out".  I ordered a CBC on her. Personally, I think that she probably needs a blood transfusion if her hemoglobin is below 9. She does not want one.  She does get Aranesp in the office. We will go ahead and give her a dose today.  Patient and consent was brought to help her out.  She really wants to try to get out of bed. It would be nice to see about physical therapy to help her. We will go ahead and continue to follow her along. I still do not believe that she is ready for discharge. Again a Port-A-Cath will help [place] in the long run.   Jacqueline E.  Romans 8:28

## 2014-01-18 NOTE — Progress Notes (Signed)
Patient ID: Jacqueline Rivas, female   DOB: 03/24/1932, 81 y.o.   MRN: [id number] Request received for port a cath placement in pt with history of stage IIB right breast carcinoma and multiple myeloma admitted recently with CHF. Additional PMH as below. Exam: pt awake/alert; chest- sl dim BS bases; heart-RRR; abd- soft,+BS,NT; ext- FROM, trace-1+ edema.   Filed Vitals:   01/17/14 2033 01/17/14 2135 01/18/14 0453 01/18/14 1525  BP: 88/59 105/81 106/70 99/60  Pulse: 85 80 85 70  Temp: 98.4 F (36.9 C)  97.6 F (36.4 C) 97.6 F (36.4 C)  TempSrc: Oral  Oral Oral  Resp: 22  20 20  Height:      Weight:   128 lb 1.4 oz (58.1 kg)   SpO2: 98%  96% 100%   Past Medical History  Diagnosis Date  . Diverticulosis   . Hiatal hernia   . Hyperlipidemia   . Hypertension   . Breast mass   . Multiple myeloma   . Breast cancer   . Arthritis   . S/P radiation therapy 12/15/12 - 12/21/12    T6 - L1 / 18.75 Gy / 5 Fractions  . Bilateral atelectasis   . Systolic heart failure     EF 20% 11/2013   Past Surgical History  Procedure Laterality Date  . Tonsillectomy    . Modified mastectomy Right 01/25/2013    Procedure: MODIFIED MASTECTOMY;  Surgeon: Benjamin T Hoxworth, MD;  Location: [facility] OR;  Service: General;  Laterality: Right;   Dg Chest 2 View  01/16/2014   CLINICAL DATA:  Increase shortness of breath.  EXAM: CHEST  2 VIEW  COMPARISON:  01/23/2013  FINDINGS: There is increased cardiomegaly with new pulmonary vascular congestion and mild bilateral interstitial pulmonary edema with small bilateral pleural effusions.  There are multiple compression fractures in the thoracic spine with vertebroplasty at T11. No acute compression fractures.  IMPRESSION: New cardiomegaly with pulmonary vascular congestion and mild interstitial edema with small bilateral effusions.   Electronically Signed   By: Jim  Maxwell M.D.   On: 01/16/2014 15:46   Us Abdomen Limited Ruq  01/17/2014   CLINICAL DATA:  Right upper quadrant  abdominal pain, history of hypertension, breast cancer and multiple myeloma  EXAM: US ABDOMEN LIMITED - RIGHT UPPER QUADRANT  COMPARISON:  CT ABD/PELVIS W CM dated 11/11/2012; CT ABD/PELVIS W CM dated 04/01/2010  FINDINGS: Gallbladder:  Sonographically normal. No echogenic gallstones or gall sludge. No gallbladder wall thickening or pericholecystic fluid. Negative sonographic Murphy's sign.  Common bile duct:  Diameter: Normal in size measuring 1.8 mm in diameter.  Liver:  Homogeneous hepatic echotexture. Note is made of an approximately 1.3 x 1.1 x 1.0 cm hyperechoic lesion within the left lobe of the liver (representative images 22 and 24), not definitely seen on prior abdominal CTs. No definite evidence of intrahepatic biliary ductal dilatation. No ascites.  IMPRESSION: Indeterminate approximately 1.3 cm hyperechoic lesion within the left lobe of the liver - while possibly representative of a hepatic hemangioma, this lesion was not definitely identified on prior abdominal CT examinations and given history of breast cancer, metastatic disease is not excluded. As such, further evaluation with nonemergent contrast enhanced abdominal MRI is recommended.  These results will be called to the ordering clinician or representative by the Radiologist Assistant, and communication documented in the PACS Dashboard.   Electronically Signed   By: John  Watts M.D.   On: 01/17/2014 09:05  Results for orders placed during the hospital encounter of 01/16/14    CBC WITH DIFFERENTIAL      Result Value Ref Range   WBC 5.9  4.0 - 10.5 K/uL   RBC 2.82 (*) 3.87 - 5.11 MIL/uL   Hemoglobin 9.0 (*) 12.0 - 15.0 g/dL   HCT 27.4 (*) 36.0 - 46.0 %   MCV 97.2  78.0 - 100.0 fL   MCH 31.9  26.0 - 34.0 pg   MCHC 32.8  30.0 - 36.0 g/dL   RDW 17.4 (*) 11.5 - 15.5 %   Platelets 228  150 - 400 K/uL   Neutrophils Relative % 80 (*) 43 - 77 %   Neutro Abs 4.7  1.7 - 7.7 K/uL   Lymphocytes Relative 12  12 - 46 %   Lymphs Abs 0.7  0.7 - 4.0 K/uL    Monocytes Relative 7  3 - 12 %   Monocytes Absolute 0.4  0.1 - 1.0 K/uL   Eosinophils Relative 0  0 - 5 %   Eosinophils Absolute 0.0  0.0 - 0.7 K/uL   Basophils Relative 0  0 - 1 %   Basophils Absolute 0.0  0.0 - 0.1 K/uL  COMPREHENSIVE METABOLIC PANEL      Result Value Ref Range   Sodium 130 (*) 137 - 147 mEq/L   Potassium 5.5 (*) 3.7 - 5.3 mEq/L   Chloride 93 (*) 96 - 112 mEq/L   CO2 19  19 - 32 mEq/L   Glucose, Bld 138 (*) 70 - 99 mg/dL   BUN 32 (*) 6 - 23 mg/dL   Creatinine, Ser 1.27 (*) 0.50 - 1.10 mg/dL   Calcium 10.6 (*) 8.4 - 10.5 mg/dL   Total Protein 7.8  6.0 - 8.3 g/dL   Albumin 3.2 (*) 3.5 - 5.2 g/dL   AST 41 (*) 0 - 37 U/L   ALT 41 (*) 0 - 35 U/L   Alkaline Phosphatase 142 (*) 39 - 117 U/L   Total Bilirubin 0.6  0.3 - 1.2 mg/dL   GFR calc non Af Amer 39 (*) >90 mL/min   GFR calc Af Amer 45 (*) >90 mL/min  PRO B NATRIURETIC PEPTIDE      Result Value Ref Range   Pro B Natriuretic peptide (BNP) 7214.0 (*) 0 - 450 pg/mL  D-DIMER, QUANTITATIVE      Result Value Ref Range   D-Dimer, Quant 0.58 (*) 0.00 - 0.48 ug/mL-FEU  POTASSIUM      Result Value Ref Range   Potassium 5.6 (*) 3.7 - 5.3 mEq/L  BASIC METABOLIC PANEL      Result Value Ref Range   Sodium 129 (*) 137 - 147 mEq/L   Potassium 5.0  3.7 - 5.3 mEq/L   Chloride 92 (*) 96 - 112 mEq/L   CO2 21  19 - 32 mEq/L   Glucose, Bld 133 (*) 70 - 99 mg/dL   BUN 37 (*) 6 - 23 mg/dL   Creatinine, Ser 1.41 (*) 0.50 - 1.10 mg/dL   Calcium 9.9  8.4 - 10.5 mg/dL   GFR calc non Af Amer 34 (*) >90 mL/min   GFR calc Af Amer 40 (*) >90 mL/min  TROPONIN I      Result Value Ref Range   Troponin I <0.30  <0.30 ng/mL  TROPONIN I      Result Value Ref Range   Troponin I <0.30  <0.30 ng/mL  TROPONIN I      Result Value Ref Range   Troponin I <0.30  <0.30 ng/mL  TSH        Result Value Ref Range   TSH 2.510  0.350 - 4.500 uIU/mL  BASIC METABOLIC PANEL      Result Value Ref Range   Sodium 129 (*) 137 - 147 mEq/L    Potassium 4.7  3.7 - 5.3 mEq/L   Chloride 91 (*) 96 - 112 mEq/L   CO2 24  19 - 32 mEq/L   Glucose, Bld 141 (*) 70 - 99 mg/dL   BUN 56 (*) 6 - 23 mg/dL   Creatinine, Ser 1.83 (*) 0.50 - 1.10 mg/dL   Calcium 9.3  8.4 - 10.5 mg/dL   GFR calc non Af Amer 25 (*) >90 mL/min   GFR calc Af Amer 29 (*) >90 mL/min  CBC      Result Value Ref Range   WBC 4.5  4.0 - 10.5 K/uL   RBC 2.63 (*) 3.87 - 5.11 MIL/uL   Hemoglobin 8.6 (*) 12.0 - 15.0 g/dL   HCT 25.2 (*) 36.0 - 46.0 %   MCV 95.8  78.0 - 100.0 fL   MCH 32.7  26.0 - 34.0 pg   MCHC 34.1  30.0 - 36.0 g/dL   RDW 17.2 (*) 11.5 - 15.5 %   Platelets 195  150 - 400 K/uL  CANCER ANTIGEN 27.29      Result Value Ref Range   CA 27.29 18  0 - 39 U/mL  I-STAT TROPOININ, ED      Result Value Ref Range   Troponin i, poc 0.06  0.00 - 0.08 ng/mL   Comment 3           TYPE AND SCREEN      Result Value Ref Range   ABO/RH(D) O POS     Antibody Screen NEG     Sample Expiration 01/21/2014    ABO/RH      Result Value Ref Range   ABO/RH(D) O POS     A/P: Pt with hx rt breast carcinoma and multiple myeloma admitted recently with CHF. Tent plan is for port a cath placement on 4/3 depending on pt's resp status. Pt feels better since admission/diuresis. Details/risks of procedure d/w pt/son with their understanding and consent.  

## 2014-01-18 NOTE — Evaluation (Signed)
I have reviewed this note and agree with all findings. Kati Anavi Branscum, PT, DPT Pager: [phone]   

## 2014-01-18 NOTE — Progress Notes (Signed)
Chaplain consulted with patient's nurse. Patient in route to radiology. Chaplain will make a follow up visit.   01/18/14 1600  Clinical Encounter Type  Visited With Health care provider  Visit Type Initial

## 2014-01-18 NOTE — Progress Notes (Signed)
Pt to have Port-a-Cath placement tomorrow depending on blood work. Order to hold Lovenox for procedure tomorrow was placed by MD. Pt received Lovenox at 2200 at night. Called Pharmacy and pharmacist said to hold, but restart tomorrow if patient does not have procedure. Will continue to monitor.

## 2014-01-18 NOTE — Progress Notes (Signed)
Patient stated she was feeling very anxious and it was making her SOB. Patient asked if she could have something to help her relax and sleep. MD paged and new orders were given for a one time dose of Xanax 0.5mg . Will continue to monitor patient.

## 2014-01-18 NOTE — Progress Notes (Signed)
Note: This document was prepared with digital dictation and possible smart phrase technology. Any transcriptional errors that result from this process are unintentional.   Allien Melberg MWN:[id number] DOB: [birth year] DOA: 01/16/2014 PCP: No primary provider on file.  Brief narrative: 80 y/o ?, known h/o breast stage IIb (T2N1M0) infiltrating ductal carcinoma of the right breast. She had a mastectomy. She had. 3 positive lymph nodes + XRT/chemo, IgA Kappa MMyeloma on therapy [previously on herceptin/perjeta], systolic HF admitted 01/19/46 with acute decompensation HF with possibility non-compliance on medical therapy contributing-she states that she has had diarrhea on Revlimid for the past 2 months and due to this, wouldn't take all of her medicines regularily Admission weight 59kg Weight 12/21/13 at OP visit 55kg  Past medical history-As per Problem list Chart reviewed as below-   Consultants:  Oncology  Procedures:  Echo P  Abd USG-Indeterminate approximately 1.3 cm hyperechoic lesion within the left lobe of the liver - while possibly representative of a hepatic hemangioma, this lesion was not definitely identified on prior abdominal CT examinations and given history of breast cancer, metastatic disease is not excluded.   Antibiotics:  none   Subjective   SOB better Relates poor Uop for the past 10 months with UI No n/v/cp Feels much better than prior tol diet well Son in room    Objective    Interim History:   Telemetry: sinus   Objective: Filed Vitals:   01/17/14 2033 01/17/14 2135 01/18/14 0453 01/18/14 1525  BP: 88/59 105/81 106/70 99/60  Pulse: 85 80 85 70  Temp: 98.4 F (36.9 C)  97.6 F (36.4 C) 97.6 F (36.4 C)  TempSrc: Oral  Oral Oral  Resp: 22  20 20   Height:      Weight:   58.1 kg (128 lb 1.4 oz)   SpO2: 98%  96% 100%    Intake/Output Summary (Last 24 hours) at 01/18/14 1530 Last data filed at 01/18/14 1500  Gross per 24 hour  Intake     483 ml  Output   1750 ml  Net  -1267 ml    Exam:  General: eomi, ncat Cardiovascular:  s1 s2 no m/r/g Respiratory: clear, no added sound Abdomen:  Soft, nt/nd Skin: grade 1-2 le edema Neuro intact  Data Reviewed: Basic Metabolic Panel:  Recent Labs Lab 01/16/14 1615  01/17/14 0136 01/18/14 0345  NA 130*  --  129* 129*  K 5.5*  < > 5.0 4.7  CL 93*  --  92* 91*  CO2 19  --  21 24  GLUCOSE 138*  --  133* 141*  BUN 32*  --  37* 56*  CREATININE 1.27*  --  1.41* 1.83*  CALCIUM 10.6*  --  9.9 9.3  < > = values in this interval not displayed. Liver Function Tests:  Recent Labs Lab 01/16/14 1615  AST 41*  ALT 41*  ALKPHOS 142*  BILITOT 0.6  PROT 7.8  ALBUMIN 3.2*   No results found for this basename: LIPASE, AMYLASE,  in the last 168 hours No results found for this basename: AMMONIA,  in the last 168 hours CBC:  Recent Labs Lab 01/16/14 1615 01/18/14 0805  WBC 5.9 4.5  NEUTROABS 4.7  --   HGB 9.0* 8.6*  HCT 27.4* 25.2*  MCV 97.2 95.8  PLT 228 195   Cardiac Enzymes:  Recent Labs Lab 01/16/14 2015 01/17/14 0117 01/17/14 0819  TROPONINI <0.30 <0.30 <0.30   BNP: No components found with this basename: POCBNP,  CBG: No results found for this basename: GLUCAP,  in the last 168 hours  No results found for this or any previous visit (from the past 240 hour(s)).   Studies:              All Imaging reviewed and is as per above notation   Scheduled Meds: . aspirin EC  81 mg Oral Daily  . carvedilol  3.125 mg Oral BID WC  . enoxaparin (LOVENOX) injection  30 mg Subcutaneous Q24H  . [START ON 01/19/2014] furosemide  20 mg Oral Daily  . pantoprazole  20 mg Oral Daily  . sodium chloride  3 mL Intravenous Q12H  . valACYclovir  1,000 mg Oral Daily   Continuous Infusions:    Assessment/Plan: 1. Acute respiratory failure 2/2 to decompensated systolic chf 2. Acute decompensated chf-Ef remains 42% systolic-potentially from Herceptin.    Diurese lasix 20 po  daily-strict i/o minus 1.9 liters overnight, daily weights minus 1kg so far.  Continue Coreg 3.125 bid-appreciate cardiology input-hypotension/AKI precludes aggressive diuresis.  Await further input from cardiology 3. AKI-2/2 to IV lasix.  Held overnight but restarted by cardiology.  Am labs 4. Myeloma + Breast Ca-Per oncology. 5. Hyperkalemia-potentially 2/2 to ACE + aldactone which have been held-resolved 6. Hypervolemic hyponatremia, 2./2 to CHF-diurese.  Increase solute in diet.  Stable currently 7. Anemia of maliganancy-refued blood transfusion-aranesp ordered 8. Liver Mass-Hamangioma vs Metastases-defer to oncology regarding MRI abdomen-discusse dwith family 9. Hypercalcemia-discontinue caclium carbonate am  Code Status: Full Family Communication:  Long discussion with son at bedsdie.  Understands. Disposition Plan: Inpatient   Verneita Griffes, MD  [facility] Pager [phone] 01/18/2014, 3:30 PM    LOS: 2 days

## 2014-01-18 NOTE — Consult Note (Signed)
CARDIOLOGY CONSULT NOTE   Patient ID: Jacqueline Rivas MRN: [id number] DOB/AGE: [birth year] 78 y.o.  Admit date: 01/16/2014  Primary Physician   Dr. Elder Cyphers, Dr. Marin Olp Primary Cardiologist    Follow up will be the Heart Failure team. Reason for Consultation  CHF   [id number] Jacqueline Rivas is a 80 y.o. female with no history of CAD.  Her EF was normal in September 2014. She was on Herceptin for breast CA for 1 year, starting in June 2014. In February, she had an echo which showed an EF of 20-25%. Dr. Marin Olp told her she would need a cardiology referral, mentioning Dr. Haroldine Laws. The Herceptin was discontinued.   She was started on Revlimid, instead. She has done well except for diarrhea.   Over the last few weeks, she has been noticing increasing DOE. This week, she describes orthopnea and PND. She would have episodes of SOB that improved with standing up. Finally she came to the ER, and was admitted. Her weight is down 2 lbs since admission and she is breathing/sleeping better (on oxygen). However, she says that she has to stand at different times to help with her shortness of breath. She was on low-dose Coreg in addition to the Lasix but her SBP dropped into the 80s and the Lasix was discontinued. She had not been taking the Coreg (though it was prescribed) prior to admission and so both drugs were new to her.   She has never had chest pain. Until the current symptoms started, she was as active as she was able to be.    Past Medical History  Diagnosis Date  . Diverticulosis   . Hiatal hernia   . Hyperlipidemia   . Hypertension   . Breast mass   . Multiple myeloma   . Breast cancer   . Arthritis   . S/P radiation therapy 12/15/12 - 12/21/12    T6 - L1 / 18.75 Gy / 5 Fractions  . Bilateral atelectasis   . Systolic heart failure     EF 20% 11/2013     Past Surgical History  Procedure Laterality Date  . Tonsillectomy    . Modified mastectomy Right 01/25/2013    Procedure: MODIFIED  MASTECTOMY;  Surgeon: Edward Jolly, MD;  Location: [facility];  Service: General;  Laterality: Right;   No Known Allergies  I have reviewed the patient's current medications . aspirin EC  81 mg Oral Daily  . carvedilol  3.125 mg Oral BID WC  . enoxaparin (LOVENOX) injection  30 mg Subcutaneous Q24H  . pantoprazole  20 mg Oral Daily  . sodium chloride  3 mL Intravenous Q12H  . valACYclovir  1,000 mg Oral Daily     sodium chloride, calcium carbonate, diphenoxylate-atropine, HYDROmorphone, ondansetron, polyethylene glycol, prochlorperazine, sodium chloride  Medication Sig  calcium carbonate (OS-CAL - DOSED IN MG OF ELEMENTAL CALCIUM) 1250 MG tablet Take 1,250 mg by mouth daily as needed (on occasion).   Camphor-Eucalyptus-Menthol (VAPORIZING CHEST RUB EX) Apply 1 application topically daily. Applies to chest as needed  carvedilol (COREG) 3.125 MG tablet Take 1 tablet (3.125 mg total) by mouth 2 (two) times daily with a meal.  Coenzyme Q10 (CO Q-10) 100 MG CAPS Take 100 mg by mouth daily as needed (on occasion).   dexamethasone (DECADRON) 4 MG tablet Take 5 pills once a WEEK with food.  diphenoxylate-atropine (LOMOTIL) 2.5-0.025 MG per tablet Take 1-2 tablets by mouth 4 (four) times daily as needed for diarrhea or loose stools.  famciclovir (FAMVIR) 500 MG tablet Take 1 a day to prevent shingles.  fentaNYL (DURAGESIC - DOSED MCG/HR) 12 MCG/HR Place 12.5 mcg onto the skin as needed. NOT USING AS DIRECTED MAY USE ONCE A WEEK  HYDROmorphone (DILAUDID) 2 MG tablet Take 1 tablet (2 mg total) by mouth every 6 (six) hours as needed for pain (takes along with fentanyl patch).  lansoprazole (PREVACID) 30 MG capsule Take 1 capsule (30 mg total) by mouth daily.  Lenalidomide (REVLIMID) 20 MG CAPS Take 20 mg by mouth every morning. 21 days on 7 days off. Auth # [id number]  MAGNESIUM-ZINC PO Take 1 tablet by mouth daily as needed (constipation).  ondansetron (ZOFRAN) 8 MG tablet Take 1 tablet (8 mg total)  by mouth every 12 (twelve) hours as needed for nausea.  polyethylene glycol (MIRALAX / GLYCOLAX) packet Take 17 g by mouth 2 (two) times daily as needed. For constipation  potassium chloride SA (K-DUR,KLOR-CON) 20 MEQ tablet Take 20 mEq by mouth daily.  prochlorperazine (COMPAZINE) 10 MG tablet Take 1 tablet (10 mg total) by mouth every 6 (six) hours as needed.  triamterene-hydrochlorothiazide (MAXZIDE-25) 37.5-25 MG per tablet TAKE 1 TABLET BY MOUTH ONCE DAILY     History   Social History  . Marital Status: Widowed    Spouse Name: N/A    Number of Children: N/A  . Years of Education: N/A   Occupational History  . Retired    Social History Main Topics  . Smoking status: Never Smoker   . Smokeless tobacco: Never Used     Comment: never used product  . Alcohol Use: No     Comment: heavy alcohol use until 2013  . Drug Use: No  . Sexual Activity: No   Other Topics Concern  . Not on file   Social History Narrative   Has apartment, lives alone.  Uses a cane around apartment.  Has walker. Was driving, but has not driven in > 1 month.    Family Status  Relation Status Death Age  . Father Deceased   . Brother Alive   . Maternal Grandmother Deceased   . Mother Deceased   . Sister Alive    Family History  Problem Relation Age of Onset  . Prostate cancer Father   . Heart attack Brother   . Asthma Maternal Grandmother   . Multiple sclerosis Sister   . Lymphoma Son     large B-cell  . Heart attack Father      ROS:  Full 14 point review of systems complete and found to be negative unless listed above.  Physical Exam: Blood pressure 106/70, pulse 85, temperature 97.6 F (36.4 C), temperature source Oral, resp. rate 20, height _0  (1.6 m), weight 128 lb 1.4 oz (58.1 kg), SpO2 96.00%.  General: Well developed, well nourished, female in no acute distress Head: Eyes PERRLA, No xanthomas.   Normocephalic and atraumatic, oropharynx without edema or exudate. Dentition: pretty  good Lungs: decreased bilateral bases Heart: HRRR S1 S2, no rub/gallop, no murmur. pulses are 2+ all 4 extrem.   Neck: No carotid bruits. No lymphadenopathy.  JVD 12 cm, JVP 12 cm. Abdomen: Bowel sounds present, abdomen soft and non-tender without masses or hernias noted. Msk:  No spine or cva tenderness. No weakness, no joint deformities or effusions. Extremities: No clubbing or cyanosis. 1+ edema.  Neuro: Alert and oriented X 3. No focal deficits noted. Psych:  Good affect, responds appropriately Skin: No rashes or lesions noted.  Labs:  Lab Results  Component Value Date   WBC 4.5 01/18/2014   HGB 8.6* 01/18/2014   HCT 25.2* 01/18/2014   MCV 95.8 01/18/2014   PLT 195 01/18/2014    Recent Labs Lab 01/16/14 1615  01/18/14 0345  NA 130*  < > 129*  K 5.5*  < > 4.7  CL 93*  < > 91*  CO2 19  < > 24  BUN 32*  < > 56*  CREATININE 1.27*  < > 1.83*  CALCIUM 10.6*  < > 9.3  PROT 7.8  --   --   BILITOT 0.6  --   --   ALKPHOS 142*  --   --   ALT 41*  --   --   AST 41*  --   --   GLUCOSE 138*  < > 141*  ALBUMIN 3.2*  --   --   < > = values in this interval not displayed. No results found for this basename: mg    Recent Labs  01/16/14 2015 01/17/14 0117 01/17/14 0819  TROPONINI <0.30 <0.30 <0.30    Recent Labs  01/16/14 1803  TROPIPOC 0.06   Pro B Natriuretic peptide (BNP)  Date/Time Value Ref Range Status  01/16/2014  4:15 PM 7214.0* 0 - 450 pg/mL Final   Lab Results  Component Value Date   DDIMER 0.58* 01/16/2014   TSH  Date/Time Value Ref Range Status  01/16/2014  5:20 PM 2.510  0.350 - 4.500 uIU/mL Final     Please note change in reference range.     Performed at [facility]    Echo: 01/17/2014 Study Conclusions - Left ventricle: The cavity size was moderately dilated. There was mild concentric hypertrophy. Systolic function was normal. The estimated ejection fraction was 20%. Wall motion was normal; there were no regional wall motion abnormalities.  Doppler parameters are consistent with abnormal left ventricular relaxation (grade 1 diastolic dysfunction). Doppler parameters are consistent with elevated ventricular end-diastolic filling pressure. - Aortic valve: Trileaflet; normal thickness leaflets. Mild regurgitation. - Aortic root: The aortic root was normal in size. - Mitral valve: Structurally normal valve. Transvalvular velocity was within the normal range. There was no evidence for stenosis. Moderate regurgitation directed posteriorly. - Right ventricle: The cavity size was mildly dilated. Wall thickness was normal. Systolic function was normal. - Right atrium: The atrium was normal in size. - Tricuspid valve: Moderate regurgitation. - Pulmonic valve: Trivial regurgitation. - Pulmonary arteries: Systolic pressure was within the normal range. - Inferior vena cava: The vessel was normal in size.  11/21/2013 Study Conclusions - Left ventricle: The cavity size was mildly dilated. Wall thickness was normal. The estimated ejection fraction was 20%. Diffuse hypokinesis. Doppler parameters are consistent with abnormal left ventricular relaxation (grade 1 diastolic dysfunction). Globally abnormal LV strain pattern. - Aortic valve: There was no stenosis. Trivial regurgitation. - Mitral valve: Tethered posterior leaflet. Mildly calcified annulus. Mild to moderate regurgitation. - Left atrium: The atrium was moderately dilated. - Right ventricle: The cavity size was normal. Systolic function was mildly reduced. - Tricuspid valve: Peak RV-RA gradient: 22m Hg (S). - Pulmonary arteries: PA peak pressure: 440mHg (S). - Systemic veins: IVC measured 2.4 cm with < 50% respirophasic variation, suggesting RA pressure 15 mmHg. Impressions: - Mildly dilated LV with severe global hypokinesis, EF 20%. Normal RV size with mildly decreased systolic function. Mild to moderate MR with tethered posterior leaflet. Moderate LAE. Mild pulmonary  hypertension.  ECG:  01/16/2014 Vent. rate 104 BPM  PR interval 182 ms QRS duration 88 ms QT/QTc 373/491 ms P-R-T axes 1 -61 100  Radiology:  Dg Chest 2 View 01/16/2014   CLINICAL DATA:  Increase shortness of breath.  EXAM: CHEST  2 VIEW  COMPARISON:  01/23/2013  FINDINGS: There is increased cardiomegaly with new pulmonary vascular congestion and mild bilateral interstitial pulmonary edema with small bilateral pleural effusions.  There are multiple compression fractures in the thoracic spine with vertebroplasty at T11. No acute compression fractures.  IMPRESSION: New cardiomegaly with pulmonary vascular congestion and mild interstitial edema with small bilateral effusions.   Electronically Signed   By: Rozetta Nunnery M.D.   On: 01/16/2014 15:46   US Abdomen Limited Ruq 01/17/2014   CLINICAL DATA:  Right upper quadrant abdominal pain, history of hypertension, breast cancer and multiple myeloma  EXAM: US ABDOMEN LIMITED - RIGHT UPPER QUADRANT  COMPARISON:  CT ABD/PELVIS W CM dated 11/11/2012; CT ABD/PELVIS W CM dated 04/01/2010  FINDINGS: Gallbladder:  Sonographically normal. No echogenic gallstones or gall sludge. No gallbladder wall thickening or pericholecystic fluid. Negative sonographic Murphy's sign.  Common bile duct:  Diameter: Normal in size measuring 1.8 mm in diameter.  Liver:  Homogeneous hepatic echotexture. Note is made of an approximately 1.3 x 1.1 x 1.0 cm hyperechoic lesion within the left lobe of the liver (representative images 22 and 24), not definitely seen on prior abdominal CTs. No definite evidence of intrahepatic biliary ductal dilatation. No ascites.  IMPRESSION: Indeterminate approximately 1.3 cm hyperechoic lesion within the left lobe of the liver - while possibly representative of a hepatic hemangioma, this lesion was not definitely identified on prior abdominal CT examinations and given history of breast cancer, metastatic disease is not excluded. As such, further evaluation with  nonemergent contrast enhanced abdominal MRI is recommended.  These results will be called to the ordering clinician or representative by the Radiologist Assistant, and communication documented in the PACS Dashboard.   Electronically Signed   By: Sandi Mariscal M.D.   On: 01/17/2014 09:05    ASSESSMENT AND PLAN:   The patient was seen today by Dr. Ron Parker,  his thoughts are reflected in the updated problem list below.   Overall this is a very difficult situation. The patient does not have a good understanding of her medicines. It is difficult to know how compliant she can be. I spoke with her about salt and fluid intake and limiting them. I'm not sure that she truly understands. Her son was in the room at the time of the evaluation. However he lives in [place] and cannot help oversee her care.     Acute on chronic systolic CHF. we know that she had normal left ventricular function in 2014. By February, 2015 her ejection fraction was 20%. Herceptin was stopped at that time. Unfortunately her ejection fraction continues in the range of 20% at this time.  She has evidence of volume overload by exam but her BUN/Cr elevated with diuresis. This plus hypotension led to discontinuation of the Lasix. She may have low-output state, not sure she is a candidate for advanced therapies.   I have spoken with the heart failure team. At this point we have 2 options. One is to transfer the patient to [facility] to be under the service of the heart failure team. The other is to try to optimize her meds at [facility] and allow for her to be discharged with very early post hospital followup in the heart failure clinic. At this point  it appears that the second option is the best. Her creatinine was 1.2 on admission. It does appear that she diuresed. Her creatinine has gone up to 1.8. It may turn out that she will function best with a creatinine in the range of 1.5. Even though her diuretic was put on hold, I have chosen to restarted  at a low dose orally. Her blood pressure is too low to tolerate increasing her carvedilol dose. With her renal dysfunction she is not a candidate for ACE inhibitors or ARB's. I've ordered a two-view chest x-ray for this afternoon. After it is done it can be compared with her admission chest x-ray. If her chest x-ray has cleared and if she begins to be able to ambulate, she could be discharged home on home O2 along with a small dose of Lasix and a small dose of carvedilol. It would be extremely important that she have an appointment at the heart failure clinic within 2 or 3 days from her day of discharge. This patient will need to be followed by the heart failure clinic. She should not be sent to my post hospital clinic.     If the patient does not stabilize enough for discharge at [facility], she should be transferred to the heart failure service at [facility].      HYPONATREMIA - follow, she is on 1500 cc fluid restriction    Serum calcium elevated - improved, per IM    Shingles outbreak - per IM    Multiple myeloma, without mention of having achieved remission(203.00) - per oncology/IM    Breast cancer metastasized to axillary lymph node - per oncology/IM    Chronic kidney disease (CKD), stage III (moderate) - BUN/Cr elevated with diuresis    Hyperkalemia - improved w/ IV Lasix    DNR - may be appropriate to discuss aggressiveness of care with her. Her son should be present.    Anemia      hemoglobin 8.6. This plays an additional role in her heart failure.   SignedRosaria Ferries, PA-C 01/18/2014 1:48 PM Beeper [phone] Patient seen and examined. I agree with the assessment and plan as detailed above. See also my additional thoughts below.   My assessment is included in the assessment above. As part of this evaluation I completely reviewed all the records. I spoke at length in the room with the patient and her son. I called and spoke with Dr. Aundra Dubin of the heart failure team to decide  on a plan for this lady. I spent greater than an hour on her overall care.  Dola Argyle, MD, [facility] 01/18/2014 2:57 PM

## 2014-01-19 ENCOUNTER — Inpatient Hospital Stay (HOSPITAL_COMMUNITY): Payer: Medicare Other

## 2014-01-19 DIAGNOSIS — K7689 Other specified diseases of liver: Secondary | ICD-10-CM | POA: Diagnosis not present

## 2014-01-19 DIAGNOSIS — N289 Disorder of kidney and ureter, unspecified: Secondary | ICD-10-CM

## 2014-01-19 DIAGNOSIS — I428 Other cardiomyopathies: Secondary | ICD-10-CM | POA: Diagnosis not present

## 2014-01-19 DIAGNOSIS — J90 Pleural effusion, not elsewhere classified: Secondary | ICD-10-CM | POA: Diagnosis not present

## 2014-01-19 DIAGNOSIS — C50919 Malignant neoplasm of unspecified site of unspecified female breast: Secondary | ICD-10-CM | POA: Diagnosis not present

## 2014-01-19 DIAGNOSIS — I5023 Acute on chronic systolic (congestive) heart failure: Secondary | ICD-10-CM | POA: Diagnosis not present

## 2014-01-19 DIAGNOSIS — C9000 Multiple myeloma not having achieved remission: Secondary | ICD-10-CM | POA: Diagnosis not present

## 2014-01-19 DIAGNOSIS — I509 Heart failure, unspecified: Secondary | ICD-10-CM | POA: Diagnosis not present

## 2014-01-19 LAB — CBC
HEMATOCRIT: 24.3 % — AB (ref 36.0–46.0)
HEMOGLOBIN: 8 g/dL — AB (ref 12.0–15.0)
MCH: 32.1 pg (ref 26.0–34.0)
MCHC: 32.9 g/dL (ref 30.0–36.0)
MCV: 97.6 fL (ref 78.0–100.0)
Platelets: 177 10*3/uL (ref 150–400)
RBC: 2.49 MIL/uL — ABNORMAL LOW (ref 3.87–5.11)
RDW: 17.3 % — ABNORMAL HIGH (ref 11.5–15.5)
WBC: 4.9 10*3/uL (ref 4.0–10.5)

## 2014-01-19 LAB — COMPREHENSIVE METABOLIC PANEL
ALT: 59 U/L — ABNORMAL HIGH (ref 0–35)
AST: 46 U/L — ABNORMAL HIGH (ref 0–37)
Albumin: 2.9 g/dL — ABNORMAL LOW (ref 3.5–5.2)
Alkaline Phosphatase: 127 U/L — ABNORMAL HIGH (ref 39–117)
BUN: 55 mg/dL — ABNORMAL HIGH (ref 6–23)
CALCIUM: 9.1 mg/dL (ref 8.4–10.5)
CO2: 23 meq/L (ref 19–32)
CREATININE: 1.58 mg/dL — AB (ref 0.50–1.10)
Chloride: 90 mEq/L — ABNORMAL LOW (ref 96–112)
GFR, EST AFRICAN AMERICAN: 35 mL/min — AB (ref >90)
GFR, EST NON AFRICAN AMERICAN: 30 mL/min — AB (ref >90)
Glucose, Bld: 145 mg/dL — ABNORMAL HIGH (ref 70–99)
Potassium: 4.1 mEq/L (ref 3.7–5.3)
Sodium: 128 mEq/L — ABNORMAL LOW (ref 137–147)
TOTAL PROTEIN: 6.4 g/dL (ref 6.0–8.3)
Total Bilirubin: 0.4 mg/dL (ref 0.3–1.2)

## 2014-01-19 LAB — PROTIME-INR
INR: 1.08 (ref 0.00–1.49)
PROTHROMBIN TIME: 13.8 s (ref 11.6–15.2)

## 2014-01-19 LAB — PREPARE RBC (CROSSMATCH)

## 2014-01-19 LAB — APTT: aPTT: 25 seconds (ref 24–37)

## 2014-01-19 MED ORDER — FENTANYL CITRATE 0.05 MG/ML IJ SOLN
INTRAMUSCULAR | Status: AC
  Filled 2014-01-19: qty 6

## 2014-01-19 MED ORDER — ACETAMINOPHEN 325 MG PO TABS
650.0000 mg | ORAL_TABLET | Freq: Once | ORAL | Status: AC
  Administered 2014-01-19: 650 mg via ORAL
  Filled 2014-01-19: qty 2

## 2014-01-19 MED ORDER — LIDOCAINE HCL 1 % IJ SOLN
INTRAMUSCULAR | Status: AC
  Filled 2014-01-19: qty 20

## 2014-01-19 MED ORDER — MIDAZOLAM HCL 2 MG/2ML IJ SOLN
INTRAMUSCULAR | Status: AC
  Filled 2014-01-19: qty 6

## 2014-01-19 MED ORDER — FENTANYL CITRATE 0.05 MG/ML IJ SOLN
INTRAMUSCULAR | Status: AC | PRN
  Administered 2014-01-19: 25 ug via INTRAVENOUS

## 2014-01-19 MED ORDER — MIDAZOLAM HCL 2 MG/2ML IJ SOLN
INTRAMUSCULAR | Status: AC | PRN
  Administered 2014-01-19 (×2): 0.5 mg via INTRAVENOUS

## 2014-01-19 MED ORDER — HEPARIN SOD (PORK) LOCK FLUSH 100 UNIT/ML IV SOLN
INTRAVENOUS | Status: AC
  Filled 2014-01-19: qty 5

## 2014-01-19 MED ORDER — FUROSEMIDE 10 MG/ML IJ SOLN
30.0000 mg | Freq: Once | INTRAMUSCULAR | Status: AC
  Administered 2014-01-19: 30 mg via INTRAVENOUS

## 2014-01-19 MED ORDER — SORBITOL 70 % SOLN
20.0000 mL | Freq: Every day | Status: DC | PRN
  Administered 2014-01-21: 20 mL via ORAL
  Filled 2014-01-19: qty 30

## 2014-01-19 MED ORDER — CEFAZOLIN SODIUM-DEXTROSE 2-3 GM-% IV SOLR
INTRAVENOUS | Status: AC
  Administered 2014-01-19: 2 g via INTRAVENOUS
  Filled 2014-01-19: qty 50

## 2014-01-19 NOTE — Progress Notes (Signed)
Jacqueline Rivas is doing better. She does feel tired. Her hemoglobin is only 8. We will have to transfuse her. I really believe this will make her feel better. I think it will help her cardiac status. I talked to her at length about blood transfusion. I explained to her how we do a blood transfusion. I explained to her the complications. I told her that the risk of any infection with HIV or hepatitis is 1/40,000 units. I told her that we would match her blood up as close as possible. We will know what her blood type is.  She understands this. She also understands how the blood will be given to help her heart function and make her feel better. She agrees to the blood transfusion.  I very much appreciate cardiology helping out. She will be followed in the heart failure clinic.  I also appreciate physical therapy helping out.  She goes for her Port-A-Cath placement today. This, I believe, will make life a lot easier for everybody in the future.  Her renal function is a little bit better.  Her calcium is slightly on the higher side. This may be somewhat reflective of diuresis.  Her CA 27.29 was 18.  She is a little more hungrier.  She's had no nausea vomiting. She's not complaining of pain.   Her vital signs show her blood pressure be 108/55. Pulse 78. She's afebrile. Her lungs sound clear. There may be some slight crackles bilaterally. Cardiac exam regular in rhythm with no murmurs rubs or bruits. Abdomen is soft. She has good bowel sounds. Extremities shows 1+ edema. Neurological exam shows no focal neurological deficits.  She is still not ready for discharge from my point of view. She is the Port-A-Cath. She is to be transfused. We'll probably need to watch her for a day or 2 after the transfusion.  Again, I very much appreciate everybody's help in taking care of her. The nurses up on [facility] have been fantastic. She really likes the staff on  4[facility]!!!  Ramond Dial 24:6-7

## 2014-01-19 NOTE — Progress Notes (Signed)
Clinical Social Work Department CLINICAL SOCIAL WORK PLACEMENT NOTE 01/19/2014  Patient:  Jacqueline Rivas, Jacqueline Rivas  Account Number:  [id number] Admit date:  01/16/2014  Clinical Social Worker:  Renold Genta  Date/time:  01/19/2014 10:55 AM  Clinical Social Work is seeking post-discharge placement for this patient at the following level of care:   [place]   (*CSW will update this form in Epic as items are completed)   01/19/2014  Patient/family provided with [place] Department of Clinical Social Work's list of facilities offering this level of care within the geographic area requested by the patient (or if unable, by the patient's family).  01/19/2014  Patient/family informed of their freedom to choose among providers that offer the needed level of care, that participate in Medicare, Medicaid or managed care program needed by the patient, have an available bed and are willing to accept the patient.  01/19/2014  Patient/family informed of [facility]' ownership interest in [facility], as well as of the fact that they are under no obligation to receive care at this facility.  PASARR submitted to EDS on 01/19/2014 PASARR number received from EDS on 01/19/2014  FL2 transmitted to all facilities in geographic area requested by pt/family on  01/19/2014 FL2 transmitted to all facilities within larger geographic area on   Patient informed that his/her managed care company has contracts with or will negotiate with  certain facilities, including the following:     Patient/family informed of bed offers received:  01/19/2014 Patient chooses bed at [facility] Physician recommends and patient chooses bed at    Patient to be transferred to [place] on   Patient to be transferred to facility by   The following physician request were entered in Epic:   Additional Comments:   Raynaldo Opitz, [place] Worker cell #: [phone]

## 2014-01-19 NOTE — Procedures (Signed)
LIJV PAC tip SVC RA No comp

## 2014-01-19 NOTE — Progress Notes (Signed)
PT Cancellation Note  Patient Details Name: Jacqueline Rivas MRN: [id number] DOB: 29-Mar-1932   Cancelled Treatment:    Reason Eval/Treat Not Completed: Patient at procedure or test/unavailable Pt receiving PRBCs this morning and too fatigued to participate.  Pt currently at procedure.  Will check back as schedule permits.   Mathias Bogacki,KATHrine E 01/19/2014, 2:39 PM Carmelia Bake, PT, DPT 01/19/2014 Pager: [phone]

## 2014-01-19 NOTE — Progress Notes (Signed)
Chaplain made a follow up visit. Chaplain consulted with patient's nurse. Patient was taken to get a port placed in per the nurse. Chaplain will follow up.   01/19/14 1500  Clinical Encounter Type  Visited With Health care provider

## 2014-01-19 NOTE — Progress Notes (Signed)
SUBJECTIVE: No SOB. NO chest pain.   BP 108/65  Pulse 78  Temp(Src) 97.5 F (36.4 C) (Oral)  Resp 18  Ht 5\' 3"  (1.6 m)  Wt 128 lb 12.8 oz (58.423 kg)  BMI 22.82 kg/m2  SpO2 98%  Intake/Output Summary (Last 24 hours) at 01/19/14 2725 Last data filed at 01/19/14 0600  Gross per 24 hour  Intake    483 ml  Output   1550 ml  Net  -1067 ml    PHYSICAL EXAM General: Well developed, well nourished, in no acute distress. Alert and oriented x 3.  Psych:  Good affect, responds appropriately Neck: No JVD. No masses noted.  Lungs: Clear bilaterally with no wheezes or rhonci noted.  Heart: RRR with no murmurs noted. Abdomen: Bowel sounds are present. Soft, non-tender.  Extremities: No lower extremity edema.   LABS: Basic Metabolic Panel:  Recent Labs  01/18/14 0345 01/19/14 0355  NA 129* 128*  K 4.7 4.1  CL 91* 90*  CO2 24 23  GLUCOSE 141* 145*  BUN 56* 55*  CREATININE 1.83* 1.58*  CALCIUM 9.3 9.1   CBC:  Recent Labs  01/16/14 1615 01/18/14 0805 01/19/14 0355  WBC 5.9 4.5 4.9  NEUTROABS 4.7  --   --   HGB 9.0* 8.6* 8.0*  HCT 27.4* 25.2* 24.3*  MCV 97.2 95.8 97.6  PLT 228 195 177   Cardiac Enzymes:  Recent Labs  01/16/14 2015 01/17/14 0117 01/17/14 0819  TROPONINI <0.30 <0.30 <0.30   Current Meds: . aspirin EC  81 mg Oral Daily  . carvedilol  3.125 mg Oral BID WC  .  ceFAZolin (ANCEF) IV  2 g Intravenous On Call  . enoxaparin (LOVENOX) injection  30 mg Subcutaneous Q24H  . furosemide  20 mg Oral Daily  . pantoprazole  20 mg Oral Daily  . sodium chloride  3 mL Intravenous Q12H  . valACYclovir  1,000 mg Oral Daily   Echo 01/17/14: Left ventricle: The cavity size was moderately dilated. There was mild concentric hypertrophy. Systolic function was normal. The estimated ejection fraction was 20%. Wall motion was normal; there were no regional wall motion abnormalities. Doppler parameters are consistent with abnormal left ventricular relaxation  (grade 1 diastolic dysfunction). Doppler parameters are consistent with elevated ventricular end-diastolic filling pressure. - Aortic valve: Trileaflet; normal thickness leaflets. Mild regurgitation. - Aortic root: The aortic root was normal in size. - Mitral valve: Structurally normal valve. Transvalvular velocity was within the normal range. There was no evidence for stenosis. Moderate regurgitation directed posteriorly. - Right ventricle: The cavity size was mildly dilated. Wall thickness was normal. Systolic function was normal. - Right atrium: The atrium was normal in size. - Tricuspid valve: Moderate regurgitation. - Pulmonic valve: Trivial regurgitation. - Pulmonary arteries: Systolic pressure was within the normal range. - Inferior vena cava: The vessel was normal in size.   ASSESSMENT AND PLAN: 80 yo female with breast cancer and now with cardiomyopathy (LVEF=20%) following chemotherapy admitted with CHF, renal failure.  1. NICM: She is not known to have CAD. It is presumed that her cardiomyopathy is due to chemotherapy. She may need a stress myoview as an outpatient to exclude ischemia.   2. Acute systolic CHF: See long detailed cardiology consult note yesterday by Dr. Ron Parker. He has discussed this case with the CHF team and options included transferring to [facility] to be seen by the CHF team or attempt to manage her with low dose Lasix and Coreg here  and plan early f/u in CHF clinic. Low dose lasix and low dose Coreg have been started. She is tolerating well. Renal function slightly improved today. Net negative 1.1 liters over last 24 hours and 2.4 liters since admission. She has not been started on Ace-inh/ARB due to renal insufficiency. Chest x-ray 01/18/14 with resolution of pulmonary edema.  -She seems to be stable on this medication regimen. Renal function has improved. Clinically much improved. Would continue this regimen today. She will probably be ready for d/c home in next 24-48  hours. Her follow up will need to be in the CHF clinic within 3-4 days post discharge.     Jacqueline Rivas  4/3/20157:21 AM

## 2014-01-19 NOTE — Progress Notes (Signed)
Note: This document was prepared with digital dictation and possible smart phrase technology. Any transcriptional errors that result from this process are unintentional.   Jacqueline Rivas ZJI:[id number] DOB: 11-22-1931 DOA: 01/16/2014 PCP: No primary provider on file.  Brief narrative: 80 y/o ?, known h/o breast stage IIb (T2N1M0) infiltrating ductal carcinoma of the right breast. She had a mastectomy. She had. 3 positive lymph nodes + XRT/chemo, IgA Kappa MMyeloma on therapy [previously on herceptin/perjeta], systolic HF admitted 1/75/10 with acute decompensation HF with possibility non-compliance on medical therapy contributing-she states that she has had diarrhea on Revlimid for the past 2 months and due to this, wouldn't take all of her medicines regularily Admission weight 59kg Weight 12/21/13 at OP visit 55kg She developed some AKI and as such, because of decreased EF, Cardiology was consulted to see her  Past medical history-As per Problem list Chart reviewed as below-   Consultants:  Oncology  Cardiology  Procedures:  Echo 01/18/14  Abd USG-Indeterminate approximately 1.3 cm hyperechoic lesion within the left lobe of the liver - while possibly representative of a hepatic hemangioma, this lesion was not definitely identified on prior abdominal CT examinations and given history of breast cancer, metastatic disease is not excluded.   Antibiotics:  none   Subjective   SOB better No other real concern Still n.p.o. For procedures Getting unit of packed red blood cells right now No stool in 7 days    Objective    Interim History:   Telemetry: sinus   Objective: Filed Vitals:   01/19/14 0410 01/19/14 1000 01/19/14 1010 01/19/14 1025  BP:  106/64 102/65 101/59  Pulse:  70 68 68  Temp:  97.5 F (36.4 C) 97.5 F (36.4 C) 97.5 F (36.4 C)  TempSrc:  Oral Oral Oral  Resp:  18 18 18   Height:      Weight: 58.423 kg (128 lb 12.8 oz)     SpO2:  98% 100% 100%     Intake/Output Summary (Last 24 hours) at 01/19/14 1331 Last data filed at 01/19/14 1010  Gross per 24 hour  Intake    828 ml  Output   1050 ml  Net   -222 ml    Exam:  General: eomi, ncat Cardiovascular:  s1 s2 no m/r/g Respiratory: clear, no added sound Abdomen:  Soft, nt/nd Skin: grade 1-2 le edema Neuro intact  Data Reviewed: Basic Metabolic Panel:  Recent Labs Lab 01/16/14 1615  01/17/14 0136 01/18/14 0345 01/19/14 0355  NA 130*  --  129* 129* 128*  K 5.5*  < > 5.0 4.7 4.1  CL 93*  --  92* 91* 90*  CO2 19  --  21 24 23   GLUCOSE 138*  --  133* 141* 145*  BUN 32*  --  37* 56* 55*  CREATININE 1.27*  --  1.41* 1.83* 1.58*  CALCIUM 10.6*  --  9.9 9.3 9.1  < > = values in this interval not displayed. Liver Function Tests:  Recent Labs Lab 01/16/14 1615 01/19/14 0355  AST 41* 46*  ALT 41* 59*  ALKPHOS 142* 127*  BILITOT 0.6 0.4  PROT 7.8 6.4  ALBUMIN 3.2* 2.9*   No results found for this basename: LIPASE, AMYLASE,  in the last 168 hours No results found for this basename: AMMONIA,  in the last 168 hours CBC:  Recent Labs Lab 01/16/14 1615 01/18/14 0805 01/19/14 0355  WBC 5.9 4.5 4.9  NEUTROABS 4.7  --   --   HGB 9.0*  8.6* 8.0*  HCT 27.4* 25.2* 24.3*  MCV 97.2 95.8 97.6  PLT 228 195 177   Cardiac Enzymes:  Recent Labs Lab 01/16/14 2015 01/17/14 0117 01/17/14 0819  TROPONINI <0.30 <0.30 <0.30   BNP: No components found with this basename: POCBNP,  CBG: No results found for this basename: GLUCAP,  in the last 168 hours  No results found for this or any previous visit (from the past 240 hour(s)).   Studies:              All Imaging reviewed and is as per above notation   Scheduled Meds: . aspirin EC  81 mg Oral Daily  . carvedilol  3.125 mg Oral BID WC  .  ceFAZolin (ANCEF) IV  2 g Intravenous On Call  . enoxaparin (LOVENOX) injection  30 mg Subcutaneous Q24H  . furosemide  30 mg Intravenous Once  . furosemide  20 mg Oral Daily   . pantoprazole  20 mg Oral Daily  . sodium chloride  3 mL Intravenous Q12H  . valACYclovir  1,000 mg Oral Daily   Continuous Infusions:    Assessment/Plan: 1. Acute respiratory failure 2/2 to decompensated systolic chf-resolved.  Ambulate in halls after transfusion, ambulatory sats as no prior O2 requirement 2. Acute decompensated chf-Ef remains 34% systolic-potentially from Herceptin.    Diurese lasix 20 po daily-strict i/o minus 1.9 liters  daily weights minus 1.5 kg so far.  Continue Coreg 3.125 bid-appreciate cardiology input-hypotension/AKI precludes aggressive diuresis.  Will attempt close OP f/o with CHF Md in 2-3 days 3. AKI-2/2 to IV lasix.  Held overnight but restarted by cardiology.  Kidney function slowly recovering 4. Myeloma + Breast Ca-Per oncology. 5. Hyperkalemia-potentially 2/2 to ACE + aldactone which have been held-resolved Hypervolemic hyponatremia, 2./2 to CHF ? CO ??? ? EAV-diurese.  Increase solute in diet when able to eat.  Add salt tabs, liberalize diet, fluid restrict.  If worse will work-up   6. Anemia of maliganancy-transfused blood 01/19/14-aranesp ordered x 1 01/18/14 7. Liver Mass-Hamangioma vs Metastases-defer to oncology 8. Hypercalcemia-discontinue caclium carbonate am-Corrected Ca no 9.9 and probably unrelated to malignancy or other causes  Code Status: Full Family Communication:  Long discussion with son on ? # fr [place], [place] [phone] Disposition Plan: Inpatient   Verneita Griffes, MD  [facility] Pager [phone] 01/19/2014, 1:31 PM    LOS: 3 days

## 2014-01-19 NOTE — Progress Notes (Signed)
Clinical Social Work Department BRIEF PSYCHOSOCIAL ASSESSMENT 01/19/2014  Patient:  JOVANNI, ECKHART     Account Number:  [id number]     Admit date:  01/16/2014  Clinical Social Worker:  Renold Genta  Date/Time:  01/19/2014 10:50 AM  Referred by:  Physician  Date Referred:  01/19/2014 Referred for  SNF Placement   Other Referral:   Interview type:  Patient Other interview type:    PSYCHOSOCIAL DATA Living Status:  ALONE Admitted from facility:   Level of care:   Primary support name:  Kayci [place] (son) ph#: [phone] Primary support relationship to patient:  CHILD, ADULT Degree of support available:   good    CURRENT CONCERNS Current Concerns  Post-Acute Placement   Other Concerns:    SOCIAL WORK ASSESSMENT / PLAN CSW received consult for SNF placemement, reviewed PT evaluation & recommendation of SNF at discharge.   Assessment/plan status:  Information/Referral to [employer] Other assessment/ plan:   Information/referral to community resources:   CSW completed FL2 and faxed information out to [facility] - provided list of facilities.    PATIENT'S/FAMILY'S RESPONSE TO PLAN OF CARE: Patient informed CSW that she lives alone in a condo and that she has 3 sons - 2 that live in [place] and 1 that lives in [place]. She has a friend in town that is 67 years old who keeps an eye on her as well. Patient expressed interest in [facility], [facility] and [place] - though she states that [facility] would be closest to her. CSW confirmed with [facility] that they would be able to take patient over the weekend.       Raynaldo Opitz, [facility] Clinical Social Worker cell #: [phone]

## 2014-01-19 NOTE — Progress Notes (Signed)
Writer paged Dr.Singh to alert of pt's arrival to room 1438.

## 2014-01-19 NOTE — Progress Notes (Signed)
Previous note incorrect. Wrong pt.

## 2014-01-19 NOTE — Discharge Instructions (Signed)

## 2014-01-20 ENCOUNTER — Encounter (HOSPITAL_COMMUNITY): Payer: Self-pay | Admitting: Hematology & Oncology

## 2014-01-20 DIAGNOSIS — I5023 Acute on chronic systolic (congestive) heart failure: Secondary | ICD-10-CM | POA: Diagnosis not present

## 2014-01-20 DIAGNOSIS — R0602 Shortness of breath: Secondary | ICD-10-CM

## 2014-01-20 DIAGNOSIS — C9000 Multiple myeloma not having achieved remission: Secondary | ICD-10-CM | POA: Diagnosis not present

## 2014-01-20 DIAGNOSIS — E46 Unspecified protein-calorie malnutrition: Secondary | ICD-10-CM | POA: Diagnosis not present

## 2014-01-20 DIAGNOSIS — I509 Heart failure, unspecified: Secondary | ICD-10-CM | POA: Diagnosis not present

## 2014-01-20 DIAGNOSIS — E43 Unspecified severe protein-calorie malnutrition: Secondary | ICD-10-CM

## 2014-01-20 HISTORY — DX: Unspecified severe protein-calorie malnutrition: E43

## 2014-01-20 LAB — CBC
HEMATOCRIT: 33.1 % — AB (ref 36.0–46.0)
Hemoglobin: 10.9 g/dL — ABNORMAL LOW (ref 12.0–15.0)
MCH: 31.5 pg (ref 26.0–34.0)
MCHC: 32.9 g/dL (ref 30.0–36.0)
MCV: 95.7 fL (ref 78.0–100.0)
Platelets: 176 10*3/uL (ref 150–400)
RBC: 3.46 MIL/uL — ABNORMAL LOW (ref 3.87–5.11)
RDW: 18.3 % — AB (ref 11.5–15.5)
WBC: 5.3 10*3/uL (ref 4.0–10.5)

## 2014-01-20 LAB — RENAL FUNCTION PANEL
ALBUMIN: 3 g/dL — AB (ref 3.5–5.2)
BUN: 50 mg/dL — AB (ref 6–23)
CO2: 25 mEq/L (ref 19–32)
Calcium: 9.5 mg/dL (ref 8.4–10.5)
Chloride: 93 mEq/L — ABNORMAL LOW (ref 96–112)
Creatinine, Ser: 1.59 mg/dL — ABNORMAL HIGH (ref 0.50–1.10)
GFR calc non Af Amer: 30 mL/min — ABNORMAL LOW (ref >90)
GFR, EST AFRICAN AMERICAN: 35 mL/min — AB (ref >90)
Glucose, Bld: 140 mg/dL — ABNORMAL HIGH (ref 70–99)
PHOSPHORUS: 3.3 mg/dL (ref 2.3–4.6)
POTASSIUM: 3.4 meq/L — AB (ref 3.7–5.3)
SODIUM: 134 meq/L — AB (ref 137–147)

## 2014-01-20 MED ORDER — ASPIRIN EC 325 MG PO TBEC
325.0000 mg | DELAYED_RELEASE_TABLET | Freq: Every day | ORAL | Status: DC
  Administered 2014-01-20 – 2014-01-22 (×3): 325 mg via ORAL
  Filled 2014-01-20 (×3): qty 1

## 2014-01-20 MED ORDER — ALPRAZOLAM 0.5 MG PO TABS
0.5000 mg | ORAL_TABLET | Freq: Once | ORAL | Status: AC
  Administered 2014-01-20: 0.5 mg via ORAL
  Filled 2014-01-20: qty 1

## 2014-01-20 MED ORDER — SODIUM CHLORIDE 0.9 % IJ SOLN
10.0000 mL | Freq: Two times a day (BID) | INTRAMUSCULAR | Status: DC
  Administered 2014-01-22: 10 mL

## 2014-01-20 MED ORDER — SODIUM CHLORIDE 0.9 % IJ SOLN
10.0000 mL | INTRAMUSCULAR | Status: DC | PRN
  Administered 2014-01-20 – 2014-01-22 (×4): 10 mL

## 2014-01-20 NOTE — Progress Notes (Signed)
Patient ID: Jacqueline Rivas, female   DOB: February 03, 1932, 80 y.o.   MRN: [id number]      Subjective:    SOB improving but still with significant DOE early this morning with ambulation per her report.   Objective:   Temp:  [97.3 F (36.3 C)-97.6 F (36.4 C)] 97.4 F (36.3 C) (04/04 0637) Pulse Rate:  [60-78] 76 (04/04 0637) Resp:  [10-19] 16 (04/04 0637) BP: (98-114)/(56-78) 114/78 mmHg (04/04 0637) SpO2:  [95 %-100 %] 95 % (04/04 0254) Weight:  [129 lb 12.8 oz (58.877 kg)] 129 lb 12.8 oz (58.877 kg) (04/04 0637) Last BM Date: 01/19/14  Filed Weights   01/18/14 0453 01/19/14 0410 01/20/14 2706  Weight: 128 lb 1.4 oz (58.1 kg) 128 lb 12.8 oz (58.423 kg) 129 lb 12.8 oz (58.877 kg)    Intake/Output Summary (Last 24 hours) at 01/20/14 0753 Last data filed at 01/20/14 2376  Gross per 24 hour  Intake   1413 ml  Output   1800 ml  Net   -387 ml    Telemetry: NSR, short runs of PSVT  Exam:  General:NAD  Resp: CTAB  Cardiac: RRR, no m/r/g, + JVD to angle of jaw  GI: abdomen soft, NT, ND  MSK: extremities are warm, no edema  Neuro: no focal deficits   Lab Results:  Basic Metabolic Panel:  Recent Labs Lab 01/18/14 0345 01/19/14 0355 01/20/14 0520  NA 129* 128* 134*  K 4.7 4.1 3.4*  CL 91* 90* 93*  CO2 24 23 25   GLUCOSE 141* 145* 140*  BUN 56* 55* 50*  CREATININE 1.83* 1.58* 1.59*  CALCIUM 9.3 9.1 9.5    Liver Function Tests:  Recent Labs Lab 01/16/14 1615 01/19/14 0355 01/20/14 0520  AST 41* 46*  --   ALT 41* 59*  --   ALKPHOS 142* 127*  --   BILITOT 0.6 0.4  --   PROT 7.8 6.4  --   ALBUMIN 3.2* 2.9* 3.0*    CBC:  Recent Labs Lab 01/18/14 0805 01/19/14 0355 01/20/14 0520  WBC 4.5 4.9 5.3  HGB 8.6* 8.0* 10.9*  HCT 25.2* 24.3* 33.1*  MCV 95.8 97.6 95.7  PLT 195 177 176    Cardiac Enzymes:  Recent Labs Lab 01/16/14 2015 01/17/14 0117 01/17/14 0819  TROPONINI <0.30 <0.30 <0.30    BNP:  Recent Labs  01/16/14 1615  PROBNP  7214.0*    Coagulation:  Recent Labs Lab 01/19/14 0355  INR 1.08    ECG:   Medications:   Scheduled Medications: . aspirin EC  81 mg Oral Daily  . carvedilol  3.125 mg Oral BID WC  . enoxaparin (LOVENOX) injection  30 mg Subcutaneous Q24H  . furosemide  20 mg Oral Daily  . pantoprazole  20 mg Oral Daily  . sodium chloride  3 mL Intravenous Q12H  . valACYclovir  1,000 mg Oral Daily     Infusions:     PRN Medications:  sodium chloride, diphenoxylate-atropine, HYDROmorphone, ondansetron, polyethylene glycol, prochlorperazine, sodium chloride, sorbitol   01/17/14 Echo  Assessment/Plan    80 yo female hx of breast CA, acute systolic heart failure LVEF 20% following chemotherapy admitted with congestive heart failure.  1. Acute on chronic systolic heart failure - presumed NICM, though does not appear she has had a definitive ischemic evaluation - LVEF normal 06/2013, echo 11/2013 LVEF 20%. Repeat echo this admit 01/17/14 with  LVEF 20%, no WMAs, grade I diastolic dysfunction, moderate MR - initial diuresis led to hypotension and  worsening of renal function, however recently has been able to get some fluid off. Negative 530 mL yesterday, net negative 3 liters since admission. Cr has trended down and stabilized after initial AKI. Continue gentle diuresis.  - has not been on ACE/ARB due to renal function, bp has not tolerated further titration of beta blocker.  - keep K at 4 and Mg at 2 in setting of diuresis - continue to follow symptoms with ambulation, hopefully can get to a point where she can be discharged home comfortably over the weekend with close CHF clinic f/u. If not, would need to reassess inpatient eval at [facility].   2. Anemia - transfused yesterday by primary team  Carlyle Dolly, M.D., F.A.C.C.

## 2014-01-20 NOTE — Progress Notes (Signed)
Jacqueline Rivas looks a little better. She is somewhat tired. She no problems with the 2 units of blood. She has a Port-A-Cath in now. This clearly will help her. She still has some shortness of breath. She still is almost 3 L negative. Troponin is to be a little more active. Hopefully she'll get out of bed today.  We really need to work on her nutrition. This, I think, is going to be an issue. She really does not have much in the way of reserves.  Her hemoglobin is now 10.9. This, will definitely help with the congestive heart failure.  For creatinine is stable at 1.59. Her albumin is 3. Her potassium is 3.4. This may have to be increased.  On physical, her blood pressure is 114/78. Pulse is 76. She is afebrile. Her lungs do have some crackles and wheezes bilaterally. Cardiac exam regular rate and rhythm. She has a 1/6 systolic murmur. Abdomen is soft. She has good bowel sounds. There is no palpable liver or spleen tip. Extremities shows some slight improvement in the edema.  Her chest x-ray looks better. She is improved aeration.  Again, I think nutritional improvement is going to be critical. I think that continued physical therapy also will be helpful.  I just don't see that she will be able to go home over the weekend. I just think that she needs some additional strengthening and in again, improved nutritional intake.  I do appreciate the outstanding care that she is getting from the hospitalist, cardiologist, and most importantly the staff on [place] 15:3-8

## 2014-01-20 NOTE — Progress Notes (Signed)
Note: This document was prepared with digital dictation and possible smart phrase technology. Any transcriptional errors that result from this process are unintentional.   Jacqueline Rivas IRJ:[id number] DOB: [birth year] DOA: 01/16/2014 PCP: No primary provider on file.  Brief narrative: 80 y/o ?, known h/o breast stage IIb (T2N1M0) infiltrating ductal carcinoma of the right breast. She had a mastectomy. She had. 3 positive lymph nodes + XRT/chemo, IgA Kappa MMyeloma on therapy [previously on herceptin/perjeta], systolic HF admitted 12/18/58 with acute decompensation HF with possibility non-compliance on medical therapy contributing-she states that she has had diarrhea on Revlimid for the past 2 months and due to this, wouldn't take all of her medicines regularily Admission weight 59kg Weight 12/21/13 at OP visit 55kg She developed some AKI and as such, because of decreased EF, Cardiology was consulted to see her Medical oncology also continued to follow the patient-she received 2 units of packed red blood cells 01/19/14 as well as Port-A-Cath   Past medical history-As per Problem list Chart reviewed as below-   Consultants:  Oncology  Cardiology  Procedures:  Echo 01/18/14  Abd USG-Indeterminate approximately 1.3 cm hyperechoic lesion within the left lobe of the liver - while possibly representative of a hepatic hemangioma, this lesion was not definitely identified on prior abdominal CT examinations and given history of breast cancer, metastatic disease is not excluded.   Antibiotics:  none   Subjective   SOB better Tolerating diet Still weak No nausea vomiting No blurred vision no double vision no falls Has not really been out of bed today and    Objective    Interim History:   Telemetry: sinus   Objective: Filed Vitals:   01/19/14 1915 01/19/14 1955 01/19/14 2307 01/20/14 0637  BP: 100/63 105/67 102/64 114/78  Pulse: 73 76 78 76  Temp: 97.5 F (36.4 C) 97.5 F  (36.4 C) 97.6 F (36.4 C) 97.4 F (36.3 C)  TempSrc: Oral Oral Oral Oral  Resp: 16 16 16 16   Height:      Weight:    58.877 kg (129 lb 12.8 oz)  SpO2: 100% 98% 95% 95%    Intake/Output Summary (Last 24 hours) at 01/20/14 1345 Last data filed at 01/20/14 0900  Gross per 24 hour  Intake   1188 ml  Output   1525 ml  Net   -337 ml    Exam:  General: eomi, ncat Cardiovascular:  s1 s2 no m/r/g Respiratory: clear, no added sound Abdomen:  Soft, nt/nd Skin: grade 1 lower extremity edema Neuro intact  Data Reviewed: Basic Metabolic Panel:  Recent Labs Lab 01/16/14 1615  01/17/14 0136 01/18/14 0345 01/19/14 0355 01/20/14 0520  NA 130*  --  129* 129* 128* 134*  K 5.5*  < > 5.0 4.7 4.1 3.4*  CL 93*  --  92* 91* 90* 93*  CO2 19  --  21 24 23 25   GLUCOSE 138*  --  133* 141* 145* 140*  BUN 32*  --  37* 56* 55* 50*  CREATININE 1.27*  --  1.41* 1.83* 1.58* 1.59*  CALCIUM 10.6*  --  9.9 9.3 9.1 9.5  PHOS  --   --   --   --   --  3.3  < > = values in this interval not displayed. Liver Function Tests:  Recent Labs Lab 01/16/14 1615 01/19/14 0355 01/20/14 0520  AST 41* 46*  --   ALT 41* 59*  --   ALKPHOS 142* 127*  --   BILITOT 0.6  0.4  --   PROT 7.8 6.4  --   ALBUMIN 3.2* 2.9* 3.0*   No results found for this basename: LIPASE, AMYLASE,  in the last 168 hours No results found for this basename: AMMONIA,  in the last 168 hours CBC:  Recent Labs Lab 01/16/14 1615 01/18/14 0805 01/19/14 0355 01/20/14 0520  WBC 5.9 4.5 4.9 5.3  NEUTROABS 4.7  --   --   --   HGB 9.0* 8.6* 8.0* 10.9*  HCT 27.4* 25.2* 24.3* 33.1*  MCV 97.2 95.8 97.6 95.7  PLT 228 195 177 176   Cardiac Enzymes:  Recent Labs Lab 01/16/14 2015 01/17/14 0117 01/17/14 0819  TROPONINI <0.30 <0.30 <0.30   BNP: No components found with this basename: POCBNP,  CBG: No results found for this basename: GLUCAP,  in the last 168 hours  No results found for this or any previous visit (from the past  240 hour(s)).   Studies:              All Imaging reviewed and is as per above notation   Scheduled Meds: . aspirin EC  325 mg Oral Daily  . carvedilol  3.125 mg Oral BID WC  . enoxaparin (LOVENOX) injection  30 mg Subcutaneous Q24H  . furosemide  20 mg Oral Daily  . pantoprazole  20 mg Oral Daily  . sodium chloride  3 mL Intravenous Q12H  . valACYclovir  1,000 mg Oral Daily   Continuous Infusions:    Assessment/Plan: 1. Acute respiratory failure 2/2 to decompensated systolic chf-resolved.  Ambulate in halls after transfusion, ambulatory sats as no prior O2 requirement 2. Acute decompensated chf-Ef remains 82% systolic-potentially from Herceptin.    Diurese lasix 20 po daily-strict i/o minus 2.6 liters  daily weights are inaccurate Continue Coreg 3.125 bid-appreciate cardiology input-hypotension/AKI precludes aggressive diuresis.  Will attempt close OP f/o with CHF Md in 2-3 days 3. AKI-2/2 to IV lasix.  Held overnight but restarted by cardiology at lower dose 20 mg daily 4. Myeloma + Breast Ca-Per oncology. 5. Hyperkalemia-potentially 2/2 to ACE + aldactone which have been held-resolved Hypervolemic hyponatremia, 2./2 to CHF ? CO ??? ? EAV-diurese.  Increase solute in diet when able to eat.  Add salt tabs, liberalize diet, fluid restrict.  Now resolving? Secondary to blood transfusion,? Tolerating her diet, sodium 134 6. Anemia of maliganancy-transfused blood 2 units 01/19/14-aranesp ordered x 1 01/18/14 7. Liver Mass-Hamangioma vs Metastases-defer to oncology 8. Hypercalcemia-discontinue caclium carbonate am-Corrected Ca no 9.9 and probably unrelated to malignancy or other causes  Code Status: Full Family Communication: none at bedside today Disposition Plan:  Likely will need skilled level of care  Verneita Griffes, MD  [facility] Pager [phone] 01/20/2014, 1:45 PM    LOS: 4 days

## 2014-01-21 DIAGNOSIS — I509 Heart failure, unspecified: Secondary | ICD-10-CM | POA: Diagnosis not present

## 2014-01-21 DIAGNOSIS — I5043 Acute on chronic combined systolic (congestive) and diastolic (congestive) heart failure: Principal | ICD-10-CM

## 2014-01-21 LAB — COMPREHENSIVE METABOLIC PANEL
ALT: 37 U/L — ABNORMAL HIGH (ref 0–35)
AST: 28 U/L (ref 0–37)
Albumin: 2.8 g/dL — ABNORMAL LOW (ref 3.5–5.2)
Alkaline Phosphatase: 124 U/L — ABNORMAL HIGH (ref 39–117)
BUN: 36 mg/dL — AB (ref 6–23)
CALCIUM: 9.5 mg/dL (ref 8.4–10.5)
CO2: 28 mEq/L (ref 19–32)
CREATININE: 1.27 mg/dL — AB (ref 0.50–1.10)
Chloride: 97 mEq/L (ref 96–112)
GFR calc Af Amer: 45 mL/min — ABNORMAL LOW (ref >90)
GFR calc non Af Amer: 39 mL/min — ABNORMAL LOW (ref >90)
Glucose, Bld: 115 mg/dL — ABNORMAL HIGH (ref 70–99)
Potassium: 3.2 mEq/L — ABNORMAL LOW (ref 3.7–5.3)
Sodium: 136 mEq/L — ABNORMAL LOW (ref 137–147)
TOTAL PROTEIN: 6.5 g/dL (ref 6.0–8.3)
Total Bilirubin: 0.5 mg/dL (ref 0.3–1.2)

## 2014-01-21 LAB — CBC
HEMATOCRIT: 32.9 % — AB (ref 36.0–46.0)
HEMOGLOBIN: 10.8 g/dL — AB (ref 12.0–15.0)
MCH: 32 pg (ref 26.0–34.0)
MCHC: 32.8 g/dL (ref 30.0–36.0)
MCV: 97.3 fL (ref 78.0–100.0)
PLATELETS: 168 10*3/uL (ref 150–400)
RBC: 3.38 MIL/uL — AB (ref 3.87–5.11)
RDW: 18.3 % — ABNORMAL HIGH (ref 11.5–15.5)
WBC: 4.7 10*3/uL (ref 4.0–10.5)

## 2014-01-21 MED ORDER — POTASSIUM CHLORIDE CRYS ER 20 MEQ PO TBCR
40.0000 meq | EXTENDED_RELEASE_TABLET | Freq: Every day | ORAL | Status: DC
  Administered 2014-01-21 – 2014-01-22 (×2): 40 meq via ORAL
  Filled 2014-01-21 (×2): qty 2

## 2014-01-21 MED ORDER — ENSURE COMPLETE PO LIQD
237.0000 mL | Freq: Two times a day (BID) | ORAL | Status: DC
  Administered 2014-01-21 – 2014-01-22 (×3): 237 mL via ORAL

## 2014-01-21 NOTE — Progress Notes (Signed)
Note: This document was prepared with digital dictation and possible smart phrase technology. Any transcriptional errors that result from this process are unintentional.   Jacqueline Rivas DDU:[id number] DOB: 09-24-1932 DOA: 01/16/2014 PCP: No primary provider on file.  Brief narrative: 80 y/o ?, known h/o breast stage IIb (T2N1M0) infiltrating ductal carcinoma of the right breast. She had a mastectomy. She had. 3 positive lymph nodes + XRT/chemo, IgA Kappa MMyeloma on therapy [previously on herceptin/perjeta], systolic HF admitted 2/37/62 with acute decompensation HF with possibility non-compliance on medical therapy contributing-she states that she has had diarrhea on Revlimid for the past 2 months and due to this, wouldn't take all of her medicines regularily Admission weight 59kg Weight 12/21/13 at OP visit 55kg She developed some AKI and as such, because of decreased EF, Cardiology was consulted to see her Medical oncology also continued to follow the patient-she received 2 units of packed red blood cells 01/19/14 as well as Port-A-Cath SHe has continued to imporve with a decrease in her LE swelling   Past medical history-As per Problem list Chart reviewed as below-   Consultants:  Oncology  Cardiology  Procedures:  Echo 01/18/14  Abd USG-Indeterminate approximately 1.3 cm hyperechoic lesion within the left lobe of the liver - while possibly representative of a hepatic hemangioma, this lesion was not definitely identified on prior abdominal CT examinations and given history of breast cancer, metastatic disease is not excluded.   Antibiotics:  none   Subjective   SOB better-doesn't bneed oxygen Tolerating diet Ambulating Eating well No CP NO stool yet but doesn't want laxative     Objective    Interim History:   Telemetry: sinus   Objective: Filed Vitals:   01/20/14 2115 01/21/14 0515 01/21/14 1448 01/21/14 1516  BP: 111/63 111/61 100/76   Pulse: 74 83 71     Temp: 97.6 F (36.4 C) 97.6 F (36.4 C) 97.9 F (36.6 C)   TempSrc: Oral Oral Oral   Resp: 16 16 18    Height:      Weight:  57.9 kg (127 lb 10.3 oz)    SpO2: 98% 99% 93% 95%    Intake/Output Summary (Last 24 hours) at 01/21/14 1536 Last data filed at 01/21/14 1400  Gross per 24 hour  Intake    720 ml  Output    400 ml  Net    320 ml    Exam:  General: eomi, ncat Cardiovascular:  s1 s2 no m/r/g Respiratory: clear, no added sound Abdomen:  Soft, nt/nd Skin: grade 1 lower extremity edema Neuro intact  Data Reviewed: Basic Metabolic Panel:  Recent Labs Lab 01/17/14 0136 01/18/14 0345 01/19/14 0355 01/20/14 0520 01/21/14 0500  NA 129* 129* 128* 134* 136*  K 5.0 4.7 4.1 3.4* 3.2*  CL 92* 91* 90* 93* 97  CO2 21 24 23 25 28   GLUCOSE 133* 141* 145* 140* 115*  BUN 37* 56* 55* 50* 36*  CREATININE 1.41* 1.83* 1.58* 1.59* 1.27*  CALCIUM 9.9 9.3 9.1 9.5 9.5  PHOS  --   --   --  3.3  --    Liver Function Tests:  Recent Labs Lab 01/16/14 1615 01/19/14 0355 01/20/14 0520 01/21/14 0500  AST 41* 46*  --  28  ALT 41* 59*  --  37*  ALKPHOS 142* 127*  --  124*  BILITOT 0.6 0.4  --  0.5  PROT 7.8 6.4  --  6.5  ALBUMIN 3.2* 2.9* 3.0* 2.8*   No results found for  this basename: LIPASE, AMYLASE,  in the last 168 hours No results found for this basename: AMMONIA,  in the last 168 hours CBC:  Recent Labs Lab 01/16/14 1615 01/18/14 0805 01/19/14 0355 01/20/14 0520 01/21/14 0500  WBC 5.9 4.5 4.9 5.3 4.7  NEUTROABS 4.7  --   --   --   --   HGB 9.0* 8.6* 8.0* 10.9* 10.8*  HCT 27.4* 25.2* 24.3* 33.1* 32.9*  MCV 97.2 95.8 97.6 95.7 97.3  PLT 228 195 177 176 168   Cardiac Enzymes:  Recent Labs Lab 01/16/14 2015 01/17/14 0117 01/17/14 0819  TROPONINI <0.30 <0.30 <0.30   BNP: No components found with this basename: POCBNP,  CBG: No results found for this basename: GLUCAP,  in the last 168 hours  No results found for this or any previous visit (from the past  240 hour(s)).   Studies:              All Imaging reviewed and is as per above notation   Scheduled Meds: . aspirin EC  325 mg Oral Daily  . carvedilol  3.125 mg Oral BID WC  . enoxaparin (LOVENOX) injection  30 mg Subcutaneous Q24H  . feeding supplement (ENSURE COMPLETE)  237 mL Oral BID BM  . furosemide  20 mg Oral Daily  . pantoprazole  20 mg Oral Daily  . sodium chloride  10-40 mL Intracatheter Q12H  . sodium chloride  3 mL Intravenous Q12H  . valACYclovir  1,000 mg Oral Daily   Continuous Infusions:    Assessment/Plan:  1. Acute respiratory failure 2/2 to decompensated systolic chf-resolved.  Ambulate in halls after transfusion, non-O2 dependant 2. Acute decompensated chf-Ef remains 02% systolic-potentially from Herceptin.    Diurese lasix 20 po daily-strict i/o minus 2.1 liters daily weights are inaccurate Continue Coreg 3.125 bid-appreciate cardiology input-hypotension/AKI precludes aggressive diuresis.  Will attempt close OP f/o with CHF Md in 2-3 days 3. AKI-2/2 to IV lasix.  Held overnight but restarted by cardiology at lower dose 20 mg daily-slightly oliguric-Would probably Bladder scan her if no further output as could be obstipated  4. Myeloma + Breast Ca-Per oncology. 5. Hyperkalemia-potentially 2/2 to ACE + aldactone which have been held-resolved Hypervolemic hyponatremia, 2./2 to CHF ? CO ??? ? EAV-diurese.  Increase solute in diet when able to eat.  iberalize diet Now resolving? Secondary to blood transfusion,? Tolerating her diet, sodium now ~ 136 6. Anemia of maliganancy-transfused blood 2 units 01/19/14-aranesp ordered x 1 01/18/14 7. Liver Mass-Hamangioma vs Metastases-defer to oncology 8. Hypercalcemia-discontinue caclium carbonate am-Corrected Ca no 9.9 and probably unrelated to malignancy or other causes  Code Status: Full Family Communication: none at bedside today Disposition Plan:  Likely will need skilled level of care-await word from PT am  Verneita Griffes,  MD  [facility] Pager [phone] 01/21/2014, 3:36 PM    LOS: 5 days

## 2014-01-21 NOTE — Progress Notes (Signed)
Patient ID: Jacqueline Rivas, female   DOB: [birth year], 80 y.o.   MRN: [id number]    Subjective:    SOB improving but not resolved  Objective:   Temp:  [97.6 F (36.4 C)] 97.6 F (36.4 C) (04/05 0515) Pulse Rate:  [73-83] 83 (04/05 0515) Resp:  [16] 16 (04/05 0515) BP: (99-111)/(56-63) 111/61 mmHg (04/05 0515) SpO2:  [98 %-99 %] 99 % (04/05 0515) Weight:  [127 lb 10.3 oz (57.9 kg)] 127 lb 10.3 oz (57.9 kg) (04/05 0515) Last BM Date: 01/19/14  Filed Weights   01/19/14 0410 01/20/14 0637 01/21/14 0515  Weight: 128 lb 12.8 oz (58.423 kg) 129 lb 12.8 oz (58.877 kg) 127 lb 10.3 oz (57.9 kg)    Intake/Output Summary (Last 24 hours) at 01/21/14 0745 Last data filed at 01/20/14 1900  Gross per 24 hour  Intake    840 ml  Output    450 ml  Net    390 ml    Exam:  General: NAD  Resp: CTAB  Cardiac: RRR, no m/r/g, + hepatojugular reflux  GI: soft, NT, ND  MSK: extremities are warm, no edema  Neuro: no focal deficits   Lab Results:  Basic Metabolic Panel:  Recent Labs Lab 01/19/14 0355 01/20/14 0520 01/21/14 0500  NA 128* 134* 136*  K 4.1 3.4* 3.2*  CL 90* 93* 97  CO2 23 25 28   GLUCOSE 145* 140* 115*  BUN 55* 50* 36*  CREATININE 1.58* 1.59* 1.27*  CALCIUM 9.1 9.5 9.5    Liver Function Tests:  Recent Labs Lab 01/16/14 1615 01/19/14 0355 01/20/14 0520 01/21/14 0500  AST 41* 46*  --  28  ALT 41* 59*  --  37*  ALKPHOS 142* 127*  --  124*  BILITOT 0.6 0.4  --  0.5  PROT 7.8 6.4  --  6.5  ALBUMIN 3.2* 2.9* 3.0* 2.8*    CBC:  Recent Labs Lab 01/19/14 0355 01/20/14 0520 01/21/14 0500  WBC 4.9 5.3 4.7  HGB 8.0* 10.9* 10.8*  HCT 24.3* 33.1* 32.9*  MCV 97.6 95.7 97.3  PLT 177 176 168    Cardiac Enzymes:  Recent Labs Lab 01/16/14 2015 01/17/14 0117 01/17/14 0819  TROPONINI <0.30 <0.30 <0.30    BNP:  Recent Labs  01/16/14 1615  PROBNP 7214.0*    Coagulation:  Recent Labs Lab 01/19/14 0355  INR 1.08    ECG:   Medications:    Scheduled Medications: . aspirin EC  325 mg Oral Daily  . carvedilol  3.125 mg Oral BID WC  . enoxaparin (LOVENOX) injection  30 mg Subcutaneous Q24H  . furosemide  20 mg Oral Daily  . pantoprazole  20 mg Oral Daily  . sodium chloride  10-40 mL Intracatheter Q12H  . sodium chloride  3 mL Intravenous Q12H  . valACYclovir  1,000 mg Oral Daily     Infusions:     PRN Medications:  sodium chloride, diphenoxylate-atropine, HYDROmorphone, ondansetron, polyethylene glycol, prochlorperazine, sodium chloride, sodium chloride, sorbitol     Assessment/Plan   1. Acute on chronic systolic heart failure  - presumed NICM, though does not appear she has had a definitive ischemic evaluation. Potentially secondary to chemotherapy - LVEF normal 06/2013, echo 11/2013 LVEF 20%. Repeat echo this admit 01/17/14 with  LVEF 20%, no WMAs, grade I diastolic dysfunction, moderate MR  - initial diuresis led to hypotension and worsening of renal function, however recently has been able to get some fluid off.  +390 mL yesterday, net negative 2.5  liters since admission. Cr continues to trend down after initial AKI. Currently on lasix 20mg  qday. - has not been on ACE/ARB due to renal function, bp has not tolerated further titration of beta blocker.  -further medication titration can be performed overtime as outpatient. Decision on ischemic evaluation can also be made at follow up, no indication for inpatient eval.   - still with some evidence of volume overload with continued symptoms. She was net positive yesterday. Will increase oral lasix to 20mg  bid. Follow symptoms with ambulation today.   2. Anemia  - transfused with appropriate response, stable H&H.  3. Hypokalemia - replacement per primary team  4. Constipation - reports no BM for several days - management per primary team    Carlyle Dolly, M.D., F.A.C.C.

## 2014-01-21 NOTE — Progress Notes (Signed)
INITIAL NUTRITION ASSESSMENT  DOCUMENTATION CODES Per approved criteria  -Severe malnutrition in the context of chronic illness  Pt meets criteria for severe MALNUTRITION in the context of chronic illness as evidenced by severe fat and muscle wasting and 8.6% wt loss in 2 months.  INTERVENTION: - Ensure Complete po BID, each supplement provides 350 kcal and 13 grams of protein. - Continue to encourage meals and snacks.  NUTRITION DIAGNOSIS: Inadequate oral intake related to chronic illness/CHF as evidenced by reported intake less than estimated needs.   Goal: Pt to meet >/= 90% of their estimated nutrition needs   Monitor:  I/O's, po intake, acceptance of supplements, labs  Reason for Assessment: Consult for Poor PO  80 y.o. female  Admitting Dx: Acute on chronic systolic and diastolic heart failure, NYHA class 4  ASSESSMENT: 80 y.o. year-old female with history of breast cancer status post modified mastectomy, XRT, and chemotherapy, multiple myeloma on chemotherapy, hypertension, hyperlipidemia, systolic heart failure with ejection fraction of 20% who presents with shortness of breath.  She has had increasing lower extremity edema and abdominal swelling. She states she has lost weight secondary to her chemotherapy and chronic diarrhea. She states that she may have eaten more salt than usual because she cooks bacon, ham, and roast beef this week.  Pt reported that her appetite has improved. She said that she ate a big breakfast this morning. Pt reports that she has lost about 12 lbs over the past 2 months. She also reports severe constipation. Pt uses Unjury protein powder at home, but has never tried Ensure Complete.   Nutrition Focused Physical Exam:  Subcutaneous Fat:  Orbital Region: severe wasting Upper Arm Region: severe wasting Thoracic and Lumbar Region: n/a  Muscle:  Temple Region: severe wasting Clavicle Bone Region: severe wasting Clavicle and Acromion Bone  Region: severe wasting Scapular Bone Region: n/a Dorsal Hand: severe wasting Patellar Region: severe wasting Anterior Thigh Region: severe wasting Posterior Calf Region: moderate wasting  Height: Ht Readings from Last 1 Encounters:  01/16/14 5' 3"  (1.6 m)    Weight: Wt Readings from Last 1 Encounters:  01/21/14 127 lb 10.3 oz (57.9 kg)    Ideal Body Weight: 52.4 kg  % Ideal Body Weight: 110%  Wt Readings from Last 10 Encounters:  01/21/14 127 lb 10.3 oz (57.9 kg)  12/21/13 123 lb (55.792 kg)  11/30/13 130 lb (58.968 kg)  11/13/13 129 lb (58.514 kg)  10/20/13 129 lb (58.514 kg)  09/21/13 127 lb (57.607 kg)  08/31/13 136 lb (61.689 kg)  08/03/13 135 lb (61.236 kg)  07/12/13 137 lb (62.143 kg)  06/15/13 137 lb (62.143 kg)    Usual Body Weight: 139 lbs 2 months ago  % Usual Body Weight: 91%  BMI:  Body mass index is 22.62 kg/(m^2).  Estimated Nutritional Needs: Kcal: 1450-1600 Protein: 65-75 g Fluid: 1.5-1.6 L/day  Skin: intact  Diet Order: General  EDUCATION NEEDS: -Education needs addressed   Intake/Output Summary (Last 24 hours) at 01/21/14 0959 Last data filed at 01/21/14 0900  Gross per 24 hour  Intake    720 ml  Output    425 ml  Net    295 ml    Last BM: none recorded   Labs:   Recent Labs Lab 01/19/14 0355 01/20/14 0520 01/21/14 0500  NA 128* 134* 136*  K 4.1 3.4* 3.2*  CL 90* 93* 97  CO2 23 25 28   BUN 55* 50* 36*  CREATININE 1.58* 1.59* 1.27*  CALCIUM 9.1  9.5 9.5  PHOS  --  3.3  --   GLUCOSE 145* 140* 115*    CBG (last 3)  No results found for this basename: GLUCAP,  in the last 72 hours  Scheduled Meds: . aspirin EC  325 mg Oral Daily  . carvedilol  3.125 mg Oral BID WC  . enoxaparin (LOVENOX) injection  30 mg Subcutaneous Q24H  . furosemide  20 mg Oral Daily  . pantoprazole  20 mg Oral Daily  . sodium chloride  10-40 mL Intracatheter Q12H  . sodium chloride  3 mL Intravenous Q12H  . valACYclovir  1,000 mg Oral Daily     Continuous Infusions:   Past Medical History  Diagnosis Date  . Diverticulosis   . Hiatal hernia   . Hyperlipidemia   . Hypertension   . Breast mass   . Multiple myeloma   . Breast cancer   . Arthritis   . S/P radiation therapy 12/15/12 - 12/21/12    T6 - L1 / 18.75 Gy / 5 Fractions  . Bilateral atelectasis   . Systolic heart failure     EF 20% 11/2013  . Protein-calorie malnutrition, severe 01/20/2014    Past Surgical History  Procedure Laterality Date  . Tonsillectomy    . Modified mastectomy Right 01/25/2013    Procedure: MODIFIED MASTECTOMY;  Surgeon: Edward Jolly, MD;  Location: [place];  Service: General;  Laterality: Right;    Terrace Arabia RD, LDN

## 2014-01-21 NOTE — Progress Notes (Signed)
Jacqueline Rivas   DOB:11/27/1931   AV#:[id number]   NWG#:[id number]  Subjective: just a "hello" visit--no new complaints   Objective: elderly White woman who appears comfortable Filed Vitals:   01/21/14 0515  BP: 111/61  Pulse: 83  Temp: 97.6 F (36.4 C)  Resp: 16    Body mass index is 22.62 kg/(m^2).  Intake/Output Summary (Last 24 hours) at 01/21/14 1007 Last data filed at 01/21/14 0900  Gross per 24 hour  Intake    720 ml  Output    425 ml  Net    295 ml    Full exam deferred  CBG (last 3)  No results found for this basename: GLUCAP,  in the last 72 hours   Labs:  Lab Results  Component Value Date   WBC 4.7 01/21/2014   HGB 10.8* 01/21/2014   HCT 32.9* 01/21/2014   MCV 97.3 01/21/2014   PLT 168 01/21/2014   NEUTROABS 4.7 01/16/2014    _0 @  Urine Studies No results found for this basename: UACOL, UAPR, USPG, UPH, UTP, UGL, UKET, UBIL, UHGB, UNIT, UROB, ULEU, UEPI, UWBC, URBC, UBAC, CAST, CRYS, UCOM, BILUA,  in the last 72 hours  Basic Metabolic Panel:  Recent Labs Lab 01/17/14 0136 01/18/14 0345 01/19/14 0355 01/20/14 0520 01/21/14 0500  NA 129* 129* 128* 134* 136*  K 5.0 4.7 4.1 3.4* 3.2*  CL 92* 91* 90* 93* 97  CO2 _1 GLUCOSE 133* 141* 145* 140* 115*  BUN 37* 56* 55* 50* 36*  CREATININE 1.41* 1.83* 1.58* 1.59* 1.27*  CALCIUM 9.9 9.3 9.1 9.5 9.5  PHOS  --   --   --  3.3  --    GFR Estimated Creatinine Clearance: 29.7 ml/min (by C-G formula based on Cr of 1.27). Liver Function Tests:  Recent Labs Lab 01/16/14 1615 01/19/14 0355 01/20/14 0520 01/21/14 0500  AST 41* 46*  --  28  ALT 41* 59*  --  37*  ALKPHOS 142* 127*  --  124*  BILITOT 0.6 0.4  --  0.5  PROT 7.8 6.4  --  6.5  ALBUMIN 3.2* 2.9* 3.0* 2.8*   No results found for this basename: LIPASE, AMYLASE,  in the last 168 hours No results found for this basename: AMMONIA,  in the last 168 hours Coagulation profile  Recent Labs Lab 01/19/14 0355  INR 1.08     CBC:  Recent Labs Lab 01/16/14 1615 01/18/14 0805 01/19/14 0355 01/20/14 0520 01/21/14 0500  WBC 5.9 4.5 4.9 5.3 4.7  NEUTROABS 4.7  --   --   --   --   HGB 9.0* 8.6* 8.0* 10.9* 10.8*  HCT 27.4* 25.2* 24.3* 33.1* 32.9*  MCV 97.2 95.8 97.6 95.7 97.3  PLT 228 195 177 176 168   Cardiac Enzymes:  Recent Labs Lab 01/16/14 2015 01/17/14 0117 01/17/14 0819  TROPONINI <0.30 <0.30 <0.30   BNP: No components found with this basename: POCBNP,  CBG: No results found for this basename: GLUCAP,  in the last 168 hours D-Dimer No results found for this basename: DDIMER,  in the last 72 hours Hgb A1c No results found for this basename: HGBA1C,  in the last 72 hours Lipid Profile No results found for this basename: CHOL, HDL, LDLCALC, TRIG, CHOLHDL, LDLDIRECT,  in the last 72 hours Thyroid function studies No results found for this basename: TSH, T4TOTAL, FREET3, T3FREE, THYROIDAB,  in the last 72 hours Anemia work up No results found for this basename: VITAMINB12,  FOLATE, FERRITIN, TIBC, IRON, RETICCTPCT,  in the last 72 hours Microbiology No results found for this or any previous visit (from the past 240 hour(s)).    Studies:  Ir Fluoro Guide Cv Line Left  01/19/2014   CLINICAL DATA:  Breast cancer.  Multiple myeloma.  EXAM: TUNNEL POWER PORT PLACEMENT WITH SUBCUTANEOUS POCKET UTILIZING ULTRASOUND & FLOUROSCOPY  FLUOROSCOPY TIME:  1.0 minutes  MEDICATIONS AND MEDICAL HISTORY: Versed two mg, Fentanyl 100 mcg.  As antibiotic prophylaxis, Ancef was ordered pre-procedure and administered intravenously within one hour of incision.  ANESTHESIA/SEDATION: Moderate sedation time: 30 minutes  CONTRAST:  None  PROCEDURE: After written informed consent was obtained, patient was placed in the supine position on angiographic table. The left neck and chest was prepped and draped in a sterile fashion. Lidocaine was utilized for local anesthesia. The left internal jugular vein was noted to be  patent initially with ultrasound. Under sonographic guidance, a micropuncture needle was inserted into the left IJ vein (Ultrasound and fluoroscopic image documentation was performed). The needle was removed over an 018 wire which was exchanged for a Amplatz. This was advanced into the IVC. An 8-French dilator was advanced over the Amplatz.  A small incision was made in the left upper chest over the anterior left second rib. Utilizing blunt dissection, a subcutaneous pocket was created in the caudal direction. The pocket was irrigated with a copious amount of sterile normal saline. The port catheter was tunneled from the chest incision, and out the neck incision. The reservoir was inserted into the subcutaneous pocket and secured with two 3-0 Ethilon stitches. A peel-away sheath was advanced over the Amplatz wire. The port catheter was cut to measure length and inserted through the peel-away sheath. The peel-away sheath was removed. The chest incision was closed with 3-0 Vicryl interrupted stitches for the subcutaneous tissue and a running of 4-0 Vicryl subcuticular stitch for the skin. The neck incision was closed with a 4-0 Vicryl subcuticular stitch. Derma-bond was applied to both surgical incisions. The port reservoir was flushed and instilled with heparinized saline. No complications.  FINDINGS: A left IJ vein Port-A-Cath is in place with its tip at the cavoatrial junction.  IMPRESSION: Successful 8 French left internal jugular vein power port placement with its tip at the SVC/RA junction.   Electronically Signed   By: Maryclare Bean M.D.   On: 01/19/2014 16:08   Ir US Guide Vasc Access Left  01/19/2014   CLINICAL DATA:  Breast cancer.  Multiple myeloma.  EXAM: TUNNEL POWER PORT PLACEMENT WITH SUBCUTANEOUS POCKET UTILIZING ULTRASOUND & FLOUROSCOPY  FLUOROSCOPY TIME:  1.0 minutes  MEDICATIONS AND MEDICAL HISTORY: Versed two mg, Fentanyl 100 mcg.  As antibiotic prophylaxis, Ancef was ordered pre-procedure and  administered intravenously within one hour of incision.  ANESTHESIA/SEDATION: Moderate sedation time: 30 minutes  CONTRAST:  None  PROCEDURE: After written informed consent was obtained, patient was placed in the supine position on angiographic table. The left neck and chest was prepped and draped in a sterile fashion. Lidocaine was utilized for local anesthesia. The left internal jugular vein was noted to be patent initially with ultrasound. Under sonographic guidance, a micropuncture needle was inserted into the left IJ vein (Ultrasound and fluoroscopic image documentation was performed). The needle was removed over an 018 wire which was exchanged for a Amplatz. This was advanced into the IVC. An 8-French dilator was advanced over the Amplatz.  A small incision was made in the left upper chest over the anterior  left second rib. Utilizing blunt dissection, a subcutaneous pocket was created in the caudal direction. The pocket was irrigated with a copious amount of sterile normal saline. The port catheter was tunneled from the chest incision, and out the neck incision. The reservoir was inserted into the subcutaneous pocket and secured with two 3-0 Ethilon stitches. A peel-away sheath was advanced over the Amplatz wire. The port catheter was cut to measure length and inserted through the peel-away sheath. The peel-away sheath was removed. The chest incision was closed with 3-0 Vicryl interrupted stitches for the subcutaneous tissue and a running of 4-0 Vicryl subcuticular stitch for the skin. The neck incision was closed with a 4-0 Vicryl subcuticular stitch. Derma-bond was applied to both surgical incisions. The port reservoir was flushed and instilled with heparinized saline. No complications.  FINDINGS: A left IJ vein Port-A-Cath is in place with its tip at the cavoatrial junction.  IMPRESSION: Successful 8 French left internal jugular vein power port placement with its tip at the SVC/RA junction.   Electronically  Signed   By: Maryclare Bean M.D.   On: 01/19/2014 16:08    Courtesy note:  80 y.o. [place] woman with a history of multiple myeloma, IgA kappa; T2 N1/ stage IIB right sided breast cancer, HER-2 positive; and CHF, possibly related to treatment with trastuzumab  Plan: no changes made; appreciate excellent care from hospitalist team; Dr Marin Olp will follow tomorrow   Chauncey Cruel, MD 01/21/2014  10:07 AM

## 2014-01-22 DIAGNOSIS — Z825 Family history of asthma and other chronic lower respiratory diseases: Secondary | ICD-10-CM | POA: Diagnosis not present

## 2014-01-22 DIAGNOSIS — R0609 Other forms of dyspnea: Secondary | ICD-10-CM | POA: Diagnosis not present

## 2014-01-22 DIAGNOSIS — Z9221 Personal history of antineoplastic chemotherapy: Secondary | ICD-10-CM | POA: Diagnosis not present

## 2014-01-22 DIAGNOSIS — R0602 Shortness of breath: Secondary | ICD-10-CM | POA: Diagnosis not present

## 2014-01-22 DIAGNOSIS — I509 Heart failure, unspecified: Secondary | ICD-10-CM | POA: Diagnosis present

## 2014-01-22 DIAGNOSIS — R4181 Age-related cognitive decline: Secondary | ICD-10-CM | POA: Diagnosis present

## 2014-01-22 DIAGNOSIS — I5023 Acute on chronic systolic (congestive) heart failure: Secondary | ICD-10-CM | POA: Diagnosis not present

## 2014-01-22 DIAGNOSIS — R52 Pain, unspecified: Secondary | ICD-10-CM | POA: Diagnosis not present

## 2014-01-22 DIAGNOSIS — K5730 Diverticulosis of large intestine without perforation or abscess without bleeding: Secondary | ICD-10-CM | POA: Diagnosis present

## 2014-01-22 DIAGNOSIS — R609 Edema, unspecified: Secondary | ICD-10-CM | POA: Diagnosis not present

## 2014-01-22 DIAGNOSIS — Z853 Personal history of malignant neoplasm of breast: Secondary | ICD-10-CM | POA: Diagnosis not present

## 2014-01-22 DIAGNOSIS — Z5189 Encounter for other specified aftercare: Secondary | ICD-10-CM | POA: Diagnosis not present

## 2014-01-22 DIAGNOSIS — Z923 Personal history of irradiation: Secondary | ICD-10-CM | POA: Diagnosis not present

## 2014-01-22 DIAGNOSIS — I428 Other cardiomyopathies: Secondary | ICD-10-CM | POA: Diagnosis not present

## 2014-01-22 DIAGNOSIS — I5020 Unspecified systolic (congestive) heart failure: Secondary | ICD-10-CM | POA: Diagnosis not present

## 2014-01-22 DIAGNOSIS — Z602 Problems related to living alone: Secondary | ICD-10-CM | POA: Diagnosis not present

## 2014-01-22 DIAGNOSIS — I129 Hypertensive chronic kidney disease with stage 1 through stage 4 chronic kidney disease, or unspecified chronic kidney disease: Secondary | ICD-10-CM | POA: Diagnosis present

## 2014-01-22 DIAGNOSIS — J9600 Acute respiratory failure, unspecified whether with hypoxia or hypercapnia: Secondary | ICD-10-CM | POA: Diagnosis not present

## 2014-01-22 DIAGNOSIS — C50919 Malignant neoplasm of unspecified site of unspecified female breast: Secondary | ICD-10-CM | POA: Diagnosis not present

## 2014-01-22 DIAGNOSIS — Z66 Do not resuscitate: Secondary | ICD-10-CM | POA: Diagnosis present

## 2014-01-22 DIAGNOSIS — E875 Hyperkalemia: Secondary | ICD-10-CM | POA: Diagnosis not present

## 2014-01-22 DIAGNOSIS — E785 Hyperlipidemia, unspecified: Secondary | ICD-10-CM | POA: Diagnosis present

## 2014-01-22 DIAGNOSIS — Z79899 Other long term (current) drug therapy: Secondary | ICD-10-CM | POA: Diagnosis not present

## 2014-01-22 DIAGNOSIS — C9000 Multiple myeloma not having achieved remission: Secondary | ICD-10-CM | POA: Diagnosis not present

## 2014-01-22 DIAGNOSIS — Z807 Family history of other malignant neoplasms of lymphoid, hematopoietic and related tissues: Secondary | ICD-10-CM | POA: Diagnosis not present

## 2014-01-22 DIAGNOSIS — I119 Hypertensive heart disease without heart failure: Secondary | ICD-10-CM | POA: Diagnosis not present

## 2014-01-22 DIAGNOSIS — Z8249 Family history of ischemic heart disease and other diseases of the circulatory system: Secondary | ICD-10-CM | POA: Diagnosis not present

## 2014-01-22 DIAGNOSIS — I079 Rheumatic tricuspid valve disease, unspecified: Secondary | ICD-10-CM | POA: Diagnosis present

## 2014-01-22 DIAGNOSIS — N183 Chronic kidney disease, stage 3 unspecified: Secondary | ICD-10-CM | POA: Diagnosis not present

## 2014-01-22 DIAGNOSIS — N179 Acute kidney failure, unspecified: Secondary | ICD-10-CM | POA: Diagnosis not present

## 2014-01-22 DIAGNOSIS — E46 Unspecified protein-calorie malnutrition: Secondary | ICD-10-CM | POA: Diagnosis not present

## 2014-01-22 DIAGNOSIS — F411 Generalized anxiety disorder: Secondary | ICD-10-CM | POA: Diagnosis not present

## 2014-01-22 LAB — RENAL FUNCTION PANEL
Albumin: 2.8 g/dL — ABNORMAL LOW (ref 3.5–5.2)
BUN: 31 mg/dL — AB (ref 6–23)
CO2: 28 meq/L (ref 19–32)
Calcium: 9.5 mg/dL (ref 8.4–10.5)
Chloride: 99 mEq/L (ref 96–112)
Creatinine, Ser: 1.21 mg/dL — ABNORMAL HIGH (ref 0.50–1.10)
GFR calc Af Amer: 48 mL/min — ABNORMAL LOW (ref >90)
GFR calc non Af Amer: 41 mL/min — ABNORMAL LOW (ref >90)
GLUCOSE: 117 mg/dL — AB (ref 70–99)
POTASSIUM: 4.2 meq/L (ref 3.7–5.3)
Phosphorus: 2.3 mg/dL (ref 2.3–4.6)
SODIUM: 137 meq/L (ref 137–147)

## 2014-01-22 LAB — TYPE AND SCREEN
ABO/RH(D): O POS
ANTIBODY SCREEN: NEGATIVE
Unit division: 0
Unit division: 0

## 2014-01-22 LAB — CBC
HEMATOCRIT: 34.8 % — AB (ref 36.0–46.0)
Hemoglobin: 11 g/dL — ABNORMAL LOW (ref 12.0–15.0)
MCH: 31.9 pg (ref 26.0–34.0)
MCHC: 31.6 g/dL (ref 30.0–36.0)
MCV: 100.9 fL — ABNORMAL HIGH (ref 78.0–100.0)
Platelets: 188 10*3/uL (ref 150–400)
RBC: 3.45 MIL/uL — ABNORMAL LOW (ref 3.87–5.11)
RDW: 18.5 % — AB (ref 11.5–15.5)
WBC: 5.6 10*3/uL (ref 4.0–10.5)

## 2014-01-22 MED ORDER — FUROSEMIDE 20 MG PO TABS: 20.0000 mg | ORAL_TABLET | Freq: Every day | ORAL | Status: DC

## 2014-01-22 MED ORDER — HEPARIN SOD (PORK) LOCK FLUSH 100 UNIT/ML IV SOLN: 500.0000 [IU] | INTRAVENOUS | Status: DC | PRN

## 2014-01-22 MED ORDER — HYDROMORPHONE HCL 2 MG PO TABS: 2.0000 mg | ORAL_TABLET | Freq: Four times a day (QID) | ORAL | Status: DC | PRN

## 2014-01-22 NOTE — Progress Notes (Signed)
Patient is set to discharge to [facility] today. Patient aware. Discharge packet in [place], [place] aware. [facility] called for transport.   Clinical Social Work Department CLINICAL SOCIAL WORK PLACEMENT NOTE 01/22/2014  Patient:  Jacqueline Rivas, Jacqueline Rivas  Account Number:  [id number] Admit date:  01/16/2014  Clinical Social Worker:  Renold Genta  Date/time:  01/19/2014 10:55 AM  Clinical Social Work is seeking post-discharge placement for this patient at the following level of care:   [place]   (*CSW will update this form in Epic as items are completed)   01/19/2014  Patient/family provided with [place] Department of Clinical Social Work's list of facilities offering this level of care within the geographic area requested by the patient (or if unable, by the patient's family).  01/19/2014  Patient/family informed of their freedom to choose among providers that offer the needed level of care, that participate in Medicare, Medicaid or managed care program needed by the patient, have an available bed and are willing to accept the patient.  01/19/2014  Patient/family informed of [facility]' ownership interest in [facility], as well as of the fact that they are under no obligation to receive care at this facility.  PASARR submitted to EDS on 01/19/2014 PASARR number received from EDS on 01/19/2014  FL2 transmitted to all facilities in geographic area requested by pt/family on  01/19/2014 FL2 transmitted to all facilities within larger geographic area on   Patient informed that his/her managed care company has contracts with or will negotiate with  certain facilities, including the following:     Patient/family informed of bed offers received:  01/19/2014 Patient chooses bed at [place] Physician recommends and patient chooses bed at    Patient to be transferred to [place] on  01/22/2014 Patient to be  transferred to facility by [facility]  The following physician request were entered in Epic:   Additional Comments:   Raynaldo Opitz, [place] Worker cell #: [phone]

## 2014-01-22 NOTE — Discharge Summary (Signed)
Physician Discharge Summary  Jacqueline Rivas YWV:[id number] DOB: 09-12-1932 DOA: 01/16/2014  PCP: No primary provider on file.  Admit date: 01/16/2014 Discharge date: 01/22/2014  Time spent: 45 minutes  Recommendations for Outpatient Follow-up:  1. Needs BMET in 1 week 2. Close OP cardiac follow up as decreased EF-Follow with advanced HF team 3. Continue lasix 20 daily-monitor weights 4. Monitor UOP carfeully.  Might need ito increase lasix dose 5. Has liver mass that might warrant OP attention. 6. HCTZ d/c as was hyponatemic and had mild AKI 7. [facility]   Discharge Diagnoses:  Principal Problem:   Acute on chronic systolic and diastolic heart failure, NYHA class 4 Active Problems:   HYPONATREMIA   Serum calcium elevated   Shingles outbreak   Multiple myeloma, without mention of having achieved remission(203.00)   Breast cancer metastasized to axillary lymph node   Chronic kidney disease (CKD), stage III (moderate)   Hyperkalemia   Anemia   Acute on chronic systolic CHF (congestive heart failure)   Non-ischemic cardiomyopathy   Protein-calorie malnutrition, severe   Discharge Condition: fai  Diet recommendation: heart healthy, low salt. No fluid restriciton  Filed Weights   01/20/14 8546 01/21/14 0515 01/22/14 0409  Weight: 58.877 kg (129 lb 12.8 oz) 57.9 kg (127 lb 10.3 oz) 59.058 kg (130 lb 3.2 oz)    History of present illness:   Note: This document was prepared with digital dictation and possible smart phrase technology. Any transcriptional errors that result from this process are unintentional.  Jacqueline Rivas EVO:[id number] DOB: 03-26-1932 DOA: 01/16/2014  PCP: No primary provider on file.  Brief narrative:   80 y/o ?, known h/o breast stage IIb (T2N1M0) infiltrating ductal carcinoma of the right breast. She had a mastectomy. She had. 3 positive lymph nodes + XRT/chemo, IgA Kappa MMyeloma on therapy [previously on herceptin/perjeta], systolic HF admitted  2/99/37 with acute decompensation HF with possibility non-compliance on medical therapy contributing-she states that she has had diarrhea on Revlimid for the past 2 months and due to this, wouldn't take all of her medicines regularily  Admission weight 59kg  Weight 12/21/13 at OP visit 55kg  She developed some AKI and as such, because of decreased EF, Cardiology was consulted to see her  Medical oncology also continued to follow the patient-she received 2 units of packed red blood cells 01/19/14 as well as Port-A-Cath  She has continued to imporve with a decrease in her LE swelling but was still thgouth to beenfit from close follow up by both cardiology and might need Cath or further work-up as OP for decreased EF once renal insufficieny clears     Hospital Course:   1. Acute respiratory failure 2/2 to decompensated systolic chf-resolved. Ambulate in halls after transfusion, non-O2 dependant now and much better 2. Acute decompensated chf-Ef remains 16% systolic-potentially from Herceptin. Diurese lasix 20 po daily-strict i/o minus 1.3 liters daily weights are inaccurate.  Continue Coreg 3.125 bid-appreciate cardiology input-hypotension/AKI precludes aggressive diuresis. Will attempt close OP f/o with CHF Md in 2-3 days-this will need to be arranged 3. AKI-2/2 to IV lasix. Held overnight but restarted by cardiology at lower dose 20 mg daily-slightly oliguric but not taking in much orally.  monitor as OP 4. Myeloma + Breast Ca-Per oncology.  Close OP follow up 5. Hyperkalemia-potentially 2/2 to ACE + aldactone which have been held-resolved 6. Hypervolemic hyponatremia, 2./2 to CHF ? CO ??? ? EAV-diurese. Increase solute in diet when able to eat. iberalize diet Now resolving? Secondary to  blood transfusion,? Tolerating her diet, sodium now ~ 136 6. Anemia of maliganancy-transfused blood 2 units 01/19/14-aranesp ordered x 1 01/18/14 7. Liver Mass-Hemangioma vs Metastases-defer to oncology-d onot think this is  malignancy related and OP work-up deferred to Dr. Marin Olp on follow up 8. Hypercalcemia-discontinue caclium carbonate am-Corrected Ca no 9.9 and probably unrelated to malignancy or other causes  Consultants:  Oncology  Cardiology Procedures:  Echo 01/18/14  Abd USG-Indeterminate approximately 1.3 cm hyperechoic lesion within the left lobe of the liver - while possibly representative of a hepatic hemangioma, this lesion was not definitely identified on prior abdominal CT examinations and given history of breast cancer, metastatic disease is not excluded.  Antibiotics:  none  Discharge Exam: Filed Vitals:   01/22/14 0803  BP: 115/62  Pulse: 78  Temp:   Resp:     General: EOMI, NCAt Cardiovascular:  s1 s2 no m/r/g Respiratory: clear  Discharge Instructions You were cared for by a hospitalist during your hospital stay. If you have any questions about your discharge medications or the care you received while you were in the hospital after you are discharged, you can call the unit and asked to speak with the hospitalist on call if the hospitalist that took care of you is not available. Once you are discharged, your primary care physician will handle any further medical issues. Please note that NO REFILLS for any discharge medications will be authorized once you are discharged, as it is imperative that you return to your primary care physician (or establish a relationship with a primary care physician if you do not have one) for your aftercare needs so that they can reassess your need for medications and monitor your lab values.  Discharge Orders   Future Appointments Provider Department Dept Phone   01/23/2014 1:20 PM [facility] [place] [phone]   02/15/2014 9:00 AM [place] [phone]   02/15/2014 9:30 AM [name], MD [place] [phone]   02/15/2014  10:30 AM [facility] [place] [phone]   Future Orders Complete By Expires   Diet - low sodium heart healthy  As directed    Discharge instructions  As directed    Comments:     Get labs in 1 weel See oncology in 2 weeks Low salt diet as far as possible please Continue your chronic medications-some changes have been made-look at your [facility] carefully when d/c home Continue lasix 1 time daily until seen by regular MD   Increase activity slowly  As directed        Medication List    STOP taking these medications       triamterene-hydrochlorothiazide 37.5-25 MG per tablet  Commonly known as:  MAXZIDE-25      TAKE these medications       calcium carbonate 1250 MG tablet  Commonly known as:  OS-CAL - dosed in mg of elemental calcium  Take 1,250 mg by mouth daily as needed (on occasion).     carvedilol 3.125 MG tablet  Commonly known as:  COREG  Take 1 tablet (3.125 mg total) by mouth 2 (two) times daily with a meal.     Co Q-10 100 MG Caps  Take 100 mg by mouth daily as needed (on occasion).     dexamethasone 4 MG tablet  Commonly known as:  DECADRON  Take 5 pills once  a WEEK with food.     diphenoxylate-atropine 2.5-0.025 MG per tablet  Commonly known as:  LOMOTIL  Take 1-2 tablets by mouth 4 (four) times daily as needed for diarrhea or loose stools.     famciclovir 500 MG tablet  Commonly known as:  FAMVIR  Take 1 a day to prevent shingles.     fentaNYL 12 MCG/HR  Commonly known as:  DURAGESIC - dosed mcg/hr  Place 12.5 mcg onto the skin as needed. NOT USING AS DIRECTED MAY USE ONCE A WEEK     furosemide 20 MG tablet  Commonly known as:  LASIX  Take 1 tablet (20 mg total) by mouth daily.     HYDROmorphone 2 MG tablet  Commonly known as:  DILAUDID  Take 1 tablet (2 mg total) by mouth every 6 (six) hours as needed.     lansoprazole 30 MG capsule  Commonly known as:  PREVACID  Take 1 capsule (30 mg total) by mouth daily.      Lenalidomide 20 MG Caps  Commonly known as:  REVLIMID  Take 20 mg by mouth every morning. 21 days on 7 days off. Auth # [id number]     MAGNESIUM-ZINC PO  Take 1 tablet by mouth daily as needed (constipation).     ondansetron 8 MG tablet  Commonly known as:  ZOFRAN  Take 1 tablet (8 mg total) by mouth every 12 (twelve) hours as needed for nausea.     polyethylene glycol packet  Commonly known as:  MIRALAX / GLYCOLAX  Take 17 g by mouth 2 (two) times daily as needed. For constipation     potassium chloride SA 20 MEQ tablet  Commonly known as:  K-DUR,KLOR-CON  Take 20 mEq by mouth daily.     prochlorperazine 10 MG tablet  Commonly known as:  COMPAZINE  Take 1 tablet (10 mg total) by mouth every 6 (six) hours as needed.     VAPORIZING CHEST RUB EX  Apply 1 application topically daily. Applies to chest as needed       No Known Allergies     Follow-up Information   Follow up with Loralie Champagne, MD On 01/23/2014. (See Dr. Aundra Dubin at 1:20 pm. Garage gate code [phone])    Specialty:  Cardiology   Contact information:   [place]  [place] [address] [phone]        The results of significant diagnostics from this hospitalization (including imaging, microbiology, ancillary and laboratory) are listed below for reference.    Significant Diagnostic Studies: Dg Chest 2 View  01/18/2014   CLINICAL DATA:  CHF  EXAM: CHEST  2 VIEW  COMPARISON:  DG CHEST 2 VIEW dated 01/16/2014; DG CHEST 2 VIEW dated 01/23/2013  FINDINGS: The lungs are hyperinflated. There is flattening of the diaphragms. Cardiac silhouette is enlarged. Linear areas of increased density projects within the right middle lobe and left lower lobe. There is mild ground-glass density within the left lung base. Blunting of the costophrenic angles is appreciated. No acute osseous abnormalities appreciated. Chronic compression deformities within the lower thoracic spine.  IMPRESSION: Near complete interval  resolution of the previous described pulmonary edema  Discoid atelectasis versus scarring right middle lobe and left lung base. Small bilateral effusions.  Likely scarring within the left lung base though a component of residual asymmetric edema cannot be excluded.  COPD   Electronically Signed   By: Margaree Mackintosh M.D.   On: 01/18/2014 16:09   Dg Chest  2 View  01/16/2014   CLINICAL DATA:  Increase shortness of breath.  EXAM: CHEST  2 VIEW  COMPARISON:  01/23/2013  FINDINGS: There is increased cardiomegaly with new pulmonary vascular congestion and mild bilateral interstitial pulmonary edema with small bilateral pleural effusions.  There are multiple compression fractures in the thoracic spine with vertebroplasty at T11. No acute compression fractures.  IMPRESSION: New cardiomegaly with pulmonary vascular congestion and mild interstitial edema with small bilateral effusions.   Electronically Signed   By: Rozetta Nunnery M.D.   On: 01/16/2014 15:46   Ir Fluoro Guide Cv Line Left  01/19/2014   CLINICAL DATA:  Breast cancer.  Multiple myeloma.  EXAM: TUNNEL POWER PORT PLACEMENT WITH SUBCUTANEOUS POCKET UTILIZING ULTRASOUND & FLOUROSCOPY  FLUOROSCOPY TIME:  1.0 minutes  MEDICATIONS AND MEDICAL HISTORY: Versed two mg, Fentanyl 100 mcg.  As antibiotic prophylaxis, Ancef was ordered pre-procedure and administered intravenously within one hour of incision.  ANESTHESIA/SEDATION: Moderate sedation time: 30 minutes  CONTRAST:  None  PROCEDURE: After written informed consent was obtained, patient was placed in the supine position on angiographic table. The left neck and chest was prepped and draped in a sterile fashion. Lidocaine was utilized for local anesthesia. The left internal jugular vein was noted to be patent initially with ultrasound. Under sonographic guidance, a micropuncture needle was inserted into the left IJ vein (Ultrasound and fluoroscopic image documentation was performed). The needle was removed over an 018  wire which was exchanged for a Amplatz. This was advanced into the IVC. An 8-French dilator was advanced over the Amplatz.  A small incision was made in the left upper chest over the anterior left second rib. Utilizing blunt dissection, a subcutaneous pocket was created in the caudal direction. The pocket was irrigated with a copious amount of sterile normal saline. The port catheter was tunneled from the chest incision, and out the neck incision. The reservoir was inserted into the subcutaneous pocket and secured with two 3-0 Ethilon stitches. A peel-away sheath was advanced over the Amplatz wire. The port catheter was cut to measure length and inserted through the peel-away sheath. The peel-away sheath was removed. The chest incision was closed with 3-0 Vicryl interrupted stitches for the subcutaneous tissue and a running of 4-0 Vicryl subcuticular stitch for the skin. The neck incision was closed with a 4-0 Vicryl subcuticular stitch. Derma-bond was applied to both surgical incisions. The port reservoir was flushed and instilled with heparinized saline. No complications.  FINDINGS: A left IJ vein Port-A-Cath is in place with its tip at the cavoatrial junction.  IMPRESSION: Successful 8 French left internal jugular vein power port placement with its tip at the SVC/RA junction.   Electronically Signed   By: Maryclare Bean M.D.   On: 01/19/2014 16:08   Ir US Guide Vasc Access Left  01/19/2014   CLINICAL DATA:  Breast cancer.  Multiple myeloma.  EXAM: TUNNEL POWER PORT PLACEMENT WITH SUBCUTANEOUS POCKET UTILIZING ULTRASOUND & FLOUROSCOPY  FLUOROSCOPY TIME:  1.0 minutes  MEDICATIONS AND MEDICAL HISTORY: Versed two mg, Fentanyl 100 mcg.  As antibiotic prophylaxis, Ancef was ordered pre-procedure and administered intravenously within one hour of incision.  ANESTHESIA/SEDATION: Moderate sedation time: 30 minutes  CONTRAST:  None  PROCEDURE: After written informed consent was obtained, patient was placed in the supine  position on angiographic table. The left neck and chest was prepped and draped in a sterile fashion. Lidocaine was utilized for local anesthesia. The left internal jugular vein was noted to be  patent initially with ultrasound. Under sonographic guidance, a micropuncture needle was inserted into the left IJ vein (Ultrasound and fluoroscopic image documentation was performed). The needle was removed over an 018 wire which was exchanged for a Amplatz. This was advanced into the IVC. An 8-French dilator was advanced over the Amplatz.  A small incision was made in the left upper chest over the anterior left second rib. Utilizing blunt dissection, a subcutaneous pocket was created in the caudal direction. The pocket was irrigated with a copious amount of sterile normal saline. The port catheter was tunneled from the chest incision, and out the neck incision. The reservoir was inserted into the subcutaneous pocket and secured with two 3-0 Ethilon stitches. A peel-away sheath was advanced over the Amplatz wire. The port catheter was cut to measure length and inserted through the peel-away sheath. The peel-away sheath was removed. The chest incision was closed with 3-0 Vicryl interrupted stitches for the subcutaneous tissue and a running of 4-0 Vicryl subcuticular stitch for the skin. The neck incision was closed with a 4-0 Vicryl subcuticular stitch. Derma-bond was applied to both surgical incisions. The port reservoir was flushed and instilled with heparinized saline. No complications.  FINDINGS: A left IJ vein Port-A-Cath is in place with its tip at the cavoatrial junction.  IMPRESSION: Successful 8 French left internal jugular vein power port placement with its tip at the SVC/RA junction.   Electronically Signed   By: Maryclare Bean M.D.   On: 01/19/2014 16:08   Dg Chest Port 1 View  01/19/2014   CLINICAL DATA:  Edema and CHF.  EXAM: PORTABLE CHEST - 1 VIEW  COMPARISON:  01/18/2014 and 01/16/2014  FINDINGS: Stable  enlargement of the cardiac silhouette. The trachea is midline. Improved aeration at the right lung base may represent decreased right pleural fluid. Persistent densities at the left lung base are suggestive for atelectasis and probable small effusion. Patient has had a vertebral augmentation procedure in the lower thoracic spine. Upper lungs are clear without overt pulmonary edema.  IMPRESSION: Improved aeration at the right lung base may represent decreased pleural fluid.  Persistent densities at the left lung base probably represent a combination of atelectasis and small effusion.  Cardiomegaly.   Electronically Signed   By: Markus Daft M.D.   On: 01/19/2014 08:02   US Abdomen Limited Ruq  01/17/2014   CLINICAL DATA:  Right upper quadrant abdominal pain, history of hypertension, breast cancer and multiple myeloma  EXAM: US ABDOMEN LIMITED - RIGHT UPPER QUADRANT  COMPARISON:  CT ABD/PELVIS W CM dated 11/11/2012; CT ABD/PELVIS W CM dated 04/01/2010  FINDINGS: Gallbladder:  Sonographically normal. No echogenic gallstones or gall sludge. No gallbladder wall thickening or pericholecystic fluid. Negative sonographic Murphy's sign.  Common bile duct:  Diameter: Normal in size measuring 1.8 mm in diameter.  Liver:  Homogeneous hepatic echotexture. Note is made of an approximately 1.3 x 1.1 x 1.0 cm hyperechoic lesion within the left lobe of the liver (representative images 22 and 24), not definitely seen on prior abdominal CTs. No definite evidence of intrahepatic biliary ductal dilatation. No ascites.  IMPRESSION: Indeterminate approximately 1.3 cm hyperechoic lesion within the left lobe of the liver - while possibly representative of a hepatic hemangioma, this lesion was not definitely identified on prior abdominal CT examinations and given history of breast cancer, metastatic disease is not excluded. As such, further evaluation with nonemergent contrast enhanced abdominal MRI is recommended.  These results will be  called to the ordering  clinician or representative by the Radiologist Assistant, and communication documented in the PACS Dashboard.   Electronically Signed   By: Sandi Mariscal M.D.   On: 01/17/2014 09:05    Microbiology: No results found for this or any previous visit (from the past 240 hour(s)).   Labs: Basic Metabolic Panel:  Recent Labs Lab 01/18/14 0345 01/19/14 0355 01/20/14 0520 01/21/14 0500 01/22/14 0457  NA 129* 128* 134* 136* 137  K 4.7 4.1 3.4* 3.2* 4.2  CL 91* 90* 93* 97 99  CO2 24 23 25 28 28   GLUCOSE 141* 145* 140* 115* 117*  BUN 56* 55* 50* 36* 31*  CREATININE 1.83* 1.58* 1.59* 1.27* 1.21*  CALCIUM 9.3 9.1 9.5 9.5 9.5  PHOS  --   --  3.3  --  2.3   Liver Function Tests:  Recent Labs Lab 01/16/14 1615 01/19/14 0355 01/20/14 0520 01/21/14 0500 01/22/14 0457  AST 41* 46*  --  28  --   ALT 41* 59*  --  37*  --   ALKPHOS 142* 127*  --  124*  --   BILITOT 0.6 0.4  --  0.5  --   PROT 7.8 6.4  --  6.5  --   ALBUMIN 3.2* 2.9* 3.0* 2.8* 2.8*   No results found for this basename: LIPASE, AMYLASE,  in the last 168 hours No results found for this basename: AMMONIA,  in the last 168 hours CBC:  Recent Labs Lab 01/16/14 1615 01/18/14 0805 01/19/14 0355 01/20/14 0520 01/21/14 0500 01/22/14 0500  WBC 5.9 4.5 4.9 5.3 4.7 5.6  NEUTROABS 4.7  --   --   --   --   --   HGB 9.0* 8.6* 8.0* 10.9* 10.8* 11.0*  HCT 27.4* 25.2* 24.3* 33.1* 32.9* 34.8*  MCV 97.2 95.8 97.6 95.7 97.3 100.9*  PLT 228 195 177 176 168 188   Cardiac Enzymes:  Recent Labs Lab 01/16/14 2015 01/17/14 0117 01/17/14 0819  TROPONINI <0.30 <0.30 <0.30   BNP: BNP (last 3 results)  Recent Labs  01/16/14 1615  PROBNP 7214.0*   CBG: No results found for this basename: GLUCAP,  in the last 168 hours     Signed:  Nita Sells  [facility] 01/22/2014, 10:02 AM

## 2014-01-22 NOTE — Progress Notes (Signed)
Jacqueline Rivas is doing okay. She still is not able to do all that much. She still needs more physical therapy in my mind.  Her legs look much better. She still is a little bit of edema but they do look better.  Physical therapy is really going to be achy right now.  She got the 2 units of blood. This has helped her. She has a Port-A-Cath in. I think we can take out the peripheral IV.  She's had no bleeding.  Her appetite might be able bit better. She did have a bowel movement. This made her quite happy.  Her hemoglobin is 11. White cell count 5.6. Platelet count 188.  Her blood pressure is 112/68. Pulse is 80. Lungs sound pretty clear. Cardiac exam regular rate and rhythm. She has a 1/6 murmur. Abdomen is soft. There is no palpable liver or spleen tip. Extremities shows 1+ edema.  I really don't see her going home right now. I really think she needs some physical therapy. She probably needs some kind of cardiac rehabilitation. I don't know if she would be a candidate for inpatient rehabilitation over at [facility].  Ramond Dial 24:6-7

## 2014-01-23 ENCOUNTER — Encounter (HOSPITAL_COMMUNITY): Payer: Medicare Other

## 2014-01-23 ENCOUNTER — Telehealth: Payer: Self-pay | Admitting: *Deleted

## 2014-01-23 ENCOUNTER — Encounter: Payer: Self-pay | Admitting: *Deleted

## 2014-01-23 NOTE — Progress Notes (Signed)
Tonya from [facility] called to ask if pt needs to be on Revlimid and Decadron.  Per Dr. Marin Olp, pt may stay off of these meds until he sees her on 02/15/14.

## 2014-01-26 DIAGNOSIS — F411 Generalized anxiety disorder: Secondary | ICD-10-CM | POA: Diagnosis not present

## 2014-01-29 ENCOUNTER — Encounter (HOSPITAL_COMMUNITY): Payer: Self-pay

## 2014-01-29 ENCOUNTER — Ambulatory Visit (HOSPITAL_BASED_OUTPATIENT_CLINIC_OR_DEPARTMENT_OTHER)
Admission: RE | Admit: 2014-01-29 | Discharge: 2014-01-29 | Disposition: A | Discharge status: Home / Self Care | Payer: Medicare Other | Source: Ambulatory Visit | Attending: Internal Medicine | Admitting: Internal Medicine

## 2014-01-29 ENCOUNTER — Telehealth (HOSPITAL_COMMUNITY): Payer: Self-pay | Admitting: Cardiology

## 2014-01-29 ENCOUNTER — Inpatient Hospital Stay (HOSPITAL_COMMUNITY)
Admission: AD | Admit: 2014-01-29 | Discharge: 2014-02-05 | DRG: 292 | Disposition: A | Discharge status: Skilled Nursing Facility | Payer: Medicare Other | Source: Ambulatory Visit | Attending: Internal Medicine | Admitting: Internal Medicine

## 2014-01-29 ENCOUNTER — Telehealth: Payer: Self-pay | Admitting: Physician Assistant

## 2014-01-29 VITALS — BP 106/62 | HR 91 | Wt 135.0 lb

## 2014-01-29 DIAGNOSIS — C773 Secondary and unspecified malignant neoplasm of axilla and upper limb lymph nodes: Secondary | ICD-10-CM

## 2014-01-29 DIAGNOSIS — N179 Acute kidney failure, unspecified: Secondary | ICD-10-CM | POA: Diagnosis not present

## 2014-01-29 DIAGNOSIS — I129 Hypertensive chronic kidney disease with stage 1 through stage 4 chronic kidney disease, or unspecified chronic kidney disease: Secondary | ICD-10-CM | POA: Diagnosis present

## 2014-01-29 DIAGNOSIS — I428 Other cardiomyopathies: Secondary | ICD-10-CM | POA: Diagnosis present

## 2014-01-29 DIAGNOSIS — M1990 Unspecified osteoarthritis, unspecified site: Secondary | ICD-10-CM | POA: Diagnosis not present

## 2014-01-29 DIAGNOSIS — Z602 Problems related to living alone: Secondary | ICD-10-CM

## 2014-01-29 DIAGNOSIS — Z853 Personal history of malignant neoplasm of breast: Secondary | ICD-10-CM | POA: Diagnosis not present

## 2014-01-29 DIAGNOSIS — I5023 Acute on chronic systolic (congestive) heart failure: Principal | ICD-10-CM | POA: Diagnosis present

## 2014-01-29 DIAGNOSIS — C50919 Malignant neoplasm of unspecified site of unspecified female breast: Secondary | ICD-10-CM

## 2014-01-29 DIAGNOSIS — Z9221 Personal history of antineoplastic chemotherapy: Secondary | ICD-10-CM

## 2014-01-29 DIAGNOSIS — Z79899 Other long term (current) drug therapy: Secondary | ICD-10-CM | POA: Diagnosis not present

## 2014-01-29 DIAGNOSIS — K5730 Diverticulosis of large intestine without perforation or abscess without bleeding: Secondary | ICD-10-CM | POA: Diagnosis present

## 2014-01-29 DIAGNOSIS — R269 Unspecified abnormalities of gait and mobility: Secondary | ICD-10-CM | POA: Diagnosis not present

## 2014-01-29 DIAGNOSIS — N183 Chronic kidney disease, stage 3 unspecified: Secondary | ICD-10-CM | POA: Diagnosis present

## 2014-01-29 DIAGNOSIS — Z923 Personal history of irradiation: Secondary | ICD-10-CM

## 2014-01-29 DIAGNOSIS — I5043 Acute on chronic combined systolic (congestive) and diastolic (congestive) heart failure: Secondary | ICD-10-CM

## 2014-01-29 DIAGNOSIS — J9600 Acute respiratory failure, unspecified whether with hypoxia or hypercapnia: Secondary | ICD-10-CM | POA: Diagnosis not present

## 2014-01-29 DIAGNOSIS — Z825 Family history of asthma and other chronic lower respiratory diseases: Secondary | ICD-10-CM

## 2014-01-29 DIAGNOSIS — E785 Hyperlipidemia, unspecified: Secondary | ICD-10-CM | POA: Diagnosis present

## 2014-01-29 DIAGNOSIS — Z807 Family history of other malignant neoplasms of lymphoid, hematopoietic and related tissues: Secondary | ICD-10-CM | POA: Diagnosis not present

## 2014-01-29 DIAGNOSIS — R4181 Age-related cognitive decline: Secondary | ICD-10-CM | POA: Diagnosis present

## 2014-01-29 DIAGNOSIS — I079 Rheumatic tricuspid valve disease, unspecified: Secondary | ICD-10-CM | POA: Diagnosis present

## 2014-01-29 DIAGNOSIS — Z5189 Encounter for other specified aftercare: Secondary | ICD-10-CM | POA: Diagnosis not present

## 2014-01-29 DIAGNOSIS — R279 Unspecified lack of coordination: Secondary | ICD-10-CM | POA: Diagnosis not present

## 2014-01-29 DIAGNOSIS — K7689 Other specified diseases of liver: Secondary | ICD-10-CM | POA: Diagnosis not present

## 2014-01-29 DIAGNOSIS — I509 Heart failure, unspecified: Secondary | ICD-10-CM | POA: Diagnosis not present

## 2014-01-29 DIAGNOSIS — Z66 Do not resuscitate: Secondary | ICD-10-CM | POA: Diagnosis present

## 2014-01-29 DIAGNOSIS — Z8249 Family history of ischemic heart disease and other diseases of the circulatory system: Secondary | ICD-10-CM

## 2014-01-29 DIAGNOSIS — C9000 Multiple myeloma not having achieved remission: Secondary | ICD-10-CM | POA: Diagnosis present

## 2014-01-29 DIAGNOSIS — E875 Hyperkalemia: Secondary | ICD-10-CM | POA: Diagnosis not present

## 2014-01-29 DIAGNOSIS — R52 Pain, unspecified: Secondary | ICD-10-CM | POA: Diagnosis not present

## 2014-01-29 DIAGNOSIS — R0609 Other forms of dyspnea: Secondary | ICD-10-CM | POA: Diagnosis not present

## 2014-01-29 DIAGNOSIS — M6281 Muscle weakness (generalized): Secondary | ICD-10-CM | POA: Diagnosis not present

## 2014-01-29 LAB — CBC WITH DIFFERENTIAL/PLATELET
Basophils Absolute: 0 10*3/uL (ref 0.0–0.1)
Basophils Relative: 0 % (ref 0–1)
EOS ABS: 0 10*3/uL (ref 0.0–0.7)
Eosinophils Relative: 1 % (ref 0–5)
HEMATOCRIT: 39.4 % (ref 36.0–46.0)
Hemoglobin: 12.8 g/dL (ref 12.0–15.0)
LYMPHS ABS: 1 10*3/uL (ref 0.7–4.0)
Lymphocytes Relative: 16 % (ref 12–46)
MCH: 33.2 pg (ref 26.0–34.0)
MCHC: 32.5 g/dL (ref 30.0–36.0)
MCV: 102.3 fL — AB (ref 78.0–100.0)
MONO ABS: 0.7 10*3/uL (ref 0.1–1.0)
MONOS PCT: 11 % (ref 3–12)
Neutro Abs: 4.4 10*3/uL (ref 1.7–7.7)
Neutrophils Relative %: 72 % (ref 43–77)
Platelets: 197 10*3/uL (ref 150–400)
RBC: 3.85 MIL/uL — AB (ref 3.87–5.11)
RDW: 20.4 % — ABNORMAL HIGH (ref 11.5–15.5)
WBC: 6.1 10*3/uL (ref 4.0–10.5)

## 2014-01-29 LAB — COMPREHENSIVE METABOLIC PANEL
ALT: 59 U/L — ABNORMAL HIGH (ref 0–35)
AST: 39 U/L — ABNORMAL HIGH (ref 0–37)
Albumin: 3.2 g/dL — ABNORMAL LOW (ref 3.5–5.2)
Alkaline Phosphatase: 112 U/L (ref 39–117)
BUN: 37 mg/dL — AB (ref 6–23)
CO2: 20 mEq/L (ref 19–32)
CREATININE: 1.37 mg/dL — AB (ref 0.50–1.10)
Calcium: 10.1 mg/dL (ref 8.4–10.5)
Chloride: 100 mEq/L (ref 96–112)
GFR calc Af Amer: 41 mL/min — ABNORMAL LOW (ref >90)
GFR calc non Af Amer: 36 mL/min — ABNORMAL LOW (ref >90)
Glucose, Bld: 170 mg/dL — ABNORMAL HIGH (ref 70–99)
Potassium: 5.7 mEq/L — ABNORMAL HIGH (ref 3.7–5.3)
Sodium: 136 mEq/L — ABNORMAL LOW (ref 137–147)
TOTAL PROTEIN: 7.1 g/dL (ref 6.0–8.3)
Total Bilirubin: 1 mg/dL (ref 0.3–1.2)

## 2014-01-29 LAB — PRO B NATRIURETIC PEPTIDE: Pro B Natriuretic peptide (BNP): 4550 pg/mL — ABNORMAL HIGH (ref 0–450)

## 2014-01-29 LAB — CARBOXYHEMOGLOBIN
Carboxyhemoglobin: 1.3 % (ref 0.5–1.5)
Methemoglobin: 0.7 % (ref 0.0–1.5)
O2 SAT: 55.1 %
Total hemoglobin: 12.9 g/dL (ref 12.0–16.0)

## 2014-01-29 LAB — MRSA PCR SCREENING: MRSA by PCR: NEGATIVE

## 2014-01-29 LAB — MAGNESIUM: MAGNESIUM: 2 mg/dL (ref 1.5–2.5)

## 2014-01-29 MED ORDER — POTASSIUM CHLORIDE CRYS ER 20 MEQ PO TBCR
40.0000 meq | EXTENDED_RELEASE_TABLET | Freq: Every day | ORAL | Status: DC
  Administered 2014-01-29 – 2014-01-31 (×3): 40 meq via ORAL
  Filled 2014-01-29 (×4): qty 2

## 2014-01-29 MED ORDER — SODIUM CHLORIDE 0.9 % IJ SOLN
3.0000 mL | Freq: Two times a day (BID) | INTRAMUSCULAR | Status: DC
  Administered 2014-01-29 – 2014-01-30 (×3): 3 mL via INTRAVENOUS
  Administered 2014-01-31: 10 mL via INTRAVENOUS
  Administered 2014-01-31 – 2014-02-03 (×5): 3 mL via INTRAVENOUS
  Administered 2014-02-03: 10 mL via INTRAVENOUS
  Administered 2014-02-04 – 2014-02-05 (×2): 3 mL via INTRAVENOUS

## 2014-01-29 MED ORDER — ONDANSETRON HCL 4 MG/2ML IJ SOLN: 4.0000 mg | Freq: Four times a day (QID) | INTRAMUSCULAR | Status: DC | PRN

## 2014-01-29 MED ORDER — ENOXAPARIN SODIUM 40 MG/0.4ML ~~LOC~~ SOLN
40.0000 mg | SUBCUTANEOUS | Status: DC
  Administered 2014-01-29: 40 mg via SUBCUTANEOUS
  Filled 2014-01-29 (×2): qty 0.4

## 2014-01-29 MED ORDER — FUROSEMIDE 10 MG/ML IJ SOLN
80.0000 mg | Freq: Two times a day (BID) | INTRAMUSCULAR | Status: DC
  Administered 2014-01-29 – 2014-01-31 (×4): 80 mg via INTRAVENOUS
  Filled 2014-01-29 (×5): qty 8

## 2014-01-29 MED ORDER — ACETAMINOPHEN 325 MG PO TABS: 650.0000 mg | ORAL_TABLET | ORAL | Status: DC | PRN

## 2014-01-29 MED ORDER — SODIUM CHLORIDE 0.9 % IV SOLN: 250.0000 mL | INTRAVENOUS | Status: DC | PRN

## 2014-01-29 MED ORDER — ALTEPLASE 2 MG IJ SOLR
2.0000 mg | Freq: Once | INTRAMUSCULAR | Status: AC
  Administered 2014-01-29: 2 mg
  Filled 2014-01-29: qty 2

## 2014-01-29 MED ORDER — SODIUM CHLORIDE 0.9 % IJ SOLN: 3.0000 mL | INTRAMUSCULAR | Status: DC | PRN

## 2014-01-29 MED ORDER — FENTANYL 12 MCG/HR TD PT72
12.5000 ug | MEDICATED_PATCH | TRANSDERMAL | Status: DC
  Administered 2014-01-29 – 2014-02-04 (×3): 12.5 ug via TRANSDERMAL
  Filled 2014-01-29 (×3): qty 1

## 2014-01-29 MED ORDER — ENALAPRIL MALEATE 2.5 MG PO TABS
2.5000 mg | ORAL_TABLET | Freq: Two times a day (BID) | ORAL | Status: DC
Start: 2014-01-29 — End: 2014-01-30
  Administered 2014-01-29: 2.5 mg via ORAL
  Filled 2014-01-29 (×3): qty 1

## 2014-01-29 MED ORDER — HYDROMORPHONE HCL 2 MG PO TABS
2.0000 mg | ORAL_TABLET | Freq: Four times a day (QID) | ORAL | Status: DC | PRN
  Administered 2014-01-29 – 2014-02-05 (×11): 2 mg via ORAL
  Filled 2014-01-29 (×11): qty 1

## 2014-01-29 NOTE — Telephone Encounter (Signed)
Pt will be admitted from CHF clinic Cpt code [phone] [id number] 428.0 With pts current insurance PRIMARY Medicare A and B- no pre cert req'd            SECONDARY Primary Physician- no pre cert required per [facility] R.- 01/29/14

## 2014-01-29 NOTE — Telephone Encounter (Signed)
Error. No call made

## 2014-01-29 NOTE — Progress Notes (Signed)
Patient ID: Jacqueline Rivas, female   DOB: 12/17/1931, 80 y.o.   MRN: [id number]     HPI: Jacqueline Rivas is a 80 y.o. female with a history of multiple myeloma (diagnosed 11/2012 - treated with XRT/chemo), breast CA, CKD and recent diagnosis of systolic HF.   Apparently her EF was normal in September 2014. She was on Herceptin for breast CA with Dr. Marin Olp for 1 year, starting in June 2014. In February, she had an echo which showed an EF of 20-25%. The Herceptin was discontinued. She was admitted last week to [facility] for volume overload. She was diuresed. Discharged to [facility] on 01/22/14. Weight ~127. Discharged on lasix 72m daily. In hospital diuresis was limited by low BP. Incidental finding of lives mass - ? mets  Over past week has progressive LE edema and dyspnea. +Orthopnea/PND. Can't sleep in bed. Sleeps sitting up in recliner. SOB with minimal exertion  She had echo 4/1: LVEF 20% RV mildly dilated. Moderate TR  01/22/14: Na 137 k 4.2 Cr 1.27   Review of Systems: [y] = yes, [ ]  = no   General: Weight gain [Blue.Reese]; Weight loss [ ] ; Anorexia [ y]; Fatigue [Blue.Reese]; Fever [ ] ; Chills [ ] ; Weakness [Blue.Reese]  Cardiac: Chest pain/pressure [ ] ; Resting SOB [Blue.Reese]; Exertional SOB [Blue.Reese]; Orthopnea [Blue.Reese]; Pedal Edema [Blue.Reese]; Palpitations [ ] ; Syncope [ ] ; Presyncope [ ] ; Paroxysmal nocturnal dyspnea[ ]   Pulmonary: Cough [ ] ; Wheezing[ ] ; Hemoptysis[ ] ; Sputum [ ] ; Snoring [ ]   GI: Vomiting[ ] ; Dysphagia[ ] ; Melena[ ] ; Hematochezia [ ] ; Heartburn[ ] ; Abdominal pain [ ] ; Constipation [ ] ; Diarrhea [ ] ; BRBPR [ ]   GU: Hematuria[ ] ; Dysuria [ ] ; Nocturia[ ]   Vascular: Pain in legs with walking [ ] ; Pain in feet with lying flat [ ] ; Non-healing sores [ ] ; Stroke [ ] ; TIA [ ] ; Slurred speech [ ] ;  Neuro: Headaches[ ] ; Vertigo[ ] ; Seizures[ ] ; Paresthesias[ ] ;Blurred vision [ ] ; Diplopia [ ] ; Vision changes [ ]   Ortho/Skin: Arthritis [Blue.Reese]; Joint pain [Blue.Reese]; Muscle pain [ ] ; Joint swelling [ ] ; Back Pain [ ] ; Rash [ ]   Psych:  Depression[ ] ; Anxiety[ ]   Heme: Bleeding problems [ ] ; Clotting disorders [ ] ; Anemia [ ]   Endocrine: Diabetes [ ] ; Thyroid dysfunction[ ]     Past Medical History  Diagnosis Date  . Diverticulosis   . Hiatal hernia   . Hyperlipidemia   . Hypertension   . Breast mass   . Multiple myeloma   . Breast cancer   . Arthritis   . S/P radiation therapy 12/15/12 - 12/21/12    T6 - L1 / 18.75 Gy / 5 Fractions  . Bilateral atelectasis   . Systolic heart failure     EF 20% 11/2013  . Protein-calorie malnutrition, severe 01/20/2014    Current Outpatient Prescriptions  Medication Sig Dispense Refill  . calcium carbonate (OS-CAL - DOSED IN MG OF ELEMENTAL CALCIUM) 1250 MG tablet Take 1,250 mg by mouth daily as needed (on occasion).       . Camphor-Eucalyptus-Menthol (VAPORIZING CHEST RUB EX) Apply 1 application topically daily. Applies to chest as needed      . carvedilol (COREG) 3.125 MG tablet Take 1 tablet (3.125 mg total) by mouth 2 (two) times daily with a meal.  60 tablet  3  . Coenzyme Q10 (CO Q-10) 100 MG CAPS Take 100 mg by mouth daily as needed (on occasion).       .Marland Kitchen  dexamethasone (DECADRON) 4 MG tablet Take 5 pills once a WEEK with food.  60 tablet  2  . diphenoxylate-atropine (LOMOTIL) 2.5-0.025 MG per tablet Take 1-2 tablets by mouth 4 (four) times daily as needed for diarrhea or loose stools.  100 tablet  0  . famciclovir (FAMVIR) 500 MG tablet Take 1 a day to prevent shingles.  30 tablet  6  . fentaNYL (DURAGESIC - DOSED MCG/HR) 12 MCG/HR Place 12.5 mcg onto the skin as needed. NOT USING AS DIRECTED MAY USE ONCE A WEEK      . furosemide (LASIX) 20 MG tablet Take 1 tablet (20 mg total) by mouth daily.  30 tablet  0  . HYDROmorphone (DILAUDID) 2 MG tablet Take 1 tablet (2 mg total) by mouth every 6 (six) hours as needed.  20 tablet  0  . lansoprazole (PREVACID) 30 MG capsule Take 1 capsule (30 mg total) by mouth daily.  30 capsule  4  . Lenalidomide (REVLIMID) 20 MG CAPS Take 20 mg by  mouth every morning. 21 days on 7 days off. Auth # [id number]  21 capsule  0  . MAGNESIUM-ZINC PO Take 1 tablet by mouth daily as needed (constipation).      . ondansetron (ZOFRAN) 8 MG tablet Take 1 tablet (8 mg total) by mouth every 12 (twelve) hours as needed for nausea.  20 tablet  0  . polyethylene glycol (MIRALAX / GLYCOLAX) packet Take 17 g by mouth 2 (two) times daily as needed. For constipation      . potassium chloride SA (K-DUR,KLOR-CON) 20 MEQ tablet Take 20 mEq by mouth daily.      . prochlorperazine (COMPAZINE) 10 MG tablet Take 1 tablet (10 mg total) by mouth every 6 (six) hours as needed.  30 tablet  0   No current facility-administered medications for this encounter.    No Known Allergies  History   Social History  . Marital Status: Widowed    Spouse Name: N/A    Number of Children: N/A  . Years of Education: N/A   Occupational History  . Retired    Social History Main Topics  . Smoking status: Never Smoker   . Smokeless tobacco: Never Used     Comment: never used product  . Alcohol Use: No     Comment: heavy alcohol use until 2013  . Drug Use: No  . Sexual Activity: No   Other Topics Concern  . Not on file   Social History Narrative   Has apartment, lives alone.  Uses a cane around apartment.  Has walker. Drives.      Family History  Problem Relation Age of Onset  . Prostate cancer Father   . Heart attack Brother   . Asthma Maternal Grandmother   . Multiple sclerosis Sister   . Lymphoma Son     large B-cell  . Heart attack Father     PHYSICAL EXAM: Filed Vitals:   01/29/14 1350  BP: 106/62  Pulse: 91  Weight: 135 lb (61.236 kg)  SpO2: 96%    General:  Chronically ill appearing. Frail Mildly dyspneic HEENT: normal Neck: supple. JVP to jaw with prominent CV waves Carotids 2+ bilat; no bruits. No lymphadenopathy or thryomegaly appreciated. Cor: PMI laterally displaced. Regular rate & rhythm. + s3. 2/6 TR/MR Lungs: decreased at bases Abdomen:  soft, nontender, + distended. No hepatosplenomegaly. No bruits or masses. Good bowel sounds. Extremities: no cyanosis, clubbing, rash, cool 3-4+ edema Neuro: alert & oriented x 3,  cranial nerves grossly intact. moves all 4 extremities w/o difficulty. Affect pleasant.   ASSESSMENT & PLAN: 1. Acute on chronic systolic HF with NYHA IV symptoms and marked volume overload due to presumed NICM EF 20% 2. CKD, stage 3 3. Breast CA 4. Multiple myeloma 5. DNR/DNI  She appears to have low output HF with massive volume overload. Will admit. Check co-ox off porta-cth. Proceed with IV diuresis. Suspect she will need inotropic support. Prognosis guarded. Wrap legs.   Shaune Pascal Max Nuno,MD 3:24 PM

## 2014-01-29 NOTE — H&P (Signed)
ADVANCED HF H&P  HPI: Jacqueline Rivas is a 80 y.o. female with a history of multiple myeloma (diagnosed 11/2012 - treated with XRT/chemo), breast CA, CKD and recent diagnosis of systolic HF.   Apparently her EF was normal in September 2014. She was on Herceptin for breast CA with Dr. Marin Olp for 1 year, starting in June 2014. In February, she had an echo which showed an EF of 20-25%. The Herceptin was discontinued. She was admitted last week to [facility] for volume overload. She was diuresed. Discharged to [facility] on 01/22/14. Weight ~127. Discharged on lasix 68m daily. In hospital diuresis was limited by low BP. Incidental finding of lives mass - ? mets   Over past week has progressive LE edema and dyspnea. +Orthopnea/PND. Can't sleep in bed. Sleeps sitting up in recliner. SOB with minimal exertion   She had echo 4/1: LVEF 20% RV mildly dilated. Moderate TR   01/22/14: Na 137 k 4.2 Cr 1.27   Review of Systems: [y] = yes, [ ]  = no  General: Weight gain [Blue.Reese]; Weight loss [ ] ; Anorexia [ y]; Fatigue [Blue.Reese]; Fever [ ] ; Chills [ ] ; Weakness [Blue.Reese]  Cardiac: Chest pain/pressure [ ] ; Resting SOB [Blue.Reese]; Exertional SOB [Blue.Reese]; Orthopnea [Blue.Reese]; Pedal Edema [Blue.Reese]; Palpitations [ ] ; Syncope [ ] ; Presyncope [ ] ; Paroxysmal nocturnal dyspnea[ ]   Pulmonary: Cough [ ] ; Wheezing[ ] ; Hemoptysis[ ] ; Sputum [ ] ; Snoring [ ]   GI: Vomiting[ ] ; Dysphagia[ ] ; Melena[ ] ; Hematochezia [ ] ; Heartburn[ ] ; Abdominal pain [ ] ; Constipation [ ] ; Diarrhea [ ] ; BRBPR [ ]   GU: Hematuria[ ] ; Dysuria [ ] ; Nocturia[ ]   Vascular: Pain in legs with walking [ ] ; Pain in feet with lying flat [ ] ; Non-healing sores [ ] ; Stroke [ ] ; TIA [ ] ; Slurred speech [ ] ;  Neuro: Headaches[ ] ; Vertigo[ ] ; Seizures[ ] ; Paresthesias[ ] ;Blurred vision [ ] ; Diplopia [ ] ; Vision changes [ ]   Ortho/Skin: Arthritis [Blue.Reese]; Joint pain [Blue.Reese]; Muscle pain [ ] ; Joint swelling [ ] ; Back Pain [ ] ; Rash [ ]   Psych: Depression[ ] ; Anxiety[ ]   Heme: Bleeding problems [ ] ; Clotting  disorders [ ] ; Anemia [ ]   Endocrine: Diabetes [ ] ; Thyroid dysfunction[ ]    Past Medical History   Diagnosis  Date   .  Diverticulosis    .  Hiatal hernia    .  Hyperlipidemia    .  Hypertension    .  Breast mass    .  Multiple myeloma    .  Breast cancer    .  Arthritis    .  S/P radiation therapy  12/15/12 - 12/21/12     T6 - L1 / 18.75 Gy / 5 Fractions   .  Bilateral atelectasis    .  Systolic heart failure      EF 20% 11/2013   .  Protein-calorie malnutrition, severe  01/20/2014    Current Outpatient Prescriptions   Medication  Sig  Dispense  Refill   .  calcium carbonate (OS-CAL - DOSED IN MG OF ELEMENTAL CALCIUM) 1250 MG tablet  Take 1,250 mg by mouth daily as needed (on occasion).     .  Camphor-Eucalyptus-Menthol (VAPORIZING CHEST RUB EX)  Apply 1 application topically daily. Applies to chest as needed     .  carvedilol (COREG) 3.125 MG tablet  Take 1 tablet (3.125 mg total) by mouth 2 (two) times daily with a meal.  60 tablet  3   .  Coenzyme Q10 (CO Q-10) 100 MG CAPS  Take 100 mg by mouth daily as needed (on occasion).     Marland Kitchen  dexamethasone (DECADRON) 4 MG tablet  Take 5 pills once a WEEK with food.  60 tablet  2   .  diphenoxylate-atropine (LOMOTIL) 2.5-0.025 MG per tablet  Take 1-2 tablets by mouth 4 (four) times daily as needed for diarrhea or loose stools.  100 tablet  0   .  famciclovir (FAMVIR) 500 MG tablet  Take 1 a day to prevent shingles.  30 tablet  6   .  fentaNYL (DURAGESIC - DOSED MCG/HR) 12 MCG/HR  Place 12.5 mcg onto the skin as needed. NOT USING AS DIRECTED MAY USE ONCE A WEEK     .  furosemide (LASIX) 20 MG tablet  Take 1 tablet (20 mg total) by mouth daily.  30 tablet  0   .  HYDROmorphone (DILAUDID) 2 MG tablet  Take 1 tablet (2 mg total) by mouth every 6 (six) hours as needed.  20 tablet  0   .  lansoprazole (PREVACID) 30 MG capsule  Take 1 capsule (30 mg total) by mouth daily.  30 capsule  4   .  Lenalidomide (REVLIMID) 20 MG CAPS  Take 20 mg by mouth every  morning. 21 days on 7 days off. Auth # [id number]  21 capsule  0   .  MAGNESIUM-ZINC PO  Take 1 tablet by mouth daily as needed (constipation).     .  ondansetron (ZOFRAN) 8 MG tablet  Take 1 tablet (8 mg total) by mouth every 12 (twelve) hours as needed for nausea.  20 tablet  0   .  polyethylene glycol (MIRALAX / GLYCOLAX) packet  Take 17 g by mouth 2 (two) times daily as needed. For constipation     .  potassium chloride SA (K-DUR,KLOR-CON) 20 MEQ tablet  Take 20 mEq by mouth daily.     .  prochlorperazine (COMPAZINE) 10 MG tablet  Take 1 tablet (10 mg total) by mouth every 6 (six) hours as needed.  30 tablet  0    No current facility-administered medications for this encounter.    No Known Allergies  History    Social History   .  Marital Status:  Widowed     Spouse Name:  N/A     Number of Children:  N/A   .  Years of Education:  N/A    Occupational History   .  Retired     Social History Main Topics   .  Smoking status:  Never Smoker   .  Smokeless tobacco:  Never Used      Comment: never used product   .  Alcohol Use:  No      Comment: heavy alcohol use until 2013   .  Drug Use:  No   .  Sexual Activity:  No    Other Topics  Concern   .  Not on file    Social History Narrative    Has apartment, lives alone. Uses a cane around apartment. Has walker. Drives.    Family History   Problem  Relation  Age of Onset   .  Prostate cancer  Father    .  Heart attack  Brother    .  Asthma  Maternal Grandmother    .  Multiple sclerosis  Sister    .  Lymphoma  Son  large B-cell   .  Heart attack  Father     PHYSICAL EXAM:  Filed Vitals:    01/29/14 1350   BP:  106/62   Pulse:  91   Weight:  135 lb (61.236 kg)   SpO2:  96%    General: Chronically ill appearing. Frail Mildly dyspneic  HEENT: normal  Neck: supple. JVP to jaw with prominent CV waves Carotids 2+ bilat; no bruits. No lymphadenopathy or thryomegaly appreciated.  Cor: PMI laterally displaced. Regular rate &  rhythm. + s3. 2/6 TR/MR  Lungs: decreased at bases  Abdomen: soft, nontender, + distended. No hepatosplenomegaly. No bruits or masses. Good bowel sounds.  Extremities: no cyanosis, clubbing, rash, cool 3-4+ edema  Neuro: alert & oriented x 3, cranial nerves grossly intact. moves all 4 extremities w/o difficulty. Affect pleasant.   ASSESSMENT & PLAN:  1. Acute on chronic systolic HF with NYHA IV symptoms and marked volume overload due to presumed NICM EF 20%  2. CKD, stage 3  3. Breast CA  4. Multiple myeloma  5. DNR/DNI   She appears to have low output HF with massive volume overload. Will admit. Check co-ox off porta-cth. Proceed with IV diuresis. Suspect she will need inotropic support. Prognosis guarded. Wrap legs.   Shaune Pascal Carollee Nussbaumer,MD  3:24 PM

## 2014-01-30 ENCOUNTER — Encounter (HOSPITAL_COMMUNITY): Payer: Self-pay | Admitting: *Deleted

## 2014-01-30 DIAGNOSIS — I5023 Acute on chronic systolic (congestive) heart failure: Secondary | ICD-10-CM | POA: Diagnosis not present

## 2014-01-30 LAB — BASIC METABOLIC PANEL
BUN: 36 mg/dL — AB (ref 6–23)
CHLORIDE: 100 meq/L (ref 96–112)
CO2: 23 mEq/L (ref 19–32)
Calcium: 10 mg/dL (ref 8.4–10.5)
Creatinine, Ser: 1.33 mg/dL — ABNORMAL HIGH (ref 0.50–1.10)
GFR calc Af Amer: 43 mL/min — ABNORMAL LOW (ref >90)
GFR, EST NON AFRICAN AMERICAN: 37 mL/min — AB (ref >90)
GLUCOSE: 119 mg/dL — AB (ref 70–99)
Potassium: 4.8 mEq/L (ref 3.7–5.3)
Sodium: 141 mEq/L (ref 137–147)

## 2014-01-30 LAB — CARBOXYHEMOGLOBIN
Carboxyhemoglobin: 1.8 % — ABNORMAL HIGH (ref 0.5–1.5)
Methemoglobin: 0.8 % (ref 0.0–1.5)
O2 SAT: 56.8 %
Total hemoglobin: 12.3 g/dL (ref 12.0–16.0)

## 2014-01-30 MED ORDER — ENALAPRIL MALEATE 5 MG PO TABS
5.0000 mg | ORAL_TABLET | Freq: Two times a day (BID) | ORAL | Status: DC
  Administered 2014-01-30: 5 mg via ORAL
  Filled 2014-01-30 (×4): qty 1

## 2014-01-30 MED ORDER — ENOXAPARIN SODIUM 30 MG/0.3ML ~~LOC~~ SOLN
30.0000 mg | SUBCUTANEOUS | Status: DC
  Administered 2014-01-30 – 2014-02-04 (×6): 30 mg via SUBCUTANEOUS
  Filled 2014-01-30 (×7): qty 0.3

## 2014-01-30 NOTE — Progress Notes (Signed)
Advanced Heart Failure Rounding Note  Minimal Code: NO CPR or Intubation  Subjective:    Jacqueline Rivas is a 80 y.o. female with a history of multiple myeloma (diagnosed 11/2012 - treated with XRT/chemo), breast CA, CKD and recent diagnosis of systolic HF.   Apparently her EF was normal in September 2014. She was on Herceptin for breast CA with Dr. Marin Olp for 1 year, starting in June 2014. In February, she had an echo which showed an EF of 20-25%. The Herceptin was discontinued. She was admitted last week to [facility] for volume overload. She was diuresed. Discharged to [facility] on 01/22/14. Weight ~127. Discharged on lasix 44m daily. In hospital diuresis was limited by low BP. Incidental finding of lives mass - ? mets   Seen as a new patient in the HF clinic yesterday. Over the past week has had LE edema, SOB, orthopnea and PND. Admitted from clinic for diuresis.  4/1: LVEF 20% RV mildly dilated. Moderate TR   Objective:   Weight Range:  Vital Signs:   Temp:  [98 F (36.7 C)-98.1 F (36.7 C)] 98 F (36.7 C) (04/14 0340) Pulse Rate:  [76-100] 84 (04/14 0600) Resp:  [16-36] 32 (04/13 2227) BP: (106-124)/(62-97) 109/71 mmHg (04/14 0337) SpO2:  [90 %-100 %] 95 % (04/14 0600) Weight:  [117 lb (53.071 kg)-135 lb (61.236 kg)] 117 lb (53.071 kg) (04/14 0340) Last BM Date:  (pta)  Weight change: Filed Weights   01/29/14 1631 01/30/14 0340  Weight: 133 lb 2.5 oz (60.4 kg) 117 lb (53.071 kg)    Intake/Output:   Intake/Output Summary (Last 24 hours) at 01/30/14 0720 Last data filed at 01/30/14 0600  Gross per 24 hour  Intake     20 ml  Output   2075 ml  Net  -2055 ml     Physical Exam: General: Sitting in chair Frail Mildly dyspneic  HEENT: normal  Neck: supple. JVP to 9-10 with prominent CV waves Carotids 2+ bilat; no bruits. No lymphadenopathy or thryomegaly appreciated.  Cor: PMI laterally displaced. Regular rate & rhythm. + s3. 2/6 TR/MR  Lungs: decreased at bases  Abdomen: soft,  nontender, + distended. No hepatosplenomegaly. No bruits or masses. Good bowel sounds.  Extremities: no cyanosis, clubbing, rash, wrapped 3+ edema  Neuro: alert & oriented x 3, cranial nerves grossly intact. moves all 4 extremities w/o difficulty. Affect pleasant.    Telemetry: SInus   Labs: Basic Metabolic Panel:  Recent Labs Lab 01/29/14 1700 01/30/14 0340  NA 136* 141  K 5.7* 4.8  CL 100 100  CO2 20 23  GLUCOSE 170* 119*  BUN 37* 36*  CREATININE 1.37* 1.33*  CALCIUM 10.1 10.0  MG 2.0  --     Liver Function Tests:  Recent Labs Lab 01/29/14 1700  AST 39*  ALT 59*  ALKPHOS 112  BILITOT 1.0  PROT 7.1  ALBUMIN 3.2*   No results found for this basename: LIPASE, AMYLASE,  in the last 168 hours No results found for this basename: AMMONIA,  in the last 168 hours  CBC:  Recent Labs Lab 01/29/14 1700  WBC 6.1  NEUTROABS 4.4  HGB 12.8  HCT 39.4  MCV 102.3*  PLT 197    Cardiac Enzymes: No results found for this basename: CKTOTAL, CKMB, CKMBINDEX, TROPONINI,  in the last 168 hours  BNP: BNP (last 3 results)  Recent Labs  01/16/14 1615 01/29/14 1700  PROBNP 7214.0* 4550.0*    Imaging:  No results found.   Medications:  Scheduled Medications: . enalapril  2.5 mg Oral BID  . enoxaparin (LOVENOX) injection  40 mg Subcutaneous Q24H  . fentaNYL  12.5 mcg Transdermal Q72H  . furosemide  80 mg Intravenous BID  . potassium chloride  40 mEq Oral Daily  . sodium chloride  3 mL Intravenous Q12H     Infusions:     PRN Medications:  sodium chloride, acetaminophen, HYDROmorphone, ondansetron (ZOFRAN) IV, sodium chloride   Assessment:   1. Acute on chronic systolic HF with NYHA IV symptoms and marked volume overload due to presumed NICM EF 20%  2. CKD, stage 3  3. Breast CA  4. Multiple myeloma  5. DNR/DNI   Plan/Discussion:    Admitted yesterday with volume overload and started on IV lasix 80 mg BID. Volume status much improved this am  24 hr I/O -2 liters. Co-ox 57%. Will continue lasix and will increase enalapril to 5 mg daily. Continue to follow Cr closely.   Legs wrapped with Unna boots. Will consult PT   Length of Stay: [place] 01/30/2014, 7:20 AM  Advanced Heart Failure Team Pager [phone] (M-F; 7a - 4p)  Please contact [facility] for night-coverage after hours (4p -7a ) and weekends on [email or web address]  Patient seen and examined with Junie Bame, NP. We discussed all aspects of the encounter. I agree with the assessment and plan as stated above.   Responding well to IV lasix. Co-ox marginal but acceptable. Legs wrapped. Will continue diuresis. Increase enalapril to 5 bid. No b-blocker yet.   Shaune Pascal Denine Brotz,MD 7:48 AM

## 2014-01-30 NOTE — Consult Note (Signed)
Consult for [employer] nurse for Unna's boot application.  These are actually placed by orthopedic techs.  I have notified the bedside nurse and also paged the orthopedic tech to make them aware of the new orders.    Re consult if needed, will not follow at this time. Thanks  [name], [place] [phone])

## 2014-01-30 NOTE — Progress Notes (Signed)
Orthopedic Tech Progress Note Patient Details:  Jacqueline Rivas [birth year] [id number]  Ortho Devices Type of Ortho Device: Haematologist Ortho Device/Splint Location: bilateral unna wraps. bill wraped left leg , [name] wraped right leg Ortho Device/Splint Interventions: Application   Jacqueline Rivas 01/30/2014, 8:50 AM

## 2014-01-30 NOTE — Evaluation (Signed)
Physical Therapy Evaluation Patient Details Name: Jacqueline Rivas MRN: [id number] DOB: 04-28-1932 Today's Date: 01/30/2014   History of Present Illness  Pt is a 80 year old presenting with SOB, B LE edema, and CHF; pt has a history of breast cancer s/p mastectomy and multiple myeloma  Clinical Impression  Pt is very pleasant in the recliner on arrival and stating she had not yet been in the bed due to anxiety of not being able to breathe. After ambulation assisted pt to bed with legs elevated with sats 94-96% on RA.Pt was at [facility] and not sure if she desires to return there or if family will be able to assist at DC. Pt with decreased cognition, balance and mobility and will benefit from acute therapy to maximize mobility, function and independence as well as continued therapy and supervision at DC.     Follow Up Recommendations SNF;Supervision for mobility/OOB- pt is unsure what supervision family can arrange. Long term pt would probably benefit from ALF    Equipment Recommendations  None recommended by PT    Recommendations for Other Services       Precautions / Restrictions Precautions Precautions: Fall      Mobility  Bed Mobility Overal bed mobility: Needs Assistance Bed Mobility: Sit to Supine       Sit to supine: Supervision;HOB elevated   General bed mobility comments: cues for sequence  Transfers Overall transfer level: Needs assistance   Transfers: Sit to/from Stand Sit to Stand: Supervision         General transfer comment: cues for hand placement for safety and positioning of Rw during transfer  Ambulation/Gait Ambulation/Gait assistance: Supervision Ambulation Distance (Feet): 350 Feet Assistive device: Rolling walker (2 wheeled) Gait Pattern/deviations: Step-through pattern;Decreased stride length   Gait velocity interpretation: at or above normal speed for age/gender General Gait Details: cues for position in RW, posture and breathing technique.  Sats 85-92% on RA with gait, pt unaware of desaturation  Stairs            Wheelchair Mobility    Modified Rankin (Stroke Patients Only)       Balance                                             Pertinent Vitals/Pain No pain HR 87 sats 96% at rest on RA    Home Living Family/patient expects to be discharged to:: Skilled nursing facility Living Arrangements: Alone   Type of Home: Apartment Home Access: Level entry     Home Layout: One level Home Equipment: Walker - 2 wheels;Cane - single point;Tub bench Additional Comments: pt has used a RW recently    Prior Function Level of Independence: Independent with assistive device(s)         Comments: since being at SNF over the last week has received assist for ADLs and previously was independent     Hand Dominance        Extremity/Trunk Assessment   Upper Extremity Assessment: Generalized weakness           Lower Extremity Assessment: Generalized weakness      Cervical / Trunk Assessment: Kyphotic  Communication   Communication: No difficulties  Cognition Arousal/Alertness: Awake/alert Behavior During Therapy: WFL for tasks assessed/performed Overall Cognitive Status: Impaired/Different from baseline Area of Impairment: Memory     Memory: Decreased short-term memory  General Comments: pt became distracted in room trying to decide between brushing her teeth and getting to bed and difficulty recalling where she put things like her cell phone    General Comments      Exercises General Exercises - Lower Extremity Ankle Circles/Pumps: AROM;Both;10 reps;Seated Short Arc Quad: AROM;Seated;Both;10 reps      Assessment/Plan    PT Assessment Patient needs continued PT services  PT Diagnosis Difficulty walking;Generalized weakness   PT Problem List Decreased activity tolerance;Decreased mobility;Decreased knowledge of use of DME;Cardiopulmonary status limiting  activity;Decreased strength  PT Treatment Interventions DME instruction;Gait training;Functional mobility training;Therapeutic activities;Therapeutic exercise;Patient/family education   PT Goals (Current goals can be found in the Care Plan section) Acute Rehab PT Goals Patient Stated Goal: pt states she's not sure where she wants to go from here PT Goal Formulation: With patient Time For Goal Achievement: 02/13/14 Potential to Achieve Goals: Good    Frequency Min 3X/week   Barriers to discharge        Co-evaluation               End of Session   Activity Tolerance: Patient tolerated treatment well Patient left: in bed;with call bell/phone within reach           Time: 1321-1350 PT Time Calculation (min): 29 min   Charges:   PT Evaluation $Initial PT Evaluation Tier I: 1 Procedure PT Treatments $Gait Training: 8-22 mins $Therapeutic Activity: 8-22 mins   PT G Codes:          Emonte Dieujuste B Anamari Galeas 01/30/2014, 2:01 PM Elwyn Reach, [place]

## 2014-01-30 NOTE — Progress Notes (Signed)
Foam pads applied to mid- back bony prominences.

## 2014-01-31 DIAGNOSIS — I5023 Acute on chronic systolic (congestive) heart failure: Secondary | ICD-10-CM | POA: Diagnosis not present

## 2014-01-31 LAB — BASIC METABOLIC PANEL
BUN: 37 mg/dL — ABNORMAL HIGH (ref 6–23)
CO2: 28 meq/L (ref 19–32)
Calcium: 9.6 mg/dL (ref 8.4–10.5)
Chloride: 99 mEq/L (ref 96–112)
Creatinine, Ser: 1.66 mg/dL — ABNORMAL HIGH (ref 0.50–1.10)
GFR calc Af Amer: 33 mL/min — ABNORMAL LOW (ref >90)
GFR, EST NON AFRICAN AMERICAN: 28 mL/min — AB (ref >90)
Glucose, Bld: 93 mg/dL (ref 70–99)
Potassium: 4.2 mEq/L (ref 3.7–5.3)
SODIUM: 142 meq/L (ref 137–147)

## 2014-01-31 LAB — CARBOXYHEMOGLOBIN
CARBOXYHEMOGLOBIN: 1.4 % (ref 0.5–1.5)
Methemoglobin: 0.7 % (ref 0.0–1.5)
O2 Saturation: 52.5 %
Total hemoglobin: 11.8 g/dL — ABNORMAL LOW (ref 12.0–16.0)

## 2014-01-31 MED ORDER — SODIUM CHLORIDE 0.9 % IV BOLUS (SEPSIS)
250.0000 mL | Freq: Once | INTRAVENOUS | Status: AC
  Administered 2014-01-31: 250 mL via INTRAVENOUS

## 2014-01-31 MED ORDER — MILRINONE IN DEXTROSE 20 MG/100ML IV SOLN
0.2500 ug/kg/min | INTRAVENOUS | Status: DC
  Administered 2014-01-31: 0.25 ug/kg/min via INTRAVENOUS
  Filled 2014-01-31: qty 100

## 2014-01-31 MED ORDER — ENALAPRIL MALEATE 2.5 MG PO TABS
2.5000 mg | ORAL_TABLET | Freq: Two times a day (BID) | ORAL | Status: DC
Start: 2014-01-31 — End: 2014-01-31
  Filled 2014-01-31 (×2): qty 1

## 2014-01-31 NOTE — Progress Notes (Signed)
Paged Dr. Marigene Ehlers at this time re; low bp, low urine output.  BP 78/42.  Pt slightly drowsy but responsive.  Urine output over last 3hr is 67ml.

## 2014-01-31 NOTE — Progress Notes (Addendum)
Advanced Heart Failure Rounding Note  Minimal Code: NO CPR or Intubation  Subjective:    Jacqueline Rivas is a 80 y.o. female with a history of multiple myeloma (diagnosed 11/2012 - treated with XRT/chemo), breast CA, CKD and recent diagnosis of systolic HF.   Apparently her EF was normal in September 2014. She was on Herceptin for breast CA with Dr. Marin Olp for 1 year, starting in June 2014. In February, she had an echo which showed an EF of 20-25%. The Herceptin was discontinued. She was admitted last week to [facility] for volume overload. She was diuresed. Discharged to [facility] on 01/22/14. Weight ~127. Discharged on lasix 50m daily. In hospital diuresis was limited by low BP. Incidental finding of lives mass - ? mets   Seen as a new patient in the HF clinic 4/13. Over the past week has had LE edema, SOB, orthopnea and PND. Admitted from clinic for diuresis. Weight down 8 lbs, 24 hr I/O -1.7 liters. Co-ox 52.5% and Cr up 1.66. Legs wrapped with unna boots. Denies SOB or CP.   4/1: LVEF 20% RV mildly dilated. Moderate TR   Objective:   Weight Range:  Vital Signs:   Temp:  [97.4 F (36.3 C)-98.2 F (36.8 C)] 98.1 F (36.7 C) (04/15 0317) Pulse Rate:  [70-88] 74 (04/14 1558) Resp:  [14-16] 14 (04/15 0317) BP: (72-106)/(27-70) 88/51 mmHg (04/15 0317) SpO2:  [91 %-97 %] 94 % (04/15 0317) Weight:  [125 lb 14.1 oz (57.1 kg)] 125 lb 14.1 oz (57.1 kg) (04/15 0500) Last BM Date: 01/28/14  Weight change: Filed Weights   01/29/14 1631 01/30/14 0340 01/31/14 0500  Weight: 133 lb 2.5 oz (60.4 kg) 117 lb (53.071 kg) 125 lb 14.1 oz (57.1 kg)    Intake/Output:   Intake/Output Summary (Last 24 hours) at 01/31/14 0740 Last data filed at 01/31/14 0600  Gross per 24 hour  Intake    823 ml  Output   2560 ml  Net  -1737 ml     Physical Exam: General: Sitting in chair Frail Mildly dyspneic  HEENT: normal  Neck: supple. JVP to ?8, Carotids 2+ bilat; no bruits. No lymphadenopathy or thryomegaly  appreciated.  Cor: PMI laterally displaced. Regular rate & rhythm. + s3. 2/6 TR/MR  Lungs: decreased at bases  Abdomen: soft, nontender, + distended. No hepatosplenomegaly. No bruits or masses. Good bowel sounds.  Extremities: no cyanosis, clubbing, rash, wrapped 3+ edema  Neuro: alert & oriented x 3, cranial nerves grossly intact. moves all 4 extremities w/o difficulty. Affect pleasant.    Telemetry: SInus   Labs: Basic Metabolic Panel:  Recent Labs Lab 01/29/14 1700 01/30/14 0340 01/31/14 0500  NA 136* 141 142  K 5.7* 4.8 4.2  CL 100 100 99  CO2 20 23 28   GLUCOSE 170* 119* 93  BUN 37* 36* 37*  CREATININE 1.37* 1.33* 1.66*  CALCIUM 10.1 10.0 9.6  MG 2.0  --   --     Liver Function Tests:  Recent Labs Lab 01/29/14 1700  AST 39*  ALT 59*  ALKPHOS 112  BILITOT 1.0  PROT 7.1  ALBUMIN 3.2*   No results found for this basename: LIPASE, AMYLASE,  in the last 168 hours No results found for this basename: AMMONIA,  in the last 168 hours  CBC:  Recent Labs Lab 01/29/14 1700  WBC 6.1  NEUTROABS 4.4  HGB 12.8  HCT 39.4  MCV 102.3*  PLT 197    Cardiac Enzymes: No results found for this basename:  CKTOTAL, CKMB, CKMBINDEX, TROPONINI,  in the last 168 hours  BNP: BNP (last 3 results)  Recent Labs  01/16/14 1615 01/29/14 1700  PROBNP 7214.0* 4550.0*    Imaging: No results found.   Medications:     Scheduled Medications: . enalapril  5 mg Oral BID  . enoxaparin (LOVENOX) injection  30 mg Subcutaneous Q24H  . fentaNYL  12.5 mcg Transdermal Q72H  . furosemide  80 mg Intravenous BID  . potassium chloride  40 mEq Oral Daily  . sodium chloride  3 mL Intravenous Q12H    Infusions:    PRN Medications: sodium chloride, acetaminophen, HYDROmorphone, ondansetron (ZOFRAN) IV, sodium chloride   Assessment:   1. Acute on chronic systolic HF with NYHA IV symptoms and marked volume overload due to presumed NICM EF 20%  2. CKD, stage 3  3. Breast CA   4. Multiple myeloma  5. DNR/DNI   Plan/Discussion:    Admitted 4/13 with volume overload and started on IV lasix 80 mg BID. Down a total of 8 lbs, however still has volume on board. Co-ox down to 52.5% and Cr increasing. Will start short dose of milrinone 0.25 to help facilitate diuresis. Follow telemetry closely.   SBP 80-90s. Not on BB with acute decompensation and volume overload. Will cut enalapril 2.5 mg BID.  May need to have [place] before discharge to assess hemodynamics. Will check with nursing staff to see if we can draw CVPs off portacath.   Legs wrapped with Unna boots. Will consult PT   Length of Stay: [place] NP-C 01/31/2014, 7:40 AM  Advanced Heart Failure Team Pager [phone] (M-F; 7a - 4p)  Please contact [facility] for night-coverage after hours (4p -7a ) and weekends on [email or web address]  Patient seen with NP, agree with the above note.  Co-ox low today, SBP 80s-90s.  She may still be volume overloaded but exam difficult. Creatinine is up today.  Suspect NICM from Herceptin but CAD not ruled out. Narrow QRS.   Will add milrinone today for low output with rising creatinine.  Decrease enalapril to 2.5 bid.  Continue IV Lasix at same dose, reasonable urine output yesterday.  Will follow CVP off portacath to help with volume estimation.   Would like to do LHC/RHC prior to discharge, follow creatinine.   Larey Dresser 01/31/2014 8:18 AM

## 2014-01-31 NOTE — Progress Notes (Signed)
After getting 250 ml bolus, pt's bp remains 74/39.  Pt remains asymptomatic.  Notified Junie Bame NP at this time who ordered another 250 ml bolus, which was started at this time.

## 2014-01-31 NOTE — Progress Notes (Addendum)
Came by to check on patient. CVP 3-4 and SBP running 70-80s. Lisinopril already stopped. Will stop IV lasix and stop milrinone for now. Give 250 cc bolus and told to call if SBP not improving. Patient asymptomatic. Net negative 230 cc today.   Rande Brunt NP-C 1:51 PM  CVP low.  Stopped ACEI and Lasix.  Co-ox may have been low due to overdiuresis.  Stopped milrinone.  Gave 250 cc bolus. SBP after bolus up to 100s-120.   Larey Dresser 01/31/2014

## 2014-01-31 NOTE — Progress Notes (Signed)
Notified Blima Ledger, NP of pt's current BP and urine output who states, "will hold enalapril for now and I will come up to see pt shortly"

## 2014-02-01 DIAGNOSIS — I5043 Acute on chronic combined systolic (congestive) and diastolic (congestive) heart failure: Secondary | ICD-10-CM

## 2014-02-01 LAB — BASIC METABOLIC PANEL
BUN: 34 mg/dL — AB (ref 6–23)
CO2: 25 mEq/L (ref 19–32)
Calcium: 9.6 mg/dL (ref 8.4–10.5)
Chloride: 103 mEq/L (ref 96–112)
Creatinine, Ser: 1.52 mg/dL — ABNORMAL HIGH (ref 0.50–1.10)
GFR calc Af Amer: 36 mL/min — ABNORMAL LOW (ref >90)
GFR, EST NON AFRICAN AMERICAN: 31 mL/min — AB (ref >90)
Glucose, Bld: 103 mg/dL — ABNORMAL HIGH (ref 70–99)
Potassium: 4.3 mEq/L (ref 3.7–5.3)
Sodium: 142 mEq/L (ref 137–147)

## 2014-02-01 LAB — CARBOXYHEMOGLOBIN
Carboxyhemoglobin: 1.5 % (ref 0.5–1.5)
Methemoglobin: 0.8 % (ref 0.0–1.5)
O2 Saturation: 63 %
Total hemoglobin: 11.6 g/dL — ABNORMAL LOW (ref 12.0–16.0)

## 2014-02-01 MED ORDER — SPIRONOLACTONE 12.5 MG HALF TABLET
12.5000 mg | ORAL_TABLET | Freq: Every day | ORAL | Status: DC
  Administered 2014-02-01 – 2014-02-04 (×4): 12.5 mg via ORAL
  Filled 2014-02-01 (×5): qty 1

## 2014-02-01 MED ORDER — TORSEMIDE 20 MG PO TABS
20.0000 mg | ORAL_TABLET | Freq: Every day | ORAL | Status: DC
  Administered 2014-02-01: 20 mg via ORAL
  Filled 2014-02-01 (×2): qty 1

## 2014-02-01 NOTE — Progress Notes (Signed)
Refused foley cath to be removed for now, explained the risk of bladder infection and understood, claimed to have it be taken out tom.

## 2014-02-01 NOTE — Progress Notes (Signed)
For the second time around, offered to d/c foley pt . Claimed to have it done tonight. Explained to her about the risk of bladder infection and still insisted to have it removed tonight.

## 2014-02-01 NOTE — Progress Notes (Signed)
Old duragesic patch removed from left arm and wasted in the sharp box as witnessed by RN.

## 2014-02-01 NOTE — Progress Notes (Signed)
Physical Therapy Treatment Patient Details Name: Jacqueline Rivas MRN: [id number] DOB: Jul 02, 1932 Today's Date: 02/01/2014    History of Present Illness Pt is a 80 year old presenting with SOB, B LE edema, and CHF; pt has a history of breast cancer s/p mastectomy and multiple myeloma    PT Comments    Pt progressing towards physical therapy goals. Reports feeling safer with walker use, however balance appears to be improving overall. Frequent cueing required for pursed-lip breathing, as pt was on RA throughout session. At rest, Pt at 90%, decreasing to 88% with ambulation, and able to be improved to 92% with PLB. Will continue to follow.   Follow Up Recommendations  SNF;Supervision for mobility/OOB - pt is unsure what supervision family can arrange. Long term pt would probably benefit from ALF.     Equipment Recommendations  None recommended by PT    Recommendations for Other Services       Precautions / Restrictions Precautions Precautions: Fall Restrictions Weight Bearing Restrictions: No    Mobility  Bed Mobility               General bed mobility comments: Pt sitting up in recliner when PT enters.  Transfers Overall transfer level: Needs assistance Equipment used: Rolling walker (2 wheeled) Transfers: Sit to/from Stand Sit to Stand: Supervision         General transfer comment: Cues for safety awareness with the RW. Proper hand placement demonstrated.  Ambulation/Gait Ambulation/Gait assistance: Min guard;Supervision Ambulation Distance (Feet): 225 Feet Assistive device: Rolling walker (2 wheeled) Gait Pattern/deviations: Step-through pattern;Decreased stride length;Trunk flexed Gait velocity: Decreased Gait velocity interpretation: Below normal speed for age/gender General Gait Details: VC's for safety awareness with the RW. Pt ambulating on RA and O2 sats dropped to 88%, able to be improved to 92% with pursed-lip breathing.    Stairs             Wheelchair Mobility    Modified Rankin (Stroke Patients Only)       Balance Overall balance assessment: Needs assistance Sitting-balance support: Feet supported;No upper extremity supported Sitting balance-Leahy Scale: Good     Standing balance support: No upper extremity supported Standing balance-Leahy Scale: Fair                      Cognition Arousal/Alertness: Awake/alert Behavior During Therapy: WFL for tasks assessed/performed Overall Cognitive Status: Within Functional Limits for tasks assessed                      Exercises General Exercises - Lower Extremity Long Arc Quad: 10 reps Hip ABduction/ADduction: 10 reps;Strengthening (Both abd and add) Hip Flexion/Marching: 10 reps;Both Toe Raises: 10 reps Heel Raises: 10 reps    General Comments        Pertinent Vitals/Pain No pain reported. See PT comments for O2 saturations.     Home Living                      Prior Function            PT Goals (current goals can now be found in the care plan section) Acute Rehab PT Goals Patient Stated Goal: To return home.  PT Goal Formulation: With patient Time For Goal Achievement: 02/13/14 Potential to Achieve Goals: Good Progress towards PT goals: Progressing toward goals    Frequency  Min 3X/week    PT Plan Current plan remains appropriate    Co-evaluation  End of Session Equipment Utilized During Treatment: Gait belt Activity Tolerance: Patient tolerated treatment well Patient left: in chair;with call bell/phone within reach     Time: 1039-1104 PT Time Calculation (min): 25 min  Charges:  $Gait Training: 8-22 mins $Therapeutic Exercise: 8-22 mins                    G Codes:      Jolyn Lent 2014/02/16, 11:51 AM  Jolyn Lent, PT, DPT Acute Rehabilitation Services Pager: [phone]

## 2014-02-01 NOTE — Progress Notes (Signed)
Advanced Heart Failure Rounding Note  Minimal Code: NO CPR or Intubation  Subjective:    Jacqueline Rivas is a 80 y.o. female with a history of multiple myeloma (diagnosed 11/2012 - treated with XRT/chemo), breast CA, CKD and recent diagnosis of systolic HF.   Apparently her EF was normal in September 2014. She was on Herceptin for breast CA with Dr. Marin Olp for 1 year, starting in June 2014. In February, she had an echo which showed an EF of 20-25%. The Herceptin was discontinued. She was admitted last week to [facility] for volume overload. She was diuresed. Discharged to [facility] on 01/22/14. Weight ~127. Discharged on lasix 26m daily. In hospital diuresis was limited by low BP. Incidental finding of lives mass - ? mets   Seen as a new patient in the HF clinic 4/13. Over the past week has had LE edema, SOB, orthopnea and PND. Admitted from clinic for diuresis. Weight down 1 lb, 24 hr I/O -630 cc. Yesterday started milrinone 0.25 and SBP dropped to 60-80s. Patient was asymptomatic. Stopped enalapril. Patient remained hypotensive, but asymptomatic and CVP 3-4. Stopped lasix and milrinone and she received 500 cc bolus. Legs wrapped with unna boots. Denies SOB or CP. Cr 1.52. Co-ox 63%  4/1: LVEF 20% RV mildly dilated. Moderate TR   Objective:   Weight Range:  Vital Signs:   Temp:  [97.6 F (36.4 C)-98.4 F (36.9 C)] 98.1 F (36.7 C) (04/16 0336) Pulse Rate:  [67-104] 104 (04/16 0336) Resp:  [14-16] 15 (04/16 0336) BP: (74-131)/(30-76) 131/76 mmHg (04/16 0445) SpO2:  [92 %-96 %] 92 % (04/16 0336) Weight:  [126 lb 6.4 oz (57.335 kg)] 126 lb 6.4 oz (57.335 kg) (04/16 0336) Last BM Date: 01/31/14  Weight change: Filed Weights   01/30/14 0340 01/31/14 0500 02/01/14 0336  Weight: 117 lb (53.071 kg) 125 lb 14.1 oz (57.1 kg) 126 lb 6.4 oz (57.335 kg)    Intake/Output:   Intake/Output Summary (Last 24 hours) at 02/01/14 0701 Last data filed at 02/01/14 0345  Gross per 24 hour  Intake 752.79 ml   Output   1385 ml  Net -632.21 ml     Physical Exam: General: Sitting in chair Frail, NAD  HEENT: normal  Neck: supple. JVP 7-8, Carotids 2+ bilat; no bruits. No lymphadenopathy or thryomegaly appreciated.  Cor: PMI laterally displaced. Regular rate & rhythm. + s3. 2/6 TR/MR  Lungs: decreased at bases  Abdomen: soft, nontender, + distended. No hepatosplenomegaly. No bruits or masses. Good bowel sounds.  Extremities: no cyanosis, clubbing, rash, wrapped 3+ edema  Neuro: alert & oriented x 3, cranial nerves grossly intact. moves all 4 extremities w/o difficulty. Affect pleasant.    Telemetry: SInus   Labs: Basic Metabolic Panel:  Recent Labs Lab 01/29/14 1700 01/30/14 0340 01/31/14 0500 02/01/14 0500  NA 136* 141 142 142  K 5.7* 4.8 4.2 4.3  CL 100 100 99 103  CO2 20 23 28 25   GLUCOSE 170* 119* 93 103*  BUN 37* 36* 37* 34*  CREATININE 1.37* 1.33* 1.66* 1.52*  CALCIUM 10.1 10.0 9.6 9.6  MG 2.0  --   --   --     Liver Function Tests:  Recent Labs Lab 01/29/14 1700  AST 39*  ALT 59*  ALKPHOS 112  BILITOT 1.0  PROT 7.1  ALBUMIN 3.2*   No results found for this basename: LIPASE, AMYLASE,  in the last 168 hours No results found for this basename: AMMONIA,  in the last 168 hours  CBC:  Recent Labs Lab 01/29/14 1700  WBC 6.1  NEUTROABS 4.4  HGB 12.8  HCT 39.4  MCV 102.3*  PLT 197    Cardiac Enzymes: No results found for this basename: CKTOTAL, CKMB, CKMBINDEX, TROPONINI,  in the last 168 hours  BNP: BNP (last 3 results)  Recent Labs  01/16/14 1615 01/29/14 1700  PROBNP 7214.0* 4550.0*    Imaging: No results found.   Medications:     Scheduled Medications: . enoxaparin (LOVENOX) injection  30 mg Subcutaneous Q24H  . fentaNYL  12.5 mcg Transdermal Q72H  . potassium chloride  40 mEq Oral Daily  . sodium chloride  3 mL Intravenous Q12H    Infusions:    PRN Medications: sodium chloride, acetaminophen, HYDROmorphone, ondansetron  (ZOFRAN) IV, sodium chloride   Assessment:   1. Acute on chronic systolic HF with NYHA IV symptoms and marked volume overload due to presumed NICM EF 20%  2. CKD, stage 3  3. Breast CA  4. Multiple myeloma  5. DNR/DNI   Plan/Discussion:    Admitted 4/13 with volume overload and started on IV lasix 80 mg BID. Stable overnight. Became hypotensive yesterday and CVP 3-4, lasix, enalapril and milrinone stopped and received 500 cc bolus.   Stable overnight. SBP 100-130s. Weight down 1 lb, 24 hr I/O - 630 cc. Volume status improving and she and LE edema better. CVP off portacath 10, not sure if accurate. Will try to start PO diuretics today torsemide 20 mg daily. Will start spiro 12.5 mg daily. Cr slightly improved, will continue to follow.  If Cr continues to improve and remains stable will hold off on RHC. If Cr starts trending up will take her to cath lab for RHC to assess hemodynamics. Will hold off on any HF medications currently.   Continue PT.  Length of Stay: [place] NP-C 02/01/2014, 7:01 AM  Advanced Heart Failure Team Pager [phone] (M-F; 7a - 4p)  Please contact [facility] for night-coverage after hours (4p -7a ) and weekends on [email or web address]  Patient seen and examined with Junie Bame, NP. We discussed all aspects of the encounter. I agree with the assessment and plan as stated above. Events of yesterday noted. Now off milrinone. Co-ox ok. Creatinine slightly improved. Breathing better. Will try to establish stable oral regimen. Start with demadex 20 daily and spiro 12.5. Hopefully can add back ACE soon. No b-blocker yet. If not working may need RHC. Discussed with patient and son. Continue PT   Shaune Pascal Carvell Hoeffner,MD 9:56 AM

## 2014-02-02 DIAGNOSIS — I5023 Acute on chronic systolic (congestive) heart failure: Secondary | ICD-10-CM | POA: Diagnosis not present

## 2014-02-02 LAB — BASIC METABOLIC PANEL
BUN: 31 mg/dL — ABNORMAL HIGH (ref 6–23)
CHLORIDE: 100 meq/L (ref 96–112)
CO2: 27 mEq/L (ref 19–32)
Calcium: 9.9 mg/dL (ref 8.4–10.5)
Creatinine, Ser: 1.33 mg/dL — ABNORMAL HIGH (ref 0.50–1.10)
GFR calc non Af Amer: 37 mL/min — ABNORMAL LOW (ref >90)
GFR, EST AFRICAN AMERICAN: 43 mL/min — AB (ref >90)
Glucose, Bld: 102 mg/dL — ABNORMAL HIGH (ref 70–99)
Potassium: 4.3 mEq/L (ref 3.7–5.3)
Sodium: 140 mEq/L (ref 137–147)

## 2014-02-02 LAB — CARBOXYHEMOGLOBIN
CARBOXYHEMOGLOBIN: 1.5 % (ref 0.5–1.5)
METHEMOGLOBIN: 0.7 % (ref 0.0–1.5)
O2 Saturation: 65.6 %
Total hemoglobin: 10.1 g/dL — ABNORMAL LOW (ref 12.0–16.0)

## 2014-02-02 MED ORDER — LISINOPRIL 2.5 MG PO TABS
2.5000 mg | ORAL_TABLET | Freq: Every day | ORAL | Status: DC
  Administered 2014-02-02 – 2014-02-03 (×2): 2.5 mg via ORAL
  Filled 2014-02-02 (×2): qty 1

## 2014-02-02 MED ORDER — TORSEMIDE 20 MG PO TABS
40.0000 mg | ORAL_TABLET | Freq: Every day | ORAL | Status: DC
  Administered 2014-02-02 – 2014-02-03 (×2): 40 mg via ORAL
  Filled 2014-02-02 (×3): qty 2

## 2014-02-02 NOTE — Progress Notes (Addendum)
Advanced Heart Failure Rounding Note  Minimal Code: NO CPR or Intubation  Subjective:    Ranessa is a 80 y.o. female with a history of multiple myeloma (diagnosed 11/2012 - treated with XRT/chemo), breast CA, CKD and recent diagnosis of systolic HF.   Apparently her EF was normal in September 2014. She was on Herceptin for breast CA with Dr. Ennever for 1 year, starting in June 2014. In February, she had an echo which showed an EF of 20-25%. The Herceptin was discontinued. She was admitted last week to [facility] for volume overload. She was diuresed. Discharged to [facility] on 01/22/14. Weight ~127. Discharged on lasix 20mg daily. In hospital diuresis was limited by low BP. Incidental finding of lives mass - ? mets   Seen as a new patient in the HF clinic 4/13. Over the past week has had LE edema, SOB, orthopnea and PND. Admitted from clinic for diuresis.   Off milrinone. Co-ox good 65% Switched to po demadex yesterday. I/Os essentially even. Weight up a pound. No BMET this am. Foley out this am. Feels much better though she didn't sleep well. Denies dyspnea. CVP 9  4/1: LVEF 20% RV mildly dilated. Moderate TR   Objective:   Weight Range:  Vital Signs:   Temp:  [97.5 F (36.4 C)-98.6 F (37 C)] 98.3 F (36.8 C) (04/17 0400) Pulse Rate:  [84-100] 84 (04/17 0400) Resp:  [18-22] 20 (04/17 0400) BP: (93-123)/(44-74) 123/74 mmHg (04/17 0400) SpO2:  [94 %-99 %] 94 % (04/17 0400) Weight:  [57.8 kg (127 lb 6.8 oz)] 57.8 kg (127 lb 6.8 oz) (04/17 0500) Last BM Date: 01/31/14  Weight change: Filed Weights   01/31/14 0500 02/01/14 0336 02/02/14 0500  Weight: 57.1 kg (125 lb 14.1 oz) 57.335 kg (126 lb 6.4 oz) 57.8 kg (127 lb 6.8 oz)    Intake/Output:   Intake/Output Summary (Last 24 hours) at 02/02/14 0756 Last data filed at 02/02/14 0530  Gross per 24 hour  Intake   1140 ml  Output   1350 ml  Net   -210 ml     Physical Exam: General: Sitting in chair Frail, NAD  HEENT: normal  Neck:  supple. JVP 7-8, Carotids 2+ bilat; no bruits. No lymphadenopathy or thryomegaly appreciated.  Cor: PMI laterally displaced. Regular rate & rhythm. + s3. 2/6 TR/MR  Lungs: decreased at bases  Abdomen: soft, nontender, + distended. No hepatosplenomegaly. No bruits or masses. Good bowel sounds.  Extremities: no cyanosis, clubbing, rash, wrapped 3+ edema  Neuro: alert & oriented x 3, cranial nerves grossly intact. moves all 4 extremities w/o difficulty. Affect pleasant.    Telemetry: SInus   Labs: Basic Metabolic Panel:  Recent Labs Lab 01/29/14 1700 01/30/14 0340 01/31/14 0500 02/01/14 0500  NA 136* 141 142 142  K 5.7* 4.8 4.2 4.3  CL 100 100 99 103  CO2 20 23 28 25  GLUCOSE 170* 119* 93 103*  BUN 37* 36* 37* 34*  CREATININE 1.37* 1.33* 1.66* 1.52*  CALCIUM 10.1 10.0 9.6 9.6  MG 2.0  --   --   --     Liver Function Tests:  Recent Labs Lab 01/29/14 1700  AST 39*  ALT 59*  ALKPHOS 112  BILITOT 1.0  PROT 7.1  ALBUMIN 3.2*   No results found for this basename: LIPASE, AMYLASE,  in the last 168 hours No results found for this basename: AMMONIA,  in the last 168 hours  CBC:  Recent Labs Lab 01/29/14 1700  WBC   6.1  NEUTROABS 4.4  HGB 12.8  HCT 39.4  MCV 102.3*  PLT 197    Cardiac Enzymes: No results found for this basename: CKTOTAL, CKMB, CKMBINDEX, TROPONINI,  in the last 168 hours  BNP: BNP (last 3 results)  Recent Labs  01/16/14 1615 01/29/14 1700  PROBNP 7214.0* 4550.0*    Imaging: No results found.   Medications:     Scheduled Medications: . enoxaparin (LOVENOX) injection  30 mg Subcutaneous Q24H  . fentaNYL  12.5 mcg Transdermal Q72H  . sodium chloride  3 mL Intravenous Q12H  . spironolactone  12.5 mg Oral Daily  . torsemide  20 mg Oral Daily    Infusions:    PRN Medications: sodium chloride, acetaminophen, HYDROmorphone, ondansetron (ZOFRAN) IV, sodium chloride   Assessment:   1. Acute on chronic systolic HF with NYHA IV  symptoms and marked volume overload due to presumed NICM EF 20%  2. CKD, stage 3  3. Breast CA  4. Multiple myeloma  5. DNR/DNI   Plan/Discussion:    Co-ox much improved. Not diuresing well on low-dose oral demadex. Will increase to 40 daily. Await BMET. Add low dose lisinopril 2.5 as BP tolerates. No b-blocker yet. Change to TED hose.   CR consult for education  and ambulation  Over weekend. Continue to diurese as needed and titrate HF meds as tolerated. Likely to [facility] on Monday with HF f/u in 1 week.    Daniel R Bensimhon,MD 7:56 AM   

## 2014-02-02 NOTE — Progress Notes (Signed)
Clinical Social Work Department BRIEF PSYCHOSOCIAL ASSESSMENT 02/02/2014  Patient:  Jacqueline Rivas, Jacqueline Rivas     Account Number:  [id number]     [place] date:  02/15/2014  Clinical Social Worker:  Freeman Caldron  Date/Time:  02/02/2014 12:02 PM  Referred by:  Physician  Date Referred:  02/02/2014 Referred for  SNF Placement   Other Referral:   Interview type:  Patient Other interview type:    PSYCHOSOCIAL DATA Living Status:  FACILITY Admitted from facility:  [facility] Level of care:  [place] Primary support name:  Teva Bronkema ([phone]) Primary support relationship to patient:  CHILD, ADULT Degree of support available:   Good--pt was at [facility] and [facility] for short-term rehab prior to this hospitalization; lives alone in apartment. Has support from 3 adult children living in [place].    CURRENT CONCERNS Current Concerns  Post-Acute Placement   Other Concerns:    SOCIAL WORK ASSESSMENT / PLAN CSW spoke with pt, who understands need for rehab. Pt states she was at [facility], was discharged to [facility], and has been admitted to [facility]. Pt states she had fluid retention and the SNF sent her to [facility] to manage this. Pt active with the heart clinic, and MD recommending [facility] if they can take her. Sent referral, and facility able to accept her. MD note states pt likely ready for discharge Monday--informed [facility]. Informed pt, who states her son is driving to [facility] Monday to take her to SNF. Provided son's phone number to [facility] to contact him to answer questions/arrange time to complete paperwork; pt informed of this as well.   Assessment/plan status:  Psychosocial Support/Ongoing Assessment of Needs Other assessment/ plan:   Information/referral to community resources:   [employer]. FL2 placed on pt's chart for MD signature.    PATIENT'S/FAMILY'S RESPONSE TO PLAN OF CARE: Good--pt engaged in conversation with  CSW, agrees with recommendation for SNF and states she is happy to hear [facility] can offer a bed. CSW informed facility discharge likely Monday. Will follow up with pt/facility and will facilitate discharge when pt is medically ready.      Ky Barban, MSW, [facility] Clinical Social Worker [phone]

## 2014-02-02 NOTE — Progress Notes (Signed)
Clinical Social Work Department CLINICAL SOCIAL WORK PLACEMENT NOTE 02/02/2014  Patient:  Jacqueline Rivas, Jacqueline Rivas  Account Number:  [id number] [place] date:  02/15/2014  Clinical Social Worker:  Ky Barban, Latanya Presser  Date/time:  02/02/2014 12:09 PM  Clinical Social Work is seeking post-discharge placement for this patient at the following level of care:   [place]   (*CSW will update this form in Epic as items are completed)   02/02/2014  Patient/family provided with [place] Department of Clinical Social Work's list of facilities offering this level of care within the geographic area requested by the patient (or if unable, by the patient's family).  02/02/2014  Patient/family informed of their freedom to choose among providers that offer the needed level of care, that participate in Medicare, Medicaid or managed care program needed by the patient, have an available bed and are willing to accept the patient.  02/02/2014  Patient/family informed of [facility]' ownership interest in [facility], as well as of the fact that they are under no obligation to receive care at this facility.  PASARR submitted to EDS on EXISTING PASARR number received from EDS on EXISTING  FL2 transmitted to all facilities in geographic area requested by pt/family on  02/02/2014 FL2 transmitted to all facilities within larger geographic area on   Patient informed that his/her managed care company has contracts with or will negotiate with  certain facilities, including the following:     Patient/family informed of bed offers received:  02/02/2014 Patient chooses bed at [facility] Physician recommends and patient chooses bed at    Patient to be transferred to [facility] on   Patient to be transferred to facility by family vehicle  The following physician request were entered in Epic:   Additional Comments: Pt and MD seeking placement at [facility]-facility able to offer a bed, and CSW informed  facility pt likely ready for discharge Monday, per MD note. Pt informed of bed offer, and pt's son phone number provided to facility to answer his questions/arrange time to complete paperwork. Pt informed of this as well. Pt to go to facility by family vehicle. CSW to follow up Monday to facilitate discharge if pt is deemed medically stable for transition.   Ky Barban, MSW, [facility] Clinical Social Worker [phone]

## 2014-02-02 NOTE — Progress Notes (Addendum)
CARDIAC REHAB PHASE I   PRE:  Rate/Rhythm: 92 SR    BP: sitting 147/90 (?error)    SaO2: 91 RA  MODE:  Ambulation: 430 ft   POST:  Rate/Rhythm: 104 ST    BP: sitting 94/55     SaO2: 91 RA  Pt fairly steady with RW. Needs reminders at times to step into RW, pt sts "oh yes, they were telling me to do that". C/o lightheadedness "slightly" when asked if she had any complaints. Only c/o SOB once she was lying down after the walk. BP 94/55 after walking. SaO2 low normal. Will f/u. Left HF booklet. Will discuss later. [place], ACSM 02/02/2014 2:36 PM

## 2014-02-03 DIAGNOSIS — C50919 Malignant neoplasm of unspecified site of unspecified female breast: Secondary | ICD-10-CM

## 2014-02-03 DIAGNOSIS — C773 Secondary and unspecified malignant neoplasm of axilla and upper limb lymph nodes: Secondary | ICD-10-CM

## 2014-02-03 DIAGNOSIS — I5023 Acute on chronic systolic (congestive) heart failure: Secondary | ICD-10-CM | POA: Diagnosis not present

## 2014-02-03 LAB — CARBOXYHEMOGLOBIN
CARBOXYHEMOGLOBIN: 1.8 % — AB (ref 0.5–1.5)
Methemoglobin: 0.7 % (ref 0.0–1.5)
O2 Saturation: 56.7 %
Total hemoglobin: 11.9 g/dL — ABNORMAL LOW (ref 12.0–16.0)

## 2014-02-03 LAB — BASIC METABOLIC PANEL
BUN: 29 mg/dL — AB (ref 6–23)
CO2: 28 mEq/L (ref 19–32)
Calcium: 9.9 mg/dL (ref 8.4–10.5)
Chloride: 100 mEq/L (ref 96–112)
Creatinine, Ser: 1.3 mg/dL — ABNORMAL HIGH (ref 0.50–1.10)
GFR calc Af Amer: 44 mL/min — ABNORMAL LOW (ref >90)
GFR, EST NON AFRICAN AMERICAN: 38 mL/min — AB (ref >90)
Glucose, Bld: 108 mg/dL — ABNORMAL HIGH (ref 70–99)
Potassium: 4 mEq/L (ref 3.7–5.3)
SODIUM: 140 meq/L (ref 137–147)

## 2014-02-03 MED ORDER — DIGOXIN 125 MCG PO TABS
0.1250 mg | ORAL_TABLET | Freq: Once | ORAL | Status: AC
  Administered 2014-02-03: 0.125 mg via ORAL
  Filled 2014-02-03: qty 1

## 2014-02-03 MED ORDER — DIGOXIN 0.0625 MG HALF TABLET
0.0625 mg | ORAL_TABLET | Freq: Every day | ORAL | Status: DC
  Administered 2014-02-04 – 2014-02-05 (×2): 0.0625 mg via ORAL
  Filled 2014-02-03 (×2): qty 1

## 2014-02-03 MED ORDER — MAGNESIUM HYDROXIDE 400 MG/5ML PO SUSP
30.0000 mL | Freq: Once | ORAL | Status: AC
  Administered 2014-02-03: 30 mL via ORAL
  Filled 2014-02-03: qty 30

## 2014-02-03 NOTE — Progress Notes (Signed)
CARDIAC REHAB PHASE I   PRE:  Rate/Rhythm: Sinus Rhythm 81  BP:  Supine: 109/60      SaO2: 96% Room Air  MODE:  Ambulation: 500 ft   POST:  Rate/Rhythm: Sinus 98  BP:    Sitting: 105/51    SaO2:96% Room Air  1210-1230 Patient ambulated in the hallway without difficulty using rolling walker.  Assisted back to bed with call light within reach.      Magda Kiel RN BSN

## 2014-02-03 NOTE — Progress Notes (Signed)
1010-1023 No ambulation at this time, patient's BP seated was 81/59, standing 69/47. Recheck seated BP after patient had some water, and it was 93/46. Informed patient's RN of low BP readings. Will f/u in the afternoon.

## 2014-02-03 NOTE — Progress Notes (Signed)
Paged Dr. Andy Gauss regarding pt low BP of 72/36; pt had taken Digoxin at approx 1725; pt states it is a new medication; pt is asymptomatic other than stating she felt "slightly dizzy"; pt alert and oriented; Dr. Andy Gauss returned called; no new orders given at this time; will continue to monitor BP; if remains low; will readdress

## 2014-02-03 NOTE — Progress Notes (Signed)
Patient ID: Jacqueline Rivas, female   DOB: 1932-10-19, 80 y.o.   MRN: [id number] Advanced Heart Failure Rounding Note  Minimal Code: NO CPR or Intubation  Subjective:    Jacqueline Rivas is a 80 y.o. female with a history of multiple myeloma (diagnosed 11/2012 - treated with XRT/chemo), breast CA, CKD and recent diagnosis of systolic HF.   Apparently her EF was normal in September 2014. She was on Herceptin for breast CA with Dr. Marin Olp for 1 year, starting in June 2014. In February, she had an echo which showed an EF of 20-25%. The Herceptin was discontinued. She was admitted last week to [facility] for volume overload. She was diuresed. Discharged to [facility] on 01/22/14. Weight ~127. Discharged on lasix 58m daily. In hospital diuresis was limited by low BP. Incidental finding of lives mass - ? mets   Seen as a new patient in the HF clinic 4/13. Over the past week has had LE edema, SOB, orthopnea and PND. Admitted from clinic for diuresis.   Off milrinone.  Demadex increased to 40 mg daily yesterday and lisinopril started.  Net negative -430.  No dyspnea but getting lightheaded with standing and BP dropping with standing.  Co-ox down to 57% today.   4/1: LVEF 20% RV mildly dilated. Moderate TR   Objective:   Weight Range:  Vital Signs:   Temp:  [97.7 F (36.5 C)-98.7 F (37.1 C)] 97.8 F (36.6 C) (04/18 0834) Pulse Rate:  [67-94] 90 (04/18 0834) Resp:  [16-20] 18 (04/18 0834) BP: (103-128)/(61-87) 128/87 mmHg (04/18 0834) SpO2:  [95 %-98 %] 95 % (04/18 0834) Weight:  [123 lb 14.4 oz (56.2 kg)] 123 lb 14.4 oz (56.2 kg) (04/18 0400) Last BM Date: 01/31/14  Weight change: Filed Weights   02/01/14 0336 02/02/14 0500 02/03/14 0400  Weight: 126 lb 6.4 oz (57.335 kg) 127 lb 6.8 oz (57.8 kg) 123 lb 14.4 oz (56.2 kg)    Intake/Output:   Intake/Output Summary (Last 24 hours) at 02/03/14 1125 Last data filed at 02/03/14 1047  Gross per 24 hour  Intake    740 ml  Output   1152 ml  Net   -412 ml      Physical Exam: General: Sitting in chair Frail, NAD  HEENT: normal  Neck: supple. JVP 8, Carotids 2+ bilat; no bruits. No lymphadenopathy or thryomegaly appreciated.  Cor: PMI laterally displaced. Regular rate & rhythm. No S3. 2/6 TR/MR  Lungs: decreased at bases  Abdomen: soft, nontender, nondistended. No hepatosplenomegaly. No bruits or masses. Good bowel sounds.  Extremities: no cyanosis, clubbing, rash.  1+ ankle edema.  Neuro: alert & oriented x 3, cranial nerves grossly intact. moves all 4 extremities w/o difficulty. Affect pleasant.    Telemetry: SInus   Labs: Basic Metabolic Panel:  Recent Labs Lab 01/29/14 1700 01/30/14 0340 01/31/14 0500 02/01/14 0500 02/02/14 0808 02/03/14 0500  NA 136* 141 142 142 140 140  K 5.7* 4.8 4.2 4.3 4.3 4.0  CL 100 100 99 103 100 100  CO2 20 23 28 25 27 28   GLUCOSE 170* 119* 93 103* 102* 108*  BUN 37* 36* 37* 34* 31* 29*  CREATININE 1.37* 1.33* 1.66* 1.52* 1.33* 1.30*  CALCIUM 10.1 10.0 9.6 9.6 9.9 9.9  MG 2.0  --   --   --   --   --     Liver Function Tests:  Recent Labs Lab 01/29/14 1700  AST 39*  ALT 59*  ALKPHOS 112  BILITOT 1.0  PROT 7.1  ALBUMIN 3.2*   No results found for this basename: LIPASE, AMYLASE,  in the last 168 hours No results found for this basename: AMMONIA,  in the last 168 hours  CBC:  Recent Labs Lab 01/29/14 1700  WBC 6.1  NEUTROABS 4.4  HGB 12.8  HCT 39.4  MCV 102.3*  PLT 197    Cardiac Enzymes: No results found for this basename: CKTOTAL, CKMB, CKMBINDEX, TROPONINI,  in the last 168 hours  BNP: BNP (last 3 results)  Recent Labs  01/16/14 1615 01/29/14 1700  PROBNP 7214.0* 4550.0*    Imaging: No results found.   Medications:     Scheduled Medications: . [START ON 02/04/2014] digoxin  0.0625 mg Oral Daily  . digoxin  0.125 mg Oral Once  . enoxaparin (LOVENOX) injection  30 mg Subcutaneous Q24H  . fentaNYL  12.5 mcg Transdermal Q72H  . sodium chloride  3 mL  Intravenous Q12H  . spironolactone  12.5 mg Oral Daily  . torsemide  40 mg Oral Daily    Infusions:    PRN Medications: sodium chloride, acetaminophen, HYDROmorphone, ondansetron (ZOFRAN) IV, sodium chloride   Assessment:   1. Acute on chronic systolic HF with NYHA IV symptoms and marked volume overload due to presumed NICM EF 20%  2. CKD, stage 3  3. Breast CA  4. Multiple myeloma  5. DNR/DNI   Plan/Discussion:    Co-ox down to 57% today.  She has some symptomatic orthostasis.   - Stop lisinopril.  - Start digoxin 0.125 mg today, then 0.0625 mg daily.  - Continue current torsemide dose for now, creatinine stable.  If weight rises/looks more volume overloaded, may need to increase.   CR consult for education  and ambulation  Likely to [facility] on Monday with HF f/u in 1 week.    Elby Showers Jacqueline Goswami,MD 02/03/2014 11:29 AM

## 2014-02-04 DIAGNOSIS — C773 Secondary and unspecified malignant neoplasm of axilla and upper limb lymph nodes: Secondary | ICD-10-CM | POA: Diagnosis not present

## 2014-02-04 DIAGNOSIS — C50919 Malignant neoplasm of unspecified site of unspecified female breast: Secondary | ICD-10-CM | POA: Diagnosis not present

## 2014-02-04 DIAGNOSIS — I5023 Acute on chronic systolic (congestive) heart failure: Secondary | ICD-10-CM | POA: Diagnosis not present

## 2014-02-04 LAB — BASIC METABOLIC PANEL
BUN: 33 mg/dL — AB (ref 6–23)
CHLORIDE: 100 meq/L (ref 96–112)
CO2: 29 meq/L (ref 19–32)
Calcium: 9.7 mg/dL (ref 8.4–10.5)
Creatinine, Ser: 1.51 mg/dL — ABNORMAL HIGH (ref 0.50–1.10)
GFR calc Af Amer: 37 mL/min — ABNORMAL LOW (ref >90)
GFR calc non Af Amer: 32 mL/min — ABNORMAL LOW (ref >90)
Glucose, Bld: 96 mg/dL (ref 70–99)
POTASSIUM: 3.8 meq/L (ref 3.7–5.3)
Sodium: 139 mEq/L (ref 137–147)

## 2014-02-04 LAB — CARBOXYHEMOGLOBIN
Carboxyhemoglobin: 1.5 % (ref 0.5–1.5)
Methemoglobin: 0.7 % (ref 0.0–1.5)
O2 SAT: 58.1 %
Total hemoglobin: 9.1 g/dL — ABNORMAL LOW (ref 12.0–16.0)

## 2014-02-04 NOTE — Progress Notes (Signed)
Patient ID: Jacqueline Rivas, female   DOB: 1932/05/25, 80 y.o.   MRN: [id number] Advanced Heart Failure Rounding Note  Minimal Code: NO CPR or Intubation  Subjective:    Jacqueline Rivas is a 80 y.o. female with a history of multiple myeloma (diagnosed 11/2012 - treated with XRT/chemo), breast CA, CKD and recent diagnosis of systolic HF.   Apparently her EF was normal in September 2014. She was on Herceptin for breast CA with Dr. Marin Olp for 1 year, starting in June 2014. In February, she had an echo which showed an EF of 20-25%. The Herceptin was discontinued. She was admitted last week to [facility] for volume overload. She was diuresed. Discharged to [facility] on 01/22/14. Weight ~127. Discharged on lasix 48m daily. In hospital diuresis was limited by low BP. Incidental finding of lives mass - ? mets   Seen as a new patient in the HF clinic 4/13. Over the past week has had LE edema, SOB, orthopnea and PND. Admitted from clinic for diuresis.   Off milrinone now.  BP has been soft.  Better this morning but was low overnight.  Digoxin started yesterday.  Creatinine up today.  Co-ox 58%.   4/1: LVEF 20% RV mildly dilated. Moderate TR   Objective:   Weight Range:  Vital Signs:   Temp:  [97.5 F (36.4 C)-98.3 F (36.8 C)] 97.9 F (36.6 C) (04/19 0745) Pulse Rate:  [88-92] 88 (04/18 1900) Resp:  [18-20] 20 (04/18 2300) BP: (64-116)/(20-61) 116/60 mmHg (04/19 0745) SpO2:  [92 %-96 %] 95 % (04/19 0745) Weight:  [122 lb 5.7 oz (55.5 kg)] 122 lb 5.7 oz (55.5 kg) (04/19 0400) Last BM Date: 02/03/14  Weight change: Filed Weights   02/02/14 0500 02/03/14 0400 02/04/14 0400  Weight: 127 lb 6.8 oz (57.8 kg) 123 lb 14.4 oz (56.2 kg) 122 lb 5.7 oz (55.5 kg)    Intake/Output:   Intake/Output Summary (Last 24 hours) at 02/04/14 0916 Last data filed at 02/04/14 0100  Gross per 24 hour  Intake    550 ml  Output    301 ml  Net    249 ml     Physical Exam: General: Sitting in chair Frail, NAD  HEENT:  normal  Neck: supple. JVP not elevated. Carotids 2+ bilat; no bruits. No lymphadenopathy or thryomegaly appreciated.  Cor: PMI laterally displaced. Regular rate & rhythm. No S3. 2/6 TR/MR  Lungs: decreased at bases  Abdomen: soft, nontender, nondistended. No hepatosplenomegaly. No bruits or masses. Good bowel sounds.  Extremities: no cyanosis, clubbing, rash.  Trace ankle edema.  Neuro: alert & oriented x 3, cranial nerves grossly intact. moves all 4 extremities w/o difficulty. Affect pleasant.    Telemetry: SInus   Labs: Basic Metabolic Panel:  Recent Labs Lab 01/29/14 1700  01/31/14 0500 02/01/14 0500 02/02/14 0808 02/03/14 0500 02/04/14 0450  NA 136*  < > 142 142 140 140 139  K 5.7*  < > 4.2 4.3 4.3 4.0 3.8  CL 100  < > 99 103 100 100 100  CO2 20  < > 28 25 27 28 29   GLUCOSE 170*  < > 93 103* 102* 108* 96  BUN 37*  < > 37* 34* 31* 29* 33*  CREATININE 1.37*  < > 1.66* 1.52* 1.33* 1.30* 1.51*  CALCIUM 10.1  < > 9.6 9.6 9.9 9.9 9.7  MG 2.0  --   --   --   --   --   --   < > = values  in this interval not displayed.  Liver Function Tests:  Recent Labs Lab 01/29/14 1700  AST 39*  ALT 59*  ALKPHOS 112  BILITOT 1.0  PROT 7.1  ALBUMIN 3.2*   No results found for this basename: LIPASE, AMYLASE,  in the last 168 hours No results found for this basename: AMMONIA,  in the last 168 hours  CBC:  Recent Labs Lab 01/29/14 1700  WBC 6.1  NEUTROABS 4.4  HGB 12.8  HCT 39.4  MCV 102.3*  PLT 197    Cardiac Enzymes: No results found for this basename: CKTOTAL, CKMB, CKMBINDEX, TROPONINI,  in the last 168 hours  BNP: BNP (last 3 results)  Recent Labs  01/16/14 1615 01/29/14 1700  PROBNP 7214.0* 4550.0*    Imaging: No results found.   Medications:     Scheduled Medications: . digoxin  0.0625 mg Oral Daily  . enoxaparin (LOVENOX) injection  30 mg Subcutaneous Q24H  . fentaNYL  12.5 mcg Transdermal Q72H  . sodium chloride  3 mL Intravenous Q12H  .  spironolactone  12.5 mg Oral Daily    Infusions:    PRN Medications: sodium chloride, acetaminophen, HYDROmorphone, ondansetron (ZOFRAN) IV, sodium chloride   Assessment:   1. Acute on chronic systolic HF with NYHA IV symptoms and marked volume overload due to presumed NICM EF 20%  2. CKD, stage 3  3. Breast CA  4. Multiple myeloma  5. DNR/DNI  6. AKI  Plan/Discussion:    Feels good in general.  BP improved this morning but was low last night.  Creatinine is up to 1.5.  She does not appear volume overloaded on exam. Continue digoxin at low dose.  No torsemide today. Will reassess diuretic need tomorrow.    Likely to [facility] [place] or Tuesday with HF f/u in 1 week.    Elby Showers Syan Cullimore,MD 02/04/2014 9:16 AM

## 2014-02-05 ENCOUNTER — Encounter: Payer: Self-pay | Admitting: *Deleted

## 2014-02-05 DIAGNOSIS — M1990 Unspecified osteoarthritis, unspecified site: Secondary | ICD-10-CM | POA: Diagnosis not present

## 2014-02-05 DIAGNOSIS — N189 Chronic kidney disease, unspecified: Secondary | ICD-10-CM | POA: Diagnosis not present

## 2014-02-05 DIAGNOSIS — R279 Unspecified lack of coordination: Secondary | ICD-10-CM | POA: Diagnosis not present

## 2014-02-05 DIAGNOSIS — I5020 Unspecified systolic (congestive) heart failure: Secondary | ICD-10-CM | POA: Diagnosis not present

## 2014-02-05 DIAGNOSIS — R269 Unspecified abnormalities of gait and mobility: Secondary | ICD-10-CM | POA: Diagnosis not present

## 2014-02-05 DIAGNOSIS — C9000 Multiple myeloma not having achieved remission: Secondary | ICD-10-CM | POA: Diagnosis not present

## 2014-02-05 DIAGNOSIS — R0602 Shortness of breath: Secondary | ICD-10-CM | POA: Diagnosis not present

## 2014-02-05 DIAGNOSIS — D649 Anemia, unspecified: Secondary | ICD-10-CM | POA: Diagnosis not present

## 2014-02-05 DIAGNOSIS — Z853 Personal history of malignant neoplasm of breast: Secondary | ICD-10-CM | POA: Diagnosis not present

## 2014-02-05 DIAGNOSIS — I5023 Acute on chronic systolic (congestive) heart failure: Secondary | ICD-10-CM | POA: Diagnosis not present

## 2014-02-05 DIAGNOSIS — Z170 Estrogen receptor positive status [ER+]: Secondary | ICD-10-CM | POA: Diagnosis not present

## 2014-02-05 DIAGNOSIS — C773 Secondary and unspecified malignant neoplasm of axilla and upper limb lymph nodes: Secondary | ICD-10-CM | POA: Diagnosis not present

## 2014-02-05 DIAGNOSIS — K7689 Other specified diseases of liver: Secondary | ICD-10-CM | POA: Diagnosis not present

## 2014-02-05 DIAGNOSIS — I5022 Chronic systolic (congestive) heart failure: Secondary | ICD-10-CM | POA: Diagnosis not present

## 2014-02-05 DIAGNOSIS — C50919 Malignant neoplasm of unspecified site of unspecified female breast: Secondary | ICD-10-CM | POA: Diagnosis not present

## 2014-02-05 DIAGNOSIS — G894 Chronic pain syndrome: Secondary | ICD-10-CM | POA: Diagnosis not present

## 2014-02-05 DIAGNOSIS — I509 Heart failure, unspecified: Secondary | ICD-10-CM | POA: Diagnosis not present

## 2014-02-05 DIAGNOSIS — I4891 Unspecified atrial fibrillation: Secondary | ICD-10-CM | POA: Diagnosis not present

## 2014-02-05 DIAGNOSIS — M6281 Muscle weakness (generalized): Secondary | ICD-10-CM | POA: Diagnosis not present

## 2014-02-05 DIAGNOSIS — G4700 Insomnia, unspecified: Secondary | ICD-10-CM | POA: Diagnosis not present

## 2014-02-05 DIAGNOSIS — N183 Chronic kidney disease, stage 3 unspecified: Secondary | ICD-10-CM | POA: Diagnosis not present

## 2014-02-05 DIAGNOSIS — I10 Essential (primary) hypertension: Secondary | ICD-10-CM | POA: Diagnosis not present

## 2014-02-05 DIAGNOSIS — Z79899 Other long term (current) drug therapy: Secondary | ICD-10-CM | POA: Diagnosis not present

## 2014-02-05 DIAGNOSIS — D518 Other vitamin B12 deficiency anemias: Secondary | ICD-10-CM | POA: Diagnosis not present

## 2014-02-05 LAB — BASIC METABOLIC PANEL
BUN: 32 mg/dL — AB (ref 6–23)
CHLORIDE: 96 meq/L (ref 96–112)
CO2: 27 meq/L (ref 19–32)
Calcium: 9.6 mg/dL (ref 8.4–10.5)
Creatinine, Ser: 1.35 mg/dL — ABNORMAL HIGH (ref 0.50–1.10)
GFR calc Af Amer: 42 mL/min — ABNORMAL LOW (ref >90)
GFR calc non Af Amer: 36 mL/min — ABNORMAL LOW (ref >90)
Glucose, Bld: 92 mg/dL (ref 70–99)
POTASSIUM: 3.9 meq/L (ref 3.7–5.3)
Sodium: 136 mEq/L — ABNORMAL LOW (ref 137–147)

## 2014-02-05 MED ORDER — CARVEDILOL 3.125 MG PO TABS
3.1250 mg | ORAL_TABLET | Freq: Two times a day (BID) | ORAL | Status: DC
  Filled 2014-02-05 (×3): qty 1

## 2014-02-05 MED ORDER — SPIRONOLACTONE 25 MG PO TABS: 25.0000 mg | ORAL_TABLET | Freq: Every day | ORAL | Status: DC

## 2014-02-05 MED ORDER — SPIRONOLACTONE 25 MG PO TABS
25.0000 mg | ORAL_TABLET | Freq: Every day | ORAL | Status: DC
  Administered 2014-02-05: 25 mg via ORAL
  Filled 2014-02-05: qty 1

## 2014-02-05 MED ORDER — HYDROMORPHONE HCL 2 MG PO TABS: 2.0000 mg | ORAL_TABLET | Freq: Four times a day (QID) | ORAL | Status: DC | PRN

## 2014-02-05 MED ORDER — TORSEMIDE 20 MG PO TABS
20.0000 mg | ORAL_TABLET | Freq: Once | ORAL | Status: AC
  Administered 2014-02-05: 20 mg via ORAL
  Filled 2014-02-05: qty 1

## 2014-02-05 MED ORDER — HEPARIN SOD (PORK) LOCK FLUSH 100 UNIT/ML IV SOLN
500.0000 [IU] | INTRAVENOUS | Status: DC | PRN
  Administered 2014-02-05: 500 [IU]
  Filled 2014-02-05: qty 5

## 2014-02-05 MED ORDER — TORSEMIDE 20 MG PO TABS: 20.0000 mg | ORAL_TABLET | Freq: Once | ORAL | Status: DC

## 2014-02-05 MED ORDER — SODIUM CHLORIDE 0.9 % IJ SOLN
10.0000 mL | INTRAMUSCULAR | Status: DC | PRN
  Administered 2014-02-05: 10 mL

## 2014-02-05 MED ORDER — LISINOPRIL 2.5 MG PO TABS
2.5000 mg | ORAL_TABLET | Freq: Every day | ORAL | Status: DC
  Filled 2014-02-05: qty 1

## 2014-02-05 MED ORDER — SODIUM CHLORIDE 0.9 % IJ SOLN: 10.0000 mL | Freq: Two times a day (BID) | INTRAMUSCULAR | Status: DC

## 2014-02-05 MED ORDER — DIGOXIN 62.5 MCG PO TABS: 0.0625 mg | ORAL_TABLET | Freq: Every day | ORAL | Status: DC

## 2014-02-05 MED ORDER — HEPARIN SOD (PORK) LOCK FLUSH 100 UNIT/ML IV SOLN
500.0000 [IU] | INTRAVENOUS | Status: DC
Start: 2014-02-05 — End: 2014-02-05
  Filled 2014-02-05: qty 5

## 2014-02-05 MED ORDER — FUROSEMIDE 40 MG PO TABS: 40.0000 mg | ORAL_TABLET | Freq: Every day | ORAL | Status: DC

## 2014-02-05 NOTE — Progress Notes (Signed)
Pt transferred to [facility] Unit CSW provided a handoff. This CSW signing off.   Ky Barban, MSW, [facility] Clinical Social Worker [phone]

## 2014-02-05 NOTE — Discharge Summary (Signed)
Advanced Heart Failure Team  Discharge Summary   Patient ID: Jacqueline Rivas MRN: [id number], DOB/AGE: [birth year] 80 y.o. Admit date: 01/29/2014 D/C date:     02/05/2014   Primary Discharge Diagnoses:  1. Acute on chronic systolic HF with NYHA IV symptoms and marked volume overload due to presumed NICM EF 20%  2. CKD, stage 3  3. Breast CA  4. Multiple myeloma  5. DNR/DNI  6. AKI    Hospital Course:  Jacqueline Rivas is a 80 y.o. female with a history of multiple myeloma (diagnosed 11/2012 - treated with XRT/chemo), breast CA, CKD and recent diagnosis of systolic HF. EF was normal in September 2014. She was on Herceptin for breast CA with Dr. Marin Olp for 1 year, starting in June 2014. In February, she had an echo which showed an EF of 20-25% and at time Herceptin was discontinued. She was admitted to [facility] on 01/16/14 with volume overload. She was diuresed and discharged to [facility] on 01/22/14. Weight ~127. Discharged on lasix 103m daily. In hospital diuresis was limited by low BP. Incidental finding of lives mass - ? Mets.  [facility]. She required short term Milrinone with IV lasix. As she improved Milrinone was weaned off with adequate mixed venous sat of 58%. Once she achieved euvolemia IV lasix was stopped and she transitioned to torsemide 40 mg daily but this was decreased to 20 mg daily and 25 mg spironolactone. Overall she diuresed 10 pounds. She will remain off carvedilol and lisinopril for now and continue low dose digoxin 0.0625 mg daily. Hopefully can add in outpatient setting as her blood pressure and renal function allow. Will need to watch creatinine closely.   [facility] will need BMET and dig level on 02/08/14. She will continue to be followed closely in the HF clinic with follow up scheduled 02/12/14 at 10:30.     Discharge Weight Range:  Discharge weight 123  pounds.  Discharge Vitals: Blood pressure 122/65, pulse 78, temperature 98.4 F (36.9 C), temperature source Oral, resp. rate 18, height 5' 4"  (1.626 m), weight 123 lb 3.2 oz (55.883 kg), SpO2 96.00%.  Labs: Lab Results  Component Value Date   WBC 6.1 01/29/2014   HGB 12.8 01/29/2014   HCT 39.4 01/29/2014   MCV 102.3* 01/29/2014   PLT 197 01/29/2014    Recent Labs Lab 01/29/14 1700  02/05/14 0500  NA 136*  < > 136*  K 5.7*  < > 3.9  CL 100  < > 96  CO2 20  < > 27  BUN 37*  < > 32*  CREATININE 1.37*  < > 1.35*  CALCIUM 10.1  < > 9.6  PROT 7.1  --   --   BILITOT 1.0  --   --   ALKPHOS 112  --   --   ALT 59*  --   --   AST 39*  --   --   GLUCOSE 170*  < > 92  < > = values in this interval not displayed. No results found for this basename: CHOL,  HDL,  LDLCALC,  TRIG   BNP (last 3 results)  Recent Labs  01/16/14 1615 01/29/14 1700  PROBNP 7214.0* 4550.0*    Diagnostic Studies/Procedures   No results found.  Discharge Medications     Medication List    STOP taking these medications  carvedilol 3.125 MG tablet  Commonly known as:  COREG     furosemide 20 MG tablet  Commonly known as:  LASIX      TAKE these medications       calcium carbonate 600 MG Tabs tablet  Commonly known as:  OS-CAL  Take 1,200 mg by mouth daily with breakfast.     Co Q 10 100 MG Caps  Take 100 mg by mouth every morning.     Digoxin 62.5 MCG Tabs  Take 0.0625 mg by mouth daily.     famciclovir 500 MG tablet  Commonly known as:  FAMVIR  Take 500 mg by mouth every morning.     HYDROmorphone 2 MG tablet  Commonly known as:  DILAUDID  Take 2 mg by mouth every 6 (six) hours as needed (pain).     HYDROmorphone 2 MG tablet  Commonly known as:  DILAUDID  Take 1 tablet (2 mg total) by mouth every 6 (six) hours as needed for severe pain.     Lenalidomide 20 MG Caps  Commonly known as:  REVLIMID  Take 20 mg by mouth every morning. 21 days on 7 days off. Auth # [id number]      omeprazole 20 MG capsule  Commonly known as:  PRILOSEC  Take 20 mg by mouth every morning.     potassium chloride SA 20 MEQ tablet  Commonly known as:  K-DUR,KLOR-CON  Take 20 mEq by mouth daily.     spironolactone 25 MG tablet  Commonly known as:  ALDACTONE  Take 1 tablet (25 mg total) by mouth daily.     torsemide 20 MG tablet  Commonly known as:  DEMADEX  Take 1 tablet (20 mg total) by mouth once.     zolpidem 5 MG tablet  Commonly known as:  AMBIEN  Take 5 mg by mouth at bedtime as needed for sleep.        Disposition   The patient will be discharged in stable condition to home. Discharge Orders   Future Appointments Provider Department Dept Phone   02/12/2014 10:20 AM [place] [phone]   02/15/2014 9:00 AM Gwendolyn [place] [phone]   02/15/2014 9:30 AM [name], MD [place] [phone]   02/15/2014 10:30 AM [facility] [place] [phone]   Future Orders Complete By Expires   Contraindication to ACEI at discharge  As directed    Scheduling Instructions:   Hypotension   Diet - low sodium heart healthy  As directed    Heart Failure patients record your daily weight using the same scale at the same time of day  As directed    Heart failure skilled nursing facility orders  As directed    Questions:     Heart Failure Follow-up Care:  Advanced Heart Failure (AHF) Clinic at [phone]   Obtain the following labs:  Basic Metabolic Panel   Lab frequency:  Other see comments   Fax lab results to:  AHF Clinic at [phone]   Daily Weights and Symptom Monitoring:  Fax weight chart weekly X 2 to AHF Clinic at [phone]   Diet:  No Added Salt   PT/OT Consult:  Yes   Activity:  As tolerated   Fluid restrictions:  2000 mL Fluid   Increase activity slowly  As directed      Follow-up Information  Follow up with [facility] On 02/12/2014. (@ 10:20 am; [employer]; Heart Failure clinic)    Specialty:  Cardiology   Contact information:   [address] [id number] [place]  [address] [phone]        Duration of Discharge Encounter: Greater than 35 minutes   Signed, Conrad Riverside NP-C  02/05/2014, 9:29 AM  Patient seen and examined with Darrick Grinder, NP. We discussed all aspects of the encounter. I agree with the assessment and plan as stated above. See our progress from today for further details.   Shaune Pascal Elysse Polidore,MD 9:56 AM

## 2014-02-05 NOTE — Progress Notes (Signed)
Report called to Sharyn Lull, supervisor at the nursing home.  Verbalized understanding. All d/c instructions given to pt's son and also copy of d/c instructions as requested to review pt's med as he requested at 1330.  Karie Kirks, Therapist, sports.

## 2014-02-05 NOTE — Clinical Social Work Placement (Addendum)
     Clinical Social Work Department CLINICAL SOCIAL WORK PLACEMENT NOTE 02/05/2014  Patient:  Jacqueline Rivas, Jacqueline Rivas  Account Number:  [id number] [place] date:  02/15/2014  Clinical Social Worker:  Ky Barban, Latanya Presser  Date/time:  02/02/2014 12:09 PM  Clinical Social Work is seeking post-discharge placement for this patient at the following level of care:   [place]   (*CSW will update this form in Epic as items are completed)   02/02/2014  Patient/family provided with [place] Department of Clinical Social Works list of facilities offering this level of care within the geographic area requested by the patient (or if unable, by the patients family).  02/02/2014  Patient/family informed of their freedom to choose among providers that offer the needed level of care, that participate in Medicare, Medicaid or managed care program needed by the patient, have an available bed and are willing to accept the patient.  02/02/2014  Patient/family informed of [facility] ownership interest in [facility], as well as of the fact that they are under no obligation to receive care at this facility.  PASARR submitted to EDS on  PASARR number received from [place] on   FL2 transmitted to all facilities in geographic area requested by pt/family on  02/02/2014 FL2 transmitted to all facilities within larger geographic area on   Patient informed that his/her managed care company has contracts with or will negotiate with  certain facilities, including the following:   NA- has existing PASARR     Patient/family informed of bed offers received:  02/02/2014 Patient chooses bed at [place] Physician recommends and patient chooses bed at    Patient to be transferred to [facility] on  02/05/2014 Patient to be transferred to facility by Ambulance  Corey Harold)  The following physician request were entered in Epic:   Additional Comments: 02/05/14  OK per MD for d/c today to SNF at [facility]. Patient was a transfer to [place] over the weekend. Son to transport via car after completion of admission paper work. Patient and son are pleased with d/c. plan. Notified nursing to call report.  No further CSW needs identified. CSW signing off.  Lorie [place]. [place], [place]

## 2014-02-05 NOTE — Care Management Note (Addendum)
    Page 1 of 1   02/05/2014     10:25:28 AM CARE MANAGEMENT NOTE 02/05/2014  Patient:  Jacqueline Rivas, Jacqueline Rivas   Account Number:  [id number]  Date Initiated:    Documentation initiated by:    Subjective/Objective Assessment:     Action/Plan:   Anticipated DC Date:     Anticipated DC Plan:           Choice offered to / List presented to:             Status of service:   Medicare Important Message given?   (If response is "NO", the following Medicare IM given date fields will be blank) Date Medicare IM given:   Date Additional Medicare IM given:    Discharge Disposition:    Per UR Regulation:    If discussed at Long Length of Stay Meetings, dates discussed:    Comments:

## 2014-02-05 NOTE — Progress Notes (Signed)
Physical Therapy Treatment Patient Details Name: Jacqueline Rivas MRN: [id number] DOB: Jul 02, 1932 Today's Date: Mar 02, 2014    History of Present Illness Pt is a 80 year old presenting with SOB, B LE edema, and CHF; pt has a history of breast cancer s/p mastectomy and multiple myeloma    PT Comments    Pt progressing well with mobility, continues to require cues for safety with RW use.  Initiated HEP for strengthening as pt states "I feel my legs are weak". Pt very motivated to improve.  Follow Up Recommendations  Supervision for mobility/OOB;SNF     Equipment Recommendations  None recommended by PT    Recommendations for Other Services       Precautions / Restrictions Precautions Precautions: Fall Restrictions Weight Bearing Restrictions: No    Mobility  Bed Mobility               General bed mobility comments: Pt sitting up in recliner when PT enters.  Transfers Overall transfer level: Modified independent Equipment used: Rolling walker (2 wheeled)   Sit to Stand: Modified independent (Device/Increase time)         General transfer comment: with RW.  multiple reps of sit to stand wiht mod I as part of exercise program  Ambulation/Gait Ambulation/Gait assistance: Supervision Ambulation Distance (Feet): 250 Feet Assistive device: Rolling walker (2 wheeled)       General Gait Details: cues for safety with RW with obstacle negotiation   Stairs            Wheelchair Mobility    Modified Rankin (Stroke Patients Only)       Balance                                    Cognition Arousal/Alertness: Awake/alert Behavior During Therapy: WFL for tasks assessed/performed Overall Cognitive Status: Within Functional Limits for tasks assessed                      Exercises General Exercises - Lower Extremity Ankle Circles/Pumps: AROM;Both;10 reps;Seated Long Arc Quad: AROM;Both;10 reps Hip Flexion/Marching: AROM;Both;10  reps    General Comments        Pertinent Vitals/Pain No c/o pain    Home Living                      Prior Function            PT Goals (current goals can now be found in the care plan section) Progress towards PT goals: Progressing toward goals    Frequency  Min 3X/week    PT Plan Current plan remains appropriate    Co-evaluation             End of Session   Activity Tolerance: Patient tolerated treatment well Patient left: in chair;with call bell/phone within reach     Time: 0730-0753 PT Time Calculation (min): 23 min  Charges:  $Gait Training: 8-22 mins $Therapeutic Exercise: 8-22 mins                    G Codes:      Kennith Gain Mar 02, 2014, 9:45 AM

## 2014-02-05 NOTE — Discharge Instructions (Signed)
Heart Failure Heart failure means your heart has trouble pumping blood. This makes it hard for your body to work well. Heart failure is usually a long-term (chronic) condition. You must take good care of yourself and follow your doctor's treatment plan. HOME CARE  Take your heart medicine as told by your doctor.  Do not stop taking medicine unless your doctor tells you to.  Do not skip any dose of medicine.  Refill your medicines before they run out.  Take other medicines only as told by your doctor or pharmacist.  Stay active if told by your doctor. The elderly and people with severe heart failure should talk with a doctor about physical activity.  Eat heart healthy foods. Choose foods that are without trans fat and are low in saturated fat, cholesterol, and salt (sodium). This includes fresh or frozen fruits and vegetables, fish, lean meats, fat-free or low-fat dairy foods, whole grains, and high-fiber foods. Lentils and dried peas and beans (legumes) are also good choices.  Limit salt if told by your doctor.  Cook in a healthy way. Roast, grill, broil, bake, poach, steam, or stir-fry foods.  Limit fluids as told by your doctor.  Weigh yourself every morning. Do this after you pee (urinate) and before you eat breakfast. Write down your weight to give to your doctor.  Take your blood pressure and write it down if your doctor tell you to.  Ask your doctor how to check your pulse. Check your pulse as told.  Lose weight if told by your doctor.  Stop smoking or chewing tobacco. Do not use gum or patches that help you quit without your doctor's approval.  Schedule and go to doctor visits as told.  Nonpregnant women should have no more than 1 drink a day. Men should have no more than 2 drinks a day. Talk to your doctor about drinking alcohol.  Stop illegal drug use.  Stay current with shots (immunizations).  Manage your health conditions as told by your doctor.  Learn to manage  your stress.  Rest when you are tired.  If it is really hot outside:  Avoid intense activities.  Use air conditioning or fans, or get in a cooler place.  Avoid caffeine and alcohol.  Wear loose-fitting, lightweight, and light-colored clothing.  If it is really cold outside:  Avoid intense activities.  Layer your clothing.  Wear mittens or gloves, a hat, and a scarf when going outside.  Avoid alcohol.  Learn about heart failure and get support as needed.  Get help to maintain or improve your quality of life and your ability to care for yourself as needed. GET HELP IF:   You gain 03 lb/1.4 kg or more in 1 day or 05 lb/2.3 kg in a week.  You are more short of breath than usual.  You cannot do your normal activities.  You tire easily.  You cough more than normal, especially with activity.  You have any or more puffiness (swelling) in areas such as your hands, feet, ankles, or belly (abdomen).  You cannot sleep because it is hard to breathe.  You feel like your heart is beating fast (palpitations).  You get dizzy or lightheaded when you stand up. GET HELP RIGHT AWAY IF:   You have trouble breathing.  There is a change in mental status, such as becoming less alert or not being able to focus.  You have chest pain or discomfort.  You faint. MAKE SURE YOU:   Understand these  instructions.  Will watch your condition.  Will get help right away if you are not doing well or get worse. Document Released: 07/14/2008 Document Revised: 01/30/2013 Document Reviewed: 05/05/2012 [facility] Patient Information 2014 [place], [place].  Heart Failure Heart failure means your heart has trouble pumping blood. This makes it hard for your body to work well. Heart failure is usually a long-term (chronic) condition. You must take good care of yourself and follow your doctor's treatment plan. HOME CARE  Take your heart medicine as told by your doctor.  Do not stop taking medicine  unless your doctor tells you to.  Do not skip any dose of medicine.  Refill your medicines before they run out.  Take other medicines only as told by your doctor or pharmacist.  Stay active if told by your doctor. The elderly and people with severe heart failure should talk with a doctor about physical activity.  Eat heart healthy foods. Choose foods that are without trans fat and are low in saturated fat, cholesterol, and salt (sodium). This includes fresh or frozen fruits and vegetables, fish, lean meats, fat-free or low-fat dairy foods, whole grains, and high-fiber foods. Lentils and dried peas and beans (legumes) are also good choices.  Limit salt if told by your doctor.  Cook in a healthy way. Roast, grill, broil, bake, poach, steam, or stir-fry foods.  Limit fluids as told by your doctor.  Weigh yourself every morning. Do this after you pee (urinate) and before you eat breakfast. Write down your weight to give to your doctor.  Take your blood pressure and write it down if your doctor tell you to.  Ask your doctor how to check your pulse. Check your pulse as told.  Lose weight if told by your doctor.  Stop smoking or chewing tobacco. Do not use gum or patches that help you quit without your doctor's approval.  Schedule and go to doctor visits as told.  Nonpregnant women should have no more than 1 drink a day. Men should have no more than 2 drinks a day. Talk to your doctor about drinking alcohol.  Stop illegal drug use.  Stay current with shots (immunizations).  Manage your health conditions as told by your doctor.  Learn to manage your stress.  Rest when you are tired.  If it is really hot outside:  Avoid intense activities.  Use air conditioning or fans, or get in a cooler place.  Avoid caffeine and alcohol.  Wear loose-fitting, lightweight, and light-colored clothing.  If it is really cold outside:  Avoid intense activities.  Layer your  clothing.  Wear mittens or gloves, a hat, and a scarf when going outside.  Avoid alcohol.  Learn about heart failure and get support as needed.  Get help to maintain or improve your quality of life and your ability to care for yourself as needed. GET HELP IF:   You gain 03 lb/1.4 kg or more in 1 day or 05 lb/2.3 kg in a week.  You are more short of breath than usual.  You cannot do your normal activities.  You tire easily.  You cough more than normal, especially with activity.  You have any or more puffiness (swelling) in areas such as your hands, feet, ankles, or belly (abdomen).  You cannot sleep because it is hard to breathe.  You feel like your heart is beating fast (palpitations).  You get dizzy or lightheaded when you stand up. GET HELP RIGHT AWAY IF:   You have  trouble breathing.  There is a change in mental status, such as becoming less alert or not being able to focus.  You have chest pain or discomfort.  You faint. MAKE SURE YOU:   Understand these instructions.  Will watch your condition.  Will get help right away if you are not doing well or get worse. Document Released: 07/14/2008 Document Revised: 01/30/2013 Document Reviewed: 05/05/2012 [facility] Patient Information 2014 [place], [place].

## 2014-02-05 NOTE — Progress Notes (Addendum)
Patient ID: Jacqueline Rivas, female   DOB: Mar 17, 1932, 80 y.o.   MRN: [id number] Advanced Heart Failure Rounding Note  Minimal Code: NO CPR or Intubation  Subjective:    Jacqueline Rivas is a 80 y.o. female with a history of multiple myeloma (diagnosed 11/2012 - treated with XRT/chemo), breast CA, CKD and recent diagnosis of systolic HF.   Apparently her EF was normal in September 2014. She was on Herceptin for breast CA with Dr. Marin Olp for 1 year, starting in June 2014. In February, she had an echo which showed an EF of 20-25%. The Herceptin was discontinued. She was admitted last week to [facility] for volume overload. She was diuresed. Discharged to [facility] on 01/22/14. Weight ~127. Discharged on lasix 46m daily. In hospital diuresis was limited by low BP. Incidental finding of lives mass - ? mets   Seen as a new patient in the HF clinic 4/13. Over the past week has had LE edema, SOB, orthopnea and PND. Admitted from clinic for diuresis. Received short term milrinone but now off.   Yesterday diuretics held due to creatinine bump. Today creatinine back down from 1.5>1.3. Mild dyspnea with exertion. Denies orthopnea.   4/1: LVEF 20% RV mildly dilated. Moderate TR   Objective:   Weight Range:  Vital Signs:   Temp:  [97.4 F (36.3 C)-98.4 F (36.9 C)] 98.4 F (36.9 C) (04/20 0521) Pulse Rate:  [78-89] 78 (04/20 0521) Resp:  [18-20] 18 (04/20 0521) BP: (90-122)/(49-70) 122/65 mmHg (04/20 0521) SpO2:  [94 %-97 %] 96 % (04/20 0521) Weight:  [122 lb 11.2 oz (55.656 kg)-123 lb 3.2 oz (55.883 kg)] 123 lb 3.2 oz (55.883 kg) (04/20 0521) Last BM Date: 02/03/14  Weight change: Filed Weights   02/04/14 0400 02/04/14 1230 02/05/14 0521  Weight: 122 lb 5.7 oz (55.5 kg) 122 lb 11.2 oz (55.656 kg) 123 lb 3.2 oz (55.883 kg)    Intake/Output:   Intake/Output Summary (Last 24 hours) at 02/05/14 0742 Last data filed at 02/05/14 0648  Gross per 24 hour  Intake    806 ml  Output    350 ml  Net    456 ml      Physical Exam: General: Sitting in chair Frail, NAD  HEENT: normal  Neck: supple. JVP not elevated. Carotids 2+ bilat; no bruits. No lymphadenopathy or thryomegaly appreciated.  Cor: PMI laterally displaced. Regular rate & rhythm. No S3. 2/6 TR/MR  Lungs: decreased at bases  Abdomen: soft, nontender, nondistended. No hepatosplenomegaly. No bruits or masses. Good bowel sounds.  Extremities: no cyanosis, clubbing, rash.  No lower extremity edema.   Neuro: alert & oriented x 3, cranial nerves grossly intact. moves all 4 extremities w/o difficulty. Affect pleasant.    Telemetry: SR  Labs: Basic Metabolic Panel:  Recent Labs Lab 01/29/14 1700  02/01/14 0500 02/02/14 0808 02/03/14 0500 02/04/14 0450 02/05/14 0500  NA 136*  < > 142 140 140 139 136*  K 5.7*  < > 4.3 4.3 4.0 3.8 3.9  CL 100  < > 103 100 100 100 96  CO2 20  < > 25 27 28 29 27   GLUCOSE 170*  < > 103* 102* 108* 96 92  BUN 37*  < > 34* 31* 29* 33* 32*  CREATININE 1.37*  < > 1.52* 1.33* 1.30* 1.51* 1.35*  CALCIUM 10.1  < > 9.6 9.9 9.9 9.7 9.6  MG 2.0  --   --   --   --   --   --   < > =  values in this interval not displayed.  Liver Function Tests:  Recent Labs Lab 01/29/14 1700  AST 39*  ALT 59*  ALKPHOS 112  BILITOT 1.0  PROT 7.1  ALBUMIN 3.2*   No results found for this basename: LIPASE, AMYLASE,  in the last 168 hours No results found for this basename: AMMONIA,  in the last 168 hours  CBC:  Recent Labs Lab 01/29/14 1700  WBC 6.1  NEUTROABS 4.4  HGB 12.8  HCT 39.4  MCV 102.3*  PLT 197    Cardiac Enzymes: No results found for this basename: CKTOTAL, CKMB, CKMBINDEX, TROPONINI,  in the last 168 hours  BNP: BNP (last 3 results)  Recent Labs  01/16/14 1615 01/29/14 1700  PROBNP 7214.0* 4550.0*    Imaging: No results found.   Medications:     Scheduled Medications: . digoxin  0.0625 mg Oral Daily  . enoxaparin (LOVENOX) injection  30 mg Subcutaneous Q24H  . fentaNYL  12.5  mcg Transdermal Q72H  . sodium chloride  3 mL Intravenous Q12H  . spironolactone  12.5 mg Oral Daily    Infusions:    PRN Medications: sodium chloride, acetaminophen, HYDROmorphone, ondansetron (ZOFRAN) IV, sodium chloride   Assessment:   1. Acute on chronic systolic HF with NYHA IV symptoms and marked volume overload due to presumed NICM EF 20%  2. CKD, stage 3  3. Breast CA  4. Multiple myeloma  5. DNR/DNI  6. AKI  Plan/Discussion:    Volume status stable. Continue spiro 12.5 mg daily. Restart lasix 40 mg daily tomorrow.  Add 3.125 mg carvedilol twice a day. Hold off on lisinopril can restart outpatient. Follow BMET closely.   Likely to [facility] today. Will set up follow up in HF clinic next week.   Conrad Lanett, NP-C  02/05/2014 7:42 AM  Patient seen and examined with Darrick Grinder, NP. We discussed all aspects of the encounter. I agree with the assessment and plan as stated above.   She is improved but remains tenuous. Will increase spiro to 25 daily and continue demadex 20 daily. Hopefully can start low dose lisinopril 2.5 qhs and  b-blocker in near future. To [facility] today. F/u in Clinic on Monday. BMET on Thursday.  Daniel R Bensimhon,MD 8:15 AM

## 2014-02-05 NOTE — Care Management Note (Addendum)
  Page 1 of 1   02/05/2014     10:29:26 AM CARE MANAGEMENT NOTE 02/05/2014  Patient:  Jacqueline, Rivas   Account Number:  [id number]  Date Initiated:  02/05/2014  Documentation initiated by:  Mariann Laster  Subjective/Objective Assessment:   Admitted with CHF     Action/Plan:   CM to follow for disposition needs   Anticipated DC Date:  02/05/2014   Anticipated DC Plan:  [place]  In-house referral  Clinical Social Worker         Choice offered to / List presented to:             Status of service:  Completed, signed off Medicare Important Message given?   (If response is "NO", the following Medicare IM given date fields will be blank) Date Medicare IM given:   Date Additional Medicare IM given:    Discharge Disposition:  [place]  Per UR Regulation:  Reviewed for med. necessity/level of care/duration of stay  If discussed at [place] of Stay Meetings, dates discussed:   02/06/2014    Comments:  02/05/2014 Dispositon Plan: D/C SNF / [facility] Active with [place] RN, BSN, [place], [place] 02/05/2014

## 2014-02-05 NOTE — Progress Notes (Signed)
[id number] Cardiac Rehab Completed CHF education with pt and son. We discussed CHF signs and symptoms, daily weights, fluid restrictions, exercise guidelines and low sodium diet. They voice understanding. Deon Pilling, RN 02/05/2014 11:20 AM

## 2014-02-06 ENCOUNTER — Encounter: Payer: Self-pay | Admitting: Adult Health

## 2014-02-06 ENCOUNTER — Non-Acute Institutional Stay (SKILLED_NURSING_FACILITY): Payer: Medicare Other | Admitting: Adult Health

## 2014-02-06 DIAGNOSIS — I5023 Acute on chronic systolic (congestive) heart failure: Secondary | ICD-10-CM | POA: Diagnosis not present

## 2014-02-06 DIAGNOSIS — C50919 Malignant neoplasm of unspecified site of unspecified female breast: Secondary | ICD-10-CM

## 2014-02-06 DIAGNOSIS — N189 Chronic kidney disease, unspecified: Secondary | ICD-10-CM | POA: Insufficient documentation

## 2014-02-06 DIAGNOSIS — N183 Chronic kidney disease, stage 3 unspecified: Secondary | ICD-10-CM

## 2014-02-06 DIAGNOSIS — G894 Chronic pain syndrome: Secondary | ICD-10-CM

## 2014-02-06 DIAGNOSIS — C9000 Multiple myeloma not having achieved remission: Secondary | ICD-10-CM | POA: Diagnosis not present

## 2014-02-06 DIAGNOSIS — G4700 Insomnia, unspecified: Secondary | ICD-10-CM

## 2014-02-06 DIAGNOSIS — C773 Secondary and unspecified malignant neoplasm of axilla and upper limb lymph nodes: Secondary | ICD-10-CM

## 2014-02-06 NOTE — Progress Notes (Addendum)
Patient ID: Jacqueline Rivas, female   DOB: Jun 21, 1932, 80 y.o.   MRN: [id number]               PROGRESS NOTE  DATE: 02/06/2014  FACILITY: Nursing Home Location: [facility]  LEVEL OF CARE: SNF (31)  Acute Visit  CHIEF COMPLAINT:  Follow-up Hospitalization  HISTORY OF PRESENT ILLNESS: This is a 80 year old female who has been admitted to [facility] on 4/20 /15 from [facility] with acute on chronic systolic heart failure and marked overload due to presumed EF 20%. She has been admitted for a short-term rehabilitation.   REASSESSMENT OF ONGOING PROBLEM(S):  INSOMNIA: The insomnia remains stable.  No complications noted from the medications presently being used. Patient denies ongoing insomnia, pain, hallucinations, delusions.  CHF:The patient denies sob, DOE, orthopnea, PNDs, palpitations or chest pain.  No complications form the medications being used.  CHRONIC PAIN: The patient's chronic pain remains stable.  No complications reported from the medications presently being used.  Patient denies ongoing pain.  PAST MEDICAL HISTORY : Reviewed.  No changes/see problem list  CURRENT MEDICATIONS: Reviewed per MAR/see medication list  REVIEW OF SYSTEMS:  GENERAL: no change in appetite, no fatigue, no weight changes, no fever, chills or weakness RESPIRATORY: no cough, SOB, DOE, wheezing, hemoptysis CARDIAC: no chest pain,or palpitations, +edema GI: no abdominal pain, diarrhea, constipation, heart burn, nausea or vomiting  PHYSICAL EXAMINATION  GENERAL: no acute distress, normal body habitus EYES: conjunctivae normal, sclerae normal, normal eye lids NECK: supple, trachea midline, no neck masses, no thyroid tenderness, no thyromegaly LYMPHATICS: no LAN in the neck, no supraclavicular LAN RESPIRATORY: breathing is even & unlabored, BS CTAB CARDIAC: RRR, no extra heart sounds, BLE edema 3+ GI: abdomen soft, normal BS, no masses, no tenderness, no hepatomegaly,  no splenomegaly EXTREMITIES:  Able to move all 4 extremities PSYCHIATRIC: the patient is alert & oriented to person, affect & behavior appropriate  LABS/RADIOLOGY: Labs reviewed: Basic Metabolic Panel:  Recent Labs  01/19/14 0355 01/20/14 0520  01/22/14 0457 01/29/14 1700  02/03/14 0500 02/04/14 0450 02/05/14 0500  NA 128* 134*  < > 137 136*  < > 140 139 136*  K 4.1 3.4*  < > 4.2 5.7*  < > 4.0 3.8 3.9  CL 90* 93*  < > 99 100  < > 100 100 96  CO2 23 25  < > 28 20  < > 28 29 27   GLUCOSE 145* 140*  < > 117* 170*  < > 108* 96 92  BUN 55* 50*  < > 31* 37*  < > 29* 33* 32*  CREATININE 1.58* 1.59*  < > 1.21* 1.37*  < > 1.30* 1.51* 1.35*  CALCIUM 9.1 9.5  < > 9.5 10.1  < > 9.9 9.7 9.6  MG  --   --   --   --  2.0  --   --   --   --   PHOS  --  3.3  --  2.3  --   --   --   --   --   < > = values in this interval not displayed. Liver Function Tests:  Recent Labs  01/19/14 0355  01/21/14 0500 01/22/14 0457 01/29/14 1700  AST 46*  --  28  --  39*  ALT 59*  --  37*  --  59*  ALKPHOS 127*  --  124*  --  112  BILITOT 0.4  --  0.5  --  1.0  PROT 6.4  --  6.5  --  7.1  ALBUMIN 2.9*  < > 2.8* 2.8* 3.2*  < > = values in this interval not displayed.   CBC:  Recent Labs  12/21/13 1045 01/16/14 1615  01/21/14 0500 01/22/14 0500 01/29/14 1700  WBC 4.1 5.9  < > 4.7 5.6 6.1  NEUTROABS 3.5 4.7  --   --   --  4.4  HGB 9.3* 9.0*  < > 10.8* 11.0* 12.8  HCT 29.5* 27.4*  < > 32.9* 34.8* 39.4  MCV 101 97.2  < > 97.3 100.9* 102.3*  PLT 146 228  < > 168 188 197  < > = values in this interval not displayed.  Cardiac Enzymes:  Recent Labs  01/16/14 2015 01/17/14 0117 01/17/14 0819  TROPONINI <0.30 <0.30 <0.30    ASSESSMENT/PLAN:  Chronic systolic heart failure - stable; continue Digoxin, Torsemide and Aldactone CKD, stage 3 - will check BMP Breast Cancer - hx of right mastectomy; followed-up by Dr. Earleen Reaper Multiple Myeloma - accdg to pt. she has not been taking Revlimid for 2  months due to heart issues; will hold Revlimid and Famciclovir and notify Dr. Hillard Danker office Insomnia - continue Ambien PRN Chronic pain syndrome - start Fentanyl 20mg/hr 1 patch transdermally Q 72 hours.   CPT CODE: 925189 MSeth Bake- NP [phone]

## 2014-02-07 ENCOUNTER — Non-Acute Institutional Stay (SKILLED_NURSING_FACILITY): Payer: Medicare Other | Admitting: Internal Medicine

## 2014-02-07 DIAGNOSIS — N183 Chronic kidney disease, stage 3 unspecified: Secondary | ICD-10-CM | POA: Diagnosis not present

## 2014-02-07 DIAGNOSIS — I5023 Acute on chronic systolic (congestive) heart failure: Secondary | ICD-10-CM | POA: Diagnosis not present

## 2014-02-07 DIAGNOSIS — C50919 Malignant neoplasm of unspecified site of unspecified female breast: Secondary | ICD-10-CM | POA: Diagnosis not present

## 2014-02-07 DIAGNOSIS — C773 Secondary and unspecified malignant neoplasm of axilla and upper limb lymph nodes: Secondary | ICD-10-CM

## 2014-02-07 DIAGNOSIS — C9000 Multiple myeloma not having achieved remission: Secondary | ICD-10-CM | POA: Diagnosis not present

## 2014-02-08 ENCOUNTER — Other Ambulatory Visit: Payer: Self-pay | Admitting: Hematology & Oncology

## 2014-02-09 ENCOUNTER — Encounter: Payer: Self-pay | Admitting: Adult Health

## 2014-02-09 ENCOUNTER — Non-Acute Institutional Stay (SKILLED_NURSING_FACILITY): Payer: Medicare Other | Admitting: Adult Health

## 2014-02-09 DIAGNOSIS — I5022 Chronic systolic (congestive) heart failure: Secondary | ICD-10-CM | POA: Diagnosis not present

## 2014-02-09 NOTE — Progress Notes (Signed)
Patient ID: Jacqueline Rivas, female   DOB: 08-29-1932, 80 y.o.   MRN: [id number]         PROGRESS NOTE  DATE: 02/09/14  FACILITY: Nursing Home Location: [facility]  LEVEL OF CARE: SNF (31)  Acute Visit  CHIEF COMPLAINT:  Medication Management for Chronic systolic heart failure  HISTORY OF PRESENT ILLNESS: This is a 80 year old female who has the digoxin level  0.2 - low. She is taking Digoxin for  Chronic systolic heart failure. No chest pain nor SOB.     PAST MEDICAL HISTORY : Reviewed.  No changes/see problem list  CURRENT MEDICATIONS: Reviewed per MAR/see medication list  REVIEW OF SYSTEMS:  GENERAL: no change in appetite, no fatigue, no weight changes, no fever, chills or weakness RESPIRATORY: no cough, SOB, DOE, wheezing, hemoptysis CARDIAC: no chest pain,or palpitations, +edema GI: no abdominal pain, diarrhea, constipation, heart burn, nausea or vomiting  PHYSICAL EXAMINATION  GENERAL: no acute distress, normal body habitus NECK: supple, trachea midline, no neck masses, no thyroid tenderness, no thyromegaly LYMPHATICS: no LAN in the neck, no supraclavicular LAN RESPIRATORY: breathing is even & unlabored, BS CTAB CARDIAC: RRR, no extra heart sounds, BLE edema 3+ GI: abdomen soft, normal BS, no masses, no tenderness, no hepatomegaly, no splenomegaly EXTREMITIES:  Able to move all 4 extremities PSYCHIATRIC: the patient is alert & oriented to person, affect & behavior appropriate  LABS/RADIOLOGY: 02/06/14 sodium 135 potassium 3.8 glucose 89 BUN 33 creatinine 1.4 calcium 9.4 digoxin 0.2 Labs reviewed: Basic Metabolic Panel:  Recent Labs  01/19/14 0355 01/20/14 0520  01/22/14 0457 01/29/14 1700  02/03/14 0500 02/04/14 0450 02/05/14 0500  NA 128* 134*  < > 137 136*  < > 140 139 136*  K 4.1 3.4*  < > 4.2 5.7*  < > 4.0 3.8 3.9  CL 90* 93*  < > 99 100  < > 100 100 96  CO2 23 25  < > 28 20  < > 28 29 27   GLUCOSE 145* 140*  < > 117* 170*  < > 108* 96  92  BUN 55* 50*  < > 31* 37*  < > 29* 33* 32*  CREATININE 1.58* 1.59*  < > 1.21* 1.37*  < > 1.30* 1.51* 1.35*  CALCIUM 9.1 9.5  < > 9.5 10.1  < > 9.9 9.7 9.6  MG  --   --   --   --  2.0  --   --   --   --   PHOS  --  3.3  --  2.3  --   --   --   --   --   < > = values in this interval not displayed. Liver Function Tests:  Recent Labs  01/19/14 0355  01/21/14 0500 01/22/14 0457 01/29/14 1700  AST 46*  --  28  --  39*  ALT 59*  --  37*  --  59*  ALKPHOS 127*  --  124*  --  112  BILITOT 0.4  --  0.5  --  1.0  PROT 6.4  --  6.5  --  7.1  ALBUMIN 2.9*  < > 2.8* 2.8* 3.2*  < > = values in this interval not displayed.   CBC:  Recent Labs  12/21/13 1045 01/16/14 1615  01/21/14 0500 01/22/14 0500 01/29/14 1700  WBC 4.1 5.9  < > 4.7 5.6 6.1  NEUTROABS 3.5 4.7  --   --   --  4.4  HGB  9.3* 9.0*  < > 10.8* 11.0* 12.8  HCT 29.5* 27.4*  < > 32.9* 34.8* 39.4  MCV 101 97.2  < > 97.3 100.9* 102.3*  PLT 146 228  < > 168 188 197  < > = values in this interval not displayed.  Cardiac Enzymes:  Recent Labs  01/16/14 2015 01/17/14 0117 01/17/14 0819  TROPONINI <0.30 <0.30 <0.30    ASSESSMENT/PLAN:  Chronic systolic heart failure -  increased digoxin to 125 mcg 1 tab by mouth daily; digoxin level in one week; BMP in one week CKD, stage 3 -  BMP in 1 week   CPT CODE: 78676   Marveen Donlon Vargas - NP [facility] [phone]

## 2014-02-10 NOTE — Progress Notes (Signed)
HISTORY & PHYSICAL  DATE: 02/07/2014   FACILITY: [facility]  LEVEL OF CARE: SNF (31)  ALLERGIES:  No Known Allergies  CHIEF COMPLAINT:  Manage CHF, chronic kidney disease stage III and multiple myeloma  HISTORY OF PRESENT ILLNESS: Patient is a 80 year old Caucasian female who was hospitalized secondary to CHF exacerbation and then admitted to this facility for short-term rehabilitation.  CHF:The patient does not relate significant weight changes, denies sob, DOE, orthopnea, PNDs, palpitations or chest pain.  CHF remains stable.  No complications form the medications being used. Complains of chronic lower extremity swelling. EF 20-25% in 2-15.  CHRONIC KIDNEY DISEASE: The patient's chronic kidney disease remains stable.  Patient denies increasing lower extremity swelling or confusion. Last BUN and creatinine are: 32, 1.35.  MULTIPLE MYELOMA: Patient was diagnosed with multiple myeloma in February of 2014 and was treated the chemotherapy and radiation. She is followed by her oncologist. She denies any bone pain currently.   PAST MEDICAL HISTORY :  Past Medical History  Diagnosis Date  . Diverticulosis   . Hiatal hernia   . Hyperlipidemia   . Hypertension   . Breast mass   . Multiple myeloma   . Breast cancer   . Arthritis   . S/P radiation therapy 12/15/12 - 12/21/12    T6 - L1 / 18.75 Gy / 5 Fractions  . Bilateral atelectasis   . Systolic heart failure     EF 20% 11/2013  . Protein-calorie malnutrition, severe 01/20/2014  . Chronic kidney disease     stage 3    PAST SURGICAL HISTORY: Past Surgical History  Procedure Laterality Date  . Tonsillectomy    . Modified mastectomy Right 01/25/2013    Procedure: MODIFIED MASTECTOMY;  Surgeon: Edward Jolly, MD;  Location: [place];  Service: General;  Laterality: Right;    SOCIAL HISTORY:  reports that she has never smoked. She has never used smokeless tobacco. She reports that she does not drink  alcohol or use illicit drugs.  FAMILY HISTORY:  Family History  Problem Relation Age of Onset  . Prostate cancer Father   . Heart attack Brother   . Asthma Maternal Grandmother   . Multiple sclerosis Sister   . Lymphoma Son     large B-cell  . Heart attack Father     CURRENT MEDICATIONS: Reviewed per MAR/see medication list  REVIEW OF SYSTEMS:  See HPI otherwise 14 point ROS is negative.  PHYSICAL EXAMINATION  VS:  See VS section  GENERAL: no acute distress, normal body habitus EYES: conjunctivae normal, sclerae normal, normal eye lids MOUTH/THROAT: lips without lesions,no lesions in the mouth,tongue is without lesions,uvula elevates in midline NECK: supple, trachea midline, no neck masses, no thyroid tenderness, no thyromegaly LYMPHATICS: no LAN in the neck, no supraclavicular LAN RESPIRATORY: breathing is even & unlabored, BS CTAB CARDIAC: RRR, no murmur,no extra heart sounds, +2 bilateral lower extremity edema GI:  ABDOMEN: abdomen soft, normal BS, no masses, no tenderness  LIVER/SPLEEN: no hepatomegaly, no splenomegaly MUSCULOSKELETAL: HEAD: normal to inspection & palpation BACK: no kyphosis, scoliosis or spinal processes tenderness EXTREMITIES: LEFT UPPER EXTREMITY: full range of motion, normal strength & tone RIGHT UPPER EXTREMITY:  full range of motion, normal strength & tone LEFT LOWER EXTREMITY:  full range of motion, normal strength & tone RIGHT LOWER EXTREMITY:  full range of motion, normal strength & tone PSYCHIATRIC: the patient is alert & oriented to person, affect & behavior appropriate  LABS/RADIOLOGY:  Labs reviewed: Basic Metabolic Panel:  Recent Labs  01/19/14 0355 01/20/14 0520  01/22/14 0457 01/29/14 1700  02/03/14 0500 02/04/14 0450 02/05/14 0500  NA 128* 134*  < > 137 136*  < > 140 139 136*  K 4.1 3.4*  < > 4.2 5.7*  < > 4.0 3.8 3.9  CL 90* 93*  < > 99 100  < > 100 100 96  CO2 23 25  < > 28 20  < > 28 29 27   GLUCOSE 145* 140*  < >  117* 170*  < > 108* 96 92  BUN 55* 50*  < > 31* 37*  < > 29* 33* 32*  CREATININE 1.58* 1.59*  < > 1.21* 1.37*  < > 1.30* 1.51* 1.35*  CALCIUM 9.1 9.5  < > 9.5 10.1  < > 9.9 9.7 9.6  MG  --   --   --   --  2.0  --   --   --   --   PHOS  --  3.3  --  2.3  --   --   --   --   --   < > = values in this interval not displayed. Liver Function Tests:  Recent Labs  01/19/14 0355  01/21/14 0500 01/22/14 0457 01/29/14 1700  AST 46*  --  28  --  39*  ALT 59*  --  37*  --  59*  ALKPHOS 127*  --  124*  --  112  BILITOT 0.4  --  0.5  --  1.0  PROT 6.4  --  6.5  --  7.1  ALBUMIN 2.9*  < > 2.8* 2.8* 3.2*  < > = values in this interval not displayed.  CBC:  Recent Labs  12/21/13 1045 01/16/14 1615  01/21/14 0500 01/22/14 0500 01/29/14 1700  WBC 4.1 5.9  < > 4.7 5.6 6.1  NEUTROABS 3.5 4.7  --   --   --  4.4  HGB 9.3* 9.0*  < > 10.8* 11.0* 12.8  HCT 29.5* 27.4*  < > 32.9* 34.8* 39.4  MCV 101 97.2  < > 97.3 100.9* 102.3*  PLT 146 228  < > 168 188 197  < > = values in this interval not displayed.  Cardiac Enzymes:  Recent Labs  01/16/14 2015 01/17/14 0117 01/17/14 0819  TROPONINI <0.30 <0.30 <0.30    Transthoracic Echocardiography  Patient:    Merle, Whitehorn MR #:       [id number] Study Date: 01/17/2014 Gender:     F Age:        65 Height:     160cm Weight:     59.1kg BSA:        1.23m2 Pt. Status: Room:       1Olanta Jai-Gurmukh  SONOGRAPHER  [facility]  ADMITTING    Short, MLanice Schwab    Short, Mackenzie  PERFORMING   Chmg, Inpatient cc:  ------------------------------------------------------------ LV EF: 20%  ------------------------------------------------------------ Indications:      CHF - 428.0.  ------------------------------------------------------------ History:   Risk factors:  Hypertension. Dyslipidemia.  ------------------------------------------------------------ Study Conclusions  - Left ventricle:  The cavity size was moderately dilated.   There was mild concentric hypertrophy. Systolic function   was normal. The estimated ejection fraction was 20%. Wall   motion was normal; there were no regional wall motion   abnormalities. Doppler parameters are consistent with   abnormal left  ventricular relaxation (grade 1 diastolic   dysfunction). Doppler parameters are consistent with   elevated ventricular end-diastolic filling pressure. - Aortic valve: Trileaflet; normal thickness leaflets. Mild   regurgitation. - Aortic root: The aortic root was normal in size. - Mitral valve: Structurally normal valve. Transvalvular   velocity was within the normal range. There was no   evidence for stenosis. Moderate regurgitation directed   posteriorly. - Right ventricle: The cavity size was mildly dilated. Wall   thickness was normal. Systolic function was normal. - Right atrium: The atrium was normal in size. - Tricuspid valve: Moderate regurgitation. - Pulmonic valve: Trivial regurgitation. - Pulmonary arteries: Systolic pressure was within the   normal range. - Inferior vena cava: The vessel was normal in size. Transthoracic echocardiography.  M-mode, complete 2D, spectral Doppler, and color Doppler.  Height:  Height: 160cm. Height: 63in.  Weight:  Weight: 59.1kg. Weight: 130lb.  Body mass index:  BMI: 23.1kg/m^2.  Body surface area:    BSA: 1.57m2.  Blood pressure:     114/80.  Patient status:  Inpatient.  Location:  Bedside.  ------------------------------------------------------------  ------------------------------------------------------------ Left ventricle:  The cavity size was moderately dilated. There was mild concentric hypertrophy. Systolic function was normal. The estimated ejection fraction was 20%. Wall motion was normal; there were no regional wall motion abnormalities. Doppler parameters are consistent with abnormal left ventricular relaxation (grade 1  diastolic dysfunction). Doppler parameters are consistent with elevated ventricular end-diastolic filling pressure.  ------------------------------------------------------------ Aortic valve:   Trileaflet; normal thickness leaflets. Mobility was not restricted.  Doppler:  Transvalvular velocity was within the normal range. There was no stenosis.  Mild regurgitation.  ------------------------------------------------------------ Aorta:  Aortic root: The aortic root was normal in size.  ------------------------------------------------------------ Mitral valve:   Structurally normal valve.   Mobility was not restricted.  Doppler:  Transvalvular velocity was within the normal range. There was no evidence for stenosis. Moderate regurgitation directed posteriorly.    Peak gradient: 363mHg (D).  ------------------------------------------------------------ Left atrium:  The atrium was normal in size.  ------------------------------------------------------------ Right ventricle:  The cavity size was mildly dilated. Wall thickness was normal. Systolic function was normal.  ------------------------------------------------------------ Pulmonic valve:    Structurally normal valve.   Cusp separation was normal.  Doppler:  Transvalvular velocity was within the normal range. There was no evidence for stenosis.  Trivial regurgitation.  ------------------------------------------------------------ Tricuspid valve:   Structurally normal valve.    Doppler: Transvalvular velocity was within the normal range. Moderate regurgitation.  ------------------------------------------------------------ Pulmonary artery:   The main pulmonary artery was normal-sized. Systolic pressure was within the normal range.   ------------------------------------------------------------ Right atrium:  The atrium was normal in size.  ------------------------------------------------------------ Pericardium:  There was  no pericardial effusion.  ------------------------------------------------------------ Systemic veins: Inferior vena cava: The vessel was normal in size.  ------------------------------------------------------------  2D measurements        Normal  Doppler               Normal Left ventricle                 measurements LVID ED,   59.2 mm     43-52   Left ventricle chord,                         Ea, lat         5 cm/ ------- PLAX  ann, tiss         s LVID ES,   53.7 mm     23-38   DP chord,                         E/Ea, lat   17.84     ------- PLAX                           ann, tiss FS, chord,    9 %      >29     DP PLAX                           Aortic valve LVPW, ED   12.9 mm     ------  Regurg PHT    544 ms  ------- IVS/LVPW   0.97        <1.3    Mitral valve ratio, ED                      Peak E vel   89.2 cm/ ------- Ventricular septum                               s IVS, ED    12.5 mm     ------  Peak A vel     82 cm/ ------- Aorta                                            s Root diam,   31 mm     ------  Deceleratio   127 ms  150-230 ED                             n time Left atrium                    Peak            3 mm  ------- AP dim       47 mm     ------  gradient, D       Hg AP dim     2.89 cm/m^2 <2.2    Peak E/A      1.1     ------- index                          ratio                                Tricuspid valve                                Regurg peak   267 cm/ -------                                vel               s  Peak RV-RA     29 mm  -------                                gradient, S       Hg                                Max regurg    267 cm/ -------                                vel               s                                Right ventricle                                Sa vel, lat  6.42 cm/ -------                                ann, tiss         s                                 DP   PORTABLE CHEST - 1 VIEW   COMPARISON:  01/18/2014 and 01/16/2014   FINDINGS: Stable enlargement of the cardiac silhouette. The trachea is midline. Improved aeration at the right lung base may represent decreased right pleural fluid. Persistent densities at the left lung base are suggestive for atelectasis and probable small effusion. Patient has had a vertebral augmentation procedure in the lower thoracic spine. Upper lungs are clear without overt pulmonary edema.   IMPRESSION: Improved aeration at the right lung base may represent decreased pleural fluid.   Persistent densities at the left lung base probably represent a combination of atelectasis and small effusion.   Cardiomegaly.   ASSESSMENT/PLAN:  CHF-well compensated Chronic kidney disease stage III-reassess Multiple myeloma-status post chemotherapy and radiation. Breast cancer-followed by the oncologist Macrocytosis-new problem check RBC folate and vitamin B12 level GERD-continue PPI Hypokalemia-continue supplementation  I have reviewed patient's medical records received at admission/from hospitalization.  CPT CODE: 62263  Tayshaun Kroh Y Neomia Herbel, [place] [phone]

## 2014-02-12 ENCOUNTER — Ambulatory Visit (HOSPITAL_COMMUNITY)
Admission: RE | Admit: 2014-02-12 | Discharge: 2014-02-12 | Disposition: A | Discharge status: Home / Self Care | Payer: No Typology Code available for payment source | Source: Ambulatory Visit | Attending: Cardiology | Admitting: Cardiology

## 2014-02-12 ENCOUNTER — Encounter (HOSPITAL_COMMUNITY): Payer: Medicare Other

## 2014-02-12 ENCOUNTER — Encounter (HOSPITAL_COMMUNITY): Payer: Self-pay

## 2014-02-12 VITALS — BP 106/58 | HR 96 | Wt 122.8 lb

## 2014-02-12 DIAGNOSIS — G894 Chronic pain syndrome: Secondary | ICD-10-CM | POA: Diagnosis not present

## 2014-02-12 DIAGNOSIS — N183 Chronic kidney disease, stage 3 unspecified: Secondary | ICD-10-CM | POA: Insufficient documentation

## 2014-02-12 DIAGNOSIS — N189 Chronic kidney disease, unspecified: Secondary | ICD-10-CM | POA: Diagnosis not present

## 2014-02-12 DIAGNOSIS — I5023 Acute on chronic systolic (congestive) heart failure: Secondary | ICD-10-CM

## 2014-02-12 LAB — BASIC METABOLIC PANEL
BUN: 26 mg/dL — AB (ref 6–23)
CO2: 25 meq/L (ref 19–32)
Calcium: 10 mg/dL (ref 8.4–10.5)
Chloride: 100 mEq/L (ref 96–112)
Creatinine, Ser: 1.38 mg/dL — ABNORMAL HIGH (ref 0.50–1.10)
GFR calc Af Amer: 41 mL/min — ABNORMAL LOW (ref >90)
GFR, EST NON AFRICAN AMERICAN: 35 mL/min — AB (ref >90)
GLUCOSE: 94 mg/dL (ref 70–99)
Potassium: 4 mEq/L (ref 3.7–5.3)
SODIUM: 141 meq/L (ref 137–147)

## 2014-02-12 LAB — PRO B NATRIURETIC PEPTIDE: Pro B Natriuretic peptide (BNP): 3382 pg/mL — ABNORMAL HIGH (ref 0–450)

## 2014-02-12 MED ORDER — LISINOPRIL 2.5 MG PO TABS: 2.5000 mg | ORAL_TABLET | Freq: Every evening | ORAL | Status: DC

## 2014-02-12 NOTE — Progress Notes (Signed)
Patient ID: Jacqueline Rivas   DOB: 09/08/1932, 80 y.o.   MRN: [id number] Oncologist: Dr Marin Olp  HPI: Jacqueline Rivas is a 80 y.o. Rivas with a history of multiple myeloma (diagnosed 11/2012 - treated with XRT/chemo), breast CA, CKD and recent diagnosis of systolic HF.   Apparently her EF was normal in September 2014. She was on Herceptin for breast CA with Dr. Marin Olp for 1 year, starting in June 2014. In February, she had an echo which showed an EF of 20-25%. The Herceptin was discontinued. She was admitted last week to [facility] for volume overload. She was diuresed. Discharged to [facility] on 01/22/14. Weight ~127. Discharged on lasix 52m daily. In hospital diuresis was limited by low BP. Incidental finding of lives mass - ? Mets.  She had echo 01/17/14: LVEF 20% RV mildly dilated. Moderate TR.   She was admitted later in in 4/15 from clinic with volume overload.  She was started on milrinone and Lasix gtts with good diuresis.  BP was on the low side so she was not started on beta blocker or ACEI. She is now at [name] is doing better. She walks with her walker without significant exertional dyspnea going short distances.  She tires easily.  Mild lightheadedness with standing if she gets up too quickly.  She has been doing physical therapy.  She is wearing compression stockings.   Labs (4/15): Na 137, K 4.2 => 3.9, Cr 1.27 => 1.35  Review of Systems: All systems reviewed and negative except as per HPI.   Past Medical History  Diagnosis Date  . Diverticulosis   . Hiatal hernia   . Hyperlipidemia   . Hypertension   . Breast mass   . Multiple myeloma   . Breast cancer   . Arthritis   . S/P radiation therapy 12/15/12 - 12/21/12    T6 - L1 / 18.75 Gy / 5 Fractions  . Bilateral atelectasis   . Systolic heart failure     EF 20% 11/2013  . Protein-calorie malnutrition, severe 01/20/2014  . Chronic kidney disease     stage 3    Current Outpatient Prescriptions  Medication Sig  Dispense Refill  . calcium carbonate (OS-CAL) 600 MG TABS tablet Take 1,200 mg by mouth daily with breakfast.      . Coenzyme Q10 (CO Q 10) 100 MG CAPS Take 100 mg by mouth every morning.       . digoxin 62.5 MCG TABS Take 0.0625 mg by mouth daily.  30 tablet  6  . HYDROmorphone (DILAUDID) 2 MG tablet Take 2 mg by mouth every 6 (six) hours as needed (pain).       .Marland KitchenHYDROmorphone (DILAUDID) 2 MG tablet Take 1 tablet (2 mg total) by mouth every 6 (six) hours as needed for severe pain.  30 tablet  0  . omeprazole (PRILOSEC) 20 MG capsule Take 20 mg by mouth every morning.      . potassium chloride SA (K-DUR,KLOR-CON) 20 MEQ tablet Take 20 mEq by mouth daily.      .Marland Kitchenspironolactone (ALDACTONE) 25 MG tablet Take 1 tablet (25 mg total) by mouth daily.  30 tablet  6  . torsemide (DEMADEX) 20 MG tablet Take 1 tablet (20 mg total) by mouth once.  30 tablet  6  . zolpidem (AMBIEN) 5 MG tablet Take 5 mg by mouth at bedtime as needed for sleep.      .Marland Kitchenlisinopril (PRINIVIL,ZESTRIL) 2.5 MG tablet Take 1 tablet (2.5 mg  total) by mouth every evening.  30 tablet  6   No current facility-administered medications for this encounter.    No Known Allergies  History   Social History  . Marital Status: Widowed    Spouse Name: N/A    Number of Children: N/A  . Years of Education: N/A   Occupational History  . Retired    Social History Main Topics  . Smoking status: Never Smoker   . Smokeless tobacco: Never Used     Comment: never used product  . Alcohol Use: No     Comment: heavy alcohol use until 2013  . Drug Use: No  . Sexual Activity: No   Other Topics Concern  . Not on file   Social History Narrative   Has apartment, lives alone.  Uses a cane around apartment.  Has walker. Drives.      Family History  Problem Relation Age of Onset  . Prostate cancer Father   . Heart attack Brother   . Asthma Maternal Grandmother   . Multiple sclerosis Sister   . Lymphoma Son     large B-cell  . Heart  attack Father     PHYSICAL EXAM: Filed Vitals:   02/12/14 1022  BP: 106/58  Pulse: 96  Weight: 122 lb 12.8 oz (55.702 kg)  SpO2: 95%    General:  Chronically ill appearing. Frail.   HEENT: normal Neck: supple. JVP 7 cm. Carotids 2+ bilat; no bruits. No lymphadenopathy or thyromegaly appreciated. Cor: PMI laterally displaced. Regular rate & rhythm. No S3. 2/6 TR murmur. Lungs: decreased at bases Abdomen: soft, nontender, + distended. No hepatosplenomegaly. No bruits or masses. Good bowel sounds. Extremities: no cyanosis, clubbing, rash.  No edema.  She is wearing compression stockings.  Neuro: alert & oriented x 3, cranial nerves grossly intact. moves all 4 extremities w/o difficulty. Affect pleasant.  ASSESSMENT & PLAN: 1. Acute on chronic systolic HF:  Suspect nonischemic cardiomyopathy possibly due to Herceptin use, EF 20%.  Recent admission with significant volume overload.  She required milrinone and was diuresed.  She was titrated off milrinone but has had low BP and has not been started on ACEI or beta blocker yet.  She is doing well overall and looks near-euvolemic today. NYHA class II symptoms.  She has generalized weakness likely due to deconditioning but does not have prominent dyspnea.   - Continue digoxin and spironolactone at current doses.  - Continue current dose of torsemide. - Check BMET/BNP/digoxin level today.  - BP running a bit higher, will add lisinopril 2.5 qhs.  - BMET in 7 days.  - Will try to advance CHF meds over the next few months and repeat echo in 3-4 months to see if function is improving.  2. CKD: Stable, follow creatinine carefully.  3. Breast CA: Per Dr Marin Olp, would avoid Herceptin in future.  4. Multiple myeloma: Per Dr. Marin Olp.  5. DNR/DNI  Followup 2 wks.   Jacqueline Rivas 02/12/2014

## 2014-02-12 NOTE — Patient Instructions (Signed)
START Lisinopril 2.5 mg every PM  Labs today and again in 7 days (BMET)  Your physician recommends that you schedule a follow-up appointment in: 2 weeks  Do the following things EVERYDAY: 1) Weigh yourself in the morning before breakfast. Write it down and keep it in a log. 2) Take your medicines as prescribed 3) Eat low salt foods-Limit salt (sodium) to 2000 mg per day.  4) Stay as active as you can everyday 5) Limit all fluids for the day to less than 2 liters 6)

## 2014-02-14 NOTE — Telephone Encounter (Signed)
No encounter needed

## 2014-02-15 ENCOUNTER — Ambulatory Visit (HOSPITAL_BASED_OUTPATIENT_CLINIC_OR_DEPARTMENT_OTHER): Payer: Medicare Other

## 2014-02-15 ENCOUNTER — Encounter: Payer: Self-pay | Admitting: Hematology & Oncology

## 2014-02-15 ENCOUNTER — Ambulatory Visit (HOSPITAL_BASED_OUTPATIENT_CLINIC_OR_DEPARTMENT_OTHER): Payer: Medicare Other | Admitting: Lab

## 2014-02-15 ENCOUNTER — Ambulatory Visit (HOSPITAL_BASED_OUTPATIENT_CLINIC_OR_DEPARTMENT_OTHER): Payer: Medicare Other | Admitting: Hematology & Oncology

## 2014-02-15 VITALS — BP 94/50 | HR 91 | Temp 97.8°F | Resp 14 | Ht 62.0 in | Wt 124.0 lb

## 2014-02-15 DIAGNOSIS — C50919 Malignant neoplasm of unspecified site of unspecified female breast: Secondary | ICD-10-CM

## 2014-02-15 DIAGNOSIS — C773 Secondary and unspecified malignant neoplasm of axilla and upper limb lymph nodes: Secondary | ICD-10-CM

## 2014-02-15 DIAGNOSIS — Z170 Estrogen receptor positive status [ER+]: Secondary | ICD-10-CM | POA: Diagnosis not present

## 2014-02-15 DIAGNOSIS — C9000 Multiple myeloma not having achieved remission: Secondary | ICD-10-CM

## 2014-02-15 LAB — CBC WITH DIFFERENTIAL (CANCER CENTER ONLY)
BASO#: 0.1 10*3/uL (ref 0.0–0.2)
BASO%: 1.7 % (ref 0.0–2.0)
EOS%: 0.5 % (ref 0.0–7.0)
Eosinophils Absolute: 0 10*3/uL (ref 0.0–0.5)
HCT: 37.1 % (ref 34.8–46.6)
HGB: 11.7 g/dL (ref 11.6–15.9)
LYMPH#: 0.6 10*3/uL — AB (ref 0.9–3.3)
LYMPH%: 14.3 % (ref 14.0–48.0)
MCH: 33.1 pg (ref 26.0–34.0)
MCHC: 31.5 g/dL — AB (ref 32.0–36.0)
MCV: 105 fL — ABNORMAL HIGH (ref 81–101)
MONO#: 0.4 10*3/uL (ref 0.1–0.9)
MONO%: 9.7 % (ref 0.0–13.0)
NEUT%: 73.8 % (ref 39.6–80.0)
NEUTROS ABS: 3 10*3/uL (ref 1.5–6.5)
PLATELETS: 180 10*3/uL (ref 145–400)
RBC: 3.53 10*6/uL — ABNORMAL LOW (ref 3.70–5.32)
RDW: 19.7 % — AB (ref 11.1–15.7)
WBC: 4.1 10*3/uL (ref 3.9–10.0)

## 2014-02-15 LAB — CMP (CANCER CENTER ONLY)
ALBUMIN: 3.2 g/dL — AB (ref 3.3–5.5)
ALK PHOS: 73 U/L (ref 26–84)
ALT: 23 U/L (ref 10–47)
AST: 29 U/L (ref 11–38)
BUN: 29 mg/dL — AB (ref 7–22)
CO2: 31 mEq/L (ref 18–33)
Calcium: 10 mg/dL (ref 8.0–10.3)
Chloride: 98 mEq/L (ref 98–108)
Creat: 1.9 mg/dl — ABNORMAL HIGH (ref 0.6–1.2)
Glucose, Bld: 113 mg/dL (ref 73–118)
POTASSIUM: 3.9 meq/L (ref 3.3–4.7)
SODIUM: 142 meq/L (ref 128–145)
TOTAL PROTEIN: 8 g/dL (ref 6.4–8.1)
Total Bilirubin: 1 mg/dl (ref 0.20–1.60)

## 2014-02-15 MED ORDER — SODIUM CHLORIDE 0.9 % IJ SOLN
3.0000 mL | Freq: Once | INTRAMUSCULAR | Status: DC | PRN
  Filled 2014-02-15: qty 10

## 2014-02-15 MED ORDER — DEXAMETHASONE SODIUM PHOSPHATE 20 MG/5ML IJ SOLN: 40.0000 mg | Freq: Once | INTRAMUSCULAR | Status: DC

## 2014-02-15 MED ORDER — ZOLEDRONIC ACID 4 MG/5ML IV CONC
3.0000 mg | Freq: Once | INTRAVENOUS | Status: AC
  Administered 2014-02-15: 3 mg via INTRAVENOUS
  Filled 2014-02-15: qty 3.75

## 2014-02-15 MED ORDER — SODIUM CHLORIDE 0.9 % IV SOLN
Freq: Once | INTRAVENOUS | Status: AC
  Administered 2014-02-15: 11:00:00 via INTRAVENOUS

## 2014-02-15 MED ORDER — ALTEPLASE 2 MG IJ SOLR
2.0000 mg | Freq: Once | INTRAMUSCULAR | Status: DC | PRN
  Filled 2014-02-15: qty 2

## 2014-02-15 MED ORDER — HEPARIN SOD (PORK) LOCK FLUSH 100 UNIT/ML IV SOLN
250.0000 [IU] | Freq: Once | INTRAVENOUS | Status: DC | PRN
  Filled 2014-02-15: qty 5

## 2014-02-15 MED ORDER — DEXAMETHASONE SODIUM PHOSPHATE 20 MG/5ML IJ SOLN
20.0000 mg | Freq: Once | INTRAMUSCULAR | Status: AC
  Administered 2014-02-15: 20 mg via INTRAVENOUS

## 2014-02-15 MED ORDER — HEPARIN SOD (PORK) LOCK FLUSH 100 UNIT/ML IV SOLN
500.0000 [IU] | Freq: Once | INTRAVENOUS | Status: AC | PRN
  Administered 2014-02-15: 500 [IU]
  Filled 2014-02-15: qty 5

## 2014-02-15 MED ORDER — SODIUM CHLORIDE 0.9 % IJ SOLN
10.0000 mL | INTRAMUSCULAR | Status: DC | PRN
  Administered 2014-02-15: 10 mL
  Filled 2014-02-15: qty 10

## 2014-02-15 MED ORDER — DEXAMETHASONE SODIUM PHOSPHATE 20 MG/5ML IJ SOLN
INTRAMUSCULAR | Status: AC
  Filled 2014-02-15: qty 10

## 2014-02-15 NOTE — Patient Instructions (Signed)

## 2014-02-16 ENCOUNTER — Telehealth: Payer: Self-pay | Admitting: Hematology & Oncology

## 2014-02-16 NOTE — Addendum Note (Signed)
Addended by: Trevor Mace on: 02/16/2014 02:35 PM   Modules accepted: Orders, Medications

## 2014-02-16 NOTE — Telephone Encounter (Signed)
LEFT MESSAGE WITH 5-29 APPOINTMENT

## 2014-02-16 NOTE — Progress Notes (Signed)
Hematology and Oncology Follow Up Visit  Jacqueline Rivas [id number] [birth year] 80 y.o. 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 This for a while since we saw her in the office. I have seen her in the hospital. She's been admitted because of congestive heart failure. She has an ejection fraction of 20%. The only possible etiologies I can see is the Herceptin. She was given this with Perjeta which should not increase the risk of cardiomyopathy.  She's been followed cardiology right now. She is on medications for her cardiac function.  She actually looks pretty good. Her appetite is improving. She is at an assisted living right now. This I, I think, is a great idea.  We had held the above limited for right now. We last saw her she was complaining of a lot of diarrhea. Again, with her, I just never knew what she was taking at home. However, in the diarrhea has not returned.  Jacqueline Rivas last saw her, her monoclonal spike was down to 1.06 g/dL. Her IgA level was 1580 mg/dL.  While she was in the hospital, she was given tons of tests.  Her last tumor marker-CA 27.29-in early April was 63.  Again she is eating better. She actually looks quite good. There is less swelling in her legs.   Currently, her performance status is ECoG 2-3   Medications: Current outpatient prescriptions:UNKNOWN TO PATIENT, 02-15-14  PT LIVING AT [facility],  REQUESTED A FAXED COPY OF MEDICATION LIST, Disp: , Rfl: ;  calcium carbonate (OS-CAL) 600 MG TABS tablet, Take 1,200 mg by mouth daily with  breakfast., Disp: , Rfl: ;  Coenzyme Q10 (CO Q 10) 100 MG CAPS, Take 100 mg by mouth every morning. , Disp: , Rfl: ;  digoxin 62.5 MCG TABS, Take 0.0625 mg by mouth daily., Disp: 30 tablet, Rfl: 6 diphenoxylate-atropine (LOMOTIL) 2.5-0.025 MG per tablet, , Disp: , Rfl: ;  famciclovir (FAMVIR) 500 MG tablet, , Disp: , Rfl: ;  HYDROmorphone (DILAUDID) 2 MG tablet, Take 2 mg by mouth every 6 (six) hours as needed (pain). , Disp: , Rfl: ;  HYDROmorphone (DILAUDID) 2 MG tablet, Take 1 tablet (2 mg total) by mouth every 6 (six) hours as needed for severe pain., Disp: 30 tablet, Rfl: 0 lisinopril (PRINIVIL,ZESTRIL) 2.5 MG tablet, Take 1 tablet (2.5 mg total) by mouth every evening., Disp: 30 tablet, Rfl: 6;  omeprazole (PRILOSEC) 20 MG capsule, Take 20 mg by mouth every morning., Disp: , Rfl: ;  potassium chloride SA (K-DUR,KLOR-CON) 20 MEQ tablet, Take 20 mEq by mouth daily., Disp: , Rfl: ;  REVLIMID 20 MG CAPS, , Disp: , Rfl:  spironolactone (ALDACTONE) 25 MG tablet, Take 1 tablet (25 mg total) by mouth daily., Disp: 30 tablet, Rfl: 6;  torsemide (DEMADEX) 20 MG tablet, Take 1 tablet (20 mg total) by mouth once., Disp: 30 tablet, Rfl: 6;  zolpidem (AMBIEN) 5 MG tablet, Take 5 mg by mouth at bedtime as needed for sleep., Disp: , Rfl:   Allergies: No Known  Allergies  Past Medical History, Surgical history, Social history, and Family History were reviewed and updated.  Review of Systems: As above  Physical Exam:  height is _0  (1.575 m) and weight is 124 lb (56.246 kg). Her oral temperature is 97.8 F (36.6 C). Her blood pressure is 94/50 and her pulse is 91. Her respiration is 14.   Elderly, thin white female. She is alert. Her head exam shows no ocular or oral lesion. She has some temporal muscle wasting. There is no adenopathy in her neck. Lungs are clear. Cardiac exam regular rate and rhythm. She has an occasional extra beat. There is a 1/6 systolic murmur. Exam shows leprose with no masses. There is  no left axillary adenopathy. Right chest wall shows a well-healed mastectomy. There is no right axillary lymph nodes. Abdomen soft. She is slightly distended. This is chronic. She has no fluid wave. There is no palpable liver or spleen. Back exam shows some kyphosis. She has some generalized muscle atrophy on her back. Extremities shows 1+ edema in her lower legs. She has age related arthritic changes. She has 4/5 strength in her legs. Skin exam shows no rashes. Neurological exam is nonfocal.  Lab Results  Component Value Date   WBC 4.1 02/15/2014   HGB 11.7 02/15/2014   HCT 37.1 02/15/2014   MCV 105* 02/15/2014   PLT 180 02/15/2014     Chemistry      Component Value Date/Time   NA 142 02/15/2014 0918   NA 141 02/12/2014 1130   K 3.9 02/15/2014 0918   K 4.0 02/12/2014 1130   CL 98 02/15/2014 0918   CL 100 02/12/2014 1130   CO2 31 02/15/2014 0918   CO2 25 02/12/2014 1130   BUN 29* 02/15/2014 0918   BUN 26* 02/12/2014 1130   CREATININE 1.9* 02/15/2014 0918   CREATININE 1.38* 02/12/2014 1130      Component Value Date/Time   CALCIUM 10.0 02/15/2014 0918   CALCIUM 10.0 02/12/2014 1130   CALCIUM 11.1* 11/13/2012 1526   ALKPHOS 73 02/15/2014 0918   ALKPHOS 112 01/29/2014 1700   AST 29 02/15/2014 0918   AST 39* 01/29/2014 1700   ALT 23 02/15/2014 0918   ALT 59* 01/29/2014 1700   BILITOT 1.00 02/15/2014 0918   BILITOT 1.0 01/29/2014 1700     Her prealbumin is 29.5.  Her IgA level is 1950 mg/dL.    Impression and Plan: Ms. Kitner is a 5 her old white female. She has 2 malignancies. She had stage IIB breast cancer. She was ER negative /HER-2 positive. She was not a candidate for adjuvant chemotherapy because of her performance status. She also had myeloma. She is IgA kappa myeloma. She had bone involvement. She did have kyphoplasty done. She's had radiation therapy. Read by were able to get her on Revlimid. This was working very well. Again I don't know if his diarrhea was because her problem it or  not.  We will see what her monoclonal spike is now. I suspect that is going to be up. If so, we will get her back on Revlimid. I probably would put her on a 15 mg daily dose.  I see that her kidney function is down a little bit. She was started on an ACE inhibitor. This will have to be watched. Again she is being followed very closely by cardiology.  We will go ahead and give her Zometa. I do think this is important. I will give her a 3 mg  dose.  We will plan to get her back in one more month.  We will get in touch with her son when we have the other myeloma studies back.   [name], MD 5/1/20157:00 AM

## 2014-02-19 ENCOUNTER — Encounter: Payer: Self-pay | Admitting: Adult Health

## 2014-02-19 LAB — KAPPA/LAMBDA LIGHT CHAINS
KAPPA FREE LGHT CHN: 87.4 mg/dL — AB (ref 0.33–1.94)
KAPPA LAMBDA RATIO: 98.2 — AB (ref 0.26–1.65)
LAMBDA FREE LGHT CHN: 0.89 mg/dL (ref 0.57–2.63)

## 2014-02-19 LAB — PREALBUMIN: PREALBUMIN: 29.5 mg/dL (ref 17.0–34.0)

## 2014-02-19 LAB — PROTEIN ELECTROPHORESIS, SERUM, WITH REFLEX
Albumin ELP: 49.4 % — ABNORMAL LOW (ref 55.8–66.1)
Alpha-1-Globulin: 4.2 % (ref 2.9–4.9)
Alpha-2-Globulin: 10.6 % (ref 7.1–11.8)
BETA 2: 3.8 % (ref 3.2–6.5)
Beta Globulin: 6.5 % (ref 4.7–7.2)
GAMMA GLOBULIN: 25.5 % — AB (ref 11.1–18.8)
M-Spike, %: 0.18 g/dL
Total Protein, Serum Electrophoresis: 7.2 g/dL (ref 6.0–8.3)

## 2014-02-19 LAB — IGG, IGA, IGM
IGG (IMMUNOGLOBIN G), SERUM: 239 mg/dL — AB (ref 690–1700)
IgA: 1950 mg/dL — ABNORMAL HIGH (ref 69–380)
IgM, Serum: 21 mg/dL — ABNORMAL LOW (ref 52–322)

## 2014-02-19 LAB — IFE INTERPRETATION

## 2014-02-21 ENCOUNTER — Telehealth: Payer: Self-pay | Admitting: *Deleted

## 2014-02-21 NOTE — Telephone Encounter (Signed)
Son called yesterday asking for mothers myeloma protein studies.  Gave him results from 02/15/13.  Numbers not correlating with patients situation. Told him I would review with Dr. Marin Olp.  Dr. Marin Olp asked for labs to be repeated.  Spoke with Jackelyn Poling in the lab, sample still available, will re run labs to determine if we get same result. LEft this message on [facility] personal cell phone

## 2014-02-22 ENCOUNTER — Other Ambulatory Visit: Payer: Self-pay | Admitting: *Deleted

## 2014-02-22 NOTE — Telephone Encounter (Signed)
Erroneous encounter

## 2014-02-23 ENCOUNTER — Encounter: Payer: Self-pay | Admitting: Nurse Practitioner

## 2014-02-23 MED ORDER — LENALIDOMIDE 15 MG PO CAPS: 15.0000 mg | ORAL_CAPSULE | Freq: Every day | ORAL | Status: DC

## 2014-02-23 NOTE — Progress Notes (Signed)
Per Dr. Marin Olp pt is to be restarted on Revlimid. Script sent to [facility] and [employer] authorization etc done. Auth # [id number]. Spoke with Romilda Garret, her son and he is aware of the medication being restarted and will listen out for a call from pharmacy to schedule delivery.

## 2014-02-23 NOTE — Addendum Note (Signed)
Addended by: Burney Gauze R on: 02/23/2014 09:29 AM   Modules accepted: Orders, Medications

## 2014-02-24 DIAGNOSIS — I129 Hypertensive chronic kidney disease with stage 1 through stage 4 chronic kidney disease, or unspecified chronic kidney disease: Secondary | ICD-10-CM | POA: Diagnosis not present

## 2014-02-24 DIAGNOSIS — I5023 Acute on chronic systolic (congestive) heart failure: Secondary | ICD-10-CM | POA: Diagnosis not present

## 2014-02-24 DIAGNOSIS — N183 Chronic kidney disease, stage 3 unspecified: Secondary | ICD-10-CM | POA: Diagnosis not present

## 2014-02-24 DIAGNOSIS — Z853 Personal history of malignant neoplasm of breast: Secondary | ICD-10-CM | POA: Diagnosis not present

## 2014-02-24 DIAGNOSIS — D630 Anemia in neoplastic disease: Secondary | ICD-10-CM | POA: Diagnosis not present

## 2014-02-24 DIAGNOSIS — C9000 Multiple myeloma not having achieved remission: Secondary | ICD-10-CM | POA: Diagnosis not present

## 2014-02-26 DIAGNOSIS — D630 Anemia in neoplastic disease: Secondary | ICD-10-CM | POA: Diagnosis not present

## 2014-02-26 DIAGNOSIS — C9000 Multiple myeloma not having achieved remission: Secondary | ICD-10-CM | POA: Diagnosis not present

## 2014-02-26 DIAGNOSIS — I5023 Acute on chronic systolic (congestive) heart failure: Secondary | ICD-10-CM | POA: Diagnosis not present

## 2014-02-26 DIAGNOSIS — Z853 Personal history of malignant neoplasm of breast: Secondary | ICD-10-CM | POA: Diagnosis not present

## 2014-02-26 DIAGNOSIS — I129 Hypertensive chronic kidney disease with stage 1 through stage 4 chronic kidney disease, or unspecified chronic kidney disease: Secondary | ICD-10-CM | POA: Diagnosis not present

## 2014-02-26 DIAGNOSIS — N183 Chronic kidney disease, stage 3 unspecified: Secondary | ICD-10-CM | POA: Diagnosis not present

## 2014-02-27 ENCOUNTER — Ambulatory Visit (HOSPITAL_COMMUNITY)
Admission: RE | Admit: 2014-02-27 | Discharge: 2014-02-27 | Disposition: A | Discharge status: Home / Self Care | Payer: Medicare Other | Source: Ambulatory Visit | Attending: Internal Medicine | Admitting: Internal Medicine

## 2014-02-27 VITALS — BP 116/64 | HR 80 | Wt 122.2 lb

## 2014-02-27 DIAGNOSIS — N189 Chronic kidney disease, unspecified: Secondary | ICD-10-CM | POA: Diagnosis not present

## 2014-02-27 DIAGNOSIS — I5022 Chronic systolic (congestive) heart failure: Secondary | ICD-10-CM | POA: Diagnosis not present

## 2014-02-27 DIAGNOSIS — I5023 Acute on chronic systolic (congestive) heart failure: Secondary | ICD-10-CM | POA: Diagnosis not present

## 2014-02-27 LAB — BASIC METABOLIC PANEL
BUN: 41 mg/dL — ABNORMAL HIGH (ref 6–23)
CO2: 22 mEq/L (ref 19–32)
CREATININE: 1.72 mg/dL — AB (ref 0.50–1.10)
Calcium: 9.7 mg/dL (ref 8.4–10.5)
Chloride: 97 mEq/L (ref 96–112)
GFR calc Af Amer: 31 mL/min — ABNORMAL LOW (ref >90)
GFR, EST NON AFRICAN AMERICAN: 27 mL/min — AB (ref >90)
GLUCOSE: 102 mg/dL — AB (ref 70–99)
POTASSIUM: 5 meq/L (ref 3.7–5.3)
Sodium: 134 mEq/L — ABNORMAL LOW (ref 137–147)

## 2014-02-27 LAB — DIGOXIN LEVEL: Digoxin Level: 0.8 ng/mL (ref 0.8–2.0)

## 2014-02-27 LAB — PRO B NATRIURETIC PEPTIDE: Pro B Natriuretic peptide (BNP): 1089 pg/mL — ABNORMAL HIGH (ref 0–450)

## 2014-02-27 MED ORDER — CARVEDILOL 3.125 MG PO TABS: 3.1250 mg | ORAL_TABLET | Freq: Two times a day (BID) | ORAL | Status: DC

## 2014-02-27 NOTE — Patient Instructions (Signed)
Start Carvedilol 3.125 mg Twice daily   Labs today  Your physician recommends that you schedule a follow-up appointment in: 2 weeks

## 2014-02-27 NOTE — Progress Notes (Signed)
Patient ID: Margean Korell, female   DOB: 01-Aug-1932, 80 y.o.   MRN: [id number] Oncologist: Dr Marin Olp  HPI: Joetta is a 80 y.o. female with a history of multiple myeloma (diagnosed 11/2012 - treated with XRT/chemo), breast CA, CKD and recent diagnosis of systolic HF.   Apparently her EF was normal in September 2014. She was on Herceptin for breast CA with Dr. Marin Olp for 1 year, starting in June 2014. In February, she had an echo which showed an EF of 20-25%. The Herceptin was discontinued. She was admitted last week to [facility] for volume overload. She was diuresed. Discharged to [facility] on 01/22/14. Weight ~127. Discharged on lasix 66m daily. In hospital diuresis was limited by low BP. Incidental finding of lives mass - ? Mets.  She had echo 01/17/14: LVEF 20% RV mildly dilated. Moderate TR.   She was admitted later in in 4/15 from clinic with volume overload.  She was started on milrinone and Lasix gtts with good diuresis.  BP was on the low side so she was not started on beta blocker or ACEI.   Saw Dr. MAundra Dubin2 weeks ago and started on lisinopril 2.587mqhs. [facility] Starting to feel stronger. Now using cane (occasioanlly walker). SBP 100-110. Breathing is doing better.  She tires easily.  Edema much better. Weight stable at 122. Tripped and fell and got large cut on right calf. Saw Dr. EnMarin Olpn 4/30 and started back on Revlimid for myeloma. Cr up some.   Labs (4/15): Na 137, K 4.2 => 3.9, Cr 1.27 => 1.35 Labs 4/30: K 3.9 Cr 1.9  Review of Systems: All systems reviewed and negative except as per HPI.   Past Medical History  Diagnosis Date  . Diverticulosis   . Hiatal hernia   . Hyperlipidemia   . Hypertension   . Breast mass   . Multiple myeloma   . Breast cancer   . Arthritis   . S/P radiation therapy 12/15/12 - 12/21/12    T6 - L1 / 18.75 Gy / 5 Fractions  . Bilateral atelectasis   . Systolic heart failure     EF 20% 11/2013  .  Protein-calorie malnutrition, severe 01/20/2014  . Chronic kidney disease     stage 3    Current Outpatient Prescriptions  Medication Sig Dispense Refill  . calcium carbonate (OS-CAL) 600 MG TABS tablet Take 1,200 mg by mouth daily with breakfast.      . Coenzyme Q10 (CO Q 10) 100 MG CAPS Take 100 mg by mouth every morning.       . digoxin (LANOXIN) 0.125 MG tablet Take 0.125 mg by mouth daily.      . Marland KitchenYDROmorphone (DILAUDID) 2 MG tablet Take 2 mg by mouth every 6 (six) hours as needed (pain).       . Marland Kitchenenalidomide (REVLIMID) 15 MG capsule Take 1 capsule (15 mg total) by mouth daily.  21 capsule  4  . lisinopril (PRINIVIL,ZESTRIL) 2.5 MG tablet Take 2.5 mg by mouth daily.       . Marland Kitchenmeprazole (PRILOSEC) 20 MG capsule Take 20 mg by mouth every morning.      . polyethylene glycol (MIRALAX / GLYCOLAX) packet Take 17 g by mouth daily.      . potassium chloride SA (K-DUR,KLOR-CON) 20 MEQ tablet Take 20 mEq by mouth daily.      . Marland Kitchenpironolactone (ALDACTONE) 25 MG tablet Take 1 tablet (25 mg total) by mouth  daily.  30 tablet  6  . torsemide (DEMADEX) 20 MG tablet Take 20 mg by mouth daily.      Marland Kitchen zolpidem (AMBIEN) 5 MG tablet Take 5 mg by mouth at bedtime as needed for sleep.       No current facility-administered medications for this encounter.    No Known Allergies  History   Social History  . Marital Status: Widowed    Spouse Name: N/A    Number of Children: N/A  . Years of Education: N/A   Occupational History  . Retired    Social History Main Topics  . Smoking status: Never Smoker   . Smokeless tobacco: Never Used     Comment: never used tobacco  . Alcohol Use: No     Comment: heavy alcohol use until 2013  . Drug Use: No  . Sexual Activity: No   Other Topics Concern  . Not on file   Social History Narrative   Has apartment, lives alone.  Uses a cane around apartment.  Has walker. Drives.      Family History  Problem Relation Age of Onset  . Prostate cancer Father   .  Heart attack Brother   . Asthma Maternal Grandmother   . Multiple sclerosis Sister   . Lymphoma Son     large B-cell  . Heart attack Father     PHYSICAL EXAM: Filed Vitals:   02/27/14 1147  BP: 116/64  Pulse: 80  Weight: 122 lb 4 oz (55.452 kg)  SpO2: 95%    General:   Weak appearing. Frail.   HEENT: normal Neck: supple. JVP 9 cm. Carotids 2+ bilat; no bruits. No lymphadenopathy or thyromegaly appreciated. Cor: PMI laterally displaced. Regular rate & rhythm. No S3. 2/6 TR murmur. Lungs: decreased at bases Abdomen: soft, nontender, nondistended. No hepatosplenomegaly. No bruits or masses. Good bowel sounds. Extremities: no cyanosis, clubbing, rash.  Trace edema.  Healing wound on R calf  Neuro: alert & oriented x 3, cranial nerves grossly intact. moves all 4 extremities w/o difficulty. Affect pleasant.  ASSESSMENT & PLAN: 1. Acute on chronic systolic HF:  Suspect nonischemic cardiomyopathy possibly due to Herceptin use, EF 20%.  Recent admission with significant volume overload.  She required milrinone and was diuresed.   - Continues to improve slowly but still very tenuous. NYHA III. Volume status stable.  - Was planning on increasing lisinopril today but recent BMET with worsening renal function. Will repeat labs today. If renal function not improving will stop lisinopril.  - Continue digoxin and spironolactone at current doses. Check digoxin level.  - Continue current dose of torsemide. - add carvedilol 3.125 bid - Will try to advance CHF meds over the next few months and repeat echo in 3-4 months to see if function is improving.  2. CKD: Follow creatinine carefully.  3. Breast CA: Per Dr Marin Olp, would avoid Herceptin in future.  4. Multiple myeloma: Per Dr. Marin Olp.  5. DNR/DNI  Followup 2 wks.   Shaune Pascal Katalyn Matin 02/27/2014

## 2014-02-28 DIAGNOSIS — D630 Anemia in neoplastic disease: Secondary | ICD-10-CM | POA: Diagnosis not present

## 2014-02-28 DIAGNOSIS — I5023 Acute on chronic systolic (congestive) heart failure: Secondary | ICD-10-CM | POA: Diagnosis not present

## 2014-02-28 DIAGNOSIS — N183 Chronic kidney disease, stage 3 unspecified: Secondary | ICD-10-CM | POA: Diagnosis not present

## 2014-02-28 DIAGNOSIS — Z853 Personal history of malignant neoplasm of breast: Secondary | ICD-10-CM | POA: Diagnosis not present

## 2014-02-28 DIAGNOSIS — C9000 Multiple myeloma not having achieved remission: Secondary | ICD-10-CM | POA: Diagnosis not present

## 2014-02-28 DIAGNOSIS — I129 Hypertensive chronic kidney disease with stage 1 through stage 4 chronic kidney disease, or unspecified chronic kidney disease: Secondary | ICD-10-CM | POA: Diagnosis not present

## 2014-03-02 DIAGNOSIS — D630 Anemia in neoplastic disease: Secondary | ICD-10-CM | POA: Diagnosis not present

## 2014-03-02 DIAGNOSIS — I129 Hypertensive chronic kidney disease with stage 1 through stage 4 chronic kidney disease, or unspecified chronic kidney disease: Secondary | ICD-10-CM | POA: Diagnosis not present

## 2014-03-02 DIAGNOSIS — I5023 Acute on chronic systolic (congestive) heart failure: Secondary | ICD-10-CM | POA: Diagnosis not present

## 2014-03-02 DIAGNOSIS — N183 Chronic kidney disease, stage 3 unspecified: Secondary | ICD-10-CM | POA: Diagnosis not present

## 2014-03-02 DIAGNOSIS — Z853 Personal history of malignant neoplasm of breast: Secondary | ICD-10-CM | POA: Diagnosis not present

## 2014-03-02 DIAGNOSIS — C9000 Multiple myeloma not having achieved remission: Secondary | ICD-10-CM | POA: Diagnosis not present

## 2014-03-05 DIAGNOSIS — N183 Chronic kidney disease, stage 3 unspecified: Secondary | ICD-10-CM | POA: Diagnosis not present

## 2014-03-05 DIAGNOSIS — C9000 Multiple myeloma not having achieved remission: Secondary | ICD-10-CM | POA: Diagnosis not present

## 2014-03-05 DIAGNOSIS — D630 Anemia in neoplastic disease: Secondary | ICD-10-CM | POA: Diagnosis not present

## 2014-03-05 DIAGNOSIS — Z853 Personal history of malignant neoplasm of breast: Secondary | ICD-10-CM | POA: Diagnosis not present

## 2014-03-05 DIAGNOSIS — I5023 Acute on chronic systolic (congestive) heart failure: Secondary | ICD-10-CM | POA: Diagnosis not present

## 2014-03-05 DIAGNOSIS — I129 Hypertensive chronic kidney disease with stage 1 through stage 4 chronic kidney disease, or unspecified chronic kidney disease: Secondary | ICD-10-CM | POA: Diagnosis not present

## 2014-03-08 DIAGNOSIS — N183 Chronic kidney disease, stage 3 unspecified: Secondary | ICD-10-CM | POA: Diagnosis not present

## 2014-03-08 DIAGNOSIS — C9000 Multiple myeloma not having achieved remission: Secondary | ICD-10-CM | POA: Diagnosis not present

## 2014-03-08 DIAGNOSIS — I5023 Acute on chronic systolic (congestive) heart failure: Secondary | ICD-10-CM | POA: Diagnosis not present

## 2014-03-08 DIAGNOSIS — Z853 Personal history of malignant neoplasm of breast: Secondary | ICD-10-CM | POA: Diagnosis not present

## 2014-03-08 DIAGNOSIS — D630 Anemia in neoplastic disease: Secondary | ICD-10-CM | POA: Diagnosis not present

## 2014-03-08 DIAGNOSIS — I129 Hypertensive chronic kidney disease with stage 1 through stage 4 chronic kidney disease, or unspecified chronic kidney disease: Secondary | ICD-10-CM | POA: Diagnosis not present

## 2014-03-14 ENCOUNTER — Ambulatory Visit (HOSPITAL_COMMUNITY)
Admission: RE | Admit: 2014-03-14 | Discharge: 2014-03-14 | Disposition: A | Discharge status: Home / Self Care | Payer: Medicare Other | Source: Ambulatory Visit | Attending: Internal Medicine | Admitting: Internal Medicine

## 2014-03-14 VITALS — BP 98/54 | HR 85 | Wt 122.5 lb

## 2014-03-14 DIAGNOSIS — I428 Other cardiomyopathies: Secondary | ICD-10-CM | POA: Insufficient documentation

## 2014-03-14 DIAGNOSIS — N183 Chronic kidney disease, stage 3 unspecified: Secondary | ICD-10-CM | POA: Insufficient documentation

## 2014-03-14 DIAGNOSIS — I5022 Chronic systolic (congestive) heart failure: Secondary | ICD-10-CM | POA: Diagnosis not present

## 2014-03-14 DIAGNOSIS — I079 Rheumatic tricuspid valve disease, unspecified: Secondary | ICD-10-CM | POA: Insufficient documentation

## 2014-03-14 DIAGNOSIS — I129 Hypertensive chronic kidney disease with stage 1 through stage 4 chronic kidney disease, or unspecified chronic kidney disease: Secondary | ICD-10-CM | POA: Diagnosis not present

## 2014-03-14 DIAGNOSIS — Z79899 Other long term (current) drug therapy: Secondary | ICD-10-CM | POA: Insufficient documentation

## 2014-03-14 DIAGNOSIS — C9000 Multiple myeloma not having achieved remission: Secondary | ICD-10-CM | POA: Diagnosis not present

## 2014-03-14 DIAGNOSIS — R5381 Other malaise: Secondary | ICD-10-CM

## 2014-03-14 DIAGNOSIS — Z66 Do not resuscitate: Secondary | ICD-10-CM | POA: Insufficient documentation

## 2014-03-14 DIAGNOSIS — E43 Unspecified severe protein-calorie malnutrition: Secondary | ICD-10-CM | POA: Diagnosis not present

## 2014-03-14 DIAGNOSIS — E785 Hyperlipidemia, unspecified: Secondary | ICD-10-CM | POA: Insufficient documentation

## 2014-03-14 DIAGNOSIS — Z7982 Long term (current) use of aspirin: Secondary | ICD-10-CM | POA: Insufficient documentation

## 2014-03-14 DIAGNOSIS — I509 Heart failure, unspecified: Secondary | ICD-10-CM | POA: Diagnosis not present

## 2014-03-14 DIAGNOSIS — Z853 Personal history of malignant neoplasm of breast: Secondary | ICD-10-CM | POA: Insufficient documentation

## 2014-03-14 MED ORDER — CARVEDILOL 6.25 MG PO TABS: 6.2500 mg | ORAL_TABLET | Freq: Two times a day (BID) | ORAL | Status: DC

## 2014-03-14 NOTE — Addendum Note (Signed)
Encounter addended by: Scarlette Calico, RN on: 03/14/2014 12:20 PM<BR>     Documentation filed: Patient Instructions Section, Orders

## 2014-03-14 NOTE — Progress Notes (Signed)
Patient ID: Jacqueline Rivas, female   DOB: 1931/12/06, 80 y.o.   MRN: [id number] Oncologist: Dr Jacqueline Rivas  HPI: Jacqueline Rivas is a 80 y.o. female with a history of multiple myeloma (diagnosed 11/2012 - treated with XRT/chemo), breast CA, CKD and recent diagnosis of systolic HF.   Apparently her EF was normal in September 2014. She was on Herceptin for breast CA with Dr. Marin Rivas for 1 year, starting in June 2014. In February, she had an echo which showed an EF of 20-25%. The Herceptin was discontinued.  She had echo 01/17/14: LVEF 20% RV mildly dilated. Moderate TR.   She was admitted later in in 4/15 from clinic with volume overload.  She was started on milrinone and Lasix gtts with good diuresis.  BP was on the low side so she was not started on beta blocker or ACEI.   Here for f/u. At last visit started on carvedilol 3.125 bid. Creatinine had trended up to 1.9 but was back down to 1.7. (baseline 1.3-1.4). Feels so-so. Weight very stable at 122. SBP typically 100-115. Has Jacqueline Rivas coming to the house. Only getting HHRN and not HHPT. Breathing is doing better. She tires easily.  Edema much better. Walks with cane. If dizzy, walks with walker.   Labs (4/15): Na 137, K 4.2 => 3.9, Cr 1.27 => 1.35 Labs 4/30: K 3.9 Cr 1.9 5/12 Cr 1.7 K 5.0  Review of Systems: All systems reviewed and negative except as per HPI.   Past Medical History  Diagnosis Date  . Diverticulosis   . Hiatal hernia   . Hyperlipidemia   . Hypertension   . Breast mass   . Multiple myeloma   . Breast cancer   . Arthritis   . S/P radiation therapy 12/15/12 - 12/21/12    T6 - L1 / 18.75 Gy / 5 Fractions  . Bilateral atelectasis   . Systolic heart failure     EF 20% 11/2013  . Protein-calorie malnutrition, severe 01/20/2014  . Chronic kidney disease     stage 3    Current Outpatient Prescriptions  Medication Sig Dispense Refill  . aspirin 325 MG tablet Take 325 mg by mouth daily.      . calcium carbonate (OS-CAL) 600 MG TABS tablet  Take 1,200 mg by mouth daily with breakfast.      . carvedilol (COREG) 3.125 MG tablet Take 1 tablet (3.125 mg total) by mouth 2 (two) times daily with a meal.  60 tablet  3  . Coenzyme Q10 (CO Q 10) 100 MG CAPS Take 100 mg by mouth every morning.       Marland Kitchen dexamethasone (DECADRON) 4 MG tablet Take 20 mg by mouth once a week.      . digoxin (LANOXIN) 0.125 MG tablet Take 0.125 mg by mouth daily.      Marland Kitchen HYDROmorphone (DILAUDID) 2 MG tablet Take 2 mg by mouth every 6 (six) hours as needed (pain).       Marland Kitchen lenalidomide (REVLIMID) 15 MG capsule Take 1 capsule (15 mg total) by mouth daily.  21 capsule  4  . lisinopril (PRINIVIL,ZESTRIL) 2.5 MG tablet Take 2.5 mg by mouth daily.       Marland Kitchen omeprazole (PRILOSEC) 20 MG capsule Take 20 mg by mouth every morning.      . polyethylene glycol (MIRALAX / GLYCOLAX) packet Take 17 g by mouth daily.      . potassium chloride SA (K-DUR,KLOR-CON) 20 MEQ tablet Take 20 mEq by mouth daily.      Marland Kitchen  spironolactone (ALDACTONE) 25 MG tablet Take 1 tablet (25 mg total) by mouth daily.  30 tablet  6  . torsemide (DEMADEX) 20 MG tablet Take 20 mg by mouth daily.      Marland Kitchen zolpidem (AMBIEN) 5 MG tablet Take 5 mg by mouth at bedtime as needed for sleep.       No current facility-administered medications for this encounter.    No Known Allergies  History   Social History  . Marital Status: Widowed    Spouse Name: N/A    Number of Children: N/A  . Years of Education: N/A   Occupational History  . Retired    Social History Main Topics  . Smoking status: Never Smoker   . Smokeless tobacco: Never Used     Comment: never used tobacco  . Alcohol Use: No     Comment: heavy alcohol use until 2013  . Drug Use: No  . Sexual Activity: No   Other Topics Concern  . Not on file   Social History Narrative   Has apartment, lives alone.  Uses a cane around apartment.  Has walker. Drives.      Family History  Problem Relation Age of Onset  . Prostate cancer Father   . Heart  attack Brother   . Asthma Maternal Grandmother   . Multiple sclerosis Sister   . Lymphoma Son     large B-cell  . Heart attack Father     PHYSICAL EXAM: Filed Vitals:   03/14/14 1136  BP: 98/54  Pulse: 85  Weight: 122 lb 8 oz (55.566 kg)  SpO2: 96%    General:   Weak appearing. Frail.   HEENT: normal Neck: supple. JVP 7-8 cm. Carotids 2+ bilat; no bruits. No lymphadenopathy or thyromegaly appreciated. Cor: PMI laterally displaced. Regular rate & rhythm. No S3. 2/6 TR murmur. Lungs: decreased at bases Abdomen: soft, nontender, nondistended. No hepatosplenomegaly. No bruits or masses. Good bowel sounds. Extremities: no cyanosis, clubbing, rash.  Trace edema.  Healing wound on R calf  Neuro: alert & oriented x 3, cranial nerves grossly intact. moves all 4 extremities w/o difficulty. Affect pleasant.  ASSESSMENT & PLAN: 1. Chronic systolic HF:  Suspect nonischemic cardiomyopathy possibly due to Herceptin use, EF 20%.  Recent admission with significant volume overload.  She required milrinone and was diuresed.   - Continues to improve slowly but still very tenuous. NYHA III. Volume status stable. - Continue digoxin and spironolactone at current doses. - Continue current dose of torsemide. - Increase to carvedilol 3.125/6,25. If tolerating well can go to 6.25 bid in 1 week - Will try to advance CHF meds over the next few months and repeat echo in 1-2 months to see if function is improving.  - Labs with Dr. Marin Rivas on Friday (BMET) - Will contact Jacqueline Rivas to try to arrange HHPT 2. CKD: Follow creatinine carefully.  3. Breast CA: Per Dr Jacqueline Rivas, would avoid Herceptin in future.  4. Multiple myeloma: Per Dr. Marin Rivas.  5. DNR/DNI  Followup 2 wks.   Jacqueline Pascal Quorra Rosene MD 03/14/2014

## 2014-03-14 NOTE — Patient Instructions (Signed)
Increase Carvedilol to 3.125 mg in AM and 6.25 mg (2 tabs) in PM for 1 week then increase to 6.25 mg (2 tabs) Twice daily   Your physician recommends that you schedule a follow-up appointment in: 2 weeks

## 2014-03-15 DIAGNOSIS — D630 Anemia in neoplastic disease: Secondary | ICD-10-CM | POA: Diagnosis not present

## 2014-03-15 DIAGNOSIS — Z853 Personal history of malignant neoplasm of breast: Secondary | ICD-10-CM | POA: Diagnosis not present

## 2014-03-15 DIAGNOSIS — I5023 Acute on chronic systolic (congestive) heart failure: Secondary | ICD-10-CM | POA: Diagnosis not present

## 2014-03-15 DIAGNOSIS — C9000 Multiple myeloma not having achieved remission: Secondary | ICD-10-CM | POA: Diagnosis not present

## 2014-03-15 DIAGNOSIS — N183 Chronic kidney disease, stage 3 unspecified: Secondary | ICD-10-CM | POA: Diagnosis not present

## 2014-03-15 DIAGNOSIS — I129 Hypertensive chronic kidney disease with stage 1 through stage 4 chronic kidney disease, or unspecified chronic kidney disease: Secondary | ICD-10-CM | POA: Diagnosis not present

## 2014-03-16 ENCOUNTER — Ambulatory Visit (HOSPITAL_BASED_OUTPATIENT_CLINIC_OR_DEPARTMENT_OTHER): Payer: Medicare Other

## 2014-03-16 ENCOUNTER — Encounter: Payer: Self-pay | Admitting: Hematology & Oncology

## 2014-03-16 ENCOUNTER — Ambulatory Visit (HOSPITAL_BASED_OUTPATIENT_CLINIC_OR_DEPARTMENT_OTHER): Payer: Medicare Other | Admitting: Lab

## 2014-03-16 ENCOUNTER — Ambulatory Visit (HOSPITAL_BASED_OUTPATIENT_CLINIC_OR_DEPARTMENT_OTHER): Payer: Medicare Other | Admitting: Hematology & Oncology

## 2014-03-16 ENCOUNTER — Telehealth: Payer: Self-pay | Admitting: Hematology & Oncology

## 2014-03-16 VITALS — BP 107/57 | HR 69 | Temp 97.9°F | Resp 14 | Ht 65.0 in | Wt 120.0 lb

## 2014-03-16 DIAGNOSIS — N183 Chronic kidney disease, stage 3 unspecified: Secondary | ICD-10-CM

## 2014-03-16 DIAGNOSIS — I5043 Acute on chronic combined systolic (congestive) and diastolic (congestive) heart failure: Secondary | ICD-10-CM

## 2014-03-16 DIAGNOSIS — D630 Anemia in neoplastic disease: Secondary | ICD-10-CM | POA: Diagnosis not present

## 2014-03-16 DIAGNOSIS — C9000 Multiple myeloma not having achieved remission: Secondary | ICD-10-CM

## 2014-03-16 DIAGNOSIS — C773 Secondary and unspecified malignant neoplasm of axilla and upper limb lymph nodes: Secondary | ICD-10-CM

## 2014-03-16 DIAGNOSIS — C50919 Malignant neoplasm of unspecified site of unspecified female breast: Secondary | ICD-10-CM

## 2014-03-16 LAB — CMP (CANCER CENTER ONLY)
ALBUMIN: 3.3 g/dL (ref 3.3–5.5)
ALK PHOS: 70 U/L (ref 26–84)
ALT(SGPT): 51 U/L — ABNORMAL HIGH (ref 10–47)
AST: 34 U/L (ref 11–38)
BILIRUBIN TOTAL: 0.8 mg/dL (ref 0.20–1.60)
BUN, Bld: 53 mg/dL — ABNORMAL HIGH (ref 7–22)
CO2: 27 mEq/L (ref 18–33)
Calcium: 9.3 mg/dL (ref 8.0–10.3)
Chloride: 99 mEq/L (ref 98–108)
Creat: 1.7 mg/dl — ABNORMAL HIGH (ref 0.6–1.2)
GLUCOSE: 121 mg/dL — AB (ref 73–118)
Potassium: 4.1 mEq/L (ref 3.3–4.7)
Sodium: 139 mEq/L (ref 128–145)
Total Protein: 8 g/dL (ref 6.4–8.1)

## 2014-03-16 LAB — CBC WITH DIFFERENTIAL (CANCER CENTER ONLY)
BASO#: 0 10*3/uL (ref 0.0–0.2)
BASO%: 0.4 % (ref 0.0–2.0)
EOS ABS: 0.1 10*3/uL (ref 0.0–0.5)
EOS%: 0.7 % (ref 0.0–7.0)
HCT: 31.5 % — ABNORMAL LOW (ref 34.8–46.6)
HGB: 10.6 g/dL — ABNORMAL LOW (ref 11.6–15.9)
LYMPH#: 0.6 10*3/uL — ABNORMAL LOW (ref 0.9–3.3)
LYMPH%: 6 % — AB (ref 14.0–48.0)
MCH: 34.3 pg — ABNORMAL HIGH (ref 26.0–34.0)
MCHC: 33.7 g/dL (ref 32.0–36.0)
MCV: 102 fL — AB (ref 81–101)
MONO#: 0.7 10*3/uL (ref 0.1–0.9)
MONO%: 7.2 % (ref 0.0–13.0)
NEUT#: 8.1 10*3/uL — ABNORMAL HIGH (ref 1.5–6.5)
NEUT%: 85.7 % — ABNORMAL HIGH (ref 39.6–80.0)
Platelets: 195 10*3/uL (ref 145–400)
RBC: 3.09 10*6/uL — ABNORMAL LOW (ref 3.70–5.32)
RDW: 17.4 % — ABNORMAL HIGH (ref 11.1–15.7)
WBC: 9.5 10*3/uL (ref 3.9–10.0)

## 2014-03-16 MED ORDER — FENTANYL 12 MCG/HR TD PT72: 12.5000 ug | MEDICATED_PATCH | TRANSDERMAL | Status: DC

## 2014-03-16 MED ORDER — DEXAMETHASONE 4 MG PO TABS: 20.0000 mg | ORAL_TABLET | ORAL | Status: DC

## 2014-03-16 MED ORDER — DARBEPOETIN ALFA-POLYSORBATE 300 MCG/0.6ML IJ SOLN: 300.0000 ug | Freq: Once | INTRAMUSCULAR | Status: DC

## 2014-03-16 MED ORDER — TORSEMIDE 20 MG PO TABS: 20.0000 mg | ORAL_TABLET | Freq: Every day | ORAL | Status: DC

## 2014-03-16 MED ORDER — DIGOXIN 125 MCG PO TABS: 0.1250 mg | ORAL_TABLET | Freq: Every day | ORAL | Status: DC

## 2014-03-16 MED ORDER — DARBEPOETIN ALFA-POLYSORBATE 300 MCG/0.6ML IJ SOLN
INTRAMUSCULAR | Status: AC
  Filled 2014-03-16: qty 0.6

## 2014-03-16 MED ORDER — DARBEPOETIN ALFA-POLYSORBATE 300 MCG/0.6ML IJ SOLN
300.0000 ug | Freq: Once | INTRAMUSCULAR | Status: AC
  Administered 2014-03-16: 300 ug via SUBCUTANEOUS

## 2014-03-16 NOTE — Patient Instructions (Signed)
Darbepoetin Alfa injection What is this medicine? DARBEPOETIN ALFA (dar be POE e tin AL fa) helps your body make more red blood cells. It is used to treat anemia caused by chronic kidney failure and chemotherapy. This medicine may be used for other purposes; ask your health care provider or pharmacist if you have questions. COMMON BRAND NAME(S): Aranesp What should I tell my health care provider before I take this medicine? They need to know if you have any of these conditions: -blood clotting disorders or history of blood clots -cancer patient not on chemotherapy -cystic fibrosis -heart disease, such as angina, heart failure, or a history of a heart attack -hemoglobin level of 12 g/dL or greater -high blood pressure -low levels of folate, iron, or vitamin B12 -seizures -an unusual or allergic reaction to darbepoetin, erythropoietin, albumin, hamster proteins, latex, other medicines, foods, dyes, or preservatives -pregnant or trying to get pregnant -breast-feeding How should I use this medicine? This medicine is for injection into a vein or under the skin. It is usually given by a health care professional in a hospital or clinic setting. If you get this medicine at home, you will be taught how to prepare and give this medicine. Do not shake the solution before you withdraw a dose. Use exactly as directed. Take your medicine at regular intervals. Do not take your medicine more often than directed. It is important that you put your used needles and syringes in a special sharps container. Do not put them in a trash can. If you do not have a sharps container, call your pharmacist or healthcare provider to get one. Talk to your pediatrician regarding the use of this medicine in children. While this medicine may be used in children as young as 1 year for selected conditions, precautions do apply. Overdosage: If you think you have taken too much of this medicine contact a poison control center or  emergency room at once. NOTE: This medicine is only for you. Do not share this medicine with others. What if I miss a dose? If you miss a dose, take it as soon as you can. If it is almost time for your next dose, take only that dose. Do not take double or extra doses. What may interact with this medicine? Do not take this medicine with any of the following medications: -epoetin alfa This list may not describe all possible interactions. Give your health care provider a list of all the medicines, herbs, non-prescription drugs, or dietary supplements you use. Also tell them if you smoke, drink alcohol, or use illegal drugs. Some items may interact with your medicine. What should I watch for while using this medicine? Visit your prescriber or health care professional for regular checks on your progress and for the needed blood tests and blood pressure measurements. It is especially important for the doctor to make sure your hemoglobin level is in the desired range, to limit the risk of potential side effects and to give you the best benefit. Keep all appointments for any recommended tests. Check your blood pressure as directed. Ask your doctor what your blood pressure should be and when you should contact him or her. As your body makes more red blood cells, you may need to take iron, folic acid, or vitamin B supplements. Ask your doctor or health care provider which products are right for you. If you have kidney disease continue dietary restrictions, even though this medication can make you feel better. Talk with your doctor or health   care professional about the foods you eat and the vitamins that you take. What side effects may I notice from receiving this medicine? Side effects that you should report to your doctor or health care professional as soon as possible: -allergic reactions like skin rash, itching or hives, swelling of the face, lips, or tongue -breathing problems -changes in vision -chest  pain -confusion, trouble speaking or understanding -feeling faint or lightheaded, falls -high blood pressure -muscle aches or pains -pain, swelling, warmth in the leg -rapid weight gain -severe headaches -sudden numbness or weakness of the face, arm or leg -trouble walking, dizziness, loss of balance or coordination -seizures (convulsions) -swelling of the ankles, feet, hands -unusually weak or tired Side effects that usually do not require medical attention (report to your doctor or health care professional if they continue or are bothersome): -diarrhea -fever, chills (flu-like symptoms) -headaches -nausea, vomiting -redness, stinging, or swelling at site where injected This list may not describe all possible side effects. Call your doctor for medical advice about side effects. You may report side effects to FDA at 1-800-FDA-1088. Where should I keep my medicine? Keep out of the reach of children. Store in a refrigerator between 2 and 8 degrees C (36 and 46 degrees F). Do not freeze. Do not shake. Throw away any unused portion if using a single-dose vial. Throw away any unused medicine after the expiration date. NOTE: This sheet is a summary. It may not cover all possible information. If you have questions about this medicine, talk to your doctor, pharmacist, or health care provider.  2014, Elsevier/Gold Standard. (2008-09-18 10:23:57)  

## 2014-03-16 NOTE — Progress Notes (Signed)
Hematology and Oncology Follow Up Visit  Jacqueline Rivas [id number] 1931-11-11 80 y.o. 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 We have Jacqueline Rivas back on Revlimid. We held his because of Jacqueline Rivas in the hospital and because of the potential toxicity with diarrhea. She's not had diarrhea with it.  Jacqueline Rivas cardiomyopathy seems be doing much better. She's been followed cardiology for this. She is at home now. She does have physical therapy at home. Jacqueline Rivas cardiac function will be rechecked in about a month or so.  We got Jacqueline Rivas back onto Revlimid as Jacqueline Rivas M spike was going back up. When we last checked it, M spike was 1.7 mg/dL. Jacqueline Rivas IgA level was 1950 mg/dL. Jacqueline Rivas Kappa light chain was 87.4 mg/dL. These were all gone back up. Again, she did not have Revlimid for a couple months. She now is back on Revlimid. I lowered Jacqueline Rivas dose to 15 mg.  She's had no increase in pain issues. She is on Duragesic patch. This does seem to help.  She continues on Jacqueline Rivas diuretics. She continues on Jacqueline Rivas other cardiac meds.  Overall, Jacqueline Rivas performance status is ECOG 2    Medications: Current outpatient prescriptions:aspirin 325 MG tablet, Take 325 mg by mouth daily., Disp: , Rfl: ;  calcium carbonate (OS-CAL) 600 MG TABS tablet, Take 1,200 mg by mouth daily with breakfast., Disp: , Rfl: ;  carvedilol (COREG) 6.25 MG tablet, Take 6.25 mg by mouth 2 (two) times daily with a meal. 1 TAB IN AM AND 2 TABS IN PM, Disp: , Rfl: ;  Coenzyme Q10 (CO Q 10) 100 MG CAPS, Take 100 mg by mouth every morning. , Disp: , Rfl:  dexamethasone (DECADRON) 4 MG tablet, Take 5 tablets (20 mg total)  by mouth once a week., Disp: 120 tablet, Rfl: 3;  digoxin (LANOXIN) 0.125 MG tablet, Take 1 tablet (0.125 mg total) by mouth daily., Disp: 30 tablet, Rfl: 3;  HYDROmorphone (DILAUDID) 2 MG tablet, Take 2 mg by mouth every 6 (six) hours as needed (pain). , Disp: , Rfl: ;  lenalidomide (REVLIMID) 15 MG capsule, Take 1 capsule (15 mg total) by mouth daily., Disp: 21 capsule, Rfl: 4 lisinopril (PRINIVIL,ZESTRIL) 2.5 MG tablet, Take 2.5 mg by mouth daily. , Disp: , Rfl: ;  omeprazole (PRILOSEC) 20 MG capsule, Take 20 mg by mouth every morning., Disp: , Rfl: ;  polyethylene glycol (MIRALAX / GLYCOLAX) packet, Take 17 g by mouth daily., Disp: , Rfl: ;  potassium chloride SA (K-DUR,KLOR-CON) 20 MEQ tablet, Take 20 mEq by mouth daily., Disp: , Rfl:  spironolactone (ALDACTONE) 25 MG tablet, Take 1 tablet (25 mg total) by mouth daily., Disp: 30 tablet, Rfl: 6;  torsemide (DEMADEX) 20 MG tablet, Take 1 tablet (20 mg total) by mouth daily., Disp: 30 tablet, Rfl: 3;  zolpidem (AMBIEN) 5 MG tablet, Take 5 mg by mouth at bedtime as needed for sleep., Disp: , Rfl: ;  fentaNYL (DURAGESIC) 12 MCG/HR, Place 1 patch (12.5 mcg total) onto the skin every 3 (three) days., Disp: 10 patch, Rfl: 0  Allergies: No Known Allergies  Past Medical History, Surgical history, Social history, and Family History were reviewed and updated.  Review of Systems: As above  Physical Exam:  height is 5\' 5"  (1.651 m) and weight is 120 lb (54.432 kg). Jacqueline Rivas oral temperature is 97.9 F (36.6 C). Jacqueline Rivas blood pressure is 107/57 and Jacqueline Rivas pulse is 69. Jacqueline Rivas respiration is 14.   Elderly somewhat frail white female. Head and neck exam shows no ocular or oral lesions. There is no palpable cervical or supraclavicular lymph nodes. Lungs are clear. Cardiac exam regular in rhythm. She has no murmurs rubs or bruits. Jacqueline Rivas right physical exam shows well-healed mastectomy. No nodules are noted. There is no right axillary adenopathy. Left breast is unremarkable. There is  no left axillary adenopathy. Abdomen is soft. Has good bowel sounds. There is no fluid wave. There is no palpable liver or spleen tip. Extremities shows some trace edema in Jacqueline Rivas lower legs. She has age-related osteoarthritic changes. Muscle strength is 4/5 bilaterally. Exam shows kyphosis. Skin exam shows no ecchymoses or petechia. Neurological exam is nonfocal.  Lab Results  Component Value Date   WBC 9.5 03/16/2014   HGB 10.6* 03/16/2014   HCT 31.5* 03/16/2014   MCV 102* 03/16/2014   PLT 195 03/16/2014     Chemistry      Component Value Date/Time   NA 139 03/16/2014 1215   NA 134* 02/27/2014 1213   K 4.1 03/16/2014 1215   K 5.0 02/27/2014 1213   CL 99 03/16/2014 1215   CL 97 02/27/2014 1213   CO2 27 03/16/2014 1215   CO2 22 02/27/2014 1213   BUN 53* 03/16/2014 1215   BUN 41* 02/27/2014 1213   CREATININE 1.7* 03/16/2014 1215   CREATININE 1.72* 02/27/2014 1213      Component Value Date/Time   CALCIUM 9.3 03/16/2014 1215   CALCIUM 9.7 02/27/2014 1213   CALCIUM 11.1* 11/13/2012 1526   ALKPHOS 70 03/16/2014 1215   ALKPHOS 112 01/29/2014 1700   AST 34 03/16/2014 1215   AST 39* 01/29/2014 1700   ALT 51* 03/16/2014 1215   ALT 59* 01/29/2014 1700   BILITOT 0.80 03/16/2014 1215   BILITOT 1.0 01/29/2014 1700         Impression and Plan: Jacqueline Rivas is 80 year old white female. She has both myeloma and breast cancer. We had Jacqueline Rivas on Herceptin for the breast cancer. She is not a candidate for systemic chemotherapy for breast cancer because of Jacqueline Rivas and performance status after surgery. Unfortunately, she developed a cardiomyopathy. I don't think that the Herceptin was the main reason for this. She is now off Herceptin. She is doing quite well. To me, it appears that Jacqueline Rivas cardiac function is improving.  Jacqueline Rivas myeloma should response to Revlimid again. Had a very nice response first time we used it. However, a lot of other issues going on in the pressure of the Revlimid was really causing side effects for Jacqueline Rivas or if  other problems were an issue.  We will see what Jacqueline Rivas myeloma levels look like.  I want to hold a Zometa for now. Jacqueline Rivas renal function is still a little marginal and I don't want to risk causing further problems.  We will give Jacqueline Rivas Aranesp.  We will see Jacqueline Rivas back in one month.   [name], MD 5/29/20155:15 PM

## 2014-03-16 NOTE — Telephone Encounter (Signed)
Left message with 6-29 appointment. Per MD ok to schedule that day.

## 2014-03-16 NOTE — Progress Notes (Signed)
Per Dr Marin Olp nurse administered Aranesp 368mcg today.

## 2014-03-17 DIAGNOSIS — D630 Anemia in neoplastic disease: Secondary | ICD-10-CM | POA: Diagnosis not present

## 2014-03-17 DIAGNOSIS — Z853 Personal history of malignant neoplasm of breast: Secondary | ICD-10-CM | POA: Diagnosis not present

## 2014-03-17 DIAGNOSIS — I5023 Acute on chronic systolic (congestive) heart failure: Secondary | ICD-10-CM | POA: Diagnosis not present

## 2014-03-17 DIAGNOSIS — N183 Chronic kidney disease, stage 3 unspecified: Secondary | ICD-10-CM | POA: Diagnosis not present

## 2014-03-17 DIAGNOSIS — C9000 Multiple myeloma not having achieved remission: Secondary | ICD-10-CM | POA: Diagnosis not present

## 2014-03-17 DIAGNOSIS — I129 Hypertensive chronic kidney disease with stage 1 through stage 4 chronic kidney disease, or unspecified chronic kidney disease: Secondary | ICD-10-CM | POA: Diagnosis not present

## 2014-03-19 DIAGNOSIS — I129 Hypertensive chronic kidney disease with stage 1 through stage 4 chronic kidney disease, or unspecified chronic kidney disease: Secondary | ICD-10-CM | POA: Diagnosis not present

## 2014-03-19 DIAGNOSIS — D630 Anemia in neoplastic disease: Secondary | ICD-10-CM | POA: Diagnosis not present

## 2014-03-19 DIAGNOSIS — C9000 Multiple myeloma not having achieved remission: Secondary | ICD-10-CM | POA: Diagnosis not present

## 2014-03-19 DIAGNOSIS — N183 Chronic kidney disease, stage 3 unspecified: Secondary | ICD-10-CM | POA: Diagnosis not present

## 2014-03-19 DIAGNOSIS — I5023 Acute on chronic systolic (congestive) heart failure: Secondary | ICD-10-CM | POA: Diagnosis not present

## 2014-03-19 DIAGNOSIS — Z853 Personal history of malignant neoplasm of breast: Secondary | ICD-10-CM | POA: Diagnosis not present

## 2014-03-20 ENCOUNTER — Telehealth: Payer: Self-pay | Admitting: *Deleted

## 2014-03-20 LAB — LACTATE DEHYDROGENASE: LDH: 95 U/L (ref 94–250)

## 2014-03-20 LAB — KAPPA/LAMBDA LIGHT CHAINS
KAPPA FREE LGHT CHN: 41.6 mg/dL — AB (ref 0.33–1.94)
Kappa:Lambda Ratio: 39.25 — ABNORMAL HIGH (ref 0.26–1.65)
Lambda Free Lght Chn: 1.06 mg/dL (ref 0.57–2.63)

## 2014-03-20 LAB — IGG, IGA, IGM
IGG (IMMUNOGLOBIN G), SERUM: 238 mg/dL — AB (ref 690–1700)
IGM, SERUM: 22 mg/dL — AB (ref 52–322)
IgA: 1640 mg/dL — ABNORMAL HIGH (ref 69–380)

## 2014-03-20 LAB — PROTEIN ELECTROPHORESIS, SERUM, WITH REFLEX
Albumin ELP: 50.3 % — ABNORMAL LOW (ref 55.8–66.1)
Alpha-1-Globulin: 5.1 % — ABNORMAL HIGH (ref 2.9–4.9)
Alpha-2-Globulin: 11.8 % (ref 7.1–11.8)
BETA GLOBULIN: 6.7 % (ref 4.7–7.2)
Beta 2: 4.2 % (ref 3.2–6.5)
GAMMA GLOBULIN: 21.9 % — AB (ref 11.1–18.8)
M-SPIKE, %: 0.26 g/dL
Total Protein, Serum Electrophoresis: 7.3 g/dL (ref 6.0–8.3)

## 2014-03-20 LAB — CANCER ANTIGEN 27.29: CA 27.29: 10 U/mL (ref 0–39)

## 2014-03-20 LAB — IFE INTERPRETATION

## 2014-03-20 NOTE — Telephone Encounter (Addendum)
Message copied by Lenn Sink on Tue Mar 20, 2014  4:17 PM ------      Message from: Burney Gauze R      Created: Mon Mar 19, 2014  9:36 PM       Call her son  -- the myeloma levels are down by 40%!!!  Pete ------Let the son know the pt's myeloma levels are down 40%

## 2014-03-21 DIAGNOSIS — N183 Chronic kidney disease, stage 3 unspecified: Secondary | ICD-10-CM | POA: Diagnosis not present

## 2014-03-21 DIAGNOSIS — C9000 Multiple myeloma not having achieved remission: Secondary | ICD-10-CM | POA: Diagnosis not present

## 2014-03-21 DIAGNOSIS — I129 Hypertensive chronic kidney disease with stage 1 through stage 4 chronic kidney disease, or unspecified chronic kidney disease: Secondary | ICD-10-CM | POA: Diagnosis not present

## 2014-03-21 DIAGNOSIS — D630 Anemia in neoplastic disease: Secondary | ICD-10-CM | POA: Diagnosis not present

## 2014-03-21 DIAGNOSIS — I5023 Acute on chronic systolic (congestive) heart failure: Secondary | ICD-10-CM | POA: Diagnosis not present

## 2014-03-21 DIAGNOSIS — Z853 Personal history of malignant neoplasm of breast: Secondary | ICD-10-CM | POA: Diagnosis not present

## 2014-03-22 DIAGNOSIS — Z853 Personal history of malignant neoplasm of breast: Secondary | ICD-10-CM | POA: Diagnosis not present

## 2014-03-22 DIAGNOSIS — N183 Chronic kidney disease, stage 3 unspecified: Secondary | ICD-10-CM | POA: Diagnosis not present

## 2014-03-22 DIAGNOSIS — C9000 Multiple myeloma not having achieved remission: Secondary | ICD-10-CM | POA: Diagnosis not present

## 2014-03-22 DIAGNOSIS — I129 Hypertensive chronic kidney disease with stage 1 through stage 4 chronic kidney disease, or unspecified chronic kidney disease: Secondary | ICD-10-CM | POA: Diagnosis not present

## 2014-03-22 DIAGNOSIS — I5023 Acute on chronic systolic (congestive) heart failure: Secondary | ICD-10-CM | POA: Diagnosis not present

## 2014-03-22 DIAGNOSIS — D630 Anemia in neoplastic disease: Secondary | ICD-10-CM | POA: Diagnosis not present

## 2014-03-23 DIAGNOSIS — I5023 Acute on chronic systolic (congestive) heart failure: Secondary | ICD-10-CM | POA: Diagnosis not present

## 2014-03-23 DIAGNOSIS — I129 Hypertensive chronic kidney disease with stage 1 through stage 4 chronic kidney disease, or unspecified chronic kidney disease: Secondary | ICD-10-CM | POA: Diagnosis not present

## 2014-03-23 DIAGNOSIS — Z853 Personal history of malignant neoplasm of breast: Secondary | ICD-10-CM | POA: Diagnosis not present

## 2014-03-23 DIAGNOSIS — D630 Anemia in neoplastic disease: Secondary | ICD-10-CM | POA: Diagnosis not present

## 2014-03-23 DIAGNOSIS — C9000 Multiple myeloma not having achieved remission: Secondary | ICD-10-CM | POA: Diagnosis not present

## 2014-03-23 DIAGNOSIS — N183 Chronic kidney disease, stage 3 unspecified: Secondary | ICD-10-CM | POA: Diagnosis not present

## 2014-03-26 ENCOUNTER — Other Ambulatory Visit: Payer: Self-pay | Admitting: *Deleted

## 2014-03-26 DIAGNOSIS — C9000 Multiple myeloma not having achieved remission: Secondary | ICD-10-CM | POA: Diagnosis not present

## 2014-03-26 DIAGNOSIS — I5023 Acute on chronic systolic (congestive) heart failure: Secondary | ICD-10-CM | POA: Diagnosis not present

## 2014-03-26 DIAGNOSIS — Z853 Personal history of malignant neoplasm of breast: Secondary | ICD-10-CM | POA: Diagnosis not present

## 2014-03-26 DIAGNOSIS — D630 Anemia in neoplastic disease: Secondary | ICD-10-CM | POA: Diagnosis not present

## 2014-03-26 DIAGNOSIS — I129 Hypertensive chronic kidney disease with stage 1 through stage 4 chronic kidney disease, or unspecified chronic kidney disease: Secondary | ICD-10-CM | POA: Diagnosis not present

## 2014-03-26 DIAGNOSIS — N183 Chronic kidney disease, stage 3 unspecified: Secondary | ICD-10-CM | POA: Diagnosis not present

## 2014-03-26 MED ORDER — LENALIDOMIDE 15 MG PO CAPS: 15.0000 mg | ORAL_CAPSULE | Freq: Every day | ORAL | Status: DC

## 2014-03-28 DIAGNOSIS — D630 Anemia in neoplastic disease: Secondary | ICD-10-CM | POA: Diagnosis not present

## 2014-03-28 DIAGNOSIS — C9000 Multiple myeloma not having achieved remission: Secondary | ICD-10-CM | POA: Diagnosis not present

## 2014-03-28 DIAGNOSIS — I129 Hypertensive chronic kidney disease with stage 1 through stage 4 chronic kidney disease, or unspecified chronic kidney disease: Secondary | ICD-10-CM | POA: Diagnosis not present

## 2014-03-28 DIAGNOSIS — Z853 Personal history of malignant neoplasm of breast: Secondary | ICD-10-CM | POA: Diagnosis not present

## 2014-03-28 DIAGNOSIS — I5023 Acute on chronic systolic (congestive) heart failure: Secondary | ICD-10-CM | POA: Diagnosis not present

## 2014-03-28 DIAGNOSIS — N183 Chronic kidney disease, stage 3 unspecified: Secondary | ICD-10-CM | POA: Diagnosis not present

## 2014-03-29 ENCOUNTER — Ambulatory Visit (HOSPITAL_COMMUNITY)
Admission: RE | Admit: 2014-03-29 | Discharge: 2014-03-29 | Disposition: A | Discharge status: Home / Self Care | Payer: Medicare Other | Source: Ambulatory Visit | Attending: Internal Medicine | Admitting: Internal Medicine

## 2014-03-29 ENCOUNTER — Telehealth: Payer: Self-pay | Admitting: Hematology & Oncology

## 2014-03-29 VITALS — BP 110/78 | HR 91 | Wt 121.0 lb

## 2014-03-29 DIAGNOSIS — I5022 Chronic systolic (congestive) heart failure: Secondary | ICD-10-CM

## 2014-03-29 DIAGNOSIS — I129 Hypertensive chronic kidney disease with stage 1 through stage 4 chronic kidney disease, or unspecified chronic kidney disease: Secondary | ICD-10-CM | POA: Insufficient documentation

## 2014-03-29 DIAGNOSIS — Z859 Personal history of malignant neoplasm, unspecified: Secondary | ICD-10-CM | POA: Insufficient documentation

## 2014-03-29 DIAGNOSIS — I5023 Acute on chronic systolic (congestive) heart failure: Secondary | ICD-10-CM | POA: Insufficient documentation

## 2014-03-29 DIAGNOSIS — Z7982 Long term (current) use of aspirin: Secondary | ICD-10-CM | POA: Diagnosis not present

## 2014-03-29 DIAGNOSIS — Z9221 Personal history of antineoplastic chemotherapy: Secondary | ICD-10-CM | POA: Insufficient documentation

## 2014-03-29 DIAGNOSIS — Z87898 Personal history of other specified conditions: Secondary | ICD-10-CM | POA: Diagnosis not present

## 2014-03-29 DIAGNOSIS — E785 Hyperlipidemia, unspecified: Secondary | ICD-10-CM | POA: Insufficient documentation

## 2014-03-29 DIAGNOSIS — Z66 Do not resuscitate: Secondary | ICD-10-CM | POA: Diagnosis not present

## 2014-03-29 DIAGNOSIS — Z79899 Other long term (current) drug therapy: Secondary | ICD-10-CM | POA: Diagnosis not present

## 2014-03-29 DIAGNOSIS — N183 Chronic kidney disease, stage 3 unspecified: Secondary | ICD-10-CM | POA: Insufficient documentation

## 2014-03-29 DIAGNOSIS — R4181 Age-related cognitive decline: Secondary | ICD-10-CM | POA: Insufficient documentation

## 2014-03-29 DIAGNOSIS — Z853 Personal history of malignant neoplasm of breast: Secondary | ICD-10-CM | POA: Insufficient documentation

## 2014-03-29 DIAGNOSIS — Z923 Personal history of irradiation: Secondary | ICD-10-CM | POA: Diagnosis not present

## 2014-03-29 MED ORDER — CARVEDILOL 3.125 MG PO TABS: 6.2500 mg | ORAL_TABLET | Freq: Two times a day (BID) | ORAL | Status: DC

## 2014-03-29 NOTE — Telephone Encounter (Signed)
Pt aware moved 7-23 back to 6-29 Dr. Marin Olp wants to see her then.

## 2014-03-29 NOTE — Progress Notes (Signed)
Patient ID: Jacqueline Rivas, female   DOB: 10-Mar-1932, 80 y.o.   MRN: [id number] Oncologist: Dr Marin Olp  HPI: Jacqueline Rivas is a 80 y.o. female with a history of multiple myeloma (diagnosed 11/2012 - treated with XRT/chemo), breast CA, CKD and recent diagnosis of systolic HF.   Apparently her EF was normal in September 2014. She was on Herceptin for breast CA with Dr. Marin Olp for 1 year, starting in June 2014. In February, she had an echo which showed an EF of 20-25%. The Herceptin was discontinued. She was admitted last week to [facility] for volume overload. She was diuresed. Discharged to [facility] on 01/22/14. Weight ~127. Discharged on lasix 75m daily. In hospital diuresis was limited by low BP. Incidental finding of lives mass - ? Mets.  She had echo 01/17/14: LVEF 20% RV mildly dilated. Moderate TR.   She was admitted later in in 4/15 from clinic with volume overload.  She was started on milrinone and Lasix gtts with good diuresis.  BP was on the low side so she was not started on beta blocker or ACEI.   She returns for follow up with her son. . Last visit she continued on 3.125 mg carvedilol in am and she increased carvedilol at night to 6.25 mg and 2.5 mg lisinopril added at bed time. Breathing at baseline. Denies orthopnea/PND.  Weight at thome 120 pounds. [facility] following with PT/RN. Taking all medications.    Labs (4/15): Na 137, K 4.2 => 3.9, Cr 1.27 => 1.35 Labs 03/16/14 K 4.1 Creatinine 1.7   Review of Systems: All systems reviewed and negative except as per HPI.   Past Medical History  Diagnosis Date  . Diverticulosis   . Hiatal hernia   . Hyperlipidemia   . Hypertension   . Breast mass   . Multiple myeloma   . Breast cancer   . Arthritis   . S/P radiation therapy 12/15/12 - 12/21/12    T6 - L1 / 18.75 Gy / 5 Fractions  . Bilateral atelectasis   . Systolic heart failure     EF 20% 11/2013  . Protein-calorie malnutrition, severe 01/20/2014  . Chronic kidney disease     stage 3     Current Outpatient Prescriptions  Medication Sig Dispense Refill  . aspirin 325 MG tablet Take 325 mg by mouth daily.      .Marland Kitchendexamethasone (DECADRON) 4 MG tablet Take 5 tablets (20 mg total) by mouth once a week.  120 tablet  3  . digoxin (LANOXIN) 0.125 MG tablet Take 1 tablet (0.125 mg total) by mouth daily.  30 tablet  3  . fentaNYL (DURAGESIC) 12 MCG/HR Place 1 patch (12.5 mcg total) onto the skin every 3 (three) days.  10 patch  0  . HYDROmorphone (DILAUDID) 2 MG tablet Take 2 mg by mouth every 6 (six) hours as needed (pain).       .Marland Kitchenlenalidomide (REVLIMID) 15 MG capsule Take 1 capsule (15 mg total) by mouth daily. Auth # [id number] 21 capsule  4  . lisinopril (PRINIVIL,ZESTRIL) 2.5 MG tablet Take 2.5 mg by mouth daily.       .Marland Kitchenomeprazole (PRILOSEC) 20 MG capsule Take 20 mg by mouth every morning.      . polyethylene glycol (MIRALAX / GLYCOLAX) packet Take 17 g by mouth daily.      . potassium chloride SA (K-DUR,KLOR-CON) 20 MEQ tablet Take 20 mEq by mouth daily.      .Marland Kitchenspironolactone (ALDACTONE) 25 MG tablet Take  1 tablet (25 mg total) by mouth daily.  30 tablet  6  . torsemide (DEMADEX) 20 MG tablet Take 1 tablet (20 mg total) by mouth daily.  30 tablet  3  . calcium carbonate (OS-CAL) 600 MG TABS tablet Take 1,200 mg by mouth daily with breakfast.      . Coenzyme Q10 (CO Q 10) 100 MG CAPS Take 100 mg by mouth every morning.        No current facility-administered medications for this encounter.    No Known Allergies  History   Social History  . Marital Status: Widowed    Spouse Name: N/A    Number of Children: N/A  . Years of Education: N/A   Occupational History  . Retired    Social History Main Topics  . Smoking status: Never Smoker   . Smokeless tobacco: Never Used     Comment: never used tobacco  . Alcohol Use: No     Comment: heavy alcohol use until 2013  . Drug Use: No  . Sexual Activity: No   Other Topics Concern  . Not on file   Social History  Narrative   Has apartment, lives alone.  Uses a cane around apartment.  Has walker. Drives.      Family History  Problem Relation Age of Onset  . Prostate cancer Father   . Heart attack Brother   . Asthma Maternal Grandmother   . Multiple sclerosis Sister   . Lymphoma Son     large B-cell  . Heart attack Father     PHYSICAL EXAM: Filed Vitals:   03/29/14 1333  BP: 110/78  Pulse: 91  Weight: 121 lb (54.885 kg)  SpO2: 97%    General:  Chronically ill appearing. Frail.  Son present  HEENT: normal Neck: supple. JVP 5-6 cm. Carotids 2+ bilat; no bruits. No lymphadenopathy or thyromegaly appreciated. Cor: PMI laterally displaced. Regular rate & rhythm. No S3. 2/6 TR murmur. Lungs: decreased at bases Abdomen: soft, nontender, non distended. No hepatosplenomegaly. No bruits or masses. Good bowel sounds. Extremities: no cyanosis, clubbing, rash.  No edema.  She is wearing compression stockings.  Neuro: alert & oriented x 3, cranial nerves grossly intact. moves all 4 extremities w/o difficulty. Affect pleasant.  ASSESSMENT & PLAN: 1. Acute on chronic systolic HF:  Suspect nonischemic cardiomyopathy possibly due to Herceptin use, EF 20%.   NYHA II-III.  Volume status stable. Continue current dose of torsemide and spironolactone.  Increase carvedilol to 6.25 mg in am and 6.25 mg in pm.    - Continue digoxin and spironolactone at current doses.  Continue lisinopril 2.5 qhs.  -Follow up in 1 month  with an ECHO 2. CKD: Stable, follow creatinine carefully.  3. Breast CA: Per Dr Marin Olp, would avoid Herceptin in future.  4. Multiple myeloma: Per Dr. Marin Olp.  5. DNR/DNI  Follow up in 1 month with an ECHO  Saroya Riccobono NP-C  03/29/2014

## 2014-03-29 NOTE — Telephone Encounter (Signed)
Pt moved 6-29 to 7-23 due to son's schedule RN aware

## 2014-03-29 NOTE — Patient Instructions (Signed)
Follow up in 1 month with an ECHO  Take carvedilol 6.25 mg twice a day  Do the following things EVERYDAY: 1) Weigh yourself in the morning before breakfast. Write it down and keep it in a log. 2) Take your medicines as prescribed 3) Eat low salt foods-Limit salt (sodium) to 2000 mg per day.  4) Stay as active as you can everyday 5) Limit all fluids for the day to less than 2 liters

## 2014-04-03 DIAGNOSIS — D630 Anemia in neoplastic disease: Secondary | ICD-10-CM | POA: Diagnosis not present

## 2014-04-03 DIAGNOSIS — I5023 Acute on chronic systolic (congestive) heart failure: Secondary | ICD-10-CM | POA: Diagnosis not present

## 2014-04-03 DIAGNOSIS — Z853 Personal history of malignant neoplasm of breast: Secondary | ICD-10-CM | POA: Diagnosis not present

## 2014-04-03 DIAGNOSIS — I129 Hypertensive chronic kidney disease with stage 1 through stage 4 chronic kidney disease, or unspecified chronic kidney disease: Secondary | ICD-10-CM | POA: Diagnosis not present

## 2014-04-03 DIAGNOSIS — N183 Chronic kidney disease, stage 3 unspecified: Secondary | ICD-10-CM | POA: Diagnosis not present

## 2014-04-03 DIAGNOSIS — C9000 Multiple myeloma not having achieved remission: Secondary | ICD-10-CM | POA: Diagnosis not present

## 2014-04-04 DIAGNOSIS — C9000 Multiple myeloma not having achieved remission: Secondary | ICD-10-CM | POA: Diagnosis not present

## 2014-04-04 DIAGNOSIS — I129 Hypertensive chronic kidney disease with stage 1 through stage 4 chronic kidney disease, or unspecified chronic kidney disease: Secondary | ICD-10-CM | POA: Diagnosis not present

## 2014-04-04 DIAGNOSIS — I5023 Acute on chronic systolic (congestive) heart failure: Secondary | ICD-10-CM | POA: Diagnosis not present

## 2014-04-04 DIAGNOSIS — D630 Anemia in neoplastic disease: Secondary | ICD-10-CM | POA: Diagnosis not present

## 2014-04-04 DIAGNOSIS — Z853 Personal history of malignant neoplasm of breast: Secondary | ICD-10-CM | POA: Diagnosis not present

## 2014-04-04 DIAGNOSIS — N183 Chronic kidney disease, stage 3 unspecified: Secondary | ICD-10-CM | POA: Diagnosis not present

## 2014-04-06 DIAGNOSIS — N183 Chronic kidney disease, stage 3 unspecified: Secondary | ICD-10-CM | POA: Diagnosis not present

## 2014-04-06 DIAGNOSIS — C9000 Multiple myeloma not having achieved remission: Secondary | ICD-10-CM | POA: Diagnosis not present

## 2014-04-06 DIAGNOSIS — Z853 Personal history of malignant neoplasm of breast: Secondary | ICD-10-CM | POA: Diagnosis not present

## 2014-04-06 DIAGNOSIS — I5023 Acute on chronic systolic (congestive) heart failure: Secondary | ICD-10-CM | POA: Diagnosis not present

## 2014-04-06 DIAGNOSIS — I129 Hypertensive chronic kidney disease with stage 1 through stage 4 chronic kidney disease, or unspecified chronic kidney disease: Secondary | ICD-10-CM | POA: Diagnosis not present

## 2014-04-06 DIAGNOSIS — D630 Anemia in neoplastic disease: Secondary | ICD-10-CM | POA: Diagnosis not present

## 2014-04-09 DIAGNOSIS — Z853 Personal history of malignant neoplasm of breast: Secondary | ICD-10-CM | POA: Diagnosis not present

## 2014-04-09 DIAGNOSIS — C9000 Multiple myeloma not having achieved remission: Secondary | ICD-10-CM | POA: Diagnosis not present

## 2014-04-09 DIAGNOSIS — I5023 Acute on chronic systolic (congestive) heart failure: Secondary | ICD-10-CM | POA: Diagnosis not present

## 2014-04-09 DIAGNOSIS — I129 Hypertensive chronic kidney disease with stage 1 through stage 4 chronic kidney disease, or unspecified chronic kidney disease: Secondary | ICD-10-CM | POA: Diagnosis not present

## 2014-04-09 DIAGNOSIS — D630 Anemia in neoplastic disease: Secondary | ICD-10-CM | POA: Diagnosis not present

## 2014-04-09 DIAGNOSIS — N183 Chronic kidney disease, stage 3 unspecified: Secondary | ICD-10-CM | POA: Diagnosis not present

## 2014-04-11 DIAGNOSIS — I5023 Acute on chronic systolic (congestive) heart failure: Secondary | ICD-10-CM | POA: Diagnosis not present

## 2014-04-11 DIAGNOSIS — C9000 Multiple myeloma not having achieved remission: Secondary | ICD-10-CM | POA: Diagnosis not present

## 2014-04-11 DIAGNOSIS — I129 Hypertensive chronic kidney disease with stage 1 through stage 4 chronic kidney disease, or unspecified chronic kidney disease: Secondary | ICD-10-CM | POA: Diagnosis not present

## 2014-04-11 DIAGNOSIS — Z853 Personal history of malignant neoplasm of breast: Secondary | ICD-10-CM | POA: Diagnosis not present

## 2014-04-11 DIAGNOSIS — D630 Anemia in neoplastic disease: Secondary | ICD-10-CM | POA: Diagnosis not present

## 2014-04-11 DIAGNOSIS — N183 Chronic kidney disease, stage 3 unspecified: Secondary | ICD-10-CM | POA: Diagnosis not present

## 2014-04-13 DIAGNOSIS — I129 Hypertensive chronic kidney disease with stage 1 through stage 4 chronic kidney disease, or unspecified chronic kidney disease: Secondary | ICD-10-CM | POA: Diagnosis not present

## 2014-04-13 DIAGNOSIS — Z853 Personal history of malignant neoplasm of breast: Secondary | ICD-10-CM | POA: Diagnosis not present

## 2014-04-13 DIAGNOSIS — N183 Chronic kidney disease, stage 3 unspecified: Secondary | ICD-10-CM | POA: Diagnosis not present

## 2014-04-13 DIAGNOSIS — I5023 Acute on chronic systolic (congestive) heart failure: Secondary | ICD-10-CM | POA: Diagnosis not present

## 2014-04-13 DIAGNOSIS — C9000 Multiple myeloma not having achieved remission: Secondary | ICD-10-CM | POA: Diagnosis not present

## 2014-04-13 DIAGNOSIS — D630 Anemia in neoplastic disease: Secondary | ICD-10-CM | POA: Diagnosis not present

## 2014-04-16 ENCOUNTER — Other Ambulatory Visit: Payer: Self-pay | Admitting: *Deleted

## 2014-04-16 ENCOUNTER — Ambulatory Visit (HOSPITAL_BASED_OUTPATIENT_CLINIC_OR_DEPARTMENT_OTHER): Payer: Medicare Other | Admitting: Hematology & Oncology

## 2014-04-16 ENCOUNTER — Ambulatory Visit (HOSPITAL_BASED_OUTPATIENT_CLINIC_OR_DEPARTMENT_OTHER): Payer: Medicare Other | Admitting: Lab

## 2014-04-16 ENCOUNTER — Encounter: Payer: Self-pay | Admitting: Hematology & Oncology

## 2014-04-16 ENCOUNTER — Ambulatory Visit (HOSPITAL_BASED_OUTPATIENT_CLINIC_OR_DEPARTMENT_OTHER): Payer: Medicare Other

## 2014-04-16 ENCOUNTER — Other Ambulatory Visit: Payer: Medicare Other | Admitting: Lab

## 2014-04-16 ENCOUNTER — Ambulatory Visit: Payer: Medicare Other

## 2014-04-16 ENCOUNTER — Ambulatory Visit: Payer: Medicare Other | Admitting: Hematology & Oncology

## 2014-04-16 VITALS — BP 108/50 | HR 76 | Temp 98.4°F | Resp 14 | Ht 65.0 in | Wt 126.0 lb

## 2014-04-16 DIAGNOSIS — I428 Other cardiomyopathies: Secondary | ICD-10-CM | POA: Diagnosis not present

## 2014-04-16 DIAGNOSIS — D631 Anemia in chronic kidney disease: Secondary | ICD-10-CM

## 2014-04-16 DIAGNOSIS — C9000 Multiple myeloma not having achieved remission: Secondary | ICD-10-CM | POA: Diagnosis not present

## 2014-04-16 DIAGNOSIS — N183 Chronic kidney disease, stage 3 unspecified: Secondary | ICD-10-CM | POA: Diagnosis not present

## 2014-04-16 DIAGNOSIS — C50919 Malignant neoplasm of unspecified site of unspecified female breast: Secondary | ICD-10-CM

## 2014-04-16 DIAGNOSIS — I5043 Acute on chronic combined systolic (congestive) and diastolic (congestive) heart failure: Secondary | ICD-10-CM | POA: Diagnosis not present

## 2014-04-16 DIAGNOSIS — Z171 Estrogen receptor negative status [ER-]: Secondary | ICD-10-CM | POA: Diagnosis not present

## 2014-04-16 DIAGNOSIS — N039 Chronic nephritic syndrome with unspecified morphologic changes: Secondary | ICD-10-CM

## 2014-04-16 LAB — CBC WITH DIFFERENTIAL (CANCER CENTER ONLY)
BASO#: 0.1 10*3/uL (ref 0.0–0.2)
BASO%: 1.9 % (ref 0.0–2.0)
EOS ABS: 0.4 10*3/uL (ref 0.0–0.5)
EOS%: 5.7 % (ref 0.0–7.0)
HCT: 31.1 % — ABNORMAL LOW (ref 34.8–46.6)
HEMOGLOBIN: 10.3 g/dL — AB (ref 11.6–15.9)
LYMPH#: 0.8 10*3/uL — AB (ref 0.9–3.3)
LYMPH%: 10.7 % — ABNORMAL LOW (ref 14.0–48.0)
MCH: 34.8 pg — ABNORMAL HIGH (ref 26.0–34.0)
MCHC: 33.1 g/dL (ref 32.0–36.0)
MCV: 105 fL — ABNORMAL HIGH (ref 81–101)
MONO#: 0.4 10*3/uL (ref 0.1–0.9)
MONO%: 5.3 % (ref 0.0–13.0)
NEUT%: 76.4 % (ref 39.6–80.0)
NEUTROS ABS: 5.4 10*3/uL (ref 1.5–6.5)
Platelets: 181 10*3/uL (ref 145–400)
RBC: 2.96 10*6/uL — AB (ref 3.70–5.32)
RDW: 17.2 % — ABNORMAL HIGH (ref 11.1–15.7)
WBC: 7 10*3/uL (ref 3.9–10.0)

## 2014-04-16 LAB — CMP (CANCER CENTER ONLY)
ALBUMIN: 3.1 g/dL — AB (ref 3.3–5.5)
ALK PHOS: 63 U/L (ref 26–84)
ALT: 40 U/L (ref 10–47)
AST: 28 U/L (ref 11–38)
BILIRUBIN TOTAL: 0.9 mg/dL (ref 0.20–1.60)
BUN, Bld: 43 mg/dL — ABNORMAL HIGH (ref 7–22)
CO2: 26 meq/L (ref 18–33)
Calcium: 9.7 mg/dL (ref 8.0–10.3)
Chloride: 101 mEq/L (ref 98–108)
Creat: 1.9 mg/dl — ABNORMAL HIGH (ref 0.6–1.2)
Glucose, Bld: 104 mg/dL (ref 73–118)
POTASSIUM: 4.5 meq/L (ref 3.3–4.7)
SODIUM: 142 meq/L (ref 128–145)
TOTAL PROTEIN: 7.6 g/dL (ref 6.4–8.1)

## 2014-04-16 LAB — CHCC SATELLITE - SMEAR

## 2014-04-16 MED ORDER — FENTANYL 12 MCG/HR TD PT72: 12.5000 ug | MEDICATED_PATCH | TRANSDERMAL | Status: DC

## 2014-04-16 MED ORDER — LISINOPRIL 2.5 MG PO TABS: 2.5000 mg | ORAL_TABLET | Freq: Every day | ORAL | Status: DC

## 2014-04-16 MED ORDER — DARBEPOETIN ALFA-POLYSORBATE 300 MCG/0.6ML IJ SOLN
300.0000 ug | Freq: Once | INTRAMUSCULAR | Status: AC
  Administered 2014-04-16: 300 ug via SUBCUTANEOUS

## 2014-04-16 MED ORDER — POTASSIUM CHLORIDE CRYS ER 20 MEQ PO TBCR
20.0000 meq | EXTENDED_RELEASE_TABLET | Freq: Every day | ORAL | Status: DC
Start: 2014-04-16 — End: 2015-08-26

## 2014-04-16 MED ORDER — DARBEPOETIN ALFA-POLYSORBATE 300 MCG/0.6ML IJ SOLN
INTRAMUSCULAR | Status: AC
  Filled 2014-04-16: qty 0.6

## 2014-04-16 MED ORDER — HEPARIN SOD (PORK) LOCK FLUSH 100 UNIT/ML IV SOLN
500.0000 [IU] | Freq: Once | INTRAVENOUS | Status: AC | PRN
  Administered 2014-04-16: 500 [IU]
  Filled 2014-04-16: qty 5

## 2014-04-16 MED ORDER — OMEPRAZOLE 20 MG PO CPDR: 20.0000 mg | DELAYED_RELEASE_CAPSULE | Freq: Every morning | ORAL | Status: DC

## 2014-04-16 MED ORDER — SODIUM CHLORIDE 0.9 % IJ SOLN
10.0000 mL | INTRAMUSCULAR | Status: DC | PRN
  Administered 2014-04-16: 10 mL
  Filled 2014-04-16: qty 10

## 2014-04-16 NOTE — Patient Instructions (Signed)
Darbepoetin Alfa injection What is this medicine? DARBEPOETIN ALFA (dar be POE e tin AL fa) helps your body make more red blood cells. It is used to treat anemia caused by chronic kidney failure and chemotherapy. This medicine may be used for other purposes; ask your health care Master Touchet or pharmacist if you have questions. COMMON BRAND NAME(S): Aranesp What should I tell my health care Kyrie Fludd before I take this medicine? They need to know if you have any of these conditions: -blood clotting disorders or history of blood clots -cancer patient not on chemotherapy -cystic fibrosis -heart disease, such as angina, heart failure, or a history of a heart attack -hemoglobin level of 12 g/dL or greater -high blood pressure -low levels of folate, iron, or vitamin B12 -seizures -an unusual or allergic reaction to darbepoetin, erythropoietin, albumin, hamster proteins, latex, other medicines, foods, dyes, or preservatives -pregnant or trying to get pregnant -breast-feeding How should I use this medicine? This medicine is for injection into a vein or under the skin. It is usually given by a health care professional in a hospital or clinic setting. If you get this medicine at home, you will be taught how to prepare and give this medicine. Do not shake the solution before you withdraw a dose. Use exactly as directed. Take your medicine at regular intervals. Do not take your medicine more often than directed. It is important that you put your used needles and syringes in a special sharps container. Do not put them in a trash can. If you do not have a sharps container, call your pharmacist or healthcare Kyheem Bathgate to get one. Talk to your pediatrician regarding the use of this medicine in children. While this medicine may be used in children as young as 1 year for selected conditions, precautions do apply. Overdosage: If you think you have taken too much of this medicine contact a poison control center or  emergency room at once. NOTE: This medicine is only for you. Do not share this medicine with others. What if I miss a dose? If you miss a dose, take it as soon as you can. If it is almost time for your next dose, take only that dose. Do not take double or extra doses. What may interact with this medicine? Do not take this medicine with any of the following medications: -epoetin alfa This list may not describe all possible interactions. Give your health care Hisao Doo a list of all the medicines, herbs, non-prescription drugs, or dietary supplements you use. Also tell them if you smoke, drink alcohol, or use illegal drugs. Some items may interact with your medicine. What should I watch for while using this medicine? Visit your prescriber or health care professional for regular checks on your progress and for the needed blood tests and blood pressure measurements. It is especially important for the doctor to make sure your hemoglobin level is in the desired range, to limit the risk of potential side effects and to give you the best benefit. Keep all appointments for any recommended tests. Check your blood pressure as directed. Ask your doctor what your blood pressure should be and when you should contact him or her. As your body makes more red blood cells, you may need to take iron, folic acid, or vitamin B supplements. Ask your doctor or health care Reymond Maynez which products are right for you. If you have kidney disease continue dietary restrictions, even though this medication can make you feel better. Talk with your doctor or health   care professional about the foods you eat and the vitamins that you take. What side effects may I notice from receiving this medicine? Side effects that you should report to your doctor or health care professional as soon as possible: -allergic reactions like skin rash, itching or hives, swelling of the face, lips, or tongue -breathing problems -changes in vision -chest  pain -confusion, trouble speaking or understanding -feeling faint or lightheaded, falls -high blood pressure -muscle aches or pains -pain, swelling, warmth in the leg -rapid weight gain -severe headaches -sudden numbness or weakness of the face, arm or leg -trouble walking, dizziness, loss of balance or coordination -seizures (convulsions) -swelling of the ankles, feet, hands -unusually weak or tired Side effects that usually do not require medical attention (report to your doctor or health care professional if they continue or are bothersome): -diarrhea -fever, chills (flu-like symptoms) -headaches -nausea, vomiting -redness, stinging, or swelling at site where injected This list may not describe all possible side effects. Call your doctor for medical advice about side effects. You may report side effects to FDA at 1-800-FDA-1088. Where should I keep my medicine? Keep out of the reach of children. Store in a refrigerator between 2 and 8 degrees C (36 and 46 degrees F). Do not freeze. Do not shake. Throw away any unused portion if using a single-dose vial. Throw away any unused medicine after the expiration date. NOTE: This sheet is a summary. It may not cover all possible information. If you have questions about this medicine, talk to your doctor, pharmacist, or health care Vandella Ord.  2014, Elsevier/Gold Standard. (2008-09-18 10:23:57)  

## 2014-04-17 LAB — IRON AND TIBC CHCC
%SAT: 40 % (ref 21–57)
Iron: 121 ug/dL (ref 41–142)
TIBC: 301 ug/dL (ref 236–444)
UIBC: 181 ug/dL (ref 120–384)

## 2014-04-17 LAB — FERRITIN CHCC: Ferritin: 150 ng/ml (ref 9–269)

## 2014-04-17 NOTE — Progress Notes (Signed)
Hematology and Oncology Follow Up Visit  Mercedees Convery [id number] 03-31-1932 80 y.o. 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  Assad is back for followup. She looks better. She looks more vigorous. She is more strong. She is at home. It seems as if her cardiac function might be improving.  She is on Revlimid. She's been doing okay with the Revlimid. So far, we had seen a pretty good response. We saw her back in May, her monoclonal spike was 1.55 g/dL. Her IgA level was 1640 mg/dL. Her kappa light chain  was 41.6 mg/dL.  Sometimes it tough to know issues will take her medications. Other she's doing pretty well with this.  She did not think much of in the way of pain. There is no increased shortness of breath. She's had no fever sweats or chills. She's had no bleeding.  She has her cardiac function reevaluated in about 3 weeks.       Medications: Current outpatient prescriptions:aspirin 325 MG tablet, Take 325 mg by mouth daily., Disp: , Rfl: ;  calcium carbonate (OS-CAL) 600 MG TABS tablet, Take 1,200 mg by mouth daily with breakfast., Disp: , Rfl: ;  carvedilol (COREG) 3.125 MG tablet, Take 2 tablets (6.25 mg total) by mouth 2 (two) times daily with a meal., Disp: 120 tablet, Rfl: 3;  Coenzyme Q10 (CO Q 10) 100 MG CAPS, Take 100 mg by mouth every morning. , Disp: , Rfl:  dexamethasone (DECADRON) 4 MG tablet, Take 5 tablets (20 mg total) by mouth once a week., Disp: 120 tablet, Rfl: 3;  digoxin (LANOXIN) 0.125 MG tablet, Take 1 tablet (0.125 mg total) by mouth daily., Disp: 30 tablet, Rfl: 3;  HYDROmorphone (DILAUDID) 2 MG tablet, Take 2 mg by  mouth every 6 (six) hours as needed (pain). , Disp: , Rfl:  lansoprazole (PREVACID) 30 MG capsule, Take 30 mg by mouth daily at 12 noon. Pt has not been taking daily., Disp: , Rfl: ;  lenalidomide (REVLIMID) 15 MG capsule, Take 1 capsule (15 mg total) by mouth daily. Auth # [id number], Disp: 21 capsule, Rfl: 4;  polyethylene glycol (MIRALAX / GLYCOLAX) packet, Take 17 g by mouth daily., Disp: , Rfl: ;  spironolactone (ALDACTONE) 25 MG tablet, Take 1 tablet (25 mg total) by mouth daily., Disp: 30 tablet, Rfl: 6 torsemide (DEMADEX) 20 MG tablet, Take 1 tablet (20 mg total) by mouth daily., Disp: 30 tablet, Rfl: 3;  fentaNYL (DURAGESIC) 12 MCG/HR, Place 1 patch (12.5 mcg total) onto the skin every 3 (three) days., Disp: 10 patch, Rfl: 0;  lisinopril (PRINIVIL,ZESTRIL) 2.5 MG tablet, Take 1 tablet (2.5 mg total) by mouth daily., Disp: 30 tablet, Rfl: 3 omeprazole (PRILOSEC) 20 MG capsule, Take 1 capsule (20 mg total) by mouth every morning., Disp: 30 capsule, Rfl: 0;  potassium chloride SA (K-DUR,KLOR-CON) 20 MEQ tablet, Take 1 tablet (20 mEq total) by mouth daily., Disp: 30 tablet, Rfl: 3 No current facility-administered medications for this visit. Facility-Administered Medications Ordered in Other Visits: sodium chloride 0.9 % injection 10 mL, 10 mL, Intracatheter, PRN, [name], MD, 10 mL at 04/16/14 1332  Allergies: No Known Allergies  Past Medical History, Surgical history, Social history, and Family History were reviewed and updated.  Review of Systems: As above  Physical Exam:  height is 5' 5"  (1.651 m) and weight is 126 lb (57.153 kg). Her oral temperature is 98.4 F (36.9 C). Her blood pressure is 108/50 and her pulse is 76. Her respiration is 14.   Elderly, somewhat petite white female in no obvious distress. Head and neck exam shows no ocular or oral lesion. There are no palpable cervical or supraclavicular lymph nodes. Lungs are clear. Cardiac exam regular rate and rhythm with no  murmurs rubs or bruits. Abdomen soft. She is good bowel sounds. There is no fluid wave. Is no palpable liver or spleen tip. Extremities shows no clubbing cyanosis or edema. Skin exam no rashes. Neurological exam is nonfocal.  Lab Results  Component Value Date   WBC 7.0 04/16/2014   HGB 10.3* 04/16/2014   HCT 31.1* 04/16/2014   MCV 105* 04/16/2014   PLT 181 04/16/2014     Chemistry      Component Value Date/Time   NA 142 04/16/2014 1044   NA 134* 02/27/2014 1213   K 4.5 04/16/2014 1044   K 5.0 02/27/2014 1213   CL 101 04/16/2014 1044   CL 97 02/27/2014 1213   CO2 26 04/16/2014 1044   CO2 22 02/27/2014 1213   BUN 43* 04/16/2014 1044   BUN 41* 02/27/2014 1213   CREATININE 1.9* 04/16/2014 1044   CREATININE 1.72* 02/27/2014 1213      Component Value Date/Time   CALCIUM 9.7 04/16/2014 1044   CALCIUM 9.7 02/27/2014 1213   CALCIUM 11.1* 11/13/2012 1526   ALKPHOS 63 04/16/2014 1044   ALKPHOS 112 01/29/2014 1700   AST 28 04/16/2014 1044   AST 39* 01/29/2014 1700   ALT 40 04/16/2014 1044   ALT 59* 01/29/2014 1700   BILITOT 0.90 04/16/2014 1044   BILITOT 1.0 01/29/2014 1700         Impression and Plan: Ms. Mette is 80 year old female with both IgA kappa myeloma. She'll she has stage IIB breast cancer. Her breast cancer is ER negative. It is HER-2 positive. She developed cardiomyopathy. I can only think of the cardiomyopathy was from the Herceptin.  So far, no see any obvious indication for the breast cancer to have recurred.  We will see what her myeloma studies show. He looks better. She has a better performance status. I would have to think that everything is getting better. We should hopefully continue to see the monoclonal spike continue to drop.  We will hold or Zometa today. Her renal function is a little bit too low from my point of view. Some of this might be from aggressive diuresis.  She is on quite a few medications. Thankfully, these are all from her cardiologist. I told her that she really  needs to talk to her cardiologist about adjusted these.  I'll see her back in another 4 or 5 weeks. I will give her an Aranesp dose today.   [name], MD 6/30/20157:03 AM

## 2014-04-18 ENCOUNTER — Encounter: Payer: Self-pay | Admitting: *Deleted

## 2014-04-18 DIAGNOSIS — I5023 Acute on chronic systolic (congestive) heart failure: Secondary | ICD-10-CM | POA: Diagnosis not present

## 2014-04-18 DIAGNOSIS — I129 Hypertensive chronic kidney disease with stage 1 through stage 4 chronic kidney disease, or unspecified chronic kidney disease: Secondary | ICD-10-CM | POA: Diagnosis not present

## 2014-04-18 DIAGNOSIS — Z853 Personal history of malignant neoplasm of breast: Secondary | ICD-10-CM | POA: Diagnosis not present

## 2014-04-18 DIAGNOSIS — N183 Chronic kidney disease, stage 3 unspecified: Secondary | ICD-10-CM | POA: Diagnosis not present

## 2014-04-18 DIAGNOSIS — D630 Anemia in neoplastic disease: Secondary | ICD-10-CM | POA: Diagnosis not present

## 2014-04-18 DIAGNOSIS — C9000 Multiple myeloma not having achieved remission: Secondary | ICD-10-CM | POA: Diagnosis not present

## 2014-04-19 ENCOUNTER — Telehealth: Payer: Self-pay | Admitting: *Deleted

## 2014-04-19 DIAGNOSIS — C9000 Multiple myeloma not having achieved remission: Secondary | ICD-10-CM | POA: Diagnosis not present

## 2014-04-19 DIAGNOSIS — D630 Anemia in neoplastic disease: Secondary | ICD-10-CM | POA: Diagnosis not present

## 2014-04-19 DIAGNOSIS — I129 Hypertensive chronic kidney disease with stage 1 through stage 4 chronic kidney disease, or unspecified chronic kidney disease: Secondary | ICD-10-CM | POA: Diagnosis not present

## 2014-04-19 DIAGNOSIS — Z853 Personal history of malignant neoplasm of breast: Secondary | ICD-10-CM | POA: Diagnosis not present

## 2014-04-19 DIAGNOSIS — N183 Chronic kidney disease, stage 3 unspecified: Secondary | ICD-10-CM | POA: Diagnosis not present

## 2014-04-19 DIAGNOSIS — I5023 Acute on chronic systolic (congestive) heart failure: Secondary | ICD-10-CM | POA: Diagnosis not present

## 2014-04-19 LAB — PROTEIN ELECTROPHORESIS, SERUM, WITH REFLEX
ALBUMIN ELP: 51.3 % — AB (ref 55.8–66.1)
ALPHA-1-GLOBULIN: 4.7 % (ref 2.9–4.9)
ALPHA-2-GLOBULIN: 10.9 % (ref 7.1–11.8)
BETA GLOBULIN: 6.9 % (ref 4.7–7.2)
Beta 2: 3.2 % (ref 3.2–6.5)
Gamma Globulin: 23 % — ABNORMAL HIGH (ref 11.1–18.8)
M-Spike, %: 0.15 g/dL
Total Protein, Serum Electrophoresis: 6.7 g/dL (ref 6.0–8.3)

## 2014-04-19 LAB — IGG, IGA, IGM
IGM, SERUM: 23 mg/dL — AB (ref 52–322)
IgA: 1620 mg/dL — ABNORMAL HIGH (ref 69–380)
IgG (Immunoglobin G), Serum: 242 mg/dL — ABNORMAL LOW (ref 690–1700)

## 2014-04-19 LAB — KAPPA/LAMBDA LIGHT CHAINS
KAPPA FREE LGHT CHN: 19.6 mg/dL — AB (ref 0.33–1.94)
KAPPA LAMBDA RATIO: 14.52 — AB (ref 0.26–1.65)
LAMBDA FREE LGHT CHN: 1.35 mg/dL (ref 0.57–2.63)

## 2014-04-19 LAB — IFE INTERPRETATION

## 2014-04-19 LAB — LACTATE DEHYDROGENASE: LDH: 104 U/L (ref 94–250)

## 2014-04-19 NOTE — Telephone Encounter (Addendum)
Message copied by Lenn Sink on Thu Apr 19, 2014  9:37 AM ------      Message from: Burney Gauze R      Created: Wed Apr 18, 2014  6:58 PM       Call her son, Romilda Garret -- the myeloma continues to come down!!  The level is now 1.34g/L.  Revlimid is working!!  Film/video editor ------Left voicemail to inform son that the myeloma continues to come down!!  The level is now 1.34g/L.  Revlimid is working!

## 2014-04-20 DIAGNOSIS — I129 Hypertensive chronic kidney disease with stage 1 through stage 4 chronic kidney disease, or unspecified chronic kidney disease: Secondary | ICD-10-CM | POA: Diagnosis not present

## 2014-04-20 DIAGNOSIS — C9000 Multiple myeloma not having achieved remission: Secondary | ICD-10-CM | POA: Diagnosis not present

## 2014-04-20 DIAGNOSIS — I5023 Acute on chronic systolic (congestive) heart failure: Secondary | ICD-10-CM | POA: Diagnosis not present

## 2014-04-20 DIAGNOSIS — N183 Chronic kidney disease, stage 3 unspecified: Secondary | ICD-10-CM | POA: Diagnosis not present

## 2014-04-20 DIAGNOSIS — Z853 Personal history of malignant neoplasm of breast: Secondary | ICD-10-CM | POA: Diagnosis not present

## 2014-04-20 DIAGNOSIS — D630 Anemia in neoplastic disease: Secondary | ICD-10-CM | POA: Diagnosis not present

## 2014-04-24 ENCOUNTER — Other Ambulatory Visit: Payer: Self-pay | Admitting: *Deleted

## 2014-04-24 DIAGNOSIS — C9000 Multiple myeloma not having achieved remission: Secondary | ICD-10-CM

## 2014-04-24 MED ORDER — LENALIDOMIDE 15 MG PO CAPS: 15.0000 mg | ORAL_CAPSULE | Freq: Every day | ORAL | Status: DC

## 2014-04-30 ENCOUNTER — Ambulatory Visit (HOSPITAL_COMMUNITY)
Admission: RE | Admit: 2014-04-30 | Discharge: 2014-04-30 | Disposition: A | Discharge status: Home / Self Care | Payer: Medicare Other | Source: Ambulatory Visit | Attending: Adult Health | Admitting: Adult Health

## 2014-04-30 ENCOUNTER — Encounter (HOSPITAL_COMMUNITY): Payer: Self-pay

## 2014-04-30 ENCOUNTER — Ambulatory Visit (HOSPITAL_BASED_OUTPATIENT_CLINIC_OR_DEPARTMENT_OTHER)
Admission: RE | Admit: 2014-04-30 | Discharge: 2014-04-30 | Disposition: A | Discharge status: Home / Self Care | Payer: Medicare Other | Source: Ambulatory Visit | Attending: Internal Medicine | Admitting: Internal Medicine

## 2014-04-30 VITALS — BP 108/52 | HR 60

## 2014-04-30 DIAGNOSIS — I5043 Acute on chronic combined systolic (congestive) and diastolic (congestive) heart failure: Secondary | ICD-10-CM | POA: Diagnosis not present

## 2014-04-30 DIAGNOSIS — N183 Chronic kidney disease, stage 3 unspecified: Secondary | ICD-10-CM

## 2014-04-30 DIAGNOSIS — I359 Nonrheumatic aortic valve disorder, unspecified: Secondary | ICD-10-CM | POA: Insufficient documentation

## 2014-04-30 DIAGNOSIS — C9000 Multiple myeloma not having achieved remission: Secondary | ICD-10-CM | POA: Diagnosis not present

## 2014-04-30 DIAGNOSIS — I079 Rheumatic tricuspid valve disease, unspecified: Secondary | ICD-10-CM | POA: Diagnosis not present

## 2014-04-30 DIAGNOSIS — I059 Rheumatic mitral valve disease, unspecified: Secondary | ICD-10-CM | POA: Insufficient documentation

## 2014-04-30 DIAGNOSIS — I379 Nonrheumatic pulmonary valve disorder, unspecified: Secondary | ICD-10-CM | POA: Insufficient documentation

## 2014-04-30 DIAGNOSIS — I5022 Chronic systolic (congestive) heart failure: Secondary | ICD-10-CM | POA: Insufficient documentation

## 2014-04-30 DIAGNOSIS — C50919 Malignant neoplasm of unspecified site of unspecified female breast: Secondary | ICD-10-CM | POA: Insufficient documentation

## 2014-04-30 DIAGNOSIS — I428 Other cardiomyopathies: Secondary | ICD-10-CM

## 2014-04-30 LAB — BASIC METABOLIC PANEL
Anion gap: 17 — ABNORMAL HIGH (ref 5–15)
BUN: 48 mg/dL — ABNORMAL HIGH (ref 6–23)
CO2: 19 mEq/L (ref 19–32)
Calcium: 9.3 mg/dL (ref 8.4–10.5)
Chloride: 104 mEq/L (ref 96–112)
Creatinine, Ser: 1.85 mg/dL — ABNORMAL HIGH (ref 0.50–1.10)
GFR, EST AFRICAN AMERICAN: 29 mL/min — AB (ref >90)
GFR, EST NON AFRICAN AMERICAN: 25 mL/min — AB (ref >90)
Glucose, Bld: 89 mg/dL (ref 70–99)
POTASSIUM: 4.5 meq/L (ref 3.7–5.3)
Sodium: 140 mEq/L (ref 137–147)

## 2014-04-30 LAB — DIGOXIN LEVEL: Digoxin Level: 0.5 ng/mL — ABNORMAL LOW (ref 0.8–2.0)

## 2014-04-30 MED ORDER — TORSEMIDE 20 MG PO TABS: ORAL_TABLET | ORAL | Status: DC

## 2014-04-30 MED ORDER — ASPIRIN EC 81 MG PO TBEC
81.0000 mg | DELAYED_RELEASE_TABLET | Freq: Every day | ORAL | Status: DC
Start: 2014-04-30 — End: 2014-06-18

## 2014-04-30 NOTE — Patient Instructions (Signed)
Labs today.  CONTINUE Coreg 6.25mg  twice a day DECREASE Aspirin to 81 mg daily DECREASE Torsemide to 20 mg every other day  Your physician recommends that you schedule a follow-up appointment in: 4-6 weeks  Do the following things EVERYDAY: 1) Weigh yourself in the morning before breakfast. Write it down and keep it in a log. 2) Take your medicines as prescribed 3) Eat low salt foods-Limit salt (sodium) to 2000 mg per day.  4) Stay as active as you can everyday 5) Limit all fluids for the day to less than 2 liters 6)

## 2014-04-30 NOTE — Progress Notes (Signed)
Patient ID: Jacqueline Rivas, female   DOB: [birth year], 80 y.o.   MRN: [id number] Oncologist: Dr Marin Olp  HPI: Jacqueline Rivas is a 80 y.o. female with a history of multiple myeloma (diagnosed 11/2012 - treated with XRT/chemo), breast CA, CKD and recent diagnosis of systolic HF, likely due to Herceptin toxicity.   Apparently her EF was normal in September 2014. She was on Herceptin for breast CA with Dr. Marin Olp for 1 year, starting in June 2014. In February, she had an echo which showed an EF of 20-25%. The Herceptin was discontinued. She was admitted last week to [facility] for volume overload. She was diuresed. Discharged to [facility] on 01/22/14. Weight ~127. Discharged on lasix 64m daily. In hospital diuresis was limited by low BP.   She had echo 01/17/14: LVEF 20% RV mildly dilated. Moderate TR.   She was admitted later in in 4/15 from clinic with volume overload.  She was started on milrinone and Lasix gtts with good diuresis.  She was weaned off milrinone.   Today, she says that she is feeling stronger.  Breathing has improved.  She is fatigued with steep stairs but can walk on flat ground without significant dyspnea.  No orthopnea or PND.  No chest pain,  syncope, palpitations.  She is occasionally dizzy but not badly.  Weight is up but per her son she has been eating much better. I reviewed today's echo.  EF 35%, which is improved from prior.  Of note, she has only been taking her Coreg once a day.   Labs (4/15): Na 137, K 4.2 => 3.9, Cr 1.27 => 1.35 Labs (03/16/14): K 4.1 Creatinine 1.7, digoxin 0.8 Labs (6/15): K 4.5, creatinine 1.9, HCT 31.1  Review of Systems: All systems reviewed and negative except as per HPI.   Past Medical History  Diagnosis Date  . Diverticulosis   . Hiatal hernia   . Hyperlipidemia   . Hypertension   . Breast mass   . Multiple myeloma   . Breast cancer   . Arthritis   . S/P radiation therapy 12/15/12 - 12/21/12    T6 - L1 / 18.75 Gy / 5 Fractions  . Bilateral atelectasis    . Systolic heart failure     EF 20% 11/2013  . Protein-calorie malnutrition, severe 01/20/2014  . Chronic kidney disease     stage 3    Current Outpatient Prescriptions  Medication Sig Dispense Refill  . calcium carbonate (OS-CAL) 600 MG TABS tablet Take 1,200 mg by mouth daily with breakfast.      . carvedilol (COREG) 3.125 MG tablet Take 2 tablets (6.25 mg total) by mouth 2 (two) times daily with a meal.  120 tablet  3  . Coenzyme Q10 (CO Q 10) 100 MG CAPS Take 100 mg by mouth every morning.       .Marland Kitchendexamethasone (DECADRON) 4 MG tablet Take 5 tablets (20 mg total) by mouth once a week.  120 tablet  3  . digoxin (LANOXIN) 0.125 MG tablet Take 1 tablet (0.125 mg total) by mouth daily.  30 tablet  3  . fentaNYL (DURAGESIC) 12 MCG/HR Place 1 patch (12.5 mcg total) onto the skin every 3 (three) days.  10 patch  0  . HYDROmorphone (DILAUDID) 2 MG tablet Take 2 mg by mouth every 6 (six) hours as needed (pain).       .Marland Kitchenlansoprazole (PREVACID) 30 MG capsule Take 30 mg by mouth daily at 12 noon. Pt has not been taking daily.      .Marland Kitchen  lenalidomide (REVLIMID) 15 MG capsule Take 1 capsule (15 mg total) by mouth daily. Auth # [id number]  21 capsule  0  . lisinopril (PRINIVIL,ZESTRIL) 2.5 MG tablet Take 1 tablet (2.5 mg total) by mouth daily.  30 tablet  3  . omeprazole (PRILOSEC) 20 MG capsule Take 1 capsule (20 mg total) by mouth every morning.  30 capsule  0  . polyethylene glycol (MIRALAX / GLYCOLAX) packet Take 17 g by mouth daily.      . potassium chloride SA (K-DUR,KLOR-CON) 20 MEQ tablet Take 1 tablet (20 mEq total) by mouth daily.  30 tablet  3  . spironolactone (ALDACTONE) 25 MG tablet Take 1 tablet (25 mg total) by mouth daily.  30 tablet  6  . torsemide (DEMADEX) 20 MG tablet Take one tab (20mg) every other day  30 tablet  3  . aspirin EC 81 MG tablet Take 1 tablet (81 mg total) by mouth daily.  30 tablet  6   No current facility-administered medications for this encounter.    Facility-Administered Medications Ordered in Other Encounters  Medication Dose Route Frequency Provider Last Rate Last Dose  . sodium chloride 0.9 % injection 10 mL  10 mL Intracatheter PRN Peter R Ennever, MD   10 mL at 04/16/14 1332    No Known Allergies  History   Social History  . Marital Status: Widowed    Spouse Name: N/A    Number of Children: N/A  . Years of Education: N/A   Occupational History  . Retired    Social History Main Topics  . Smoking status: Never Smoker   . Smokeless tobacco: Never Used     Comment: never used tobacco  . Alcohol Use: No     Comment: heavy alcohol use until 2013  . Drug Use: No  . Sexual Activity: No   Other Topics Concern  . Not on file   Social History Narrative   Has apartment, lives alone.  Uses a cane around apartment.  Has walker. Drives.      Family History  Problem Relation Age of Onset  . Prostate cancer Father   . Heart attack Brother   . Asthma Maternal Grandmother   . Multiple sclerosis Sister   . Lymphoma Son     large B-cell  . Heart attack Father     PHYSICAL EXAM: Filed Vitals:   04/30/14 1339  BP: 108/52  Pulse: 60  SpO2: 97%    General:  Chronically ill appearing. Frail.  Son present  HEENT: normal Neck: supple. No JVD. Carotids 2+ bilat; no bruits. No lymphadenopathy or thyromegaly appreciated. Cor: PMI laterally displaced. Regular rate & rhythm. No S3. 2/6 TR murmur. Lungs: decreased at bases Abdomen: soft, nontender, non distended. No hepatosplenomegaly. No bruits or masses. Good bowel sounds. Extremities: no cyanosis, clubbing, rash.  No edema.   Neuro: alert & oriented x 3, cranial nerves grossly intact. moves all 4 extremities w/o difficulty. Affect pleasant.  ASSESSMENT & PLAN: 1. Acute on chronic systolic HF:  Suspect nonischemic cardiomyopathy due to Herceptin use. EF has improved to 35% from 20%.  NYHA class II-III symptoms, improved.  She has not had cardiac catheterization but would  defer with CKD and good alternative explanation.  Creatinine has been inching up.  She is not volume overloaded on exam.  - Decrease torsemide to 20 mg every other day.  - Continue digoxin and spironolactone at current doses, will check BMET and digoxin levels today.   - Continue   lisinopril 2.5 qhs, no titration with rise in creatinine.  - Increase Coreg to 6.25 mg bid (now taking once daily). - Given age and improvement in EF, would hold off on ICD.  She is not CRT candidate with narrow QRS.  2. CKD: Creatinine has risen.  As above, will decrease torsemide to every other day.   3. Breast CA: Per Dr Marin Olp, would avoid Herceptin in future.  4. Multiple myeloma: Per Dr. Marin Olp.  5. Can decrease ASA dose to 81 mg daily. 6. DNR/DNI  Follow up in 6 weeks.   Loralie Champagne  04/30/2014

## 2014-04-30 NOTE — Progress Notes (Signed)
  Echocardiogram 2D Echocardiogram has been performed.  Mauricio Po 04/30/2014, 2:17 PM

## 2014-05-10 ENCOUNTER — Other Ambulatory Visit: Payer: Medicare Other | Admitting: Lab

## 2014-05-10 ENCOUNTER — Ambulatory Visit: Payer: Medicare Other

## 2014-05-10 ENCOUNTER — Ambulatory Visit: Payer: Medicare Other | Admitting: Hematology & Oncology

## 2014-05-21 ENCOUNTER — Ambulatory Visit (HOSPITAL_BASED_OUTPATIENT_CLINIC_OR_DEPARTMENT_OTHER): Payer: Medicare Other | Admitting: Lab

## 2014-05-21 ENCOUNTER — Other Ambulatory Visit: Payer: Self-pay | Admitting: *Deleted

## 2014-05-21 ENCOUNTER — Ambulatory Visit (HOSPITAL_BASED_OUTPATIENT_CLINIC_OR_DEPARTMENT_OTHER): Payer: Medicare Other

## 2014-05-21 ENCOUNTER — Ambulatory Visit (HOSPITAL_BASED_OUTPATIENT_CLINIC_OR_DEPARTMENT_OTHER): Payer: Medicare Other | Admitting: Hematology & Oncology

## 2014-05-21 VITALS — BP 108/59 | HR 72 | Temp 98.7°F | Resp 14 | Ht 65.0 in | Wt 132.0 lb

## 2014-05-21 DIAGNOSIS — C9000 Multiple myeloma not having achieved remission: Secondary | ICD-10-CM

## 2014-05-21 DIAGNOSIS — D630 Anemia in neoplastic disease: Secondary | ICD-10-CM

## 2014-05-21 DIAGNOSIS — C50919 Malignant neoplasm of unspecified site of unspecified female breast: Secondary | ICD-10-CM

## 2014-05-21 DIAGNOSIS — C773 Secondary and unspecified malignant neoplasm of axilla and upper limb lymph nodes: Secondary | ICD-10-CM

## 2014-05-21 DIAGNOSIS — C50911 Malignant neoplasm of unspecified site of right female breast: Secondary | ICD-10-CM

## 2014-05-21 LAB — CBC WITH DIFFERENTIAL (CANCER CENTER ONLY)
BASO#: 0.1 10*3/uL (ref 0.0–0.2)
BASO%: 2 % (ref 0.0–2.0)
EOS%: 7.5 % — ABNORMAL HIGH (ref 0.0–7.0)
Eosinophils Absolute: 0.3 10*3/uL (ref 0.0–0.5)
HCT: 29.7 % — ABNORMAL LOW (ref 34.8–46.6)
HGB: 9.6 g/dL — ABNORMAL LOW (ref 11.6–15.9)
LYMPH#: 0.9 10*3/uL (ref 0.9–3.3)
LYMPH%: 20.6 % (ref 14.0–48.0)
MCH: 35.3 pg — ABNORMAL HIGH (ref 26.0–34.0)
MCHC: 32.3 g/dL (ref 32.0–36.0)
MCV: 109 fL — ABNORMAL HIGH (ref 81–101)
MONO#: 0.3 10*3/uL (ref 0.1–0.9)
MONO%: 6.9 % (ref 0.0–13.0)
NEUT%: 63 % (ref 39.6–80.0)
NEUTROS ABS: 2.9 10*3/uL (ref 1.5–6.5)
PLATELETS: 196 10*3/uL (ref 145–400)
RBC: 2.72 10*6/uL — AB (ref 3.70–5.32)
RDW: 16.2 % — AB (ref 11.1–15.7)
WBC: 4.5 10*3/uL (ref 3.9–10.0)

## 2014-05-21 LAB — CMP (CANCER CENTER ONLY)
ALK PHOS: 57 U/L (ref 26–84)
ALT(SGPT): 20 U/L (ref 10–47)
AST: 22 U/L (ref 11–38)
Albumin: 3.2 g/dL — ABNORMAL LOW (ref 3.3–5.5)
BUN: 36 mg/dL — AB (ref 7–22)
CALCIUM: 8.5 mg/dL (ref 8.0–10.3)
CHLORIDE: 93 meq/L — AB (ref 98–108)
CO2: 27 mEq/L (ref 18–33)
CREATININE: 1.8 mg/dL — AB (ref 0.6–1.2)
Glucose, Bld: 91 mg/dL (ref 73–118)
Potassium: 4.7 mEq/L (ref 3.3–4.7)
Sodium: 139 mEq/L (ref 128–145)
Total Bilirubin: 1 mg/dl (ref 0.20–1.60)
Total Protein: 7 g/dL (ref 6.4–8.1)

## 2014-05-21 MED ORDER — DARBEPOETIN ALFA-POLYSORBATE 300 MCG/0.6ML IJ SOLN
300.0000 ug | Freq: Once | INTRAMUSCULAR | Status: AC
  Administered 2014-05-21: 300 ug via SUBCUTANEOUS

## 2014-05-21 MED ORDER — ZOLEDRONIC ACID 4 MG/5ML IV CONC
3.0000 mg | Freq: Once | INTRAVENOUS | Status: AC
  Administered 2014-05-21: 3 mg via INTRAVENOUS
  Filled 2014-05-21: qty 3.75

## 2014-05-21 MED ORDER — SODIUM CHLORIDE 0.9 % IJ SOLN
10.0000 mL | INTRAMUSCULAR | Status: DC | PRN
  Administered 2014-05-21: 10 mL via INTRAVENOUS
  Filled 2014-05-21: qty 10

## 2014-05-21 MED ORDER — HEPARIN SOD (PORK) LOCK FLUSH 100 UNIT/ML IV SOLN
500.0000 [IU] | Freq: Once | INTRAVENOUS | Status: AC
  Administered 2014-05-21: 500 [IU] via INTRAVENOUS
  Filled 2014-05-21: qty 5

## 2014-05-21 MED ORDER — DARBEPOETIN ALFA-POLYSORBATE 300 MCG/0.6ML IJ SOLN: 300.0000 ug | Freq: Once | INTRAMUSCULAR | Status: DC

## 2014-05-21 MED ORDER — CARVEDILOL 6.25 MG PO TABS: 6.2500 mg | ORAL_TABLET | Freq: Two times a day (BID) | ORAL | Status: DC

## 2014-05-21 MED ORDER — SODIUM CHLORIDE 0.9 % IV SOLN
Freq: Once | INTRAVENOUS | Status: AC
  Administered 2014-05-21: 16:00:00 via INTRAVENOUS

## 2014-05-21 MED ORDER — DARBEPOETIN ALFA-POLYSORBATE 300 MCG/0.6ML IJ SOLN
INTRAMUSCULAR | Status: AC
Start: 2014-05-21 — End: 2014-05-21
  Filled 2014-05-21: qty 0.6

## 2014-05-21 NOTE — Patient Instructions (Signed)
Darbepoetin Alfa injection What is this medicine? DARBEPOETIN ALFA (dar be POE e tin AL fa) helps your body make more red blood cells. It is used to treat anemia caused by chronic kidney failure and chemotherapy. This medicine may be used for other purposes; ask your health care provider or pharmacist if you have questions. COMMON BRAND NAME(S): Aranesp What should I tell my health care provider before I take this medicine? They need to know if you have any of these conditions: -blood clotting disorders or history of blood clots -cancer patient not on chemotherapy -cystic fibrosis -heart disease, such as angina, heart failure, or a history of a heart attack -hemoglobin level of 12 g/dL or greater -high blood pressure -low levels of folate, iron, or vitamin B12 -seizures -an unusual or allergic reaction to darbepoetin, erythropoietin, albumin, hamster proteins, latex, other medicines, foods, dyes, or preservatives -pregnant or trying to get pregnant -breast-feeding How should I use this medicine? This medicine is for injection into a vein or under the skin. It is usually given by a health care professional in a hospital or clinic setting. If you get this medicine at home, you will be taught how to prepare and give this medicine. Do not shake the solution before you withdraw a dose. Use exactly as directed. Take your medicine at regular intervals. Do not take your medicine more often than directed. It is important that you put your used needles and syringes in a special sharps container. Do not put them in a trash can. If you do not have a sharps container, call your pharmacist or healthcare provider to get one. Talk to your pediatrician regarding the use of this medicine in children. While this medicine may be used in children as young as 1 year for selected conditions, precautions do apply. Overdosage: If you think you have taken too much of this medicine contact a poison control center or  emergency room at once. NOTE: This medicine is only for you. Do not share this medicine with others. What if I miss a dose? If you miss a dose, take it as soon as you can. If it is almost time for your next dose, take only that dose. Do not take double or extra doses. What may interact with this medicine? Do not take this medicine with any of the following medications: -epoetin alfa This list may not describe all possible interactions. Give your health care provider a list of all the medicines, herbs, non-prescription drugs, or dietary supplements you use. Also tell them if you smoke, drink alcohol, or use illegal drugs. Some items may interact with your medicine. What should I watch for while using this medicine? Visit your prescriber or health care professional for regular checks on your progress and for the needed blood tests and blood pressure measurements. It is especially important for the doctor to make sure your hemoglobin level is in the desired range, to limit the risk of potential side effects and to give you the best benefit. Keep all appointments for any recommended tests. Check your blood pressure as directed. Ask your doctor what your blood pressure should be and when you should contact him or her. As your body makes more red blood cells, you may need to take iron, folic acid, or vitamin B supplements. Ask your doctor or health care provider which products are right for you. If you have kidney disease continue dietary restrictions, even though this medication can make you feel better. Talk with your doctor or health   care professional about the foods you eat and the vitamins that you take. What side effects may I notice from receiving this medicine? Side effects that you should report to your doctor or health care professional as soon as possible: -allergic reactions like skin rash, itching or hives, swelling of the face, lips, or tongue -breathing problems -changes in vision -chest  pain -confusion, trouble speaking or understanding -feeling faint or lightheaded, falls -high blood pressure -muscle aches or pains -pain, swelling, warmth in the leg -rapid weight gain -severe headaches -sudden numbness or weakness of the face, arm or leg -trouble walking, dizziness, loss of balance or coordination -seizures (convulsions) -swelling of the ankles, feet, hands -unusually weak or tired Side effects that usually do not require medical attention (report to your doctor or health care professional if they continue or are bothersome): -diarrhea -fever, chills (flu-like symptoms) -headaches -nausea, vomiting -redness, stinging, or swelling at site where injected This list may not describe all possible side effects. Call your doctor for medical advice about side effects. You may report side effects to FDA at 1-800-FDA-1088. Where should I keep my medicine? Keep out of the reach of children. Store in a refrigerator between 2 and 8 degrees C (36 and 46 degrees F). Do not freeze. Do not shake. Throw away any unused portion if using a single-dose vial. Throw away any unused medicine after the expiration date. NOTE: This sheet is a summary. It may not cover all possible information. If you have questions about this medicine, talk to your doctor, pharmacist, or health care provider.  2014, Elsevier/Gold Standard. (2008-09-18 10:23:57) Zoledronic Acid injection (Hypercalcemia, Oncology) What is this medicine? ZOLEDRONIC ACID (ZOE le dron ik AS id) lowers the amount of calcium loss from bone. It is used to treat too much calcium in your blood from cancer. It is also used to prevent complications of cancer that has spread to the bone. This medicine may be used for other purposes; ask your health care provider or pharmacist if you have questions. COMMON BRAND NAME(S): Zometa What should I tell my health care provider before I take this medicine? They need to know if you have any of  these conditions: -aspirin-sensitive asthma -cancer, especially if you are receiving medicines used to treat cancer -dental disease or wear dentures -infection -kidney disease -receiving corticosteroids like dexamethasone or prednisone -an unusual or allergic reaction to zoledronic acid, other medicines, foods, dyes, or preservatives -pregnant or trying to get pregnant -breast-feeding How should I use this medicine? This medicine is for infusion into a vein. It is given by a health care professional in a hospital or clinic setting. Talk to your pediatrician regarding the use of this medicine in children. Special care may be needed. Overdosage: If you think you have taken too much of this medicine contact a poison control center or emergency room at once. NOTE: This medicine is only for you. Do not share this medicine with others. What if I miss a dose? It is important not to miss your dose. Call your doctor or health care professional if you are unable to keep an appointment. What may interact with this medicine? -certain antibiotics given by injection -NSAIDs, medicines for pain and inflammation, like ibuprofen or naproxen -some diuretics like bumetanide, furosemide -teriparatide -thalidomide This list may not describe all possible interactions. Give your health care provider a list of all the medicines, herbs, non-prescription drugs, or dietary supplements you use. Also tell them if you smoke, drink alcohol, or use illegal  drugs. Some items may interact with your medicine. What should I watch for while using this medicine? Visit your doctor or health care professional for regular checkups. It may be some time before you see the benefit from this medicine. Do not stop taking your medicine unless your doctor tells you to. Your doctor may order blood tests or other tests to see how you are doing. Women should inform their doctor if they wish to become pregnant or think they might be  pregnant. There is a potential for serious side effects to an unborn child. Talk to your health care professional or pharmacist for more information. You should make sure that you get enough calcium and vitamin D while you are taking this medicine. Discuss the foods you eat and the vitamins you take with your health care professional. Some people who take this medicine have severe bone, joint, and/or muscle pain. This medicine may also increase your risk for jaw problems or a broken thigh bone. Tell your doctor right away if you have severe pain in your jaw, bones, joints, or muscles. Tell your doctor if you have any pain that does not go away or that gets worse. Tell your dentist and dental surgeon that you are taking this medicine. You should not have major dental surgery while on this medicine. See your dentist to have a dental exam and fix any dental problems before starting this medicine. Take good care of your teeth while on this medicine. Make sure you see your dentist for regular follow-up appointments. What side effects may I notice from receiving this medicine? Side effects that you should report to your doctor or health care professional as soon as possible: -allergic reactions like skin rash, itching or hives, swelling of the face, lips, or tongue -anxiety, confusion, or depression -breathing problems -changes in vision -eye pain -feeling faint or lightheaded, falls -jaw pain, especially after dental work -mouth sores -muscle cramps, stiffness, or weakness -trouble passing urine or change in the amount of urine Side effects that usually do not require medical attention (report to your doctor or health care professional if they continue or are bothersome): -bone, joint, or muscle pain -constipation -diarrhea -fever -hair loss -irritation at site where injected -loss of appetite -nausea, vomiting -stomach upset -trouble sleeping -trouble swallowing -weak or tired This list may  not describe all possible side effects. Call your doctor for medical advice about side effects. You may report side effects to FDA at 1-800-FDA-1088. Where should I keep my medicine? This drug is given in a hospital or clinic and will not be stored at home. NOTE: This sheet is a summary. It may not cover all possible information. If you have questions about this medicine, talk to your doctor, pharmacist, or health care provider.  2015, Elsevier/Gold Standard. (2013-03-16 13:03:13)

## 2014-05-21 NOTE — Progress Notes (Signed)
Hematology and Oncology Follow Up Visit  Jacqueline Rivas [id number] [birth year] 80 y.o. 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 She she looks quite good. She has a little swelling in the lower legs. She thinks that she's been a little too liberal with her salt.  Her cardiac function is improving. She a recent echocardiogram. This showed an ejection fraction of 35%. This is much better than it was when last checked.  As far as her myelomas concerned, she is responding. Her last M spike was down to 1.34 g/L. Her IgA level was 1620 mg/dL. Her Kappa light chain was down to 19.6 mg/dL.  Her last CA 27.29 in May was 10. She's complaining of more pain in the right axilla. This certainly could be related to nerves, back from surgery. Her surgery was about 15 months ago. With his had her mastectomy. She had 2 positive lymph nodes.  She is eating well. She's not having nausea vomiting. There's been no problems with bowels or bladder.  Overall, her performance status is ECOG 1-2     Medications: Current outpatient prescriptions:aspirin EC 81 MG tablet, Take 1 tablet (81 mg total) by mouth daily., Disp: 30 tablet, Rfl: 6;  calcium carbonate (OS-CAL) 600 MG TABS tablet, Take 1,200 mg by mouth daily with breakfast., Disp: , Rfl: ;  carvedilol (COREG) 6.25 MG tablet, Take 1 tablet (6.25 mg total) by mouth 2 (two) times daily with a meal., Disp: 60 tablet, Rfl: 6 Coenzyme Q10 (CO Q 10) 100 MG CAPS, Take 100 mg by mouth every morning. , Disp: , Rfl: ;  dexamethasone (DECADRON) 4 MG tablet, Take 5 tablets (20 mg  total) by mouth once a week., Disp: 120 tablet, Rfl: 3;  digoxin (LANOXIN) 0.125 MG tablet, Take 1 tablet (0.125 mg total) by mouth daily., Disp: 30 tablet, Rfl: 3;  fentaNYL (DURAGESIC) 12 MCG/HR, Place 1 patch (12.5 mcg total) onto the skin every 3 (three) days., Disp: 10 patch, Rfl: 0 HYDROmorphone (DILAUDID) 2 MG tablet, Take 2 mg by mouth every 6 (six) hours as needed (pain). , Disp: , Rfl: ;  lansoprazole (PREVACID) 30 MG capsule, Take 30 mg by mouth daily at 12 noon. Pt has not been taking daily., Disp: , Rfl: ;  lenalidomide (REVLIMID) 15 MG capsule, Take 1 capsule (15 mg total) by mouth daily. Auth # [id number], Disp: 21 capsule, Rfl: 0 lisinopril (PRINIVIL,ZESTRIL) 2.5 MG tablet, Take 1 tablet (2.5 mg total) by mouth daily., Disp: 30 tablet, Rfl: 3;  omeprazole (PRILOSEC) 20 MG capsule, Take 1 capsule (20 mg total) by mouth every morning., Disp: 30 capsule, Rfl: 0;  polyethylene glycol (MIRALAX / GLYCOLAX) packet, Take 17 g by mouth daily., Disp: , Rfl: ;  potassium chloride SA (K-DUR,KLOR-CON) 20 MEQ tablet, Take 1 tablet (20 mEq total) by mouth daily., Disp: 30 tablet, Rfl: 3 spironolactone (ALDACTONE) 25 MG tablet, Take 1 tablet (25 mg total) by mouth daily., Disp: 30 tablet, Rfl: 6;  torsemide (DEMADEX) 20 MG tablet, Take one tab (20mg )  every other day, Disp: 30 tablet, Rfl: 3 No current facility-administered medications for this visit. Facility-Administered Medications Ordered in Other Visits: sodium chloride 0.9 % injection 10 mL, 10 mL, Intracatheter, PRN, [name], MD, 10 mL at 04/16/14 1332;  sodium chloride 0.9 % injection 10 mL, 10 mL, Intravenous, PRN, [name], MD, 10 mL at 05/21/14 1638  Allergies: No Known Allergies  Past Medical History, Surgical history, Social history, and Family History were reviewed and updated.  Review of Systems: As above  Physical Exam:  height is 5\' 5"  (1.651 m) and weight is 132 lb (59.875 kg). Her oral temperature is 98.7 F (37.1 C).  Her blood pressure is 108/59 and her pulse is 72. Her respiration is 14.   Thin, somewhat elderly appearing white female. Head and neck exam shows no ocular or oral lesions. She has no palpable cervical or supraclavicular lymph nodes. Lungs are clear bilaterally. Cardiac exam regular rate and rhythm with an occasional is to be. She is a 1/6 systolic ejection murmur. Abdomen is soft. Has good bowel sounds. There is no fluid wave. There is no palpable liver or spleen tip. Breast exam shows right chest wall with well-healed mastectomy. No right chest wall nodules are noted. There is no obvious right axillary adenopathy. Left breast is unremarkable. Back exam shows some kyphosis. She has no tenderness over the spine ribs or hips. Extremities shows 1+ edema in her lower legs. She has age-related osteoarthritic changes in her joints. She has decent strength in her extremity is. Neurological exam is nonfocal. Skin exam shows some scattered ecchymoses.  Lab Results  Component Value Date   WBC 4.5 05/21/2014   HGB 9.6* 05/21/2014   HCT 29.7* 05/21/2014   MCV 109* 05/21/2014   PLT 196 05/21/2014     Chemistry      Component Value Date/Time   NA 139 05/21/2014 1350   NA 140 04/30/2014 1431   K 4.7 05/21/2014 1350   K 4.5 04/30/2014 1431   CL 93* 05/21/2014 1350   CL 104 04/30/2014 1431   CO2 27 05/21/2014 1350   CO2 19 04/30/2014 1431   BUN 36* 05/21/2014 1350   BUN 48* 04/30/2014 1431   CREATININE 1.8* 05/21/2014 1350   CREATININE 1.85* 04/30/2014 1431      Component Value Date/Time   CALCIUM 8.5 05/21/2014 1350   CALCIUM 9.3 04/30/2014 1431   CALCIUM 11.1* 11/13/2012 1526   ALKPHOS 57 05/21/2014 1350   ALKPHOS 112 01/29/2014 1700   AST 22 05/21/2014 1350   AST 39* 01/29/2014 1700   ALT 20 05/21/2014 1350   ALT 59* 01/29/2014 1700   BILITOT 1.00 05/21/2014 1350   BILITOT 1.0 01/29/2014 1700         Impression and Plan: Jacqueline Rivas is 80 year old female. She has IgA kappa myeloma. We finally were able to get her on Revlimid.  The Revlimid has been working very well. Her protein levels have come down very nicely.  That her cardiac function is improving. This, I thought, was going to be the determent of her prognosis. Now that is getting better, I think we can focus on the is cancer and see what we can do about prevent this from coming back. We only gave her Herceptin and Perjeta. She just was not good enough shape to take any systemic chemotherapy. The cardiomyopathy certainly could have been from the Herceptin. Again I thought this was unusual but I really cannot find any other etiology.  Her  renal function is not good enough for [place] to do a CT scan. As such, we will see about a PET scan. The problem is that regard she has bone lesions from her myeloma and the PET scan will probably pick these up.  I thought about the possibility of radiation therapy to the right chest wall and axilla. I know that it has been a year or more since her surgery. She had 2 positive lymph nodes. Again, she just was not in good enough shape to be able to take that kind of therapy and we had to do with the myeloma.  I spent about 45 minutes with she and her son. I went over my recommendations.  We will go ahead and give her the Zometa today.  I will get her back in one month.   [name], MD 8/3/20156:41 PM

## 2014-05-22 ENCOUNTER — Telehealth: Payer: Self-pay | Admitting: Hematology & Oncology

## 2014-05-22 LAB — IRON AND TIBC CHCC
%SAT: 43 % (ref 21–57)
Iron: 124 ug/dL (ref 41–142)
TIBC: 285 ug/dL (ref 236–444)
UIBC: 161 ug/dL (ref 120–384)

## 2014-05-22 LAB — IGG, IGA, IGM
IgA: 1300 mg/dL — ABNORMAL HIGH (ref 69–380)
IgG (Immunoglobin G), Serum: 263 mg/dL — ABNORMAL LOW (ref 690–1700)
IgM, Serum: 25 mg/dL — ABNORMAL LOW (ref 52–322)

## 2014-05-22 LAB — LACTATE DEHYDROGENASE: LDH: 111 U/L (ref 94–250)

## 2014-05-22 LAB — FERRITIN CHCC: FERRITIN: 154 ng/mL (ref 9–269)

## 2014-05-22 NOTE — Telephone Encounter (Signed)
Pt aware of 8-10 PET to be NPO 6 hrs and 9-4 MD

## 2014-05-23 LAB — PROTEIN ELECTROPHORESIS, SERUM, WITH REFLEX
ALBUMIN ELP: 55.4 % — AB (ref 55.8–66.1)
Alpha-1-Globulin: 4.5 % (ref 2.9–4.9)
Alpha-2-Globulin: 11 % (ref 7.1–11.8)
Beta 2: 3.3 % (ref 3.2–6.5)
Beta Globulin: 6.6 % (ref 4.7–7.2)
Gamma Globulin: 19.2 % — ABNORMAL HIGH (ref 11.1–18.8)
M-Spike, %: 0.13 g/dL
TOTAL PROTEIN, SERUM ELECTROPHOR: 6.6 g/dL (ref 6.0–8.3)

## 2014-05-23 LAB — IFE INTERPRETATION

## 2014-05-23 LAB — KAPPA/LAMBDA LIGHT CHAINS
Kappa free light chain: 28.2 mg/dL — ABNORMAL HIGH (ref 0.33–1.94)
Kappa:Lambda Ratio: 20.89 — ABNORMAL HIGH (ref 0.26–1.65)
LAMBDA FREE LGHT CHN: 1.35 mg/dL (ref 0.57–2.63)

## 2014-05-24 ENCOUNTER — Telehealth: Payer: Self-pay | Admitting: Nurse Practitioner

## 2014-05-24 NOTE — Telephone Encounter (Addendum)
Message copied by Jimmy Footman on Thu May 24, 2014 12:11 PM ------      Message from: Burney Gauze R      Created: Wed May 23, 2014  6:00 PM       Call her son -  Myeloma level is now down to 1.09 g/L!!!  [place] job!!  Laurey Arrow ------Pt verbalized understanding and appreciation.

## 2014-05-28 ENCOUNTER — Ambulatory Visit (HOSPITAL_COMMUNITY): Admission: RE | Admit: 2014-05-28 | Payer: Medicare Other | Source: Ambulatory Visit

## 2014-05-28 ENCOUNTER — Encounter (HOSPITAL_COMMUNITY)
Admission: RE | Admit: 2014-05-28 | Discharge: 2014-05-28 | Disposition: A | Discharge status: Home / Self Care | Payer: Medicare Other | Source: Ambulatory Visit | Attending: Diagnostic Radiology | Admitting: Diagnostic Radiology

## 2014-05-28 ENCOUNTER — Encounter (HOSPITAL_COMMUNITY): Payer: Self-pay

## 2014-05-28 DIAGNOSIS — C50919 Malignant neoplasm of unspecified site of unspecified female breast: Secondary | ICD-10-CM | POA: Diagnosis not present

## 2014-05-28 DIAGNOSIS — J392 Other diseases of pharynx: Secondary | ICD-10-CM | POA: Insufficient documentation

## 2014-05-28 DIAGNOSIS — C773 Secondary and unspecified malignant neoplasm of axilla and upper limb lymph nodes: Secondary | ICD-10-CM | POA: Diagnosis not present

## 2014-05-28 DIAGNOSIS — C9000 Multiple myeloma not having achieved remission: Secondary | ICD-10-CM

## 2014-05-28 DIAGNOSIS — C50911 Malignant neoplasm of unspecified site of right female breast: Secondary | ICD-10-CM

## 2014-05-28 LAB — GLUCOSE, CAPILLARY: Glucose-Capillary: 96 mg/dL (ref 70–99)

## 2014-05-28 MED ORDER — FLUDEOXYGLUCOSE F - 18 (FDG) INJECTION: 7.6000 | Freq: Once | INTRAVENOUS | Status: AC | PRN

## 2014-05-29 IMAGING — XA IR FLUORO GUIDE CV LINE*L*
1 series · 2 of 2 positions shown · non-contrast
Comparison: none

CLINICAL DATA: Breast cancer.  Multiple myeloma.

[Series 300: line placements · 2 of 2 slices shown]
[im 1/2]
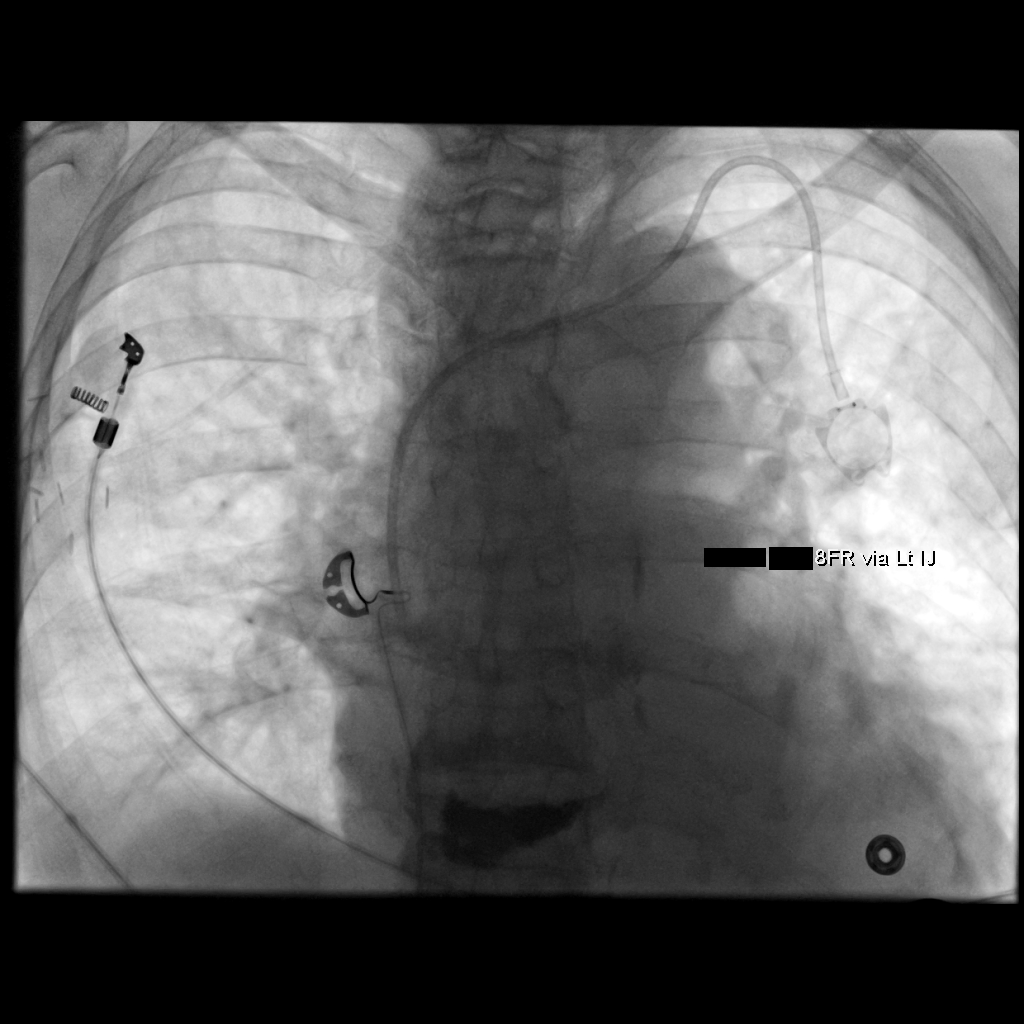
[im 2/2]
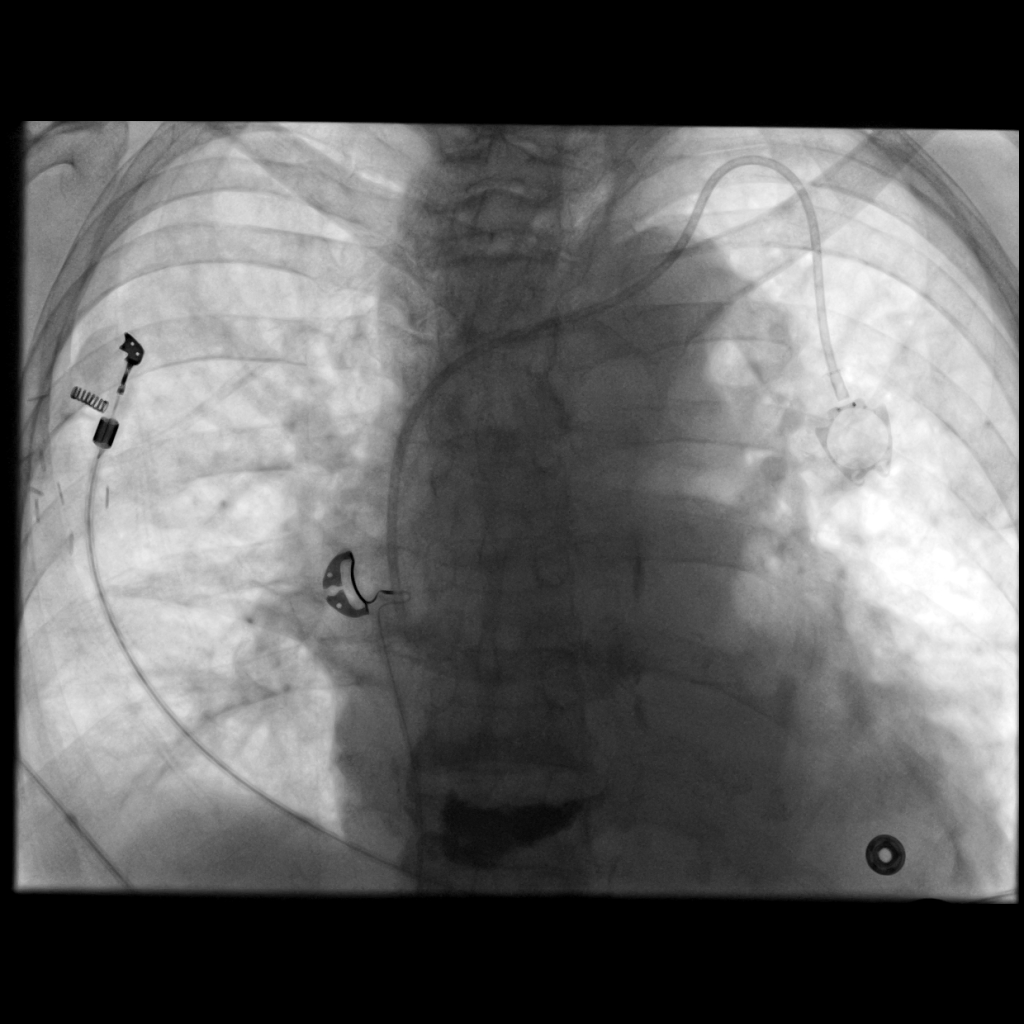

[2 of 2 positions shown; findings below may reference images not displayed]

EXAM:
TUNNEL POWER PORT PLACEMENT WITH SUBCUTANEOUS POCKET UTILIZING
ULTRASOUND & FLOUROSCOPY

FLUOROSCOPY TIME:  1.0 minutes

MEDICATIONS AND MEDICAL HISTORY:
Versed two mg, Fentanyl 100 mcg.

As antibiotic prophylaxis, Ancef was ordered pre-procedure and
administered intravenously within one hour of incision.

ANESTHESIA/SEDATION:
Moderate sedation time: 30 minutes

CONTRAST:  None

PROCEDURE:
After written informed consent was obtained, patient was placed in
the supine position on angiographic table. The left neck and chest
was prepped and draped in a sterile fashion. Lidocaine was utilized
for local anesthesia. The left internal jugular vein was noted to be
patent initially with ultrasound. Under sonographic guidance, a
micropuncture needle was inserted into the left IJ vein (Ultrasound
and fluoroscopic image documentation was performed). The needle was
removed over an 018 wire which was exchanged for a Amplatz. This was
advanced into the IVC. An 8-French dilator was advanced over the
Amplatz.

A small incision was made in the left upper chest over the anterior
left second rib. Utilizing blunt dissection, a subcutaneous pocket
was created in the caudal direction. The pocket was irrigated with a
copious amount of sterile normal saline. The port catheter was
tunneled from the chest incision, and out the neck incision. The
reservoir was inserted into the subcutaneous pocket and secured with
two 3-0 Ethilon stitches. A peel-away sheath was advanced over the
Amplatz wire. The port catheter was cut to measure length and
inserted through the peel-away sheath. The peel-away sheath was
removed. The chest incision was closed with 3-0 Vicryl interrupted
stitches for the subcutaneous tissue and a running of 4-0 Vicryl
subcuticular stitch for the skin. The neck incision was closed with
a 4-0 Vicryl subcuticular stitch. Derma-bond was applied to both
surgical incisions. The port reservoir was flushed and instilled
with heparinized saline. No complications.
FINDINGS: A left IJ vein Port-A-Cath is in place with its tip at the
cavoatrial junction.
IMPRESSION: Successful 8 French left internal jugular vein power port placement
with its tip at the SVC/RA junction.

## 2014-05-30 ENCOUNTER — Telehealth: Payer: Self-pay | Admitting: *Deleted

## 2014-05-30 NOTE — Telephone Encounter (Addendum)
Message copied by Lenn Sink on Wed May 30, 2014  9:06 AM ------      Message from: [name]      Created: Tue May 29, 2014 10:07 PM       Call her son - PET scan is (-) for obvious breast cancer!!!!  No obvious myeloma in the bones!!  pete ------Left voicemail informing pt's son that PET scan is (-) for obvious breast cancer!!!! No obvious myeloma in the bones

## 2014-06-01 ENCOUNTER — Other Ambulatory Visit: Payer: Self-pay

## 2014-06-01 DIAGNOSIS — C9000 Multiple myeloma not having achieved remission: Secondary | ICD-10-CM

## 2014-06-01 MED ORDER — LENALIDOMIDE 15 MG PO CAPS: 15.0000 mg | ORAL_CAPSULE | Freq: Every day | ORAL | Status: DC

## 2014-06-18 ENCOUNTER — Ambulatory Visit (HOSPITAL_COMMUNITY)
Admission: RE | Admit: 2014-06-18 | Discharge: 2014-06-18 | Disposition: A | Discharge status: Home / Self Care | Payer: Medicare Other | Source: Ambulatory Visit | Attending: Internal Medicine | Admitting: Internal Medicine

## 2014-06-18 VITALS — BP 102/64 | HR 77 | Wt 128.5 lb

## 2014-06-18 DIAGNOSIS — C773 Secondary and unspecified malignant neoplasm of axilla and upper limb lymph nodes: Secondary | ICD-10-CM | POA: Diagnosis not present

## 2014-06-18 DIAGNOSIS — N183 Chronic kidney disease, stage 3 unspecified: Secondary | ICD-10-CM | POA: Insufficient documentation

## 2014-06-18 DIAGNOSIS — C50919 Malignant neoplasm of unspecified site of unspecified female breast: Secondary | ICD-10-CM | POA: Insufficient documentation

## 2014-06-18 DIAGNOSIS — I5022 Chronic systolic (congestive) heart failure: Secondary | ICD-10-CM | POA: Insufficient documentation

## 2014-06-18 DIAGNOSIS — Z7982 Long term (current) use of aspirin: Secondary | ICD-10-CM | POA: Diagnosis not present

## 2014-06-18 DIAGNOSIS — C50911 Malignant neoplasm of unspecified site of right female breast: Secondary | ICD-10-CM

## 2014-06-18 DIAGNOSIS — I509 Heart failure, unspecified: Secondary | ICD-10-CM | POA: Diagnosis not present

## 2014-06-18 DIAGNOSIS — C9000 Multiple myeloma not having achieved remission: Secondary | ICD-10-CM

## 2014-06-18 DIAGNOSIS — Z9221 Personal history of antineoplastic chemotherapy: Secondary | ICD-10-CM | POA: Insufficient documentation

## 2014-06-18 MED ORDER — CARVEDILOL 3.125 MG PO TABS: ORAL_TABLET | ORAL | Status: DC

## 2014-06-18 MED ORDER — CARVEDILOL 6.25 MG PO TABS: ORAL_TABLET | ORAL | Status: DC

## 2014-06-18 NOTE — Addendum Note (Signed)
Encounter addended by: Kerry Dory, CMA on: 06/18/2014 11:57 AM<BR>     Documentation filed: Patient Instructions Section, Orders

## 2014-06-18 NOTE — Progress Notes (Signed)
Patient ID: Jacqueline Rivas, female   DOB: [birth year], 80 y.o.   MRN: [id number] Oncologist: Dr Marin Olp  HPI: Jacqueline Rivas is a 80 y.o. female with a history of multiple myeloma (diagnosed 11/2012 - treated with XRT/chemo), breast CA, CKD and recent diagnosis of systolic HF, likely due to Herceptin toxicity.   Apparently her EF was normal in September 2014. She was on Herceptin for breast CA with Dr. Marin Olp for 1 year, starting in June 2014. In February, she had an echo which showed an EF of 20-25%. The Herceptin was discontinued. She was admitted last week to [facility] for volume overload. She was diuresed. Discharged to [facility] on 01/22/14. Weight ~127. Discharged on lasix 83m daily. In hospital diuresis was limited by low BP.   She had echo 01/17/14: LVEF 20% RV mildly dilated. Moderate TR.  Echo 7//15: LVEF 35%   She was admitted later in in 4/15 from clinic with volume overload.  She was started on milrinone and Lasix gtt with good diuresis.  She was weaned off milrinone.   At last visit she saw Dr. MAundra Dubin Torsemide cut back to every other day due to worsening renal function. Cr now stable. Echo improved to 35%.  Weight stable. Appetite improved. No swelling. No orthopnea or PND. Occasional dizziness.   Labs (4/15): Na 137, K 4.2 => 3.9, Cr 1.27 => 1.35 Labs (03/16/14): K 4.1 Creatinine 1.7, digoxin 0.8 Labs (6/15): K 4.5, creatinine 1.9, HCT 31.1 Labs 04/30/14: K 4.5 Cr 1.85 dig 0.5 Labs 05/21/14: K 4.7 Cr 1.8   Review of Systems: All systems reviewed and negative except as per HPI.   Past Medical History  Diagnosis Date  . Diverticulosis   . Hiatal hernia   . Hyperlipidemia   . Hypertension   . Breast mass   . Multiple myeloma   . Breast cancer   . Arthritis   . S/P radiation therapy 12/15/12 - 12/21/12    T6 - L1 / 18.75 Gy / 5 Fractions  . Bilateral atelectasis   . Systolic heart failure     EF 20% 11/2013  . Protein-calorie malnutrition, severe 01/20/2014  . Chronic kidney disease    stage 3    Current Outpatient Prescriptions  Medication Sig Dispense Refill  . aspirin EC 81 MG tablet Take 162 mg by mouth daily.      . calcium carbonate (OS-CAL) 600 MG TABS tablet Take 1,200 mg by mouth daily with breakfast.      . carvedilol (COREG) 6.25 MG tablet Take 1 tablet (6.25 mg total) by mouth 2 (two) times daily with a meal.  60 tablet  6  . Coenzyme Q10 (CO Q 10) 100 MG CAPS Take 100 mg by mouth every morning.       .Marland Kitchendexamethasone (DECADRON) 4 MG tablet Take 5 tablets (20 mg total) by mouth once a week.  120 tablet  3  . digoxin (LANOXIN) 0.125 MG tablet Take 1 tablet (0.125 mg total) by mouth daily.  30 tablet  3  . fentaNYL (DURAGESIC) 12 MCG/HR Place 1 patch (12.5 mcg total) onto the skin every 3 (three) days.  10 patch  0  . HYDROmorphone (DILAUDID) 2 MG tablet Take 2 mg by mouth every 6 (six) hours as needed (pain).       .Marland Kitchenlansoprazole (PREVACID) 30 MG capsule Take 30 mg by mouth daily at 12 noon. Pt has not been taking daily.      .Marland Kitchenlenalidomide (REVLIMID) 15 MG capsule Take 1 capsule (  15 mg total) by mouth daily. Auth # [id number]  21 capsule  0  . lisinopril (PRINIVIL,ZESTRIL) 2.5 MG tablet Take 1 tablet (2.5 mg total) by mouth daily.  30 tablet  3  . omeprazole (PRILOSEC) 20 MG capsule Take 1 capsule (20 mg total) by mouth every morning.  30 capsule  0  . polyethylene glycol (MIRALAX / GLYCOLAX) packet Take 17 g by mouth daily.      . potassium chloride SA (K-DUR,KLOR-CON) 20 MEQ tablet Take 1 tablet (20 mEq total) by mouth daily.  30 tablet  3  . spironolactone (ALDACTONE) 25 MG tablet Take 1 tablet (25 mg total) by mouth daily.  30 tablet  6  . torsemide (DEMADEX) 20 MG tablet Take one tab (56m) every other day  30 tablet  3   No current facility-administered medications for this encounter.   Facility-Administered Medications Ordered in Other Encounters  Medication Dose Route Frequency Provider Last Rate Last Dose  . sodium chloride 0.9 % injection 10 mL  10 mL  Intracatheter PRN PVolanda Napoleon MD   10 mL at 04/16/14 1332    No Known Allergies  History   Social History  . Marital Status: Widowed    Spouse Name: N/A    Number of Children: N/A  . Years of Education: N/A   Occupational History  . Retired    Social History Main Topics  . Smoking status: Never Smoker   . Smokeless tobacco: Never Used     Comment: never used tobacco  . Alcohol Use: No     Comment: heavy alcohol use until 2013  . Drug Use: No  . Sexual Activity: No   Other Topics Concern  . Not on file   Social History Narrative   Has apartment, lives alone.  Uses a cane around apartment.  Has walker. Drives.      Family History  Problem Relation Age of Onset  . Prostate cancer Father   . Heart attack Brother   . Asthma Maternal Grandmother   . Multiple sclerosis Sister   . Lymphoma Son     large B-cell  . Heart attack Father     PHYSICAL EXAM: Filed Vitals:   06/18/14 1042  BP: 102/64  Pulse: 77  Weight: 128 lb 8 oz (58.287 kg)  SpO2: 98%    General:  Elderly. No dyspnea  Son present  HEENT: normal Neck: supple. No JVD. Carotids 2+ bilat; no bruits. No lymphadenopathy or thyromegaly appreciated. Cor: PMI laterally displaced. Regular rate & rhythm. No S3. 2/6 TR murmur. Lungs: decreased at bases Abdomen: soft, nontender, non distended. No hepatosplenomegaly. No bruits or masses. Good bowel sounds. Extremities: no cyanosis, clubbing, rash.  No edema.   Neuro: alert & oriented x 3, cranial nerves grossly intact. moves all 4 extremities w/o difficulty. Affect pleasant.  ASSESSMENT & PLAN: 1. Chronic systolic HF:  Suspect nonischemic cardiomyopathy due to Herceptin use. EF has improved to 35% from 20%.  NYHA class II-III symptoms, improved.  She has not had cardiac catheterization but would defer with CKD and good alternative explanation.   - Doing well. EF improving.  - Continue digoxin and spironolactone at current doses. - Continue lisinopril 2.5  qhs - Increase nighttime coreg 9.375 and keep daytime Coreg at 6.25 mg - Given age and improvement in EF, would hold off on ICD.  She is not CRT candidate with narrow QRS.  - Needs to walk 10-15 mins 2x/day. I gave her a calendar to  record and bring back to me in 6 weeks 2. CKD: Creatinine has stabilized. Will recheck today.  3. Breast CA: Per Dr Marin Olp, would avoid Herceptin in future.  4. Multiple myeloma: Per Dr. Marin Olp. Continue revelemid  5. DNR/DNI  Follow up in 6 weeks.   Glori Bickers MD 06/18/2014

## 2014-06-18 NOTE — Patient Instructions (Signed)
INCREASE Coreg to 6.25 mg in the am and 9.375 mg in the PM  Your physician recommends that you schedule a follow-up appointment in: 6 weeks  Do the following things EVERYDAY: 1) Weigh yourself in the morning before breakfast. Write it down and keep it in a log. 2) Take your medicines as prescribed 3) Eat low salt foods-Limit salt (sodium) to 2000 mg per day.  4) Stay as active as you can everyday 5) Limit all fluids for the day to less than 2 liters 6)

## 2014-06-22 ENCOUNTER — Other Ambulatory Visit: Payer: Self-pay | Admitting: *Deleted

## 2014-06-22 ENCOUNTER — Encounter: Payer: Self-pay | Admitting: Hematology & Oncology

## 2014-06-22 ENCOUNTER — Ambulatory Visit (HOSPITAL_BASED_OUTPATIENT_CLINIC_OR_DEPARTMENT_OTHER): Payer: Medicare Other | Admitting: Hematology & Oncology

## 2014-06-22 ENCOUNTER — Ambulatory Visit (HOSPITAL_BASED_OUTPATIENT_CLINIC_OR_DEPARTMENT_OTHER): Payer: Medicare Other | Admitting: Lab

## 2014-06-22 ENCOUNTER — Ambulatory Visit (HOSPITAL_BASED_OUTPATIENT_CLINIC_OR_DEPARTMENT_OTHER): Payer: Medicare Other

## 2014-06-22 VITALS — BP 130/67 | HR 68 | Temp 98.3°F | Resp 14 | Ht 65.0 in | Wt 131.0 lb

## 2014-06-22 DIAGNOSIS — C9000 Multiple myeloma not having achieved remission: Secondary | ICD-10-CM

## 2014-06-22 DIAGNOSIS — D509 Iron deficiency anemia, unspecified: Secondary | ICD-10-CM

## 2014-06-22 DIAGNOSIS — C773 Secondary and unspecified malignant neoplasm of axilla and upper limb lymph nodes: Secondary | ICD-10-CM

## 2014-06-22 DIAGNOSIS — N183 Chronic kidney disease, stage 3 unspecified: Secondary | ICD-10-CM

## 2014-06-22 DIAGNOSIS — D630 Anemia in neoplastic disease: Secondary | ICD-10-CM

## 2014-06-22 DIAGNOSIS — C50919 Malignant neoplasm of unspecified site of unspecified female breast: Secondary | ICD-10-CM | POA: Diagnosis not present

## 2014-06-22 DIAGNOSIS — I5043 Acute on chronic combined systolic (congestive) and diastolic (congestive) heart failure: Secondary | ICD-10-CM

## 2014-06-22 DIAGNOSIS — C50911 Malignant neoplasm of unspecified site of right female breast: Secondary | ICD-10-CM

## 2014-06-22 LAB — CBC WITH DIFFERENTIAL (CANCER CENTER ONLY)
BASO#: 0 10*3/uL (ref 0.0–0.2)
BASO%: 0.8 % (ref 0.0–2.0)
EOS%: 4.1 % (ref 0.0–7.0)
Eosinophils Absolute: 0.2 10*3/uL (ref 0.0–0.5)
HCT: 30.4 % — ABNORMAL LOW (ref 34.8–46.6)
HGB: 9.7 g/dL — ABNORMAL LOW (ref 11.6–15.9)
LYMPH#: 1.1 10*3/uL (ref 0.9–3.3)
LYMPH%: 21.9 % (ref 14.0–48.0)
MCH: 35.4 pg — AB (ref 26.0–34.0)
MCHC: 31.9 g/dL — AB (ref 32.0–36.0)
MCV: 111 fL — AB (ref 81–101)
MONO#: 0.5 10*3/uL (ref 0.1–0.9)
MONO%: 10 % (ref 0.0–13.0)
NEUT%: 63.2 % (ref 39.6–80.0)
NEUTROS ABS: 3.2 10*3/uL (ref 1.5–6.5)
PLATELETS: 181 10*3/uL (ref 145–400)
RBC: 2.74 10*6/uL — ABNORMAL LOW (ref 3.70–5.32)
RDW: 15.9 % — ABNORMAL HIGH (ref 11.1–15.7)
WBC: 5.1 10*3/uL (ref 3.9–10.0)

## 2014-06-22 LAB — IRON AND TIBC CHCC
%SAT: 29 % (ref 21–57)
IRON: 76 ug/dL (ref 41–142)
TIBC: 263 ug/dL (ref 236–444)
UIBC: 187 ug/dL (ref 120–384)

## 2014-06-22 LAB — FERRITIN CHCC: Ferritin: 144 ng/ml (ref 9–269)

## 2014-06-22 MED ORDER — HEPARIN SOD (PORK) LOCK FLUSH 100 UNIT/ML IV SOLN
500.0000 [IU] | Freq: Once | INTRAVENOUS | Status: AC
  Administered 2014-06-22: 500 [IU] via INTRAVENOUS
  Filled 2014-06-22: qty 5

## 2014-06-22 MED ORDER — SODIUM CHLORIDE 0.9 % IV SOLN
Freq: Once | INTRAVENOUS | Status: AC
  Administered 2014-06-22: 13:00:00 via INTRAVENOUS

## 2014-06-22 MED ORDER — FENTANYL 12 MCG/HR TD PT72: 12.5000 ug | MEDICATED_PATCH | TRANSDERMAL | Status: DC

## 2014-06-22 MED ORDER — DARBEPOETIN ALFA-POLYSORBATE 300 MCG/0.6ML IJ SOLN
INTRAMUSCULAR | Status: AC
  Filled 2014-06-22: qty 0.6

## 2014-06-22 MED ORDER — DARBEPOETIN ALFA-POLYSORBATE 300 MCG/0.6ML IJ SOLN
300.0000 ug | Freq: Once | INTRAMUSCULAR | Status: AC
  Administered 2014-06-22: 300 ug via SUBCUTANEOUS

## 2014-06-22 MED ORDER — SODIUM CHLORIDE 0.9 % IJ SOLN
10.0000 mL | INTRAMUSCULAR | Status: DC | PRN
  Administered 2014-06-22: 10 mL via INTRAVENOUS
  Filled 2014-06-22: qty 10

## 2014-06-22 MED ORDER — SODIUM CHLORIDE 0.9 % IV SOLN
3.0000 mg | Freq: Once | INTRAVENOUS | Status: AC
  Administered 2014-06-22: 3 mg via INTRAVENOUS
  Filled 2014-06-22: qty 3.75

## 2014-06-22 MED ORDER — HEPARIN SOD (PORK) LOCK FLUSH 100 UNIT/ML IV SOLN
250.0000 [IU] | Freq: Once | INTRAVENOUS | Status: DC | PRN
  Filled 2014-06-22: qty 5

## 2014-06-22 NOTE — Patient Instructions (Signed)
Darbepoetin Alfa injection What is this medicine? DARBEPOETIN ALFA (dar be POE e tin AL fa) helps your body make more red blood cells. It is used to treat anemia caused by chronic kidney failure and chemotherapy. This medicine may be used for other purposes; ask your health care provider or pharmacist if you have questions. COMMON BRAND NAME(S): Aranesp What should I tell my health care provider before I take this medicine? They need to know if you have any of these conditions: -blood clotting disorders or history of blood clots -cancer patient not on chemotherapy -cystic fibrosis -heart disease, such as angina, heart failure, or a history of a heart attack -hemoglobin level of 12 g/dL or greater -high blood pressure -low levels of folate, iron, or vitamin B12 -seizures -an unusual or allergic reaction to darbepoetin, erythropoietin, albumin, hamster proteins, latex, other medicines, foods, dyes, or preservatives -pregnant or trying to get pregnant -breast-feeding How should I use this medicine? This medicine is for injection into a vein or under the skin. It is usually given by a health care professional in a hospital or clinic setting. If you get this medicine at home, you will be taught how to prepare and give this medicine. Do not shake the solution before you withdraw a dose. Use exactly as directed. Take your medicine at regular intervals. Do not take your medicine more often than directed. It is important that you put your used needles and syringes in a special sharps container. Do not put them in a trash can. If you do not have a sharps container, call your pharmacist or healthcare provider to get one. Talk to your pediatrician regarding the use of this medicine in children. While this medicine may be used in children as young as 1 year for selected conditions, precautions do apply. Overdosage: If you think you have taken too much of this medicine contact a poison control center or  emergency room at once. NOTE: This medicine is only for you. Do not share this medicine with others. What if I miss a dose? If you miss a dose, take it as soon as you can. If it is almost time for your next dose, take only that dose. Do not take double or extra doses. What may interact with this medicine? Do not take this medicine with any of the following medications: -epoetin alfa This list may not describe all possible interactions. Give your health care provider a list of all the medicines, herbs, non-prescription drugs, or dietary supplements you use. Also tell them if you smoke, drink alcohol, or use illegal drugs. Some items may interact with your medicine. What should I watch for while using this medicine? Visit your prescriber or health care professional for regular checks on your progress and for the needed blood tests and blood pressure measurements. It is especially important for the doctor to make sure your hemoglobin level is in the desired range, to limit the risk of potential side effects and to give you the best benefit. Keep all appointments for any recommended tests. Check your blood pressure as directed. Ask your doctor what your blood pressure should be and when you should contact him or her. As your body makes more red blood cells, you may need to take iron, folic acid, or vitamin B supplements. Ask your doctor or health care provider which products are right for you. If you have kidney disease continue dietary restrictions, even though this medication can make you feel better. Talk with your doctor or health   care professional about the foods you eat and the vitamins that you take. What side effects may I notice from receiving this medicine? Side effects that you should report to your doctor or health care professional as soon as possible: -allergic reactions like skin rash, itching or hives, swelling of the face, lips, or tongue -breathing problems -changes in vision -chest  pain -confusion, trouble speaking or understanding -feeling faint or lightheaded, falls -high blood pressure -muscle aches or pains -pain, swelling, warmth in the leg -rapid weight gain -severe headaches -sudden numbness or weakness of the face, arm or leg -trouble walking, dizziness, loss of balance or coordination -seizures (convulsions) -swelling of the ankles, feet, hands -unusually weak or tired Side effects that usually do not require medical attention (report to your doctor or health care professional if they continue or are bothersome): -diarrhea -fever, chills (flu-like symptoms) -headaches -nausea, vomiting -redness, stinging, or swelling at site where injected This list may not describe all possible side effects. Call your doctor for medical advice about side effects. You may report side effects to FDA at 1-800-FDA-1088. Where should I keep my medicine? Keep out of the reach of children. Store in a refrigerator between 2 and 8 degrees C (36 and 46 degrees F). Do not freeze. Do not shake. Throw away any unused portion if using a single-dose vial. Throw away any unused medicine after the expiration date. NOTE: This sheet is a summary. It may not cover all possible information. If you have questions about this medicine, talk to your doctor, pharmacist, or health care provider.  2015, Elsevier/Gold Standard. (2008-09-18 10:23:57) Zoledronic Acid injection (Hypercalcemia, Oncology) What is this medicine? ZOLEDRONIC ACID (ZOE le dron ik AS id) lowers the amount of calcium loss from bone. It is used to treat too much calcium in your blood from cancer. It is also used to prevent complications of cancer that has spread to the bone. This medicine may be used for other purposes; ask your health care provider or pharmacist if you have questions. COMMON BRAND NAME(S): Zometa What should I tell my health care provider before I take this medicine? They need to know if you have any of  these conditions: -aspirin-sensitive asthma -cancer, especially if you are receiving medicines used to treat cancer -dental disease or wear dentures -infection -kidney disease -receiving corticosteroids like dexamethasone or prednisone -an unusual or allergic reaction to zoledronic acid, other medicines, foods, dyes, or preservatives -pregnant or trying to get pregnant -breast-feeding How should I use this medicine? This medicine is for infusion into a vein. It is given by a health care professional in a hospital or clinic setting. Talk to your pediatrician regarding the use of this medicine in children. Special care may be needed. Overdosage: If you think you have taken too much of this medicine contact a poison control center or emergency room at once. NOTE: This medicine is only for you. Do not share this medicine with others. What if I miss a dose? It is important not to miss your dose. Call your doctor or health care professional if you are unable to keep an appointment. What may interact with this medicine? -certain antibiotics given by injection -NSAIDs, medicines for pain and inflammation, like ibuprofen or naproxen -some diuretics like bumetanide, furosemide -teriparatide -thalidomide This list may not describe all possible interactions. Give your health care provider a list of all the medicines, herbs, non-prescription drugs, or dietary supplements you use. Also tell them if you smoke, drink alcohol, or use illegal  drugs. Some items may interact with your medicine. What should I watch for while using this medicine? Visit your doctor or health care professional for regular checkups. It may be some time before you see the benefit from this medicine. Do not stop taking your medicine unless your doctor tells you to. Your doctor may order blood tests or other tests to see how you are doing. Women should inform their doctor if they wish to become pregnant or think they might be  pregnant. There is a potential for serious side effects to an unborn child. Talk to your health care professional or pharmacist for more information. You should make sure that you get enough calcium and vitamin D while you are taking this medicine. Discuss the foods you eat and the vitamins you take with your health care professional. Some people who take this medicine have severe bone, joint, and/or muscle pain. This medicine may also increase your risk for jaw problems or a broken thigh bone. Tell your doctor right away if you have severe pain in your jaw, bones, joints, or muscles. Tell your doctor if you have any pain that does not go away or that gets worse. Tell your dentist and dental surgeon that you are taking this medicine. You should not have major dental surgery while on this medicine. See your dentist to have a dental exam and fix any dental problems before starting this medicine. Take good care of your teeth while on this medicine. Make sure you see your dentist for regular follow-up appointments. What side effects may I notice from receiving this medicine? Side effects that you should report to your doctor or health care professional as soon as possible: -allergic reactions like skin rash, itching or hives, swelling of the face, lips, or tongue -anxiety, confusion, or depression -breathing problems -changes in vision -eye pain -feeling faint or lightheaded, falls -jaw pain, especially after dental work -mouth sores -muscle cramps, stiffness, or weakness -trouble passing urine or change in the amount of urine Side effects that usually do not require medical attention (report to your doctor or health care professional if they continue or are bothersome): -bone, joint, or muscle pain -constipation -diarrhea -fever -hair loss -irritation at site where injected -loss of appetite -nausea, vomiting -stomach upset -trouble sleeping -trouble swallowing -weak or tired This list may  not describe all possible side effects. Call your doctor for medical advice about side effects. You may report side effects to FDA at 1-800-FDA-1088. Where should I keep my medicine? This drug is given in a hospital or clinic and will not be stored at home. NOTE: This sheet is a summary. It may not cover all possible information. If you have questions about this medicine, talk to your doctor, pharmacist, or health care provider.  2015, Elsevier/Gold Standard. (2013-03-16 13:03:13)

## 2014-06-25 NOTE — Progress Notes (Signed)
Hematology and Oncology Follow Up Visit  Jacqueline Rivas [id number] November 25, 1931 81 y.o. 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 is back for followup. She continues to look better and better.  Jacqueline Rivas myeloma is responding. She is taking the Revlimid as ordered.  When we last saw Jacqueline Rivas, Jacqueline Rivas monoclonal spike was 1.09 g/dL. Jacqueline Rivas IgA level was down to 1300 mg/dL. Jacqueline Rivas kappa light chain was stable at 28.0 mg/dL.  Jacqueline Rivas cardiac function is improving. She is seeing cardiology for this. Jacqueline Rivas last ejection fraction back in July was 35%.  She more active appear appetite is doing better. She's having no problems with pain.  We did do a PET scan on Jacqueline Rivas. This is for Jacqueline Rivas breast cancer. The PET scan was negative for any obvious recurrent disease. This was done in August.  Overall, Jacqueline Rivas performance status is ECOG 2  Medications: Current outpatient prescriptions:aspirin EC 81 MG tablet, Take 162 mg by mouth daily., Disp: , Rfl: ;  calcium carbonate (OS-CAL) 600 MG TABS tablet, Take 1,200 mg by mouth daily with breakfast., Disp: , Rfl: ;  carvedilol (COREG) 3.125 MG tablet, Take 3.125 mg in the PM along with 6.25mg  TOTAL 9.375, Disp: 30 tablet, Rfl: 6 carvedilol (COREG) 6.25 MG tablet, Take 6.25 mg in the am and 6.25 mg with 3.125 mg in the PM for total 9.375, Disp: 60 tablet, Rfl: 6;  Coenzyme Q10 (CO Q 10) 100 MG CAPS, Take 100 mg by mouth every morning. , Disp: , Rfl: ;  dexamethasone (DECADRON) 4 MG tablet, Take 5 tablets (20 mg total) by mouth once a week., Disp: 120 tablet, Rfl: 3;  digoxin (LANOXIN) 0.125 MG tablet, Take 1 tablet (0.125  mg total) by mouth daily., Disp: 30 tablet, Rfl: 3 HYDROmorphone (DILAUDID) 2 MG tablet, Take 2 mg by mouth every 6 (six) hours as needed (pain). , Disp: , Rfl: ;  lansoprazole (PREVACID) 30 MG capsule, Take 30 mg by mouth daily at 12 noon. Pt has not been taking daily., Disp: , Rfl: ;  lenalidomide (REVLIMID) 15 MG capsule, Take 1 capsule (15 mg total) by mouth daily. Auth # [id number], Disp: 21 capsule, Rfl: 0 lisinopril (PRINIVIL,ZESTRIL) 2.5 MG tablet, Take 1 tablet (2.5 mg total) by mouth daily., Disp: 30 tablet, Rfl: 3;  omeprazole (PRILOSEC) 20 MG capsule, Take 1 capsule (20 mg total) by mouth every morning., Disp: 30 capsule, Rfl: 0;  polyethylene glycol (MIRALAX / GLYCOLAX) packet, Take 17 g by mouth daily., Disp: , Rfl: ;  potassium chloride SA (K-DUR,KLOR-CON) 20 MEQ tablet, Take 1 tablet (20 mEq total) by mouth daily., Disp: 30 tablet, Rfl: 3 spironolactone (ALDACTONE) 25 MG tablet, Take 1 tablet (25 mg total) by mouth daily., Disp: 30 tablet, Rfl: 6;  torsemide (DEMADEX) 20 MG tablet, Take one tab (20mg ) every other day, Disp: 30 tablet, Rfl: 3;  fentaNYL (DURAGESIC) 12 MCG/HR, Place 1 patch (12.5 mcg total) onto the skin every 3 (three) days., Disp: 10 patch, Rfl: 0 No current facility-administered medications for this visit. Facility-Administered Medications Ordered in Other  Visits: sodium chloride 0.9 % injection 10 mL, 10 mL, Intracatheter, PRN, [name], MD, 10 mL at 04/16/14 1332  Allergies: No Known Allergies  Past Medical History, Surgical history, Social history, and Family History were reviewed and updated.  Review of Systems: As above  Physical Exam:  height is 5\' 5"  (1.651 m) and weight is 131 lb (59.421 kg). Jacqueline Rivas oral temperature is 98.3 F (36.8 C). Jacqueline Rivas blood pressure is 130/67 and Jacqueline Rivas pulse is 68. Jacqueline Rivas respiration is 14.   Elderly, thin white female in no obvious distress. Head and neck exam shows no ocular or oral lesions. There are no palpable cervical or  supraclavicular lymph nodes. Lungs are clear. Cardiac exam regular in rhythm. She has an occasional is to be. She is a 1/6 systolic ejection murmur. Abdomen soft. She has good bowel sounds. There is no fluid wave. There is no palpable liver or spleen tip. Extremities shows no clubbing, cyanosis or edema. She has some trace edema in Jacqueline Rivas lower legs. She has some age-related osteoarthritic changes. Breast exam shows left breast with no masses, edema or erythema. There is no left axillary adenopathy. Right his pulses well-healed mastectomy. The right chest wall nodules are noted. There is no right axillary adenopathy. Neurological exam shows no focal neurological deficits.  Lab Results  Component Value Date   WBC 5.1 06/22/2014   HGB 9.7* 06/22/2014   HCT 30.4* 06/22/2014   MCV 111* 06/22/2014   PLT 181 06/22/2014     Chemistry      Component Value Date/Time   NA 138 06/22/2014 1035   NA 139 05/21/2014 1350   K 4.6 06/22/2014 1035   K 4.7 05/21/2014 1350   CL 106 06/22/2014 1035   CL 93* 05/21/2014 1350   CO2 23 06/22/2014 1035   CO2 27 05/21/2014 1350   BUN 37* 06/22/2014 1035   BUN 36* 05/21/2014 1350   CREATININE 1.48* 06/22/2014 1035   CREATININE 1.8* 05/21/2014 1350      Component Value Date/Time   CALCIUM 9.0 06/22/2014 1035   CALCIUM 8.5 05/21/2014 1350   CALCIUM 11.1* 11/13/2012 1526   ALKPHOS 78 06/22/2014 1035   ALKPHOS 57 05/21/2014 1350   AST 13 06/22/2014 1035   AST 22 05/21/2014 1350   ALT 16 06/22/2014 1035   ALT 20 05/21/2014 1350   BILITOT 0.4 06/22/2014 1035   BILITOT 1.00 05/21/2014 1350         Impression and Plan: [name] is a 80 year old female. She is IgA kappa myeloma. We finally got Jacqueline Rivas to take Revlimid. She does have classic with Revlimid.  We will see what Jacqueline Rivas monoclonal studies look like.  I did check some iron studies on Jacqueline Rivas. Jacqueline Rivas iron saturation is dropping. I think we'll probably have to give Jacqueline Rivas some IV iron we'll see Jacqueline Rivas back next time. There she'll get Jacqueline Rivas Zometa. Patient will get Aranesp  today.  We will get Jacqueline Rivas back in one month.  We spent about 30 minutes with Jacqueline Rivas today.   [name], MD 9/7/20157:55 AM

## 2014-06-27 LAB — COMPREHENSIVE METABOLIC PANEL
ALBUMIN: 3.4 g/dL — AB (ref 3.5–5.2)
ALT: 16 U/L (ref 0–35)
AST: 13 U/L (ref 0–37)
Alkaline Phosphatase: 78 U/L (ref 39–117)
BILIRUBIN TOTAL: 0.4 mg/dL (ref 0.2–1.2)
BUN: 37 mg/dL — ABNORMAL HIGH (ref 6–23)
CO2: 23 mEq/L (ref 19–32)
Calcium: 9 mg/dL (ref 8.4–10.5)
Chloride: 106 mEq/L (ref 96–112)
Creatinine, Ser: 1.48 mg/dL — ABNORMAL HIGH (ref 0.50–1.10)
Glucose, Bld: 89 mg/dL (ref 70–99)
POTASSIUM: 4.6 meq/L (ref 3.5–5.3)
Sodium: 138 mEq/L (ref 135–145)
Total Protein: 6.1 g/dL (ref 6.0–8.3)

## 2014-06-27 LAB — PROTEIN ELECTROPHORESIS, SERUM, WITH REFLEX
ALPHA-1-GLOBULIN: 5.5 % — AB (ref 2.9–4.9)
Albumin ELP: 52.6 % — ABNORMAL LOW (ref 55.8–66.1)
Alpha-2-Globulin: 13.1 % — ABNORMAL HIGH (ref 7.1–11.8)
BETA 2: 4.1 % (ref 3.2–6.5)
Beta Globulin: 6.7 % (ref 4.7–7.2)
GAMMA GLOBULIN: 18 % (ref 11.1–18.8)
M-SPIKE, %: 0.16 g/dL
Total Protein, Serum Electrophoresis: 6.1 g/dL (ref 6.0–8.3)

## 2014-06-27 LAB — KAPPA/LAMBDA LIGHT CHAINS
KAPPA FREE LGHT CHN: 7.4 mg/dL — AB (ref 0.33–1.94)
KAPPA LAMBDA RATIO: 6.73 — AB (ref 0.26–1.65)
Lambda Free Lght Chn: 1.1 mg/dL (ref 0.57–2.63)

## 2014-06-27 LAB — RETICULOCYTES (CHCC)
ABS RETIC: 28.3 10*3/uL (ref 19.0–186.0)
RBC.: 2.57 MIL/uL — AB (ref 3.87–5.11)
Retic Ct Pct: 1.1 % (ref 0.4–2.3)

## 2014-06-27 LAB — LACTATE DEHYDROGENASE: LDH: 106 U/L (ref 94–250)

## 2014-06-27 LAB — IGG, IGA, IGM
IGA: 1130 mg/dL — AB (ref 69–380)
IgG (Immunoglobin G), Serum: 298 mg/dL — ABNORMAL LOW (ref 690–1700)
IgM, Serum: 27 mg/dL — ABNORMAL LOW (ref 52–322)

## 2014-06-27 LAB — IFE INTERPRETATION

## 2014-07-03 ENCOUNTER — Telehealth: Payer: Self-pay | Admitting: *Deleted

## 2014-07-03 NOTE — Telephone Encounter (Signed)
Message copied by Rico Ala on Tue Jul 03, 2014 10:27 AM ------      Message from: [name]      Created: Tue Jul 03, 2014  7:43 AM       Call her son - myeloma protein is now 0.96!!  This is fantastic!!!  pete ------

## 2014-07-06 NOTE — Progress Notes (Signed)
This encounter was created in error - please disregard.

## 2014-07-09 ENCOUNTER — Other Ambulatory Visit: Payer: Self-pay | Admitting: *Deleted

## 2014-07-09 DIAGNOSIS — C9000 Multiple myeloma not having achieved remission: Secondary | ICD-10-CM

## 2014-07-09 MED ORDER — LENALIDOMIDE 15 MG PO CAPS: 15.0000 mg | ORAL_CAPSULE | Freq: Every day | ORAL | Status: DC

## 2014-07-19 ENCOUNTER — Ambulatory Visit (HOSPITAL_BASED_OUTPATIENT_CLINIC_OR_DEPARTMENT_OTHER): Payer: Medicare Other | Admitting: Hematology & Oncology

## 2014-07-19 ENCOUNTER — Ambulatory Visit (HOSPITAL_BASED_OUTPATIENT_CLINIC_OR_DEPARTMENT_OTHER): Payer: Medicare Other | Admitting: Lab

## 2014-07-19 ENCOUNTER — Encounter: Payer: Self-pay | Admitting: Hematology & Oncology

## 2014-07-19 ENCOUNTER — Ambulatory Visit (HOSPITAL_BASED_OUTPATIENT_CLINIC_OR_DEPARTMENT_OTHER): Payer: Medicare Other

## 2014-07-19 VITALS — BP 108/51 | HR 80 | Temp 97.3°F | Resp 14 | Ht 65.0 in | Wt 134.0 lb

## 2014-07-19 DIAGNOSIS — C50911 Malignant neoplasm of unspecified site of right female breast: Secondary | ICD-10-CM

## 2014-07-19 DIAGNOSIS — D509 Iron deficiency anemia, unspecified: Secondary | ICD-10-CM

## 2014-07-19 DIAGNOSIS — C773 Secondary and unspecified malignant neoplasm of axilla and upper limb lymph nodes: Secondary | ICD-10-CM

## 2014-07-19 DIAGNOSIS — C50811 Malignant neoplasm of overlapping sites of right female breast: Secondary | ICD-10-CM | POA: Diagnosis not present

## 2014-07-19 DIAGNOSIS — C9000 Multiple myeloma not having achieved remission: Secondary | ICD-10-CM

## 2014-07-19 LAB — CBC WITH DIFFERENTIAL (CANCER CENTER ONLY)
BASO#: 0.1 10*3/uL (ref 0.0–0.2)
BASO%: 2.4 % — ABNORMAL HIGH (ref 0.0–2.0)
EOS ABS: 0.3 10*3/uL (ref 0.0–0.5)
EOS%: 6.7 % (ref 0.0–7.0)
HCT: 31.3 % — ABNORMAL LOW (ref 34.8–46.6)
HGB: 10.3 g/dL — ABNORMAL LOW (ref 11.6–15.9)
LYMPH#: 0.9 10*3/uL (ref 0.9–3.3)
LYMPH%: 24.9 % (ref 14.0–48.0)
MCH: 36.1 pg — AB (ref 26.0–34.0)
MCHC: 32.9 g/dL (ref 32.0–36.0)
MCV: 110 fL — ABNORMAL HIGH (ref 81–101)
MONO#: 0.5 10*3/uL (ref 0.1–0.9)
MONO%: 12 % (ref 0.0–13.0)
NEUT%: 54 % (ref 39.6–80.0)
NEUTROS ABS: 2 10*3/uL (ref 1.5–6.5)
PLATELETS: 220 10*3/uL (ref 145–400)
RBC: 2.85 10*6/uL — ABNORMAL LOW (ref 3.70–5.32)
RDW: 15.9 % — ABNORMAL HIGH (ref 11.1–15.7)
WBC: 3.7 10*3/uL — AB (ref 3.9–10.0)

## 2014-07-19 LAB — CMP (CANCER CENTER ONLY)
ALT(SGPT): 20 U/L (ref 10–47)
AST: 22 U/L (ref 11–38)
Albumin: 3.1 g/dL — ABNORMAL LOW (ref 3.3–5.5)
Alkaline Phosphatase: 73 U/L (ref 26–84)
BUN: 42 mg/dL — AB (ref 7–22)
CALCIUM: 9.7 mg/dL (ref 8.0–10.3)
CHLORIDE: 102 meq/L (ref 98–108)
CO2: 26 meq/L (ref 18–33)
CREATININE: 1.5 mg/dL — AB (ref 0.6–1.2)
Glucose, Bld: 107 mg/dL (ref 73–118)
Potassium: 3.7 mEq/L (ref 3.3–4.7)
Sodium: 144 mEq/L (ref 128–145)
Total Bilirubin: 0.6 mg/dl (ref 0.20–1.60)
Total Protein: 7 g/dL (ref 6.4–8.1)

## 2014-07-19 LAB — TECHNOLOGIST REVIEW CHCC SATELLITE

## 2014-07-19 MED ORDER — INFLUENZA VAC SPLIT QUAD 0.5 ML IM SUSY
0.5000 mL | PREFILLED_SYRINGE | INTRAMUSCULAR | Status: DC
  Filled 2014-07-19: qty 0.5

## 2014-07-19 MED ORDER — DARBEPOETIN ALFA-POLYSORBATE 300 MCG/0.6ML IJ SOLN: 300.0000 ug | Freq: Once | INTRAMUSCULAR | Status: DC

## 2014-07-19 MED ORDER — HEPARIN SOD (PORK) LOCK FLUSH 100 UNIT/ML IV SOLN
500.0000 [IU] | Freq: Once | INTRAVENOUS | Status: AC
  Administered 2014-07-19: 500 [IU] via INTRAVENOUS
  Filled 2014-07-19: qty 5

## 2014-07-19 MED ORDER — SODIUM CHLORIDE 0.9 % IJ SOLN
10.0000 mL | INTRAMUSCULAR | Status: DC | PRN
  Administered 2014-07-19: 10 mL via INTRAVENOUS
  Filled 2014-07-19: qty 10

## 2014-07-19 MED ORDER — ZOLEDRONIC ACID 4 MG/5ML IV CONC
3.0000 mg | Freq: Once | INTRAVENOUS | Status: AC
  Administered 2014-07-19: 3 mg via INTRAVENOUS
  Filled 2014-07-19: qty 3.75

## 2014-07-19 NOTE — Patient Instructions (Signed)

## 2014-07-19 NOTE — Progress Notes (Signed)
Hematology and Oncology Follow Up Visit  Jacqueline Rivas [id number] April 11, 1932 80 y.o. 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 is back for followup. She really looks good. She is more active now. Her cardiac function is improving.  Her myeloma is responding nicely. Her monoclonal spike is now 0.96 g/dL.  She's doing well with Revlimid. She very few toxicities from Revlimid.  She's had no problems with cough. She had no increased shortness of breath. There's been no leg swelling.  Route has been good. She's had no nausea vomiting.  She's had no diarrhea.  There's been no rashes.  Medications: Current outpatient prescriptions:aspirin EC 81 MG tablet, Take 162 mg by mouth daily., Disp: , Rfl: ;  carvedilol (COREG) 3.125 MG tablet, Take 3.125 mg in the PM along with 6.25mg  TOTAL 9.375, Disp: 30 tablet, Rfl: 6;  carvedilol (COREG) 6.25 MG tablet, Take 6.25 mg in the am and 6.25 mg with 3.125 mg in the PM for total 9.375, Disp: 60 tablet, Rfl: 6;  Coenzyme Q10 (CO Q 10) 100 MG CAPS, Take 100 mg by mouth every morning. , Disp: , Rfl:  dexamethasone (DECADRON) 4 MG tablet, Take 5 tablets (20 mg total) by mouth once a week., Disp: 120 tablet, Rfl: 3;  digoxin (LANOXIN) 0.125 MG tablet, Take 1 tablet (0.125 mg total) by mouth daily., Disp: 30 tablet, Rfl: 3;  fentaNYL (DURAGESIC) 12 MCG/HR, Place 1 patch (12.5 mcg total) onto the skin every 3 (three) days., Disp: 10 patch, Rfl: 0 HYDROmorphone (DILAUDID) 2 MG tablet, Take 2 mg by mouth every 6 (six) hours as needed (pain). , Disp: , Rfl: ;  lansoprazole (PREVACID) 30 MG  capsule, Take 30 mg by mouth daily at 12 noon. Pt has not been taking daily., Disp: , Rfl: ;  lenalidomide (REVLIMID) 15 MG capsule, Take 1 capsule (15 mg total) by mouth daily. Auth # [id number], Disp: 21 capsule, Rfl: 0 lisinopril (PRINIVIL,ZESTRIL) 2.5 MG tablet, Take 1 tablet (2.5 mg total) by mouth daily., Disp: 30 tablet, Rfl: 3;  omeprazole (PRILOSEC) 20 MG capsule, Take 1 capsule (20 mg total) by mouth every morning., Disp: 30 capsule, Rfl: 0;  polyethylene glycol (MIRALAX / GLYCOLAX) packet, Take 17 g by mouth daily., Disp: , Rfl: ;  potassium chloride SA (K-DUR,KLOR-CON) 20 MEQ tablet, Take 1 tablet (20 mEq total) by mouth daily., Disp: 30 tablet, Rfl: 3 spironolactone (ALDACTONE) 25 MG tablet, Take 1 tablet (25 mg total) by mouth daily., Disp: 30 tablet, Rfl: 6;  torsemide (DEMADEX) 20 MG tablet, Take one tab (20mg ) every other day, Disp: 30 tablet, Rfl: 3;  calcium carbonate (OS-CAL) 600 MG TABS tablet, Take 1,200 mg by mouth daily with breakfast., Disp: , Rfl:  No current facility-administered medications for this visit. Facility-Administered Medications Ordered in Other Visits: [START ON 07/20/2014] Influenza vac split quadrivalent PF (FLUARIX) injection 0.5 mL, 0.5 mL, Intramuscular, Tomorrow-1000, Jacqueline Napoleon, MD;  sodium chloride 0.9 % injection 10 mL, 10 mL, Intracatheter, PRN, Jacqueline Napoleon, MD, 10 mL at 04/16/14 1332;  zolendronic  acid (ZOMETA) 3 mg in sodium chloride 0.9 % 100 mL IVPB, 3 mg, Intravenous, Once, Helayne Seminole, PA-C  Allergies: No Known Allergies  Past Medical History, Surgical history, Social history, and Family History were reviewed and updated.  Review of Systems: As above  Physical Exam:  height is 5\' 5"  (1.651 m) and weight is 134 lb (60.782 kg). Her oral temperature is 97.3 F (36.3 C). Her blood pressure is 108/51 and her pulse is 80. Her respiration is 14.   Elderly, thin white female in no obvious distress. Head and neck exam shows no ocular or oral  lesions. There are no palpable cervical or supraclavicular lymph nodes. Lungs are clear. Cardiac exam regular in rhythm. She has an occasional is to be. She is a 1/6 systolic ejection murmur. Abdomen soft. She has good bowel sounds. There is no fluid wave. There is no palpable liver or spleen tip. Extremities shows no clubbing, cyanosis or edema. She has some trace edema in her lower legs. She has some age-related osteoarthritic changes. Breast exam shows left breast with no masses, edema or erythema. There is no left axillary adenopathy. Right his pulses well-healed mastectomy. The right chest wall nodules are noted. There is no right axillary adenopathy. Neurological exam shows no focal neurological deficits.  Lab Results  Component Value Date   WBC 3.7* 07/19/2014   HGB 10.3* 07/19/2014   HCT 31.3* 07/19/2014   MCV 110* 07/19/2014   PLT 220 07/19/2014     Chemistry      Component Value Date/Time   NA 144 07/19/2014 0905   NA 138 06/22/2014 1035   K 3.7 07/19/2014 0905   K 4.6 06/22/2014 1035   CL 102 07/19/2014 0905   CL 106 06/22/2014 1035   CO2 26 07/19/2014 0905   CO2 23 06/22/2014 1035   BUN 42* 07/19/2014 0905   BUN 37* 06/22/2014 1035   CREATININE 1.5* 07/19/2014 0905   CREATININE 1.48* 06/22/2014 1035      Component Value Date/Time   CALCIUM 9.7 07/19/2014 0905   CALCIUM 9.0 06/22/2014 1035   CALCIUM 11.1* 11/13/2012 1526   ALKPHOS 73 07/19/2014 0905   ALKPHOS 78 06/22/2014 1035   AST 22 07/19/2014 0905   AST 13 06/22/2014 1035   ALT 20 07/19/2014 0905   ALT 16 06/22/2014 1035   BILITOT 0.60 07/19/2014 0905   BILITOT 0.4 06/22/2014 1035         Impression and Plan: Jacqueline Rivas is 80 year old white female. She has both IgA kappa myeloma and stage II breast cancer. He refuses not a problem for [place]. She is off therapy for this.  Am glad that he myeloma is responding. She is doing quite well. Her performance status is ECOG 2.  We will go ahead and give her Zometa today. Her renal function is looking  better.  Her hemoglobin is improving. We do not have to give her any Aranesp today.  I will plan to get her back in another month.  I spent about 30 minutes with she and her son today.     Jacqueline Napoleon, MD 10/1/201510:11 AM

## 2014-07-23 LAB — PROTEIN ELECTROPHORESIS, SERUM, WITH REFLEX
ALPHA-1-GLOBULIN: 10 % — AB (ref 2.9–4.9)
Albumin ELP: 50.5 % — ABNORMAL LOW (ref 55.8–66.1)
Alpha-2-Globulin: 12.5 % — ABNORMAL HIGH (ref 7.1–11.8)
BETA 2: 3.3 % (ref 3.2–6.5)
Beta Globulin: 6.8 % (ref 4.7–7.2)
GAMMA GLOBULIN: 16.9 % (ref 11.1–18.8)
M-Spike, %: 0.62 g/dL
Total Protein, Serum Electrophoresis: 6.4 g/dL (ref 6.0–8.3)

## 2014-07-23 LAB — LACTATE DEHYDROGENASE: LDH: 120 U/L (ref 94–250)

## 2014-07-23 LAB — KAPPA/LAMBDA LIGHT CHAINS
Kappa free light chain: 33.6 mg/dL — ABNORMAL HIGH (ref 0.33–1.94)
Kappa:Lambda Ratio: 24.89 — ABNORMAL HIGH (ref 0.26–1.65)
LAMBDA FREE LGHT CHN: 1.35 mg/dL (ref 0.57–2.63)

## 2014-07-23 LAB — IGG, IGA, IGM
IGA: 988 mg/dL — AB (ref 69–380)
IGM, SERUM: 56 mg/dL (ref 52–322)
IgG (Immunoglobin G), Serum: 378 mg/dL — ABNORMAL LOW (ref 690–1700)

## 2014-07-23 LAB — IFE INTERPRETATION

## 2014-07-24 ENCOUNTER — Telehealth: Payer: Self-pay | Admitting: Nurse Practitioner

## 2014-07-24 NOTE — Telephone Encounter (Addendum)
Message copied by Jimmy Footman on Tue Jul 24, 2014 11:16 AM ------      Message from: [name]      Created: Mon Jul 23, 2014  4:05 PM       Call - protein now down to 0.62!!!  This is getting better.  pete ------LVM on pt and son's personal machine. Informed them to contact office with any further questions or concerns.

## 2014-08-02 ENCOUNTER — Encounter (HOSPITAL_COMMUNITY): Payer: Self-pay

## 2014-08-02 ENCOUNTER — Ambulatory Visit (HOSPITAL_COMMUNITY)
Admission: RE | Admit: 2014-08-02 | Discharge: 2014-08-02 | Disposition: A | Discharge status: Home / Self Care | Payer: Medicare Other | Source: Ambulatory Visit | Attending: Internal Medicine | Admitting: Internal Medicine

## 2014-08-02 VITALS — BP 112/61 | HR 64 | Resp 18 | Wt 135.2 lb

## 2014-08-02 DIAGNOSIS — N183 Chronic kidney disease, stage 3 unspecified: Secondary | ICD-10-CM

## 2014-08-02 DIAGNOSIS — Z66 Do not resuscitate: Secondary | ICD-10-CM | POA: Insufficient documentation

## 2014-08-02 DIAGNOSIS — C50919 Malignant neoplasm of unspecified site of unspecified female breast: Secondary | ICD-10-CM | POA: Diagnosis not present

## 2014-08-02 DIAGNOSIS — C9000 Multiple myeloma not having achieved remission: Secondary | ICD-10-CM | POA: Diagnosis not present

## 2014-08-02 DIAGNOSIS — I5022 Chronic systolic (congestive) heart failure: Secondary | ICD-10-CM | POA: Diagnosis not present

## 2014-08-02 MED ORDER — CARVEDILOL 6.25 MG PO TABS: 6.2500 mg | ORAL_TABLET | Freq: Two times a day (BID) | ORAL | Status: DC

## 2014-08-02 MED ORDER — CARVEDILOL 3.125 MG PO TABS: 3.1250 mg | ORAL_TABLET | Freq: Two times a day (BID) | ORAL | Status: DC

## 2014-08-02 NOTE — Progress Notes (Addendum)
Patient ID: Jacqueline Rivas, female   DOB: November 04, 1931, 80 y.o.   MRN: [id number] Oncologist: Dr Marin Olp  HPI: Jacqueline Rivas is a 80 y.o. female with a history of multiple myeloma (diagnosed 11/2012 - treated with XRT/chemo), breast CA, CKD and recent diagnosis of systolic HF, likely due to Herceptin toxicity.   Apparently her EF was normal in September 2014. She was on Herceptin for breast CA with Dr. Marin Olp for 1 year, starting in June 2014. In February, she had an echo which showed an EF of 20-25%. The Herceptin was discontinued. She was admitted last week to [facility] for volume overload. She was diuresed. Discharged to [facility] on 01/22/14. Weight ~127. Discharged on lasix 38m daily. In hospital diuresis was limited by low BP.   She had echo 01/17/14: LVEF 20% RV mildly dilated. Moderate TR.  Echo 7/15: LVEF 35%   She was admitted later in in 4/15 from clinic with volume overload.  She was started on milrinone and Lasix gtt with good diuresis.  She was weaned off milrinone.   At last visit we increased nighttime coreg 9.375 and kept daytime Coreg at 6.25 mg. Feels better trying to walk 10-15 mins every day. Some days more. Weight up about 5 pounds. Appetite much improved. Breathing better. Taking lasix every other day.   Labs (4/15): Na 137, K 4.2 => 3.9, Cr 1.27 => 1.35 Labs (03/16/14): K 4.1 Creatinine 1.7, digoxin 0.8 Labs (6/15): K 4.5, creatinine 1.9, HCT 31.1 Labs 04/30/14: K 4.5 Cr 1.85 dig 0.5 Labs 05/21/14: K 4.7 Cr 1.8  Labs 07/19/14: K 3.7 Cr 1.5  Review of Systems: All systems reviewed and negative except as per HPI.   Past Medical History  Diagnosis Date  . Diverticulosis   . Hiatal hernia   . Hyperlipidemia   . Hypertension   . Breast mass   . Multiple myeloma   . Breast cancer   . Arthritis   . S/P radiation therapy 12/15/12 - 12/21/12    T6 - L1 / 18.75 Gy / 5 Fractions  . Bilateral atelectasis   . Systolic heart failure     EF 20% 11/2013  . Protein-calorie malnutrition, severe  01/20/2014  . Chronic kidney disease     stage 3    Current Outpatient Prescriptions  Medication Sig Dispense Refill  . aspirin EC 81 MG tablet Take 162 mg by mouth daily.      . calcium carbonate (OS-CAL) 600 MG TABS tablet Take 1,200 mg by mouth daily with breakfast.      . carvedilol (COREG) 3.125 MG tablet Take 3.125 mg in the PM along with 6.269mTOTAL 9.375  30 tablet  6  . Coenzyme Q10 (CO Q 10) 100 MG CAPS Take 100 mg by mouth every morning.       . Marland Kitchenexamethasone (DECADRON) 4 MG tablet Take 5 tablets (20 mg total) by mouth once a week.  120 tablet  3  . digoxin (LANOXIN) 0.125 MG tablet Take 1 tablet (0.125 mg total) by mouth daily.  30 tablet  3  . fentaNYL (DURAGESIC) 12 MCG/HR Place 1 patch (12.5 mcg total) onto the skin every 3 (three) days.  10 patch  0  . HYDROmorphone (DILAUDID) 2 MG tablet Take 2 mg by mouth every 6 (six) hours as needed (pain).       . Marland Kitchenansoprazole (PREVACID) 30 MG capsule Take 30 mg by mouth daily at 12 noon. Pt has not been taking daily.      . Marland Kitchenenalidomide (  REVLIMID) 15 MG capsule Take 1 capsule (15 mg total) by mouth daily. Auth # [id number]  21 capsule  0  . lisinopril (PRINIVIL,ZESTRIL) 2.5 MG tablet Take 1 tablet (2.5 mg total) by mouth daily.  30 tablet  3  . omeprazole (PRILOSEC) 20 MG capsule Take 1 capsule (20 mg total) by mouth every morning.  30 capsule  0  . polyethylene glycol (MIRALAX / GLYCOLAX) packet Take 17 g by mouth daily.      . potassium chloride SA (K-DUR,KLOR-CON) 20 MEQ tablet Take 1 tablet (20 mEq total) by mouth daily.  30 tablet  3  . spironolactone (ALDACTONE) 25 MG tablet Take 1 tablet (25 mg total) by mouth daily.  30 tablet  6  . torsemide (DEMADEX) 20 MG tablet Take one tab (69m) every other day  30 tablet  3   No current facility-administered medications for this encounter.   Facility-Administered Medications Ordered in Other Encounters  Medication Dose Route Frequency Provider Last Rate Last Dose  . sodium chloride 0.9 %  injection 10 mL  10 mL Intracatheter PRN PVolanda Napoleon MD   10 mL at 04/16/14 1332    No Known Allergies  PHYSICAL EXAM: Filed Vitals:   08/02/14 1052  BP: 112/61  Pulse: 64  Resp: 18  Weight: 135 lb 4 oz (61.349 kg)  SpO2: 98%    General:  Elderly. No dyspnea  Son present  HEENT: normal Neck: supple. No JVD. Carotids 2+ bilat; no bruits. No lymphadenopathy or thyromegaly appreciated. Cor: PMI laterally displaced. Regular rate & rhythm. No S3. 2/6 TR murmur. Lungs: clear Abdomen: soft, nontender, non distended. No hepatosplenomegaly. No bruits or masses. Good bowel sounds. Extremities: no cyanosis, clubbing, rash.  No edema.   Neuro: alert & oriented x 3, cranial nerves grossly intact. moves all 4 extremities w/o difficulty. Affect pleasant.  ASSESSMENT & PLAN: 1. Chronic systolic HF:  Suspect nonischemic cardiomyopathy due to Herceptin use. EF has improved to 35% from 20%.  NYHA class II-III symptoms, improved.  She has not had cardiac catheterization but would defer with CKD and good alternative explanation.   - Doing well. EF improving. Volume status looks good. Continue lasix every other day - Continue digoxin and spironolactone at current doses. May be able to stop dig at next visit.  - Continue lisinopril 2.5 qhs - Increase coreg to 9.375 bid - Given age and improvement in EF, would hold off on ICD.  She is not CRT candidate with narrow QRS.  - Continue to walk 10-15 mins 2x/day - Repeat echo in 6 months 2. CKD: Creatinine has stabilized.  3. Breast CA: Per Dr EMarin Olp would avoid Herceptin in future.  4. Multiple myeloma: Per Dr. EMarin Olp Continue revelemid  5. DNR/DNI  Much improved. Continue titrating HF meds. Echo in 6 months. Continue exercise.   DGlori BickersMD 08/02/2014

## 2014-08-02 NOTE — Addendum Note (Signed)
Encounter addended by: Kerry Dory, CMA on: 08/02/2014 11:34 AM<BR>     Documentation filed: Medications, Patient Instructions Section, Orders

## 2014-08-02 NOTE — Addendum Note (Signed)
Encounter addended by: Jolaine Artist, MD on: 08/02/2014 11:28 AM<BR>     Documentation filed: Notes Section

## 2014-08-02 NOTE — Patient Instructions (Signed)
INCREASE Coreg to 9.375 mg twice a day  Your physician recommends that you schedule a follow-up appointment in: 3 months  Do the following things EVERYDAY: 1) Weigh yourself in the morning before breakfast. Write it down and keep it in a log. 2) Take your medicines as prescribed 3) Eat low salt foods-Limit salt (sodium) to 2000 mg per day.  4) Stay as active as you can everyday 5) Limit all fluids for the day to less than 2 liters 6)

## 2014-08-07 ENCOUNTER — Other Ambulatory Visit: Payer: Self-pay | Admitting: *Deleted

## 2014-08-07 DIAGNOSIS — C9000 Multiple myeloma not having achieved remission: Secondary | ICD-10-CM

## 2014-08-07 MED ORDER — LENALIDOMIDE 15 MG PO CAPS: 15.0000 mg | ORAL_CAPSULE | Freq: Every day | ORAL | Status: DC

## 2014-08-20 ENCOUNTER — Encounter: Payer: Self-pay | Admitting: Hematology & Oncology

## 2014-08-20 ENCOUNTER — Ambulatory Visit (HOSPITAL_BASED_OUTPATIENT_CLINIC_OR_DEPARTMENT_OTHER): Payer: Medicare Other | Admitting: Hematology & Oncology

## 2014-08-20 ENCOUNTER — Other Ambulatory Visit: Payer: Self-pay | Admitting: *Deleted

## 2014-08-20 ENCOUNTER — Ambulatory Visit (HOSPITAL_BASED_OUTPATIENT_CLINIC_OR_DEPARTMENT_OTHER): Payer: Medicare Other | Admitting: Lab

## 2014-08-20 ENCOUNTER — Ambulatory Visit (HOSPITAL_BASED_OUTPATIENT_CLINIC_OR_DEPARTMENT_OTHER): Payer: Medicare Other

## 2014-08-20 VITALS — BP 120/55 | HR 75 | Temp 98.2°F | Resp 14 | Ht 65.0 in | Wt 132.0 lb

## 2014-08-20 DIAGNOSIS — C773 Secondary and unspecified malignant neoplasm of axilla and upper limb lymph nodes: Secondary | ICD-10-CM | POA: Diagnosis not present

## 2014-08-20 DIAGNOSIS — C50911 Malignant neoplasm of unspecified site of right female breast: Secondary | ICD-10-CM

## 2014-08-20 DIAGNOSIS — C9000 Multiple myeloma not having achieved remission: Secondary | ICD-10-CM

## 2014-08-20 DIAGNOSIS — D649 Anemia, unspecified: Secondary | ICD-10-CM | POA: Diagnosis not present

## 2014-08-20 DIAGNOSIS — N183 Chronic kidney disease, stage 3 unspecified: Secondary | ICD-10-CM

## 2014-08-20 DIAGNOSIS — D509 Iron deficiency anemia, unspecified: Secondary | ICD-10-CM | POA: Diagnosis not present

## 2014-08-20 DIAGNOSIS — I5043 Acute on chronic combined systolic (congestive) and diastolic (congestive) heart failure: Secondary | ICD-10-CM

## 2014-08-20 LAB — CMP (CANCER CENTER ONLY)
ALK PHOS: 78 U/L (ref 26–84)
ALT(SGPT): 16 U/L (ref 10–47)
AST: 19 U/L (ref 11–38)
Albumin: 3 g/dL — ABNORMAL LOW (ref 3.3–5.5)
BUN: 16 mg/dL (ref 7–22)
CO2: 23 mEq/L (ref 18–33)
CREATININE: 1.3 mg/dL — AB (ref 0.6–1.2)
Calcium: 9 mg/dL (ref 8.0–10.3)
Chloride: 98 mEq/L (ref 98–108)
Glucose, Bld: 111 mg/dL (ref 73–118)
Potassium: 3.8 mEq/L (ref 3.3–4.7)
Sodium: 136 mEq/L (ref 128–145)
Total Bilirubin: 0.6 mg/dl (ref 0.20–1.60)
Total Protein: 6.8 g/dL (ref 6.4–8.1)

## 2014-08-20 LAB — CBC WITH DIFFERENTIAL (CANCER CENTER ONLY)
BASO#: 0.1 10*3/uL (ref 0.0–0.2)
BASO%: 3.2 % — ABNORMAL HIGH (ref 0.0–2.0)
EOS ABS: 0.2 10*3/uL (ref 0.0–0.5)
EOS%: 3.9 % (ref 0.0–7.0)
HCT: 28.9 % — ABNORMAL LOW (ref 34.8–46.6)
HEMOGLOBIN: 9.5 g/dL — AB (ref 11.6–15.9)
LYMPH#: 0.8 10*3/uL — AB (ref 0.9–3.3)
LYMPH%: 17.9 % (ref 14.0–48.0)
MCH: 35.7 pg — ABNORMAL HIGH (ref 26.0–34.0)
MCHC: 32.9 g/dL (ref 32.0–36.0)
MCV: 109 fL — ABNORMAL HIGH (ref 81–101)
MONO#: 1.1 10*3/uL — ABNORMAL HIGH (ref 0.1–0.9)
MONO%: 24.5 % — ABNORMAL HIGH (ref 0.0–13.0)
NEUT%: 50.5 % (ref 39.6–80.0)
NEUTROS ABS: 2.2 10*3/uL (ref 1.5–6.5)
Platelets: 202 10*3/uL (ref 145–400)
RBC: 2.66 10*6/uL — AB (ref 3.70–5.32)
RDW: 14.7 % (ref 11.1–15.7)
WBC: 4.4 10*3/uL (ref 3.9–10.0)

## 2014-08-20 LAB — FERRITIN CHCC: Ferritin: 184 ng/ml (ref 9–269)

## 2014-08-20 LAB — IRON AND TIBC CHCC
%SAT: 23 % (ref 21–57)
Iron: 58 ug/dL (ref 41–142)
TIBC: 248 ug/dL (ref 236–444)
UIBC: 190 ug/dL (ref 120–384)

## 2014-08-20 LAB — CHCC SATELLITE - SMEAR

## 2014-08-20 MED ORDER — SODIUM CHLORIDE 0.9 % IJ SOLN
10.0000 mL | INTRAMUSCULAR | Status: DC | PRN
Start: 2014-08-20 — End: 2014-08-20
  Administered 2014-08-20: 10 mL via INTRAVENOUS
  Filled 2014-08-20: qty 10

## 2014-08-20 MED ORDER — HEPARIN SOD (PORK) LOCK FLUSH 100 UNIT/ML IV SOLN
500.0000 [IU] | Freq: Once | INTRAVENOUS | Status: AC
  Administered 2014-08-20: 500 [IU] via INTRAVENOUS
  Filled 2014-08-20: qty 5

## 2014-08-20 MED ORDER — ZOLEDRONIC ACID 4 MG/5ML IV CONC
3.0000 mg | Freq: Once | INTRAVENOUS | Status: AC
  Administered 2014-08-20: 3 mg via INTRAVENOUS
  Filled 2014-08-20: qty 3.75

## 2014-08-20 MED ORDER — DARBEPOETIN ALFA 300 MCG/0.6ML IJ SOSY
300.0000 ug | PREFILLED_SYRINGE | Freq: Once | INTRAMUSCULAR | Status: AC
  Administered 2014-08-20: 300 ug via SUBCUTANEOUS

## 2014-08-20 MED ORDER — FENTANYL 12 MCG/HR TD PT72: 12.5000 ug | MEDICATED_PATCH | TRANSDERMAL | Status: DC

## 2014-08-20 MED ORDER — DARBEPOETIN ALFA 300 MCG/0.6ML IJ SOSY
PREFILLED_SYRINGE | INTRAMUSCULAR | Status: AC
  Filled 2014-08-20: qty 0.6

## 2014-08-20 NOTE — Progress Notes (Signed)
Hematology and Oncology Follow Up Visit  Jacqueline Rivas [id number] 04-Mar-1932 80 y.o. 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  Jacqueline Rivas is back for followup. She really looks good. She is more active now. Her cardiac function is improving.  Her myeloma is responding nicely. Her monoclonal spike is now 0.62 g/dL. She onlyhas one monoclonal spike now.  She's doing well with Revlimid. She very few toxicities from Revlimid.  She's had no problems with cough. She had no increased shortness of breath. There's been no leg swelling.  Her appetite is okay. She has had no nausea or vomiting  She's had no diarrhea.  There's been no rashes.  Medications: Current outpatient prescriptions: aspirin EC 81 MG tablet, Take 162 mg by mouth daily., Disp: , Rfl: ;  calcium carbonate (OS-CAL) 600 MG TABS tablet, Take 1,200 mg by mouth daily with breakfast., Disp: , Rfl: ;  carvedilol (COREG) 3.125 MG tablet, Take 1 tablet (3.125 mg total) by mouth 2 (two) times daily with a meal. Take along with 6.25mg  (9.375 TOTAL), Disp: 180 tablet, Rfl: 3 carvedilol (COREG) 6.25 MG tablet, Take 1 tablet (6.25 mg total) by mouth 2 (two) times daily with a meal. Take along with 3.125mg  (9.375 TOTAL), Disp: 180 tablet, Rfl: 3;  Coenzyme Q10 (CO Q 10) 100 MG CAPS, Take 100 mg by mouth every morning. , Disp: , Rfl: ;  dexamethasone (DECADRON) 4 MG tablet, Take 5 tablets (20 mg total) by mouth once a week., Disp: 120 tablet, Rfl: 3 digoxin (LANOXIN) 0.125 MG tablet, Take 1 tablet (0.125 mg total) by mouth daily., Disp: 30 tablet, Rfl: 3;  fentaNYL (DURAGESIC) 12  MCG/HR, Place 1 patch (12.5 mcg total) onto the skin every 3 (three) days., Disp: 10 patch, Rfl: 0;  HYDROmorphone (DILAUDID) 2 MG tablet, Take 2 mg by mouth every 6 (six) hours as needed (pain). , Disp: , Rfl:  lansoprazole (PREVACID) 30 MG capsule, Take 30 mg by mouth daily at 12 noon. Pt has not been taking daily., Disp: , Rfl: ;  lenalidomide (REVLIMID) 15 MG capsule, Take 1 capsule (15 mg total) by mouth daily. Auth # [id number], Disp: 21 capsule, Rfl: 0;  lisinopril (PRINIVIL,ZESTRIL) 2.5 MG tablet, Take 1 tablet (2.5 mg total) by mouth daily., Disp: 30 tablet, Rfl: 3 omeprazole (PRILOSEC) 20 MG capsule, Take 1 capsule (20 mg total) by mouth every morning., Disp: 30 capsule, Rfl: 0;  polyethylene glycol (MIRALAX / GLYCOLAX) packet, Take 17 g by mouth daily., Disp: , Rfl: ;  potassium chloride SA (K-DUR,KLOR-CON) 20 MEQ tablet, Take 1 tablet (20 mEq total) by mouth daily., Disp: 30 tablet, Rfl: 3;  spironolactone (ALDACTONE) 25 MG tablet, Take 1 tablet (25 mg total) by mouth daily., Disp: 30 tablet, Rfl: 6 torsemide (DEMADEX) 20 MG tablet, Take one tab (20mg ) every other day, Disp: 30 tablet, Rfl: 3 No current facility-administered medications for this visit. Facility-Administered Medications Ordered in Other Visits: sodium chloride 0.9 % injection 10 mL, 10 mL, Intracatheter, PRN, [name], MD, 10 mL at 04/16/14 1332  Allergies:  No Known Allergies  Past Medical History, Surgical history, Social history, and Family History were reviewed and updated.  Review of Systems: As above  Physical Exam:  height is 5\' 5"  (1.651 m) and weight is 132 lb (59.875 kg). Her oral temperature is 98.2 F (36.8 C). Her blood pressure is 120/55 and her pulse is 75. Her respiration is 14.   Elderly, thin white female in no obvious distress. Head and neck exam shows no ocular or oral lesions. There are no palpable cervical or supraclavicular lymph nodes. Lungs are clear. Cardiac exam regular in rhythm. She has an  occasional is to be. She is a 1/6 systolic ejection murmur. Abdomen soft. She has good bowel sounds. There is no fluid wave. There is no palpable liver or spleen tip. Extremities shows no clubbing, cyanosis or edema. She has some trace edema in her lower legs. She has some age-related osteoarthritic changes. Breast exam shows left breast with no masses, edema or erythema. There is no left axillary adenopathy. Right his pulses well-healed mastectomy. The right chest wall nodules are noted. There is no right axillary adenopathy. Neurological exam shows no focal neurological deficits.  Lab Results  Component Value Date   WBC 4.4 08/20/2014   HGB 9.5* 08/20/2014   HCT 28.9* 08/20/2014   MCV 109* 08/20/2014   PLT 202 08/20/2014     Chemistry      Component Value Date/Time   NA 136 08/20/2014 0904   NA 138 06/22/2014 1035   K 3.8 08/20/2014 0904   K 4.6 06/22/2014 1035   CL 98 08/20/2014 0904   CL 106 06/22/2014 1035   CO2 23 08/20/2014 0904   CO2 23 06/22/2014 1035   BUN 16 08/20/2014 0904   BUN 37* 06/22/2014 1035   CREATININE 1.3* 08/20/2014 0904   CREATININE 1.48* 06/22/2014 1035      Component Value Date/Time   CALCIUM 9.0 08/20/2014 0904   CALCIUM 9.0 06/22/2014 1035   CALCIUM 11.1* 11/13/2012 1526   ALKPHOS 78 08/20/2014 0904   ALKPHOS 78 06/22/2014 1035   AST 19 08/20/2014 0904   AST 13 06/22/2014 1035   ALT 16 08/20/2014 0904   ALT 16 06/22/2014 1035   BILITOT 0.60 08/20/2014 0904   BILITOT 0.4 06/22/2014 1035         Impression and Plan: [name] is 80 year old white female. She has both IgA kappa myeloma and stage II breast cancer. She has her 82th birthday in a couple weeks. I'm sure she will enjoy this.  I am glad that he myeloma is responding. She is doing quite well. Her performance status is ECOG 2.  We will go ahead and give her Zometa today. Her renal function is looking better.  Her hemoglobin is about the same. I will give her a dose of Aranesp  today. I will plan to get her back in another month.  I spent about 30 minutes with her today. She came by her self. This usually is a good sign.   [name], MD 11/2/20156:09 PM

## 2014-08-20 NOTE — Patient Instructions (Addendum)
Zoledronic Acid injection (Hypercalcemia, Oncology) What is this medicine? ZOLEDRONIC ACID (ZOE le dron ik AS id) lowers the amount of calcium loss from bone. It is used to treat too much calcium in your blood from cancer. It is also used to prevent complications of cancer that has spread to the bone. This medicine may be used for other purposes; ask your health care provider or pharmacist if you have questions. COMMON BRAND NAME(S): Zometa What should I tell my health care provider before I take this medicine? They need to know if you have any of these conditions: -aspirin-sensitive asthma -cancer, especially if you are receiving medicines used to treat cancer -dental disease or wear dentures -infection -kidney disease -receiving corticosteroids like dexamethasone or prednisone -an unusual or allergic reaction to zoledronic acid, other medicines, foods, dyes, or preservatives -pregnant or trying to get pregnant -breast-feeding How should I use this medicine? This medicine is for infusion into a vein. It is given by a health care professional in a hospital or clinic setting. Talk to your pediatrician regarding the use of this medicine in children. Special care may be needed. Overdosage: If you think you have taken too much of this medicine contact a poison control center or emergency room at once. NOTE: This medicine is only for you. Do not share this medicine with others. What if I miss a dose? It is important not to miss your dose. Call your doctor or health care professional if you are unable to keep an appointment. What may interact with this medicine? -certain antibiotics given by injection -NSAIDs, medicines for pain and inflammation, like ibuprofen or naproxen -some diuretics like bumetanide, furosemide -teriparatide -thalidomide This list may not describe all possible interactions. Give your health care provider a list of all the medicines, herbs, non-prescription drugs, or  dietary supplements you use. Also tell them if you smoke, drink alcohol, or use illegal drugs. Some items may interact with your medicine. What should I watch for while using this medicine? Visit your doctor or health care professional for regular checkups. It may be some time before you see the benefit from this medicine. Do not stop taking your medicine unless your doctor tells you to. Your doctor may order blood tests or other tests to see how you are doing. Women should inform their doctor if they wish to become pregnant or think they might be pregnant. There is a potential for serious side effects to an unborn child. Talk to your health care professional or pharmacist for more information. You should make sure that you get enough calcium and vitamin D while you are taking this medicine. Discuss the foods you eat and the vitamins you take with your health care professional. Some people who take this medicine have severe bone, joint, and/or muscle pain. This medicine may also increase your risk for jaw problems or a broken thigh bone. Tell your doctor right away if you have severe pain in your jaw, bones, joints, or muscles. Tell your doctor if you have any pain that does not go away or that gets worse. Tell your dentist and dental surgeon that you are taking this medicine. You should not have major dental surgery while on this medicine. See your dentist to have a dental exam and fix any dental problems before starting this medicine. Take good care of your teeth while on this medicine. Make sure you see your dentist for regular follow-up appointments. What side effects may I notice from receiving this medicine? Side effects that   you should report to your doctor or health care professional as soon as possible: -allergic reactions like skin rash, itching or hives, swelling of the face, lips, or tongue -anxiety, confusion, or depression -breathing problems -changes in vision -eye pain -feeling faint or  lightheaded, falls -jaw pain, especially after dental work -mouth sores -muscle cramps, stiffness, or weakness -trouble passing urine or change in the amount of urine Side effects that usually do not require medical attention (report to your doctor or health care professional if they continue or are bothersome): -bone, joint, or muscle pain -constipation -diarrhea -fever -hair loss -irritation at site where injected -loss of appetite -nausea, vomiting -stomach upset -trouble sleeping -trouble swallowing -weak or tired This list may not describe all possible side effects. Call your doctor for medical advice about side effects. You may report side effects to FDA at 1-800-FDA-1088. Where should I keep my medicine? This drug is given in a hospital or clinic and will not be stored at home. NOTE: This sheet is a summary. It may not cover all possible information. If you have questions about this medicine, talk to your doctor, pharmacist, or health care provider.  2015, [employer]. (2013-03-16 13:03:13) Darbepoetin Alfa injection What is this medicine? DARBEPOETIN ALFA (dar be POE e tin AL fa) helps your body make more red blood cells. It is used to treat anemia caused by chronic kidney failure and chemotherapy. This medicine may be used for other purposes; ask your health care provider or pharmacist if you have questions. COMMON BRAND NAME(S): Aranesp What should I tell my health care provider before I take this medicine? They need to know if you have any of these conditions: -blood clotting disorders or history of blood clots -cancer patient not on chemotherapy -cystic fibrosis -heart disease, such as angina, heart failure, or a history of a heart attack -hemoglobin level of 12 g/dL or greater -high blood pressure -low levels of folate, iron, or vitamin B12 -seizures -an unusual or allergic reaction to darbepoetin, erythropoietin, albumin, hamster proteins, latex, other  medicines, foods, dyes, or preservatives -pregnant or trying to get pregnant -breast-feeding How should I use this medicine? This medicine is for injection into a vein or under the skin. It is usually given by a health care professional in a hospital or clinic setting. If you get this medicine at home, you will be taught how to prepare and give this medicine. Do not shake the solution before you withdraw a dose. Use exactly as directed. Take your medicine at regular intervals. Do not take your medicine more often than directed. It is important that you put your used needles and syringes in a special sharps container. Do not put them in a trash can. If you do not have a sharps container, call your pharmacist or healthcare provider to get one. Talk to your pediatrician regarding the use of this medicine in children. While this medicine may be used in children as young as 1 year for selected conditions, precautions do apply. Overdosage: If you think you have taken too much of this medicine contact a poison control center or emergency room at once. NOTE: This medicine is only for you. Do not share this medicine with others. What if I miss a dose? If you miss a dose, take it as soon as you can. If it is almost time for your next dose, take only that dose. Do not take double or extra doses. What may interact with this medicine? Do not take this medicine  with any of the following medications: -epoetin alfa This list may not describe all possible interactions. Give your health care provider a list of all the medicines, herbs, non-prescription drugs, or dietary supplements you use. Also tell them if you smoke, drink alcohol, or use illegal drugs. Some items may interact with your medicine. What should I watch for while using this medicine? Visit your prescriber or health care professional for regular checks on your progress and for the needed blood tests and blood pressure measurements. It is especially  important for the doctor to make sure your hemoglobin level is in the desired range, to limit the risk of potential side effects and to give you the best benefit. Keep all appointments for any recommended tests. Check your blood pressure as directed. Ask your doctor what your blood pressure should be and when you should contact him or her. As your body makes more red blood cells, you may need to take iron, folic acid, or vitamin B supplements. Ask your doctor or health care provider which products are right for you. If you have kidney disease continue dietary restrictions, even though this medication can make you feel better. Talk with your doctor or health care professional about the foods you eat and the vitamins that you take. What side effects may I notice from receiving this medicine? Side effects that you should report to your doctor or health care professional as soon as possible: -allergic reactions like skin rash, itching or hives, swelling of the face, lips, or tongue -breathing problems -changes in vision -chest pain -confusion, trouble speaking or understanding -feeling faint or lightheaded, falls -high blood pressure -muscle aches or pains -pain, swelling, warmth in the leg -rapid weight gain -severe headaches -sudden numbness or weakness of the face, arm or leg -trouble walking, dizziness, loss of balance or coordination -seizures (convulsions) -swelling of the ankles, feet, hands -unusually weak or tired Side effects that usually do not require medical attention (report to your doctor or health care professional if they continue or are bothersome): -diarrhea -fever, chills (flu-like symptoms) -headaches -nausea, vomiting -redness, stinging, or swelling at site where injected This list may not describe all possible side effects. Call your doctor for medical advice about side effects. You may report side effects to FDA at 1-800-FDA-1088. Where should I keep my medicine? Keep  out of the reach of children. Store in a refrigerator between 2 and 8 degrees C (36 and 46 degrees F). Do not freeze. Do not shake. Throw away any unused portion if using a single-dose vial. Throw away any unused medicine after the expiration date. NOTE: This sheet is a summary. It may not cover all possible information. If you have questions about this medicine, talk to your doctor, pharmacist, or health care provider.  2015, [employer]. (2008-09-18 10:23:57)

## 2014-08-22 LAB — IFE INTERPRETATION

## 2014-08-22 LAB — IGG, IGA, IGM
IGA: 913 mg/dL — AB (ref 69–380)
IGM, SERUM: 50 mg/dL — AB (ref 52–322)
IgG (Immunoglobin G), Serum: 422 mg/dL — ABNORMAL LOW (ref 690–1700)

## 2014-08-22 LAB — RETICULOCYTES (CHCC)
ABS Retic: 41.4 10*3/uL (ref 19.0–186.0)
RBC.: 2.76 MIL/uL — AB (ref 3.87–5.11)
RETIC CT PCT: 1.5 % (ref 0.4–2.3)

## 2014-08-22 LAB — PROTEIN ELECTROPHORESIS, SERUM, WITH REFLEX
Albumin ELP: 49.8 % — ABNORMAL LOW (ref 55.8–66.1)
Alpha-1-Globulin: 6.8 % — ABNORMAL HIGH (ref 2.9–4.9)
Alpha-2-Globulin: 14.8 % — ABNORMAL HIGH (ref 7.1–11.8)
BETA 2: 4.8 % (ref 3.2–6.5)
Beta Globulin: 6.6 % (ref 4.7–7.2)
GAMMA GLOBULIN: 17.2 % (ref 11.1–18.8)
M-Spike, %: 0.6 g/dL
Total Protein, Serum Electrophoresis: 6.5 g/dL (ref 6.0–8.3)

## 2014-08-22 LAB — KAPPA/LAMBDA LIGHT CHAINS
Kappa free light chain: 7.59 mg/dL — ABNORMAL HIGH (ref 0.33–1.94)
Kappa:Lambda Ratio: 4.46 — ABNORMAL HIGH (ref 0.26–1.65)
LAMBDA FREE LGHT CHN: 1.7 mg/dL (ref 0.57–2.63)

## 2014-09-04 ENCOUNTER — Other Ambulatory Visit: Payer: Self-pay | Admitting: *Deleted

## 2014-09-04 DIAGNOSIS — C9000 Multiple myeloma not having achieved remission: Secondary | ICD-10-CM

## 2014-09-04 MED ORDER — LENALIDOMIDE 15 MG PO CAPS: 15.0000 mg | ORAL_CAPSULE | Freq: Every day | ORAL | Status: DC

## 2014-09-17 ENCOUNTER — Ambulatory Visit (HOSPITAL_BASED_OUTPATIENT_CLINIC_OR_DEPARTMENT_OTHER): Payer: Medicare Other | Admitting: Lab

## 2014-09-17 ENCOUNTER — Ambulatory Visit (HOSPITAL_BASED_OUTPATIENT_CLINIC_OR_DEPARTMENT_OTHER): Payer: Medicare Other | Admitting: Hematology & Oncology

## 2014-09-17 ENCOUNTER — Other Ambulatory Visit: Payer: Self-pay | Admitting: Hematology & Oncology

## 2014-09-17 ENCOUNTER — Other Ambulatory Visit: Payer: Self-pay | Admitting: Nurse Practitioner

## 2014-09-17 ENCOUNTER — Encounter: Payer: Self-pay | Admitting: Hematology & Oncology

## 2014-09-17 ENCOUNTER — Ambulatory Visit (HOSPITAL_BASED_OUTPATIENT_CLINIC_OR_DEPARTMENT_OTHER): Payer: Medicare Other

## 2014-09-17 VITALS — BP 137/67 | HR 69 | Temp 98.2°F | Resp 16 | Ht 65.0 in | Wt 135.0 lb

## 2014-09-17 DIAGNOSIS — D630 Anemia in neoplastic disease: Secondary | ICD-10-CM

## 2014-09-17 DIAGNOSIS — C50911 Malignant neoplasm of unspecified site of right female breast: Secondary | ICD-10-CM

## 2014-09-17 DIAGNOSIS — N183 Chronic kidney disease, stage 3 unspecified: Secondary | ICD-10-CM

## 2014-09-17 DIAGNOSIS — C9000 Multiple myeloma not having achieved remission: Secondary | ICD-10-CM

## 2014-09-17 DIAGNOSIS — I5043 Acute on chronic combined systolic (congestive) and diastolic (congestive) heart failure: Secondary | ICD-10-CM

## 2014-09-17 LAB — CMP (CANCER CENTER ONLY)
ALK PHOS: 74 U/L (ref 26–84)
ALT(SGPT): 20 U/L (ref 10–47)
AST: 22 U/L (ref 11–38)
Albumin: 3 g/dL — ABNORMAL LOW (ref 3.3–5.5)
BILIRUBIN TOTAL: 0.7 mg/dL (ref 0.20–1.60)
BUN: 20 mg/dL (ref 7–22)
CO2: 27 meq/L (ref 18–33)
CREATININE: 1.5 mg/dL — AB (ref 0.6–1.2)
Calcium: 9.3 mg/dL (ref 8.0–10.3)
Chloride: 107 mEq/L (ref 98–108)
GLUCOSE: 106 mg/dL (ref 73–118)
Potassium: 4.2 mEq/L (ref 3.3–4.7)
SODIUM: 147 meq/L — AB (ref 128–145)
TOTAL PROTEIN: 7.1 g/dL (ref 6.4–8.1)

## 2014-09-17 LAB — FERRITIN CHCC: Ferritin: 151 ng/ml (ref 9–269)

## 2014-09-17 LAB — CBC WITH DIFFERENTIAL (CANCER CENTER ONLY)
BASO#: 0.1 10*3/uL (ref 0.0–0.2)
BASO%: 1.6 % (ref 0.0–2.0)
EOS%: 2.6 % (ref 0.0–7.0)
Eosinophils Absolute: 0.1 10*3/uL (ref 0.0–0.5)
HCT: 29.3 % — ABNORMAL LOW (ref 34.8–46.6)
HEMOGLOBIN: 9.3 g/dL — AB (ref 11.6–15.9)
LYMPH#: 0.9 10*3/uL (ref 0.9–3.3)
LYMPH%: 21.2 % (ref 14.0–48.0)
MCH: 35.4 pg — ABNORMAL HIGH (ref 26.0–34.0)
MCHC: 31.7 g/dL — AB (ref 32.0–36.0)
MCV: 111 fL — AB (ref 81–101)
MONO#: 0.6 10*3/uL (ref 0.1–0.9)
MONO%: 14.9 % — ABNORMAL HIGH (ref 0.0–13.0)
NEUT#: 2.6 10*3/uL (ref 1.5–6.5)
NEUT%: 59.7 % (ref 39.6–80.0)
Platelets: 249 10*3/uL (ref 145–400)
RBC: 2.63 10*6/uL — ABNORMAL LOW (ref 3.70–5.32)
RDW: 15.6 % (ref 11.1–15.7)
WBC: 4.3 10*3/uL (ref 3.9–10.0)

## 2014-09-17 LAB — IRON AND TIBC CHCC
%SAT: 25 % (ref 21–57)
IRON: 65 ug/dL (ref 41–142)
TIBC: 259 ug/dL (ref 236–444)
UIBC: 194 ug/dL (ref 120–384)

## 2014-09-17 MED ORDER — SODIUM CHLORIDE 0.9 % IV SOLN
INTRAVENOUS | Status: DC
  Administered 2014-09-17: 11:00:00 via INTRAVENOUS

## 2014-09-17 MED ORDER — DARBEPOETIN ALFA 300 MCG/0.6ML IJ SOSY
PREFILLED_SYRINGE | INTRAMUSCULAR | Status: AC
  Filled 2014-09-17: qty 0.6

## 2014-09-17 MED ORDER — SODIUM CHLORIDE 0.9 % IJ SOLN
10.0000 mL | INTRAMUSCULAR | Status: AC | PRN
  Administered 2014-09-17: 10 mL
  Filled 2014-09-17: qty 10

## 2014-09-17 MED ORDER — HEPARIN SOD (PORK) LOCK FLUSH 100 UNIT/ML IV SOLN
500.0000 [IU] | INTRAVENOUS | Status: AC | PRN
  Administered 2014-09-17: 500 [IU]
  Filled 2014-09-17: qty 5

## 2014-09-17 MED ORDER — ZOLEDRONIC ACID 4 MG/5ML IV CONC
3.0000 mg | Freq: Once | INTRAVENOUS | Status: AC
  Administered 2014-09-17: 3 mg via INTRAVENOUS
  Filled 2014-09-17: qty 3.75

## 2014-09-17 MED ORDER — SODIUM CHLORIDE 0.9 % IV SOLN
1020.0000 mg | Freq: Once | INTRAVENOUS | Status: AC
  Administered 2014-09-17: 1020 mg via INTRAVENOUS
  Filled 2014-09-17: qty 34

## 2014-09-17 MED ORDER — OMEPRAZOLE 20 MG PO CPDR: 20.0000 mg | DELAYED_RELEASE_CAPSULE | Freq: Every morning | ORAL | Status: DC

## 2014-09-17 MED ORDER — DARBEPOETIN ALFA 300 MCG/0.6ML IJ SOSY
300.0000 ug | PREFILLED_SYRINGE | Freq: Once | INTRAMUSCULAR | Status: AC
  Administered 2014-09-17: 300 ug via SUBCUTANEOUS

## 2014-09-17 NOTE — Patient Instructions (Signed)
Darbepoetin Alfa injection What is this medicine? DARBEPOETIN ALFA (dar be POE e tin AL fa) helps your body make more red blood cells. It is used to treat anemia caused by chronic kidney failure and chemotherapy. This medicine may be used for other purposes; ask your health care provider or pharmacist if you have questions. COMMON BRAND NAME(S): Aranesp What should I tell my health care provider before I take this medicine? They need to know if you have any of these conditions: -blood clotting disorders or history of blood clots -cancer patient not on chemotherapy -cystic fibrosis -heart disease, such as angina, heart failure, or a history of a heart attack -hemoglobin level of 12 g/dL or greater -high blood pressure -low levels of folate, iron, or vitamin B12 -seizures -an unusual or allergic reaction to darbepoetin, erythropoietin, albumin, hamster proteins, latex, other medicines, foods, dyes, or preservatives -pregnant or trying to get pregnant -breast-feeding How should I use this medicine? This medicine is for injection into a vein or under the skin. It is usually given by a health care professional in a hospital or clinic setting. If you get this medicine at home, you will be taught how to prepare and give this medicine. Do not shake the solution before you withdraw a dose. Use exactly as directed. Take your medicine at regular intervals. Do not take your medicine more often than directed. It is important that you put your used needles and syringes in a special sharps container. Do not put them in a trash can. If you do not have a sharps container, call your pharmacist or healthcare provider to get one. Talk to your pediatrician regarding the use of this medicine in children. While this medicine may be used in children as young as 1 year for selected conditions, precautions do apply. Overdosage: If you think you have taken too much of this medicine contact a poison control center or  emergency room at once. NOTE: This medicine is only for you. Do not share this medicine with others. What if I miss a dose? If you miss a dose, take it as soon as you can. If it is almost time for your next dose, take only that dose. Do not take double or extra doses. What may interact with this medicine? Do not take this medicine with any of the following medications: -epoetin alfa This list may not describe all possible interactions. Give your health care provider a list of all the medicines, herbs, non-prescription drugs, or dietary supplements you use. Also tell them if you smoke, drink alcohol, or use illegal drugs. Some items may interact with your medicine. What should I watch for while using this medicine? Visit your prescriber or health care professional for regular checks on your progress and for the needed blood tests and blood pressure measurements. It is especially important for the doctor to make sure your hemoglobin level is in the desired range, to limit the risk of potential side effects and to give you the best benefit. Keep all appointments for any recommended tests. Check your blood pressure as directed. Ask your doctor what your blood pressure should be and when you should contact him or her. As your body makes more red blood cells, you may need to take iron, folic acid, or vitamin B supplements. Ask your doctor or health care provider which products are right for you. If you have kidney disease continue dietary restrictions, even though this medication can make you feel better. Talk with your doctor or health   care professional about the foods you eat and the vitamins that you take. What side effects may I notice from receiving this medicine? Side effects that you should report to your doctor or health care professional as soon as possible: -allergic reactions like skin rash, itching or hives, swelling of the face, lips, or tongue -breathing problems -changes in vision -chest  pain -confusion, trouble speaking or understanding -feeling faint or lightheaded, falls -high blood pressure -muscle aches or pains -pain, swelling, warmth in the leg -rapid weight gain -severe headaches -sudden numbness or weakness of the face, arm or leg -trouble walking, dizziness, loss of balance or coordination -seizures (convulsions) -swelling of the ankles, feet, hands -unusually weak or tired Side effects that usually do not require medical attention (report to your doctor or health care professional if they continue or are bothersome): -diarrhea -fever, chills (flu-like symptoms) -headaches -nausea, vomiting -redness, stinging, or swelling at site where injected This list may not describe all possible side effects. Call your doctor for medical advice about side effects. You may report side effects to FDA at 1-800-FDA-1088. Where should I keep my medicine? Keep out of the reach of children. Store in a refrigerator between 2 and 8 degrees C (36 and 46 degrees F). Do not freeze. Do not shake. Throw away any unused portion if using a single-dose vial. Throw away any unused medicine after the expiration date. NOTE: This sheet is a summary. It may not cover all possible information. If you have questions about this medicine, talk to your doctor, pharmacist, or health care provider.  2015, [employer]. (2008-09-18 10:23:57) Zoledronic Acid injection (Hypercalcemia, Oncology) What is this medicine? ZOLEDRONIC ACID (ZOE le dron ik AS id) lowers the amount of calcium loss from bone. It is used to treat too much calcium in your blood from cancer. It is also used to prevent complications of cancer that has spread to the bone. This medicine may be used for other purposes; ask your health care provider or pharmacist if you have questions. COMMON BRAND NAME(S): Zometa What should I tell my health care provider before I take this medicine? They need to know if you have any of  these conditions: -aspirin-sensitive asthma -cancer, especially if you are receiving medicines used to treat cancer -dental disease or wear dentures -infection -kidney disease -receiving corticosteroids like dexamethasone or prednisone -an unusual or allergic reaction to zoledronic acid, other medicines, foods, dyes, or preservatives -pregnant or trying to get pregnant -breast-feeding How should I use this medicine? This medicine is for infusion into a vein. It is given by a health care professional in a hospital or clinic setting. Talk to your pediatrician regarding the use of this medicine in children. Special care may be needed. Overdosage: If you think you have taken too much of this medicine contact a poison control center or emergency room at once. NOTE: This medicine is only for you. Do not share this medicine with others. What if I miss a dose? It is important not to miss your dose. Call your doctor or health care professional if you are unable to keep an appointment. What may interact with this medicine? -certain antibiotics given by injection -NSAIDs, medicines for pain and inflammation, like ibuprofen or naproxen -some diuretics like bumetanide, furosemide -teriparatide -thalidomide This list may not describe all possible interactions. Give your health care provider a list of all the medicines, herbs, non-prescription drugs, or dietary supplements you use. Also tell them if you smoke, drink alcohol, or use illegal  drugs. Some items may interact with your medicine. What should I watch for while using this medicine? Visit your doctor or health care professional for regular checkups. It may be some time before you see the benefit from this medicine. Do not stop taking your medicine unless your doctor tells you to. Your doctor may order blood tests or other tests to see how you are doing. Women should inform their doctor if they wish to become pregnant or think they might be  pregnant. There is a potential for serious side effects to an unborn child. Talk to your health care professional or pharmacist for more information. You should make sure that you get enough calcium and vitamin D while you are taking this medicine. Discuss the foods you eat and the vitamins you take with your health care professional. Some people who take this medicine have severe bone, joint, and/or muscle pain. This medicine may also increase your risk for jaw problems or a broken thigh bone. Tell your doctor right away if you have severe pain in your jaw, bones, joints, or muscles. Tell your doctor if you have any pain that does not go away or that gets worse. Tell your dentist and dental surgeon that you are taking this medicine. You should not have major dental surgery while on this medicine. See your dentist to have a dental exam and fix any dental problems before starting this medicine. Take good care of your teeth while on this medicine. Make sure you see your dentist for regular follow-up appointments. What side effects may I notice from receiving this medicine? Side effects that you should report to your doctor or health care professional as soon as possible: -allergic reactions like skin rash, itching or hives, swelling of the face, lips, or tongue -anxiety, confusion, or depression -breathing problems -changes in vision -eye pain -feeling faint or lightheaded, falls -jaw pain, especially after dental work -mouth sores -muscle cramps, stiffness, or weakness -trouble passing urine or change in the amount of urine Side effects that usually do not require medical attention (report to your doctor or health care professional if they continue or are bothersome): -bone, joint, or muscle pain -constipation -diarrhea -fever -hair loss -irritation at site where injected -loss of appetite -nausea, vomiting -stomach upset -trouble sleeping -trouble swallowing -weak or tired This list may  not describe all possible side effects. Call your doctor for medical advice about side effects. You may report side effects to FDA at 1-800-FDA-1088. Where should I keep my medicine? This drug is given in a hospital or clinic and will not be stored at home. NOTE: This sheet is a summary. It may not cover all possible information. If you have questions about this medicine, talk to your doctor, pharmacist, or health care provider.  2015, [employer]. (2013-03-16 13:03:13) Ferumoxytol injection What is this medicine? FERUMOXYTOL is an iron complex. Iron is used to make healthy red blood cells, which carry oxygen and nutrients throughout the body. This medicine is used to treat iron deficiency anemia in people with chronic kidney disease. This medicine may be used for other purposes; ask your health care provider or pharmacist if you have questions. COMMON BRAND NAME(S): Feraheme What should I tell my health care provider before I take this medicine? They need to know if you have any of these conditions: -anemia not caused by low iron levels -high levels of iron in the blood -magnetic resonance imaging (MRI) test scheduled -an unusual or allergic reaction to iron, other medicines,  foods, dyes, or preservatives -pregnant or trying to get pregnant -breast-feeding How should I use this medicine? This medicine is for injection into a vein. It is given by a health care professional in a hospital or clinic setting. Talk to your pediatrician regarding the use of this medicine in children. Special care may be needed. Overdosage: If you think you've taken too much of this medicine contact a poison control center or emergency room at once. Overdosage: If you think you have taken too much of this medicine contact a poison control center or emergency room at once. NOTE: This medicine is only for you. Do not share this medicine with others. What if I miss a dose? It is important not to miss your  dose. Call your doctor or health care professional if you are unable to keep an appointment. What may interact with this medicine? This medicine may interact with the following medications: -other iron products This list may not describe all possible interactions. Give your health care provider a list of all the medicines, herbs, non-prescription drugs, or dietary supplements you use. Also tell them if you smoke, drink alcohol, or use illegal drugs. Some items may interact with your medicine. What should I watch for while using this medicine? Visit your doctor or healthcare professional regularly. Tell your doctor or healthcare professional if your symptoms do not start to get better or if they get worse. You may need blood work done while you are taking this medicine. You may need to follow a special diet. Talk to your doctor. Foods that contain iron include: whole grains/cereals, dried fruits, beans, or peas, leafy green vegetables, and organ meats (liver, kidney). What side effects may I notice from receiving this medicine? Side effects that you should report to your doctor or health care professional as soon as possible: -allergic reactions like skin rash, itching or hives, swelling of the face, lips, or tongue -breathing problems -changes in blood pressure -feeling faint or lightheaded, falls -fever or chills -flushing, sweating, or hot feelings -swelling of the ankles or feet Side effects that usually do not require medical attention (Report these to your doctor or health care professional if they continue or are bothersome.): -diarrhea -headache -nausea, vomiting -stomach pain This list may not describe all possible side effects. Call your doctor for medical advice about side effects. You may report side effects to FDA at 1-800-FDA-1088. Where should I keep my medicine? This drug is given in a hospital or clinic and will not be stored at home. NOTE: This sheet is a summary. It may not  cover all possible information. If you have questions about this medicine, talk to your doctor, pharmacist, or health care provider.  2015, [employer]. (2012-05-20 15:23:36)

## 2014-09-17 NOTE — Progress Notes (Signed)
Hematology and Oncology Follow Up Visit  Kaileen Bronkema [id number] Nov 21, 1931 80 y.o. 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  Avakian is back for followup. She really looks good. She is more active now. Her cardiac function is improving.  She had a great Thanksgiving. She was down at her son's home in [place]. She is down there for about a week and a half. She ate well. She's been more active. She's been walking more.  Her myeloma is responding nicely. Her monoclonal spike is now 0.60 g/dL. She only has one monoclonal spike now.  She's doing well with Revlimid. She very few toxicities from Revlimid.  She's had no problems with cough. She had no increased shortness of breath. There's been no leg swelling.  Her appetite is okay. She has had no nausea or vomiting  She's had no diarrhea.  There's been no rashes.  Currently, her performance status is ECOG 1.  Medications: Current outpatient prescriptions: aspirin EC 81 MG tablet, Take 162 mg by mouth daily., Disp: , Rfl: ;  calcium carbonate (OS-CAL) 600 MG TABS tablet, Take 1,200 mg by mouth daily with breakfast., Disp: , Rfl: ;  carvedilol (COREG) 3.125 MG tablet, Take 1 tablet (3.125 mg total) by mouth 2 (two) times daily with a meal. Take along with 6.25mg  (9.375 TOTAL), Disp: 180 tablet, Rfl: 3 Coenzyme Q10 (CO Q 10) 100 MG CAPS, Take 100 mg by mouth every morning. , Disp: , Rfl: ;  dexamethasone (DECADRON) 4 MG tablet, Take 5 tablets (20 mg total) by mouth once a week., Disp: 120 tablet, Rfl: 3;  digoxin (LANOXIN) 0.125 MG tablet, Take 1 tablet (0.125 mg total)  by mouth daily., Disp: 30 tablet, Rfl: 3;  lansoprazole (PREVACID) 30 MG capsule, Take 30 mg by mouth daily at 12 noon. Pt has not been taking daily., Disp: , Rfl:  lenalidomide (REVLIMID) 15 MG capsule, Take 1 capsule (15 mg total) by mouth daily. Take for 21 days. Off for 7 days. [id number]#[id number], Disp: 21 capsule, Rfl: 0;  lisinopril (PRINIVIL,ZESTRIL) 2.5 MG tablet, Take 1 tablet (2.5 mg total) by mouth daily., Disp: 30 tablet, Rfl: 3;  potassium chloride SA (K-DUR,KLOR-CON) 20 MEQ tablet, Take 1 tablet (20 mEq total) by mouth daily., Disp: 30 tablet, Rfl: 3 spironolactone (ALDACTONE) 25 MG tablet, Take 1 tablet (25 mg total) by mouth daily., Disp: 30 tablet, Rfl: 6;  torsemide (DEMADEX) 20 MG tablet, Take one tab (20mg ) every other day, Disp: 30 tablet, Rfl: 3;  fentaNYL (DURAGESIC) 12 MCG/HR, Place 1 patch (12.5 mcg total) onto the skin every 3 (three) days. (Patient not taking: Reported on 09/17/2014), Disp: 10 patch, Rfl: 0 HYDROmorphone (DILAUDID) 2 MG tablet, Take 2 mg by mouth every 6 (six) hours as needed (pain). , Disp: , Rfl: ;  omeprazole (PRILOSEC) 20 MG capsule, Take 1 capsule (20 mg total) by mouth every morning., Disp: 30 capsule, Rfl: 6;  omeprazole (PRILOSEC) 20 MG capsule, TAKE 1 CAPSULE BY MOUTH EVERY MORNING, Disp: 30 capsule, Rfl: 0;  polyethylene glycol (MIRALAX / GLYCOLAX) packet,  Take 17 g by mouth daily., Disp: , Rfl:  No current facility-administered medications for this visit. Facility-Administered Medications Ordered in Other Visits: sodium chloride 0.9 % injection 10 mL, 10 mL, Intracatheter, PRN, [name], MD, 10 mL at 04/16/14 1332  Allergies: No Known Allergies  Past Medical History, Surgical history, Social history, and Family History were reviewed and updated.  Review of Systems: As above  Physical Exam:  height is 5\' 5"  (1.651 m) and weight is 135 lb (61.236 kg). Her oral temperature is 98.2 F (36.8 C). Her blood pressure is 137/67 and her pulse is 69. Her  respiration is 16.   Elderly, thin white female in no obvious distress. Head and neck exam shows no ocular or oral lesions. There are no palpable cervical or supraclavicular lymph nodes. Lungs are clear. Cardiac exam regular rate and rhythm. She has an occasional extra beat. She has a 1/6 systolic ejection murmur. Abdomen is soft. She has good bowel sounds. There is no fluid wave. There is no palpable liver or spleen tip. Extremities shows no clubbing, cyanosis or edema. She has some trace edema in her lower legs. She has some age-related osteoarthritic changes. Breast exam shows left breast with no masses, edema or erythema. There is no left axillary adenopathy. Right chest wall shows a well-healed mastectomy. No right chest wall nodules are noted. There is no right axillary adenopathy. Neurological exam shows no focal neurological deficits.  Lab Results  Component Value Date   WBC 4.3 09/17/2014   HGB 9.3* 09/17/2014   HCT 29.3* 09/17/2014   MCV 111* 09/17/2014   PLT 249 Few Large platelets present 09/17/2014     Chemistry      Component Value Date/Time   NA 147* 09/17/2014 0903   NA 138 06/22/2014 1035   K 4.2 09/17/2014 0903   K 4.6 06/22/2014 1035   CL 107 09/17/2014 0903   CL 106 06/22/2014 1035   CO2 27 09/17/2014 0903   CO2 23 06/22/2014 1035   BUN 20 09/17/2014 0903   BUN 37* 06/22/2014 1035   CREATININE 1.5* 09/17/2014 0903   CREATININE 1.48* 06/22/2014 1035      Component Value Date/Time   CALCIUM 9.3 09/17/2014 0903   CALCIUM 9.0 06/22/2014 1035   CALCIUM 11.1* 11/13/2012 1526   ALKPHOS 74 09/17/2014 0903   ALKPHOS 78 06/22/2014 1035   AST 22 09/17/2014 0903   AST 13 06/22/2014 1035   ALT 20 09/17/2014 0903   ALT 16 06/22/2014 1035   BILITOT 0.70 09/17/2014 0903   BILITOT 0.4 06/22/2014 1035         Impression and Plan: Ms. Grisanti is 80 year old white female. She has both IgA kappa myeloma and stage II breast cancer. She continues to look quite good. She's  responding. She now only has one monoclonal spike.  We will go ahead and give her Zometa today. Her renal function is looking better.  Her hemoglobin is about the same. I will give her a dose of Aranesp today. I will also give her a dose of IV iron. Ova, this will help with her hemoglobin. I will plan to get her back in another month.  I spent about 30 minutes with her today. She came by her self. This usually is a good sign.   [name], MD 11/30/20156:49 PM

## 2014-09-19 ENCOUNTER — Telehealth: Payer: Self-pay | Admitting: *Deleted

## 2014-09-19 LAB — PROTEIN ELECTROPHORESIS, SERUM, WITH REFLEX
ALBUMIN ELP: 52.1 % — AB (ref 55.8–66.1)
Alpha-1-Globulin: 6.7 % — ABNORMAL HIGH (ref 2.9–4.9)
Alpha-2-Globulin: 14.8 % — ABNORMAL HIGH (ref 7.1–11.8)
Beta 2: 5.4 % (ref 3.2–6.5)
Beta Globulin: 6.3 % (ref 4.7–7.2)
Gamma Globulin: 14.7 % (ref 11.1–18.8)
M-SPIKE, %: 0.42 g/dL
TOTAL PROTEIN, SERUM ELECTROPHOR: 6.2 g/dL (ref 6.0–8.3)

## 2014-09-19 LAB — IFE INTERPRETATION

## 2014-09-19 LAB — KAPPA/LAMBDA LIGHT CHAINS
Kappa free light chain: 2.83 mg/dL — ABNORMAL HIGH (ref 0.33–1.94)
Kappa:Lambda Ratio: 2.98 — ABNORMAL HIGH (ref 0.26–1.65)
Lambda Free Lght Chn: 0.95 mg/dL (ref 0.57–2.63)

## 2014-09-19 LAB — IGG, IGA, IGM
IgA: 730 mg/dL — ABNORMAL HIGH (ref 69–380)
IgG (Immunoglobin G), Serum: 448 mg/dL — ABNORMAL LOW (ref 690–1700)
IgM, Serum: 49 mg/dL — ABNORMAL LOW (ref 52–322)

## 2014-09-19 NOTE — Telephone Encounter (Signed)
-----   Message from [name], MD sent at 09/19/2014  6:38 AM EST ----- Call - myeloma is still getting better!! Merry Christmas!!  Jacqueline Rivas

## 2014-09-27 ENCOUNTER — Other Ambulatory Visit: Payer: Self-pay | Admitting: Nurse Practitioner

## 2014-09-27 DIAGNOSIS — C9000 Multiple myeloma not having achieved remission: Secondary | ICD-10-CM

## 2014-09-27 MED ORDER — LENALIDOMIDE 15 MG PO CAPS: 15.0000 mg | ORAL_CAPSULE | Freq: Every day | ORAL | Status: DC

## 2014-10-17 ENCOUNTER — Ambulatory Visit: Payer: Medicare Other

## 2014-10-17 ENCOUNTER — Other Ambulatory Visit: Payer: Medicare Other | Admitting: Lab

## 2014-10-17 ENCOUNTER — Ambulatory Visit: Payer: Medicare Other | Admitting: Hematology & Oncology

## 2014-10-24 ENCOUNTER — Other Ambulatory Visit: Payer: Self-pay | Admitting: Nurse Practitioner

## 2014-10-24 DIAGNOSIS — C9000 Multiple myeloma not having achieved remission: Secondary | ICD-10-CM

## 2014-10-24 MED ORDER — LENALIDOMIDE 15 MG PO CAPS: 15.0000 mg | ORAL_CAPSULE | Freq: Every day | ORAL | Status: DC

## 2014-10-26 ENCOUNTER — Ambulatory Visit (HOSPITAL_BASED_OUTPATIENT_CLINIC_OR_DEPARTMENT_OTHER): Payer: Medicare Other | Admitting: Hematology & Oncology

## 2014-10-26 ENCOUNTER — Ambulatory Visit (HOSPITAL_BASED_OUTPATIENT_CLINIC_OR_DEPARTMENT_OTHER): Payer: Medicare Other | Admitting: Lab

## 2014-10-26 ENCOUNTER — Ambulatory Visit (HOSPITAL_BASED_OUTPATIENT_CLINIC_OR_DEPARTMENT_OTHER): Payer: Medicare Other

## 2014-10-26 ENCOUNTER — Encounter: Payer: Self-pay | Admitting: Hematology & Oncology

## 2014-10-26 VITALS — BP 150/76 | HR 78 | Temp 98.2°F | Resp 16 | Ht 65.0 in | Wt 137.0 lb

## 2014-10-26 DIAGNOSIS — C50811 Malignant neoplasm of overlapping sites of right female breast: Secondary | ICD-10-CM | POA: Diagnosis not present

## 2014-10-26 DIAGNOSIS — D630 Anemia in neoplastic disease: Secondary | ICD-10-CM | POA: Diagnosis not present

## 2014-10-26 DIAGNOSIS — I429 Cardiomyopathy, unspecified: Secondary | ICD-10-CM

## 2014-10-26 DIAGNOSIS — C9000 Multiple myeloma not having achieved remission: Secondary | ICD-10-CM

## 2014-10-26 DIAGNOSIS — C773 Secondary and unspecified malignant neoplasm of axilla and upper limb lymph nodes: Secondary | ICD-10-CM | POA: Diagnosis not present

## 2014-10-26 LAB — CBC WITH DIFFERENTIAL (CANCER CENTER ONLY)
BASO#: 0.1 10*3/uL (ref 0.0–0.2)
BASO%: 3.5 % — ABNORMAL HIGH (ref 0.0–2.0)
EOS%: 1.3 % (ref 0.0–7.0)
Eosinophils Absolute: 0.1 10*3/uL (ref 0.0–0.5)
HEMATOCRIT: 36.2 % (ref 34.8–46.6)
HGB: 11.9 g/dL (ref 11.6–15.9)
LYMPH#: 0.8 10*3/uL — AB (ref 0.9–3.3)
LYMPH%: 20.1 % (ref 14.0–48.0)
MCH: 36.5 pg — ABNORMAL HIGH (ref 26.0–34.0)
MCHC: 32.9 g/dL (ref 32.0–36.0)
MCV: 111 fL — ABNORMAL HIGH (ref 81–101)
MONO#: 0.9 10*3/uL (ref 0.1–0.9)
MONO%: 21.6 % — AB (ref 0.0–13.0)
NEUT#: 2.1 10*3/uL (ref 1.5–6.5)
NEUT%: 53.5 % (ref 39.6–80.0)
PLATELETS: 187 10*3/uL (ref 145–400)
RBC: 3.26 10*6/uL — AB (ref 3.70–5.32)
RDW: 15.5 % (ref 11.1–15.7)
WBC: 4 10*3/uL (ref 3.9–10.0)

## 2014-10-26 LAB — CMP (CANCER CENTER ONLY)
ALT: 19 U/L (ref 10–47)
AST: 24 U/L (ref 11–38)
Albumin: 3.4 g/dL (ref 3.3–5.5)
Alkaline Phosphatase: 82 U/L (ref 26–84)
BUN: 20 mg/dL (ref 7–22)
CALCIUM: 9.8 mg/dL (ref 8.0–10.3)
CO2: 27 mEq/L (ref 18–33)
Chloride: 105 mEq/L (ref 98–108)
Creat: 1.3 mg/dl — ABNORMAL HIGH (ref 0.6–1.2)
Glucose, Bld: 101 mg/dL (ref 73–118)
Potassium: 3.9 mEq/L (ref 3.3–4.7)
Sodium: 145 mEq/L (ref 128–145)
Total Bilirubin: 0.8 mg/dl (ref 0.20–1.60)
Total Protein: 7.2 g/dL (ref 6.4–8.1)

## 2014-10-26 LAB — CHCC SATELLITE - SMEAR

## 2014-10-26 MED ORDER — HEPARIN SOD (PORK) LOCK FLUSH 100 UNIT/ML IV SOLN
500.0000 [IU] | Freq: Once | INTRAVENOUS | Status: AC
  Administered 2014-10-26: 500 [IU] via INTRAVENOUS
  Filled 2014-10-26: qty 5

## 2014-10-26 MED ORDER — ZOLEDRONIC ACID 4 MG/5ML IV CONC
3.0000 mg | Freq: Once | INTRAVENOUS | Status: AC
  Administered 2014-10-26: 3 mg via INTRAVENOUS
  Filled 2014-10-26: qty 3.75

## 2014-10-26 MED ORDER — SODIUM CHLORIDE 0.9 % IJ SOLN
10.0000 mL | Freq: Once | INTRAMUSCULAR | Status: AC
  Administered 2014-10-26: 10 mL via INTRAVENOUS
  Filled 2014-10-26: qty 10

## 2014-10-26 NOTE — Patient Instructions (Signed)

## 2014-10-29 NOTE — Progress Notes (Signed)
Hematology and Oncology Follow Up Visit  Jacqueline Rivas [id number] Oct 10, 1932 81 y.o. 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 is back for followup. She really enjoyed the holidays. She is with her family. She was down in [place]. She really looks good. She is more active now. Her cardiac function is improving. She still seeing a cardiologist. Her last ejection fraction in July was 35%. I am relying on the cardiologist to follow-up on her cardiac function.   Her myeloma is responding nicely. Her monoclonal spike is now 0.42 g/dL. She only has one monoclonal spike now.  She's doing well with Revlimid. She very few toxicities from Revlimid.  She's had no problems with cough. She had no increased shortness of breath. There's been no leg swelling.  Her appetite is okay. She has had no nausea or vomiting  She's had no diarrhea.  There's been no rashes.  She still is not exercising that much. I told her that really exercise is going be very critical for her.  Currently, her performance status is ECOG 1.  Medications: Current outpatient prescriptions: aspirin EC 81 MG tablet, Take 162 mg by mouth daily., Disp: , Rfl: ;  calcium carbonate (OS-CAL) 600 MG TABS tablet, Take 1,200 mg by mouth daily with breakfast., Disp: , Rfl: ;  carvedilol (COREG) 3.125 MG tablet, Take 1 tablet (3.125 mg total) by mouth 2 (two) times daily with a meal. Take along with 6.25mg  (9.375 TOTAL), Disp: 180 tablet, Rfl: 3 Coenzyme Q10 (CO Q 10) 100 MG CAPS, Take 100 mg by mouth every morning. , Disp: , Rfl: ;  dexamethasone (DECADRON) 4 MG  tablet, Take 5 tablets (20 mg total) by mouth once a week., Disp: 120 tablet, Rfl: 3;  digoxin (LANOXIN) 0.125 MG tablet, Take 1 tablet (0.125 mg total) by mouth daily., Disp: 30 tablet, Rfl: 3;  lansoprazole (PREVACID) 30 MG capsule, Take 30 mg by mouth daily at 12 noon. Pt has not been taking daily., Disp: , Rfl:  lenalidomide (REVLIMID) 15 MG capsule, Take 1 capsule (15 mg total) by mouth daily. Take for 21 days. Off for 7 days. OEVO#[id number], Disp: 21 capsule, Rfl: 0;  lisinopril (PRINIVIL,ZESTRIL) 2.5 MG tablet, Take 1 tablet (2.5 mg total) by mouth daily., Disp: 30 tablet, Rfl: 3;  magnesium 30 MG tablet, Take 30 mg by mouth every morning., Disp: , Rfl: ;  omeprazole (PRILOSEC) 20 MG capsule, TAKE 1 CAPSULE BY MOUTH EVERY MORNING, Disp: 30 capsule, Rfl: 0 polyethylene glycol (MIRALAX / GLYCOLAX) packet, Take 17 g by mouth daily., Disp: , Rfl: ;  potassium chloride SA (K-DUR,KLOR-CON) 20 MEQ tablet, Take 1 tablet (20 mEq total) by mouth daily., Disp: 30 tablet, Rfl: 3;  spironolactone (ALDACTONE) 25 MG tablet, Take 1 tablet (25 mg total) by mouth daily., Disp: 30 tablet, Rfl: 6;  torsemide (DEMADEX) 20 MG tablet, Take one tab (20mg ) every other day, Disp: 30 tablet, Rfl: 3 fentaNYL (DURAGESIC) 12 MCG/HR, Place 1 patch (12.5 mcg total) onto the skin every 3 (three) days. (Patient not taking: Reported  on 09/17/2014), Disp: 10 patch, Rfl: 0;  HYDROmorphone (DILAUDID) 2 MG tablet, Take 2 mg by mouth every 6 (six) hours as needed (pain). , Disp: , Rfl:  No current facility-administered medications for this visit. Facility-Administered Medications Ordered in Other Visits: sodium chloride 0.9 % injection 10 mL, 10 mL, Intracatheter, PRN, [name], MD, 10 mL at 04/16/14 1332  Allergies: No Known Allergies  Past Medical History, Surgical history, Social history, and Family History were reviewed and updated.  Review of Systems: As above  Physical Exam:  height is 5\' 5"  (1.651 m) and weight is 137 lb  (62.143 kg). Her oral temperature is 98.2 F (36.8 C). Her blood pressure is 150/76 and her pulse is 78. Her respiration is 16.   Elderly, thin white female in no obvious distress. Head and neck exam shows no ocular or oral lesions. There are no palpable cervical or supraclavicular lymph nodes. Lungs are clear. Cardiac exam regular rate and rhythm. She has an occasional extra beat. She has a 1/6 systolic ejection murmur. Abdomen is soft. She has good bowel sounds. There is no fluid wave. There is no palpable liver or spleen tip. Extremities shows no clubbing, cyanosis or edema. She has some trace edema in her lower legs. She has some age-related osteoarthritic changes. Breast exam shows left breast with no masses, edema or erythema. There is no left axillary adenopathy. Right chest wall shows a well-healed mastectomy. No right chest wall nodules are noted. There is no right axillary adenopathy. Neurological exam shows no focal neurological deficits.  Lab Results  Component Value Date   WBC 4.0 10/26/2014   HGB 11.9 10/26/2014   HCT 36.2 10/26/2014   MCV 111* 10/26/2014   PLT 187 10/26/2014     Chemistry      Component Value Date/Time   NA 145 10/26/2014 0842   NA 138 06/22/2014 1035   K 3.9 10/26/2014 0842   K 4.6 06/22/2014 1035   CL 105 10/26/2014 0842   CL 106 06/22/2014 1035   CO2 27 10/26/2014 0842   CO2 23 06/22/2014 1035   BUN 20 10/26/2014 0842   BUN 37* 06/22/2014 1035   CREATININE 1.3* 10/26/2014 0842   CREATININE 1.48* 06/22/2014 1035      Component Value Date/Time   CALCIUM 9.8 10/26/2014 0842   CALCIUM 9.0 06/22/2014 1035   CALCIUM 11.1* 11/13/2012 1526   ALKPHOS 82 10/26/2014 0842   ALKPHOS 78 06/22/2014 1035   AST 24 10/26/2014 0842   AST 13 06/22/2014 1035   ALT 19 10/26/2014 0842   ALT 16 06/22/2014 1035   BILITOT 0.80 10/26/2014 0842   BILITOT 0.4 06/22/2014 1035     IgA level is 692 mg/dL.Kappa light chain is 6.5 g/dL.    Impression and Plan: [name] is 81 year old white female. She has both IgA kappa myeloma and stage II breast cancer. She continues to look quite good. She's responding. She now only has one monoclonal spike.  We will go ahead and give her Zometa today. Her renal function is looking better.  Her hemoglobin is better. Hopefully,, this is a sign that the myeloma is improving. She does not need any Aranesp today.  I spent about 30 minutes with her today. She came by her self. This usually is a good sign.   [name], MD 1/11/20166:36 PM

## 2014-10-31 ENCOUNTER — Telehealth: Payer: Self-pay | Admitting: *Deleted

## 2014-10-31 LAB — PROTEIN ELECTROPHORESIS, SERUM, WITH REFLEX
Albumin ELP: 55.3 % — ABNORMAL LOW (ref 55.8–66.1)
Alpha-1-Globulin: 5.4 % — ABNORMAL HIGH (ref 2.9–4.9)
Alpha-2-Globulin: 13 % — ABNORMAL HIGH (ref 7.1–11.8)
Beta 2: 4.9 % (ref 3.2–6.5)
Beta Globulin: 6.2 % (ref 4.7–7.2)
Gamma Globulin: 15.2 % (ref 11.1–18.8)
M-SPIKE, %: 0.49 g/dL
TOTAL PROTEIN, SERUM ELECTROPHOR: 6.6 g/dL (ref 6.0–8.3)

## 2014-10-31 LAB — IFE INTERPRETATION

## 2014-10-31 LAB — IGG, IGA, IGM
IGA: 692 mg/dL — AB (ref 69–380)
IGG (IMMUNOGLOBIN G), SERUM: 421 mg/dL — AB (ref 690–1700)
IgM, Serum: 50 mg/dL — ABNORMAL LOW (ref 52–322)

## 2014-10-31 LAB — KAPPA/LAMBDA LIGHT CHAINS
Kappa free light chain: 6.52 mg/dL — ABNORMAL HIGH (ref 0.33–1.94)
Kappa:Lambda Ratio: 4.44 — ABNORMAL HIGH (ref 0.26–1.65)
LAMBDA FREE LGHT CHN: 1.47 mg/dL (ref 0.57–2.63)

## 2014-10-31 LAB — RETICULOCYTES (CHCC)
ABS RETIC: 43.3 10*3/uL (ref 19.0–186.0)
RBC.: 3.33 MIL/uL — ABNORMAL LOW (ref 3.87–5.11)
RETIC CT PCT: 1.3 % (ref 0.4–2.3)

## 2014-10-31 NOTE — Telephone Encounter (Signed)
-----   Message from [name], MD sent at 10/30/2014  7:05 PM EST ----- Call - myeloma is stable.  pete

## 2014-11-21 ENCOUNTER — Other Ambulatory Visit: Payer: Self-pay | Admitting: Nurse Practitioner

## 2014-11-21 DIAGNOSIS — C9000 Multiple myeloma not having achieved remission: Secondary | ICD-10-CM

## 2014-11-21 MED ORDER — LENALIDOMIDE 15 MG PO CAPS: 15.0000 mg | ORAL_CAPSULE | Freq: Every day | ORAL | Status: DC

## 2014-11-23 ENCOUNTER — Ambulatory Visit (HOSPITAL_BASED_OUTPATIENT_CLINIC_OR_DEPARTMENT_OTHER): Payer: Medicare Other | Admitting: Hematology & Oncology

## 2014-11-23 ENCOUNTER — Ambulatory Visit (HOSPITAL_BASED_OUTPATIENT_CLINIC_OR_DEPARTMENT_OTHER): Payer: Medicare Other | Admitting: Lab

## 2014-11-23 ENCOUNTER — Ambulatory Visit (HOSPITAL_BASED_OUTPATIENT_CLINIC_OR_DEPARTMENT_OTHER): Payer: Medicare Other

## 2014-11-23 VITALS — BP 132/67 | HR 80 | Temp 97.9°F | Resp 16 | Wt 140.0 lb

## 2014-11-23 DIAGNOSIS — C9000 Multiple myeloma not having achieved remission: Secondary | ICD-10-CM | POA: Diagnosis not present

## 2014-11-23 DIAGNOSIS — C50911 Malignant neoplasm of unspecified site of right female breast: Secondary | ICD-10-CM | POA: Diagnosis not present

## 2014-11-23 DIAGNOSIS — D630 Anemia in neoplastic disease: Secondary | ICD-10-CM

## 2014-11-23 LAB — CBC WITH DIFFERENTIAL (CANCER CENTER ONLY)
BASO#: 0.1 10*3/uL (ref 0.0–0.2)
BASO%: 1.2 % (ref 0.0–2.0)
EOS%: 4.7 % (ref 0.0–7.0)
Eosinophils Absolute: 0.2 10*3/uL (ref 0.0–0.5)
HEMATOCRIT: 37.6 % (ref 34.8–46.6)
HGB: 12.2 g/dL (ref 11.6–15.9)
LYMPH#: 0.9 10*3/uL (ref 0.9–3.3)
LYMPH%: 17.7 % (ref 14.0–48.0)
MCH: 35.7 pg — ABNORMAL HIGH (ref 26.0–34.0)
MCHC: 32.4 g/dL (ref 32.0–36.0)
MCV: 110 fL — ABNORMAL HIGH (ref 81–101)
MONO#: 0.8 10*3/uL (ref 0.1–0.9)
MONO%: 16.7 % — AB (ref 0.0–13.0)
NEUT%: 59.7 % (ref 39.6–80.0)
NEUTROS ABS: 2.9 10*3/uL (ref 1.5–6.5)
PLATELETS: 177 10*3/uL (ref 145–400)
RBC: 3.42 10*6/uL — ABNORMAL LOW (ref 3.70–5.32)
RDW: 14.7 % (ref 11.1–15.7)
WBC: 4.9 10*3/uL (ref 3.9–10.0)

## 2014-11-23 LAB — CMP (CANCER CENTER ONLY)
ALT: 24 U/L (ref 10–47)
AST: 23 U/L (ref 11–38)
Albumin: 3.4 g/dL (ref 3.3–5.5)
Alkaline Phosphatase: 71 U/L (ref 26–84)
BUN, Bld: 26 mg/dL — ABNORMAL HIGH (ref 7–22)
CALCIUM: 8.6 mg/dL (ref 8.0–10.3)
CO2: 26 mEq/L (ref 18–33)
Chloride: 102 mEq/L (ref 98–108)
Creat: 1.2 mg/dl (ref 0.6–1.2)
Glucose, Bld: 94 mg/dL (ref 73–118)
Potassium: 4.2 mEq/L (ref 3.3–4.7)
Sodium: 140 mEq/L (ref 128–145)
TOTAL PROTEIN: 6.9 g/dL (ref 6.4–8.1)
Total Bilirubin: 1 mg/dl (ref 0.20–1.60)

## 2014-11-23 MED ORDER — ALTEPLASE 2 MG IJ SOLR
2.0000 mg | Freq: Once | INTRAMUSCULAR | Status: DC | PRN
  Filled 2014-11-23: qty 2

## 2014-11-23 MED ORDER — ZOLEDRONIC ACID 4 MG/5ML IV CONC
3.0000 mg | Freq: Once | INTRAVENOUS | Status: AC
  Administered 2014-11-23: 3 mg via INTRAVENOUS
  Filled 2014-11-23: qty 3.75

## 2014-11-23 NOTE — Patient Instructions (Signed)

## 2014-11-26 NOTE — Progress Notes (Signed)
Hematology and Oncology Follow Up Visit  Jacqueline Rivas [id number] 1932-04-01 81 y.o. 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 is back for followup. She looks pretty good. She really has had no problems with the Revlimid. Her cardiac function is slowly improving.  Her last monoclonal studies showed stability with the myeloma.   Her myeloma is responding nicely. Her monoclonal spike is now 0.49 g/dL. her Kappa light chain was 6.52 mg/dL. She only has one monoclonal spike now.  She's doing well with Revlimid. She very few toxicities from Revlimid.  She's had no problems with cough. She had no increased shortness of breath. There's been no leg swelling.  Her appetite is okay. She has had no nausea or vomiting  She's had no diarrhea.  There's been no rashes.  She still is not exercising that much. I told her that really exercise is going be very critical for her.  Currently, her performance status is ECOG 1.  Medications:  Current outpatient prescriptions:  .  aspirin EC 81 MG tablet, Take 162 mg by mouth daily., Disp: , Rfl:  .  calcium carbonate (OS-CAL) 600 MG TABS tablet, Take 1,200 mg by mouth daily with breakfast., Disp: , Rfl:  .  carvedilol (COREG) 3.125 MG tablet, Take 1 tablet (3.125 mg total) by mouth 2 (two) times daily with a meal. Take along with 6.25mg  (9.375 TOTAL), Disp: 180 tablet, Rfl: 3 .  Coenzyme Q10 (CO Q 10) 100 MG CAPS, Take 100 mg by mouth every morning. , Disp: , Rfl:  .  dexamethasone (DECADRON) 4 MG tablet, Take 5 tablets (20 mg total) by mouth once a week., Disp: 120 tablet,  Rfl: 3 .  digoxin (LANOXIN) 0.125 MG tablet, Take 1 tablet (0.125 mg total) by mouth daily., Disp: 30 tablet, Rfl: 3 .  fentaNYL (DURAGESIC) 12 MCG/HR, Place 1 patch (12.5 mcg total) onto the skin every 3 (three) days. (Patient not taking: Reported on 09/17/2014), Disp: 10 patch, Rfl: 0 .  HYDROmorphone (DILAUDID) 2 MG tablet, Take 2 mg by mouth every 6 (six) hours as needed (pain). , Disp: , Rfl:  .  lansoprazole (PREVACID) 30 MG capsule, Take 30 mg by mouth daily at 12 noon. Pt has not been taking daily., Disp: , Rfl:  .  lenalidomide (REVLIMID) 15 MG capsule, Take 1 capsule (15 mg total) by mouth daily. Take for 21 days. Off for 7 days. BJYN#[id number], Disp: 21 capsule, Rfl: 0 .  lisinopril (PRINIVIL,ZESTRIL) 2.5 MG tablet, Take 1 tablet (2.5 mg total) by mouth daily., Disp: 30 tablet, Rfl: 3 .  magnesium 30 MG tablet, Take 30 mg by mouth every morning., Disp: , Rfl:  .  omeprazole (PRILOSEC) 20 MG capsule, TAKE 1 CAPSULE BY MOUTH EVERY MORNING, Disp: 30 capsule, Rfl: 0 .  polyethylene glycol (MIRALAX / GLYCOLAX) packet, Take 17 g by mouth daily., Disp: , Rfl:  .  potassium chloride SA (K-DUR,KLOR-CON) 20 MEQ tablet, Take 1 tablet (20 mEq total) by mouth daily., Disp: 30 tablet, Rfl: 3 .  spironolactone (ALDACTONE)  25 MG tablet, Take 1 tablet (25 mg total) by mouth daily., Disp: 30 tablet, Rfl: 6 .  torsemide (DEMADEX) 20 MG tablet, Take one tab (20mg ) every other day, Disp: 30 tablet, Rfl: 3 No current facility-administered medications for this visit.  Facility-Administered Medications Ordered in Other Visits:  .  sodium chloride 0.9 % injection 10 mL, 10 mL, Intracatheter, PRN, [name], MD, 10 mL at 04/16/14 1332  Allergies: No Known Allergies  Past Medical History, Surgical history, Social history, and Family History were reviewed and updated.  Review of Systems: As above  Physical Exam:  weight is 140 lb (63.504 kg). Her oral temperature is 97.9 F (36.6 C). Her blood pressure  is 132/67 and her pulse is 80. Her respiration is 16.   Elderly, thin white female in no obvious distress. Head and neck exam shows no ocular or oral lesions. There are no palpable cervical or supraclavicular lymph nodes. Lungs are clear. Cardiac exam regular rate and rhythm. She has an occasional extra beat. She has a 1/6 systolic ejection murmur. Abdomen is soft. She has good bowel sounds. There is no fluid wave. There is no palpable liver or spleen tip. Extremities shows no clubbing, cyanosis or edema. She has some trace edema in her lower legs. She has some age-related osteoarthritic changes. Breast exam shows left breast with no masses, edema or erythema. There is no left axillary adenopathy. Right chest wall shows a well-healed mastectomy. No right chest wall nodules are noted. There is no right axillary adenopathy. Neurological exam shows no focal neurological deficits.  Lab Results  Component Value Date   WBC 4.9 11/23/2014   HGB 12.2 11/23/2014   HCT 37.6 11/23/2014   MCV 110* 11/23/2014   PLT 177 11/23/2014     Chemistry      Component Value Date/Time   NA 140 11/23/2014 0906   NA 138 06/22/2014 1035   K 4.2 11/23/2014 0906   K 4.6 06/22/2014 1035   CL 102 11/23/2014 0906   CL 106 06/22/2014 1035   CO2 26 11/23/2014 0906   CO2 23 06/22/2014 1035   BUN 26* 11/23/2014 0906   BUN 37* 06/22/2014 1035   CREATININE 1.2 11/23/2014 0906   CREATININE 1.48* 06/22/2014 1035      Component Value Date/Time   CALCIUM 8.6 11/23/2014 0906   CALCIUM 9.0 06/22/2014 1035   CALCIUM 11.1* 11/13/2012 1526   ALKPHOS 71 11/23/2014 0906   ALKPHOS 78 06/22/2014 1035   AST 23 11/23/2014 0906   AST 13 06/22/2014 1035   ALT 24 11/23/2014 0906   ALT 16 06/22/2014 1035   BILITOT 1.00 11/23/2014 0906   BILITOT 0.4 06/22/2014 1035         Impression and Plan: Jacqueline Rivas is 81 year old white female. She has both IgA kappa myeloma and stage II breast cancer. She continues to look quite good.  She's responding. She now only has one monoclonal spike.  We will go ahead and give her Zometa today. Her renal function is looking better.  Her hemoglobin is better. Hopefully,, this is a sign that the myeloma is improving. She does not need any Aranesp today.  I spent about 30 minutes with her today. She came by her self. This usually is a good sign.   [name], MD 2/8/20167:17 AM

## 2014-11-27 ENCOUNTER — Other Ambulatory Visit: Payer: Self-pay | Admitting: Family

## 2014-11-28 LAB — PROTEIN ELECTROPHORESIS, SERUM, WITH REFLEX
ALBUMIN ELP: 57.2 % (ref 55.8–66.1)
Alpha-1-Globulin: 5.3 % — ABNORMAL HIGH (ref 2.9–4.9)
Alpha-2-Globulin: 12.8 % — ABNORMAL HIGH (ref 7.1–11.8)
BETA 2: 4.3 % (ref 3.2–6.5)
BETA GLOBULIN: 6.3 % (ref 4.7–7.2)
Gamma Globulin: 14.1 % (ref 11.1–18.8)
M-Spike, %: 0.44 g/dL
Total Protein, Serum Electrophoresis: 6.5 g/dL (ref 6.0–8.3)

## 2014-11-28 LAB — IGG, IGA, IGM
IGG (IMMUNOGLOBIN G), SERUM: 440 mg/dL — AB (ref 690–1700)
IgA: 629 mg/dL — ABNORMAL HIGH (ref 69–380)
IgM, Serum: 49 mg/dL — ABNORMAL LOW (ref 52–322)

## 2014-11-28 LAB — LACTATE DEHYDROGENASE: LDH: 105 U/L (ref 94–250)

## 2014-11-28 LAB — IFE INTERPRETATION

## 2014-11-28 LAB — KAPPA/LAMBDA LIGHT CHAINS
KAPPA FREE LGHT CHN: 7.55 mg/dL — AB (ref 0.33–1.94)
KAPPA LAMBDA RATIO: 5.43 — AB (ref 0.26–1.65)
Lambda Free Lght Chn: 1.39 mg/dL (ref 0.57–2.63)

## 2014-11-29 ENCOUNTER — Telehealth: Payer: Self-pay | Admitting: *Deleted

## 2014-11-29 NOTE — Telephone Encounter (Signed)
-----   Message from [name], MD sent at 11/28/2014  5:08 PM EST ----- Call - myeloma is nice and stable!!  pete

## 2014-12-20 ENCOUNTER — Other Ambulatory Visit: Payer: Self-pay | Admitting: *Deleted

## 2014-12-20 DIAGNOSIS — C9000 Multiple myeloma not having achieved remission: Secondary | ICD-10-CM

## 2014-12-20 MED ORDER — LENALIDOMIDE 15 MG PO CAPS: 15.0000 mg | ORAL_CAPSULE | Freq: Every day | ORAL | Status: DC

## 2014-12-21 ENCOUNTER — Ambulatory Visit (HOSPITAL_BASED_OUTPATIENT_CLINIC_OR_DEPARTMENT_OTHER): Payer: Medicare Other

## 2014-12-21 ENCOUNTER — Ambulatory Visit (HOSPITAL_BASED_OUTPATIENT_CLINIC_OR_DEPARTMENT_OTHER): Payer: Medicare Other | Admitting: Lab

## 2014-12-21 ENCOUNTER — Ambulatory Visit (HOSPITAL_BASED_OUTPATIENT_CLINIC_OR_DEPARTMENT_OTHER): Payer: Medicare Other | Admitting: Hematology & Oncology

## 2014-12-21 VITALS — BP 137/73 | HR 73 | Temp 97.8°F

## 2014-12-21 DIAGNOSIS — C9000 Multiple myeloma not having achieved remission: Secondary | ICD-10-CM

## 2014-12-21 DIAGNOSIS — Z853 Personal history of malignant neoplasm of breast: Secondary | ICD-10-CM

## 2014-12-21 LAB — CMP (CANCER CENTER ONLY)
ALK PHOS: 65 U/L (ref 26–84)
ALT(SGPT): 17 U/L (ref 10–47)
AST: 22 U/L (ref 11–38)
Albumin: 3.5 g/dL (ref 3.3–5.5)
BILIRUBIN TOTAL: 1.4 mg/dL (ref 0.20–1.60)
BUN, Bld: 23 mg/dL — ABNORMAL HIGH (ref 7–22)
CO2: 25 mEq/L (ref 18–33)
Calcium: 8.9 mg/dL (ref 8.0–10.3)
Chloride: 103 mEq/L (ref 98–108)
Creat: 1.2 mg/dl (ref 0.6–1.2)
GLUCOSE: 121 mg/dL — AB (ref 73–118)
Potassium: 4 mEq/L (ref 3.3–4.7)
Sodium: 141 mEq/L (ref 128–145)
TOTAL PROTEIN: 6.7 g/dL (ref 6.4–8.1)

## 2014-12-21 LAB — CBC WITH DIFFERENTIAL (CANCER CENTER ONLY)
BASO#: 0 10*3/uL (ref 0.0–0.2)
BASO%: 0.8 % (ref 0.0–2.0)
EOS ABS: 0.2 10*3/uL (ref 0.0–0.5)
EOS%: 4 % (ref 0.0–7.0)
HEMATOCRIT: 33.7 % — AB (ref 34.8–46.6)
HGB: 11.1 g/dL — ABNORMAL LOW (ref 11.6–15.9)
LYMPH#: 0.9 10*3/uL (ref 0.9–3.3)
LYMPH%: 16.7 % (ref 14.0–48.0)
MCH: 35.8 pg — ABNORMAL HIGH (ref 26.0–34.0)
MCHC: 32.9 g/dL (ref 32.0–36.0)
MCV: 109 fL — AB (ref 81–101)
MONO#: 0.8 10*3/uL (ref 0.1–0.9)
MONO%: 14.6 % — ABNORMAL HIGH (ref 0.0–13.0)
NEUT%: 63.9 % (ref 39.6–80.0)
NEUTROS ABS: 3.4 10*3/uL (ref 1.5–6.5)
Platelets: 141 10*3/uL — ABNORMAL LOW (ref 145–400)
RBC: 3.1 10*6/uL — ABNORMAL LOW (ref 3.70–5.32)
RDW: 13.8 % (ref 11.1–15.7)
WBC: 5.3 10*3/uL (ref 3.9–10.0)

## 2014-12-21 LAB — FERRITIN CHCC: FERRITIN: 599 ng/mL — AB (ref 9–269)

## 2014-12-21 LAB — IRON AND TIBC CHCC
%SAT: 38 % (ref 21–57)
IRON: 88 ug/dL (ref 41–142)
TIBC: 234 ug/dL — AB (ref 236–444)
UIBC: 146 ug/dL (ref 120–384)

## 2014-12-21 MED ORDER — SODIUM CHLORIDE 0.9 % IV SOLN
INTRAVENOUS | Status: DC
  Administered 2014-12-21: 12:00:00 via INTRAVENOUS

## 2014-12-21 MED ORDER — SODIUM CHLORIDE 0.9 % IJ SOLN
10.0000 mL | INTRAMUSCULAR | Status: DC | PRN
  Administered 2014-12-21: 10 mL via INTRAVENOUS
  Filled 2014-12-21: qty 10

## 2014-12-21 MED ORDER — HEPARIN SOD (PORK) LOCK FLUSH 100 UNIT/ML IV SOLN
500.0000 [IU] | Freq: Once | INTRAVENOUS | Status: AC
Start: 2014-12-21 — End: 2014-12-21
  Administered 2014-12-21: 500 [IU] via INTRAVENOUS
  Filled 2014-12-21: qty 5

## 2014-12-21 MED ORDER — ZOLEDRONIC ACID 4 MG/5ML IV CONC
3.0000 mg | Freq: Once | INTRAVENOUS | Status: AC
  Administered 2014-12-21: 3 mg via INTRAVENOUS
  Filled 2014-12-21: qty 3.75

## 2014-12-21 NOTE — Progress Notes (Signed)
Hematology and Oncology Follow Up Visit  Jacqueline Rivas [id number] [birth year] 81 y.o. 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 is back for followup. She looks pretty good. She really has had no problems with the Revlimid. Her cardiac function is slowly improving.  Her last monoclonal studies showed stability with the myeloma.   Her myeloma is responding nicely. Her monoclonal spike is now 0. 44 g/dL. her Kappa light chain was 7.55 mg/dL. She only has one monoclonal spike now.  She's doing well with Revlimid. She very few toxicities from Revlimid.  She's had no problems with cough. She had no increased shortness of breath. There's been no leg swelling.  Her appetite is okay. She has had no nausea or vomiting  She's had no diarrhea.  There's been no rashes.  She still is not exercising that much. I told her that really exercise is going be very critical for her. Hopefully, with the weather being very nice next week, she will be out more.  She has not come and see the cardiologist. I will have to let him know that she needs to go back to see him. She just keeps forgetting to make an appointment.  Currently, her performance status is ECOG 1.  Medications:  Current outpatient prescriptions:  .  aspirin EC 81 MG tablet, Take 162 mg by mouth daily., Disp: , Rfl:  .  calcium carbonate (OS-CAL) 600 MG TABS tablet, Take 1,200 mg by mouth daily with breakfast., Disp: , Rfl:  .  carvedilol (COREG) 3.125 MG tablet, Take 1 tablet (3.125 mg total) by mouth 2 (two) times daily with a meal. Take along with 6.25mg  (9.375  TOTAL), Disp: 180 tablet, Rfl: 3 .  Coenzyme Q10 (CO Q 10) 100 MG CAPS, Take 100 mg by mouth every morning. , Disp: , Rfl:  .  dexamethasone (DECADRON) 4 MG tablet, Take 5 tablets (20 mg total) by mouth once a week., Disp: 120 tablet, Rfl: 3 .  digoxin (LANOXIN) 0.125 MG tablet, Take 1 tablet (0.125 mg total) by mouth daily., Disp: 30 tablet, Rfl: 3 .  fentaNYL (DURAGESIC) 12 MCG/HR, Place 1 patch (12.5 mcg total) onto the skin every 3 (three) days., Disp: 10 patch, Rfl: 0 .  HYDROmorphone (DILAUDID) 2 MG tablet, Take 2 mg by mouth every 6 (six) hours as needed (pain). , Disp: , Rfl:  .  lansoprazole (PREVACID) 30 MG capsule, Take 30 mg by mouth daily at 12 noon. Pt has not been taking daily., Disp: , Rfl:  .  lenalidomide (REVLIMID) 15 MG capsule, Take 1 capsule (15 mg total) by mouth daily. Take for 21 days. Off for 7 days. [id number], Disp: 21 capsule, Rfl: 0 .  lisinopril (PRINIVIL,ZESTRIL) 2.5 MG tablet, Take 1 tablet (2.5 mg total) by mouth daily., Disp: 30 tablet, Rfl: 3 .  magnesium 30 MG tablet, Take 30 mg by mouth every morning., Disp: , Rfl:  .  omeprazole (PRILOSEC) 20 MG capsule, TAKE 1 CAPSULE BY MOUTH EVERY MORNING, Disp: 30 capsule, Rfl: 0 .  polyethylene  glycol (MIRALAX / GLYCOLAX) packet, Take 17 g by mouth daily., Disp: , Rfl:  .  potassium chloride SA (K-DUR,KLOR-CON) 20 MEQ tablet, Take 1 tablet (20 mEq total) by mouth daily., Disp: 30 tablet, Rfl: 3 .  spironolactone (ALDACTONE) 25 MG tablet, Take 1 tablet (25 mg total) by mouth daily., Disp: 30 tablet, Rfl: 6 .  torsemide (DEMADEX) 20 MG tablet, Take one tab (20mg ) every other day, Disp: 30 tablet, Rfl: 3 No current facility-administered medications for this visit.  Facility-Administered Medications Ordered in Other Visits:  .  0.9 %  sodium chloride infusion, , Intravenous, Continuous, [name], MD, Last Rate: 10 mL/hr at 12/21/14 1207 .  sodium chloride 0.9 % injection 10 mL, 10 mL, Intracatheter, PRN, [name], MD, 10 mL at 04/16/14 1332  Allergies: No Known Allergies  Past Medical History, Surgical history, Social history, and Family History were reviewed and updated.  Review of Systems: As above  Physical Exam:  oral temperature is 97.8 F (36.6 C). Her blood pressure is 137/73 and her pulse is 73.   Elderly, thin white female in no obvious distress. Head and neck exam shows no ocular or oral lesions. There are no palpable cervical or supraclavicular lymph nodes. Lungs are clear. Cardiac exam regular rate and rhythm. She has an occasional extra beat. She has a 1/6 systolic ejection murmur. Abdomen is soft. She has good bowel sounds. There is no fluid wave. There is no palpable liver or spleen tip. Extremities shows no clubbing, cyanosis or edema. She has some trace edema in her lower legs. She has some age-related osteoarthritic changes. Breast exam shows left breast with no masses, edema or erythema. There is no left axillary adenopathy. Right chest wall shows a well-healed mastectomy. No right chest wall nodules are noted. There is no right axillary adenopathy. Neurological exam shows no focal neurological deficits.  Lab Results  Component Value Date   WBC 5.3 12/21/2014   HGB 11.1* 12/21/2014   HCT 33.7* 12/21/2014   MCV 109* 12/21/2014   PLT 141* 12/21/2014     Chemistry      Component Value Date/Time   NA 141 12/21/2014 1109   NA 138 06/22/2014 1035   K 4.0 12/21/2014 1109   K 4.6 06/22/2014 1035   CL 103 12/21/2014 1109   CL 106 06/22/2014 1035   CO2 25 12/21/2014 1109   CO2 23 06/22/2014 1035   BUN 23* 12/21/2014 1109   BUN 37* 06/22/2014 1035   CREATININE 1.2 12/21/2014 1109   CREATININE 1.48* 06/22/2014 1035      Component Value Date/Time   CALCIUM 8.9 12/21/2014 1109   CALCIUM 9.0 06/22/2014 1035   CALCIUM 11.1* 11/13/2012 1526   ALKPHOS 65 12/21/2014 1109   ALKPHOS 78 06/22/2014 1035   AST 22 12/21/2014 1109   AST 13 06/22/2014 1035   ALT 17 12/21/2014  1109   ALT 16 06/22/2014 1035   BILITOT 1.40 12/21/2014 1109   BILITOT 0.4 06/22/2014 1035         Impression and Plan: Jacqueline Rivas is 81 year old white female. She has both IgA kappa myeloma and stage II breast cancer. She continues to look quite good. She's responding. She now only has one monoclonal spike.  We will go ahead and give her Zometa today. Her renal function is looking better.  Her hemoglobin is stable. Hopefully,, this is a sign that the myeloma is improving. She does not need any Aranesp today.  I spent about  30 minutes with her today. She came in with her son. He is a was fun to talk to. [name], MD 3/4/201612:23 PM

## 2014-12-21 NOTE — Patient Instructions (Signed)

## 2014-12-24 ENCOUNTER — Telehealth (HOSPITAL_COMMUNITY): Payer: Self-pay | Admitting: Vascular Surgery

## 2014-12-25 ENCOUNTER — Telehealth: Payer: Self-pay | Admitting: *Deleted

## 2014-12-25 LAB — RETICULOCYTES (CHCC)
ABS Retic: 31.2 10*3/uL (ref 19.0–186.0)
RBC.: 3.12 MIL/uL — AB (ref 3.87–5.11)
RETIC CT PCT: 1 % (ref 0.4–2.3)

## 2014-12-25 LAB — PROTEIN ELECTROPHORESIS, SERUM, WITH REFLEX
ALPHA-1-GLOBULIN: 5.4 % — AB (ref 2.9–4.9)
Albumin ELP: 56.9 % (ref 55.8–66.1)
Alpha-2-Globulin: 13.2 % — ABNORMAL HIGH (ref 7.1–11.8)
BETA 2: 4.6 % (ref 3.2–6.5)
BETA GLOBULIN: 6.2 % (ref 4.7–7.2)
GAMMA GLOBULIN: 13.7 % (ref 11.1–18.8)
M-SPIKE, %: 0.34 g/dL
Total Protein, Serum Electrophoresis: 6.1 g/dL (ref 6.0–8.3)

## 2014-12-25 LAB — KAPPA/LAMBDA LIGHT CHAINS
KAPPA LAMBDA RATIO: 3.16 — AB (ref 0.26–1.65)
Kappa free light chain: 4.43 mg/dL — ABNORMAL HIGH (ref 0.33–1.94)
Lambda Free Lght Chn: 1.4 mg/dL (ref 0.57–2.63)

## 2014-12-25 LAB — IGG, IGA, IGM
IgA: 598 mg/dL — ABNORMAL HIGH (ref 69–380)
IgG (Immunoglobin G), Serum: 463 mg/dL — ABNORMAL LOW (ref 690–1700)
IgM, Serum: 51 mg/dL — ABNORMAL LOW (ref 52–322)

## 2014-12-25 LAB — IFE INTERPRETATION

## 2014-12-25 NOTE — Telephone Encounter (Addendum)
Patient aware  ----- Message from [name], MD sent at 12/25/2014  1:15 PM EST ----- Call and let her know that the myeloma is continuing to respond. Her myeloma number is now down to 0.34. It was 0.44. Thanks

## 2014-12-25 NOTE — Telephone Encounter (Signed)
-----   Message from [name], MD sent at 12/25/2014  1:15 PM EST ----- Call and let her know that the myeloma is continuing to respond. Her myeloma number is now down to 0.34. It was 0.44. Thanks

## 2014-12-26 ENCOUNTER — Telehealth (HOSPITAL_COMMUNITY): Payer: Self-pay | Admitting: Vascular Surgery

## 2014-12-27 ENCOUNTER — Encounter (HOSPITAL_COMMUNITY): Payer: Self-pay | Admitting: Vascular Surgery

## 2015-01-21 ENCOUNTER — Other Ambulatory Visit (HOSPITAL_COMMUNITY): Payer: Self-pay | Admitting: Cardiology

## 2015-01-21 DIAGNOSIS — I5022 Chronic systolic (congestive) heart failure: Secondary | ICD-10-CM

## 2015-01-22 ENCOUNTER — Ambulatory Visit (HOSPITAL_BASED_OUTPATIENT_CLINIC_OR_DEPARTMENT_OTHER): Payer: Medicare Other

## 2015-01-22 ENCOUNTER — Ambulatory Visit (HOSPITAL_BASED_OUTPATIENT_CLINIC_OR_DEPARTMENT_OTHER): Payer: Medicare Other | Admitting: Lab

## 2015-01-22 DIAGNOSIS — C9000 Multiple myeloma not having achieved remission: Secondary | ICD-10-CM

## 2015-01-22 LAB — CMP (CANCER CENTER ONLY)
ALT: 19 U/L (ref 10–47)
AST: 19 U/L (ref 11–38)
Albumin: 3.2 g/dL — ABNORMAL LOW (ref 3.3–5.5)
Alkaline Phosphatase: 57 U/L (ref 26–84)
BUN, Bld: 26 mg/dL — ABNORMAL HIGH (ref 7–22)
CHLORIDE: 102 meq/L (ref 98–108)
CO2: 25 meq/L (ref 18–33)
CREATININE: 1.5 mg/dL — AB (ref 0.6–1.2)
Calcium: 8.6 mg/dL (ref 8.0–10.3)
Glucose, Bld: 90 mg/dL (ref 73–118)
Potassium: 3.8 mEq/L (ref 3.3–4.7)
Sodium: 141 mEq/L (ref 128–145)
TOTAL PROTEIN: 6.4 g/dL (ref 6.4–8.1)
Total Bilirubin: 1.1 mg/dl (ref 0.20–1.60)

## 2015-01-22 LAB — CBC WITH DIFFERENTIAL (CANCER CENTER ONLY)
BASO#: 0.1 10*3/uL (ref 0.0–0.2)
BASO%: 1.4 % (ref 0.0–2.0)
EOS ABS: 0.3 10*3/uL (ref 0.0–0.5)
EOS%: 4.9 % (ref 0.0–7.0)
HCT: 32.4 % — ABNORMAL LOW (ref 34.8–46.6)
HGB: 10.5 g/dL — ABNORMAL LOW (ref 11.6–15.9)
LYMPH#: 0.8 10*3/uL — ABNORMAL LOW (ref 0.9–3.3)
LYMPH%: 14.1 % (ref 14.0–48.0)
MCH: 36.2 pg — ABNORMAL HIGH (ref 26.0–34.0)
MCHC: 32.4 g/dL (ref 32.0–36.0)
MCV: 112 fL — ABNORMAL HIGH (ref 81–101)
MONO#: 0.5 10*3/uL (ref 0.1–0.9)
MONO%: 8.3 % (ref 0.0–13.0)
NEUT%: 71.3 % (ref 39.6–80.0)
NEUTROS ABS: 4.2 10*3/uL (ref 1.5–6.5)
PLATELETS: 191 10*3/uL (ref 145–400)
RBC: 2.9 10*6/uL — AB (ref 3.70–5.32)
RDW: 14.6 % (ref 11.1–15.7)
WBC: 5.9 10*3/uL (ref 3.9–10.0)

## 2015-01-22 MED ORDER — ZOLEDRONIC ACID 4 MG/5ML IV CONC
3.0000 mg | Freq: Once | INTRAVENOUS | Status: AC
  Administered 2015-01-22: 3 mg via INTRAVENOUS
  Filled 2015-01-22: qty 3.75

## 2015-01-22 NOTE — Patient Instructions (Signed)

## 2015-01-23 ENCOUNTER — Ambulatory Visit (HOSPITAL_COMMUNITY)
Admission: RE | Admit: 2015-01-23 | Discharge: 2015-01-23 | Disposition: A | Discharge status: Home / Self Care | Payer: Medicare Other | Source: Ambulatory Visit | Attending: Cardiology | Admitting: Cardiology

## 2015-01-23 ENCOUNTER — Ambulatory Visit (HOSPITAL_BASED_OUTPATIENT_CLINIC_OR_DEPARTMENT_OTHER)
Admission: RE | Admit: 2015-01-23 | Discharge: 2015-01-23 | Disposition: A | Discharge status: Home / Self Care | Payer: Medicare Other | Source: Ambulatory Visit | Attending: Cardiology | Admitting: Cardiology

## 2015-01-23 VITALS — BP 102/78 | HR 63 | Wt 143.8 lb

## 2015-01-23 DIAGNOSIS — I5022 Chronic systolic (congestive) heart failure: Secondary | ICD-10-CM | POA: Diagnosis not present

## 2015-01-23 DIAGNOSIS — I509 Heart failure, unspecified: Secondary | ICD-10-CM | POA: Diagnosis not present

## 2015-01-23 MED ORDER — CARVEDILOL 3.125 MG PO TABS: 3.1250 mg | ORAL_TABLET | Freq: Two times a day (BID) | ORAL | Status: DC

## 2015-01-23 NOTE — Patient Instructions (Signed)
STOP Digoxin DECREASE Carvedilol to 3.125 mg, one tab twice a day  Your physician recommends that you schedule a follow-up appointment in: 6 months

## 2015-01-23 NOTE — Progress Notes (Signed)
Patient ID: Jacqueline Rivas, female   DOB: 1931/12/22, 81 y.o.   MRN: [id number] Oncologist: Dr Marin Olp  HPI: [name] is a 81 y.o. female with a history of multiple myeloma (diagnosed 11/2012 - treated with XRT/chemo), breast CA, CKD and recent diagnosis of systolic HF, likely due to Herceptin toxicity.   Apparently her EF was normal in September 2014. She was on Herceptin for breast CA with Dr. Marin Olp for 1 year, starting in June 2014. In February, she had an echo which showed an EF of 20-25%. The Herceptin was discontinued. She was admitted last week to [facility] for volume overload. She was diuresed. Discharged to [facility] on 01/22/14. Weight ~127. Discharged on lasix 54m daily. In hospital diuresis was limited by low BP.   She was admitted later in in 4/15 from clinic with volume overload.  She was started on milrinone and Lasix gtt with good diuresis.  She was weaned off milrinone.   She returns for follow up. Last visit carvedilol was increased to 9.375 mg twice a day but she is only taking once a day. Not weighing at home. Mild dyspnea with exertion. Not walking much. Only walking when she is at the grocery store.   She had echo 01/17/14: LVEF 20% RV mildly dilated. Moderate TR.  Echo 7/15: LVEF 35%  Echo 4/16: LVEF 55%, normal RV size and systolic function, mild MR, mild AI  Labs (4/15): Na 137, K 4.2 => 3.9, Cr 1.27 => 1.35 Labs (03/16/14): K 4.1 Creatinine 1.7, digoxin 0.8 Labs (6/15): K 4.5, creatinine 1.9, HCT 31.1 Labs 04/30/14: K 4.5 Cr 1.85 dig 0.5 Labs 05/21/14: K 4.7 Cr 1.8  Labs 07/19/14: K 3.7 Cr 1.5 Labs 01/22/2015: Creatinine 1.5 K 3.8  Review of Systems: All systems reviewed and negative except as per HPI.   Past Medical History  Diagnosis Date  . Diverticulosis   . Hiatal hernia   . Hyperlipidemia   . Hypertension   . Breast mass   . Multiple myeloma   . Breast cancer   . Arthritis   . S/P radiation therapy 12/15/12 - 12/21/12    T6 - L1 / 18.75 Gy / 5 Fractions  . Bilateral  atelectasis   . Systolic heart failure     EF 20% 11/2013  . Protein-calorie malnutrition, severe 01/20/2014  . Chronic kidney disease     stage 3    Current Outpatient Prescriptions  Medication Sig Dispense Refill  . aspirin EC 81 MG tablet Take 162 mg by mouth daily.    . calcium carbonate (OS-CAL) 600 MG TABS tablet Take 1,200 mg by mouth daily with breakfast.    . carvedilol (COREG) 3.125 MG tablet Take 1 tablet (3.125 mg total) by mouth 2 (two) times daily with a meal. Take along with 6.265m(9.375 TOTAL) 180 tablet 3  . Coenzyme Q10 (CO Q 10) 100 MG CAPS Take 100 mg by mouth every morning.     . Marland Kitchenexamethasone (DECADRON) 4 MG tablet Take 5 tablets (20 mg total) by mouth once a week. 120 tablet 3  . digoxin (LANOXIN) 0.125 MG tablet Take 1 tablet (0.125 mg total) by mouth daily. 30 tablet 3  . fentaNYL (DURAGESIC) 12 MCG/HR Place 1 patch (12.5 mcg total) onto the skin every 3 (three) days. 10 patch 0  . HYDROmorphone (DILAUDID) 2 MG tablet Take 2 mg by mouth every 6 (six) hours as needed (pain).     . Marland Kitchenansoprazole (PREVACID) 30 MG capsule Take 30 mg by mouth daily  at 12 noon. Pt has not been taking daily.    Marland Kitchen lenalidomide (REVLIMID) 15 MG capsule Take 1 capsule (15 mg total) by mouth daily. Take for 21 days. Off for 7 days. [id number] 21 capsule 0  . lisinopril (PRINIVIL,ZESTRIL) 2.5 MG tablet Take 1 tablet (2.5 mg total) by mouth daily. 30 tablet 3  . magnesium 30 MG tablet Take 30 mg by mouth every morning.    Marland Kitchen omeprazole (PRILOSEC) 20 MG capsule TAKE 1 CAPSULE BY MOUTH EVERY MORNING 30 capsule 0  . polyethylene glycol (MIRALAX / GLYCOLAX) packet Take 17 g by mouth daily.    . potassium chloride SA (K-DUR,KLOR-CON) 20 MEQ tablet Take 1 tablet (20 mEq total) by mouth daily. 30 tablet 3  . spironolactone (ALDACTONE) 25 MG tablet Take 1 tablet (25 mg total) by mouth daily. 30 tablet 6  . torsemide (DEMADEX) 20 MG tablet Take one tab (38m) every other day 30 tablet 3   No current  facility-administered medications for this encounter.   Facility-Administered Medications Ordered in Other Encounters  Medication Dose Route Frequency Provider Last Rate Last Dose  . sodium chloride 0.9 % injection 10 mL  10 mL Intracatheter PRN PVolanda Napoleon MD   10 mL at 04/16/14 1332    No Known Allergies  PHYSICAL EXAM: Filed Vitals:   01/23/15 1524  BP: 102/78  Pulse: 63  Weight: 143 lb 12.8 oz (65.227 kg)  SpO2: 96%    General:  Elderly. No dyspnea  Son present  HEENT: normal Neck: supple. No JVD. Carotids 2+ bilat; no bruits. No lymphadenopathy or thyromegaly appreciated. Cor: PMI laterally displaced. Regular rate & rhythm. No S3. 2/6 TR murmur. Lungs: clear Abdomen: soft, nontender, non distended. No hepatosplenomegaly. No bruits or masses. Good bowel sounds. Extremities: no cyanosis, clubbing, rash.  No edema.   Neuro: alert & oriented x 3, cranial nerves grossly intact. moves all 4 extremities w/o difficulty. Affect pleasant.  ASSESSMENT & PLAN: 1. Chronic systolic HF:  Suspect nonischemic cardiomyopathy due to Herceptin use. EF has improved to 55%.  NYHA class II symptoms, improved.  She has not had cardiac catheterization but would defer with CKD and good alternative explanation.   - Volume status looks good. Continue torsemide as needed.  - Continue spironolactone 25 mg daily.   - Continue lisinopril 2.5 qhs - Cut back Coreg to 3.125 mg twice a day. Stop digoxin.  2. CKD: Creatinine has stabilized.  3. Breast CA: 4. Multiple myeloma: Per Dr. EMarin Olp Continue revlimid  5. DNR/DNI  Follow up in 6 months.   CLEGG,AMY NP-C  01/23/2015   Patient seen with NP, agree with the above note. I reviewed today's echo, EF has improved to 55%.  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  Coreg to 3.125 mg bid and stop digoxin.  Other meds will continue unchanged.  Torsemide will be prn.  Followup 6 months.   DLoralie Champagne4/04/2015

## 2015-01-23 NOTE — Progress Notes (Signed)
  Echocardiogram 2D Echocardiogram has been performed.  Jacqueline Rivas M 01/23/2015, 2:45 PM

## 2015-01-24 LAB — PROTEIN ELECTROPHORESIS, SERUM, WITH REFLEX
ALPHA-1-GLOBULIN: 0.3 g/dL (ref 0.2–0.3)
ALPHA-2-GLOBULIN: 0.8 g/dL (ref 0.5–0.9)
Abnormal Protein Band1: 0.4 g/dL
Albumin ELP: 3.5 g/dL — ABNORMAL LOW (ref 3.8–4.8)
BETA 2: 0.3 g/dL (ref 0.2–0.5)
BETA GLOBULIN: 0.4 g/dL (ref 0.4–0.6)
GAMMA GLOBULIN: 0.8 g/dL (ref 0.8–1.7)
Total Protein, Serum Electrophoresis: 6 g/dL — ABNORMAL LOW (ref 6.1–8.1)

## 2015-01-24 LAB — IGG, IGA, IGM
IgA: 469 mg/dL — ABNORMAL HIGH (ref 69–380)
IgG (Immunoglobin G), Serum: 409 mg/dL — ABNORMAL LOW (ref 690–1700)
IgM, Serum: 47 mg/dL — ABNORMAL LOW (ref 52–322)

## 2015-01-24 LAB — KAPPA/LAMBDA LIGHT CHAINS
KAPPA FREE LGHT CHN: 8.97 mg/dL — AB (ref 0.33–1.94)
KAPPA LAMBDA RATIO: 5.68 — AB (ref 0.26–1.65)
Lambda Free Lght Chn: 1.58 mg/dL (ref 0.57–2.63)

## 2015-01-24 LAB — IFE INTERPRETATION

## 2015-02-07 ENCOUNTER — Other Ambulatory Visit: Payer: Self-pay

## 2015-02-07 DIAGNOSIS — C9000 Multiple myeloma not having achieved remission: Secondary | ICD-10-CM

## 2015-02-07 MED ORDER — LENALIDOMIDE 15 MG PO CAPS: 15.0000 mg | ORAL_CAPSULE | Freq: Every day | ORAL | Status: DC

## 2015-02-18 ENCOUNTER — Ambulatory Visit (HOSPITAL_BASED_OUTPATIENT_CLINIC_OR_DEPARTMENT_OTHER): Payer: Medicare Other

## 2015-02-18 ENCOUNTER — Other Ambulatory Visit (HOSPITAL_COMMUNITY): Payer: Self-pay | Admitting: *Deleted

## 2015-02-18 ENCOUNTER — Encounter: Payer: Self-pay | Admitting: Hematology & Oncology

## 2015-02-18 ENCOUNTER — Ambulatory Visit (HOSPITAL_BASED_OUTPATIENT_CLINIC_OR_DEPARTMENT_OTHER): Payer: Medicare Other | Admitting: Hematology & Oncology

## 2015-02-18 VITALS — BP 137/65 | HR 79 | Temp 98.0°F | Resp 14 | Ht 65.0 in | Wt 140.0 lb

## 2015-02-18 DIAGNOSIS — I5022 Chronic systolic (congestive) heart failure: Secondary | ICD-10-CM

## 2015-02-18 DIAGNOSIS — C50911 Malignant neoplasm of unspecified site of right female breast: Secondary | ICD-10-CM

## 2015-02-18 DIAGNOSIS — N183 Chronic kidney disease, stage 3 unspecified: Secondary | ICD-10-CM

## 2015-02-18 DIAGNOSIS — D630 Anemia in neoplastic disease: Secondary | ICD-10-CM

## 2015-02-18 DIAGNOSIS — C9000 Multiple myeloma not having achieved remission: Secondary | ICD-10-CM

## 2015-02-18 DIAGNOSIS — Z853 Personal history of malignant neoplasm of breast: Secondary | ICD-10-CM

## 2015-02-18 DIAGNOSIS — I5043 Acute on chronic combined systolic (congestive) and diastolic (congestive) heart failure: Secondary | ICD-10-CM

## 2015-02-18 DIAGNOSIS — C773 Secondary and unspecified malignant neoplasm of axilla and upper limb lymph nodes: Secondary | ICD-10-CM

## 2015-02-18 LAB — CMP (CANCER CENTER ONLY)
ALBUMIN: 3.4 g/dL (ref 3.3–5.5)
ALT(SGPT): 21 U/L (ref 10–47)
AST: 22 U/L (ref 11–38)
Alkaline Phosphatase: 77 U/L (ref 26–84)
BILIRUBIN TOTAL: 0.8 mg/dL (ref 0.20–1.60)
BUN, Bld: 20 mg/dL (ref 7–22)
CALCIUM: 9.6 mg/dL (ref 8.0–10.3)
CO2: 26 mEq/L (ref 18–33)
CREATININE: 1.4 mg/dL — AB (ref 0.6–1.2)
Chloride: 106 mEq/L (ref 98–108)
Glucose, Bld: 143 mg/dL — ABNORMAL HIGH (ref 73–118)
Potassium: 4 mEq/L (ref 3.3–4.7)
Sodium: 143 mEq/L (ref 128–145)
Total Protein: 7.2 g/dL (ref 6.4–8.1)

## 2015-02-18 LAB — CBC WITH DIFFERENTIAL (CANCER CENTER ONLY)
BASO#: 0 10*3/uL (ref 0.0–0.2)
BASO%: 0.3 % (ref 0.0–2.0)
EOS ABS: 0 10*3/uL (ref 0.0–0.5)
EOS%: 0.2 % (ref 0.0–7.0)
HCT: 34.6 % — ABNORMAL LOW (ref 34.8–46.6)
HEMOGLOBIN: 11.5 g/dL — AB (ref 11.6–15.9)
LYMPH#: 0.5 10*3/uL — ABNORMAL LOW (ref 0.9–3.3)
LYMPH%: 7.3 % — ABNORMAL LOW (ref 14.0–48.0)
MCH: 37.1 pg — ABNORMAL HIGH (ref 26.0–34.0)
MCHC: 33.2 g/dL (ref 32.0–36.0)
MCV: 112 fL — AB (ref 81–101)
MONO#: 0.3 10*3/uL (ref 0.1–0.9)
MONO%: 5 % (ref 0.0–13.0)
NEUT#: 5.5 10*3/uL (ref 1.5–6.5)
NEUT%: 87.2 % — AB (ref 39.6–80.0)
Platelets: 237 10*3/uL (ref 145–400)
RBC: 3.1 10*6/uL — ABNORMAL LOW (ref 3.70–5.32)
RDW: 14.1 % (ref 11.1–15.7)
WBC: 6.3 10*3/uL (ref 3.9–10.0)

## 2015-02-18 MED ORDER — SODIUM CHLORIDE 0.9 % IJ SOLN
10.0000 mL | INTRAMUSCULAR | Status: DC | PRN
  Administered 2015-02-18: 10 mL via INTRAVENOUS
  Filled 2015-02-18: qty 10

## 2015-02-18 MED ORDER — TORSEMIDE 20 MG PO TABS: ORAL_TABLET | ORAL | Status: DC

## 2015-02-18 MED ORDER — SODIUM CHLORIDE 0.9 % IV SOLN
Freq: Once | INTRAVENOUS | Status: AC
  Administered 2015-02-18: 14:00:00 via INTRAVENOUS

## 2015-02-18 MED ORDER — HEPARIN SOD (PORK) LOCK FLUSH 100 UNIT/ML IV SOLN
500.0000 [IU] | Freq: Once | INTRAVENOUS | Status: AC
  Administered 2015-02-18: 500 [IU] via INTRAVENOUS
  Filled 2015-02-18: qty 5

## 2015-02-18 MED ORDER — ZOLEDRONIC ACID 4 MG/5ML IV CONC
3.0000 mg | Freq: Once | INTRAVENOUS | Status: AC
  Administered 2015-02-18: 3 mg via INTRAVENOUS
  Filled 2015-02-18: qty 3.75

## 2015-02-18 NOTE — Patient Instructions (Signed)

## 2015-02-18 NOTE — Progress Notes (Signed)
Hematology and Oncology Follow Up Visit  Jacqueline Rivas [id number] August 02, 1932 81 y.o. 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 is back for followup. She looks real good. She has been down in [place] for a couple weeks. She actually will be moving down there to a and assisted-living facility. Her 2 sons live down there. She wants to be closer to her grandkids. I know that she will get very good care down there. She wants to make sure that she can still come back to see [place].  She really has had no problems with the Revlimid. Her cardiac function is slowly improving. An echocardiogram done in early April showed an ejection fraction of 55% which is back to normal.  Her last monoclonal studies showed stability with the myeloma.   Her myeloma is responding nicely. Her monoclonal spike is now 0. 40 g/dL. Her Kappa light chain was 8.97 mg/dL. She only has one monoclonal spike now. Her IgA level is 469 mg/dL.  She's doing well with Revlimid. She very few toxicities from Revlimid.  She's had no problems with cough. She had no increased shortness of breath. There's been no leg swelling.  Her appetite is okay. She has had no nausea or vomiting.  She's had no diarrhea.  There's been no rashes.  She still is not exercising that much. I told her that really exercise is going be very critical for her. Hopefully, with the weather being very nice next week, she will be out more.   Currently, her performance status is ECOG 1.  Medications:  Current outpatient prescriptions:  .  calcium carbonate (OS-CAL) 600 MG TABS  tablet, Take 1,200 mg by mouth daily with breakfast., Disp: , Rfl:  .  carvedilol (COREG) 3.125 MG tablet, Take 1 tablet (3.125 mg total) by mouth 2 (two) times daily with a meal., Disp: 180 tablet, Rfl: 3 .  Coenzyme Q10 (CO Q 10) 100 MG CAPS, Take 100 mg by mouth every morning. , Disp: , Rfl:  .  dexamethasone (DECADRON) 4 MG tablet, Take 5 tablets (20 mg total) by mouth once a week., Disp: 120 tablet, Rfl: 3 .  lansoprazole (PREVACID) 30 MG capsule, Take 30 mg by mouth daily at 12 noon. Pt has not been taking daily., Disp: , Rfl:  .  lenalidomide (REVLIMID) 15 MG capsule, Take 1 capsule (15 mg total) by mouth daily. Take for 21 days. Off for 7 days. [id number], Disp: 21 capsule, Rfl: 0 .  lisinopril (PRINIVIL,ZESTRIL) 2.5 MG tablet, Take 1 tablet (2.5 mg total) by mouth daily., Disp: 30 tablet, Rfl: 3 .  magnesium 30 MG tablet, Take 30 mg by mouth every morning., Disp: , Rfl:  .  omeprazole (PRILOSEC) 20 MG capsule, TAKE 1 CAPSULE BY MOUTH EVERY MORNING, Disp: 30 capsule, Rfl: 0 .  polyethylene glycol (MIRALAX / GLYCOLAX) packet, Take 17 g by mouth daily., Disp: , Rfl:  .  potassium chloride SA (K-DUR,KLOR-CON) 20 MEQ tablet, Take 1 tablet (20 mEq total) by mouth daily., Disp: 30 tablet, Rfl: 3 .  spironolactone (ALDACTONE) 25 MG tablet, Take 1 tablet (25 mg total) by mouth daily., Disp: 30 tablet, Rfl: 6 .  aspirin EC 81 MG tablet, Take 162 mg by mouth daily., Disp: , Rfl:  .  torsemide (DEMADEX) 20 MG tablet, Take one tab (20mg ) every other day, Disp: 30 tablet, Rfl: 3 No current facility-administered medications for this visit.  Facility-Administered Medications Ordered in Other Visits:  .  sodium chloride 0.9 % injection 10 mL, 10 mL, Intracatheter, PRN, [name], MD, 10 mL at 04/16/14 1332  Allergies: No Known Allergies  Past Medical History, Surgical history, Social history, and Family History were reviewed and updated.  Review of Systems: As above  Physical Exam:  height  is 5\' 5"  (1.651 m) and weight is 140 lb (63.504 kg). Her oral temperature is 98 F (36.7 C). Her blood pressure is 137/65 and her pulse is 79. Her respiration is 14.   Elderly, thin white female in no obvious distress. Head and neck exam shows no ocular or oral lesions. There are no palpable cervical or supraclavicular lymph nodes. Lungs are clear. Cardiac exam regular rate and rhythm. She has an occasional extra beat. She has a 1/6 systolic ejection murmur. Abdomen is soft. She has good bowel sounds. There is no fluid wave. There is no palpable liver or spleen tip. Extremities shows no clubbing, cyanosis or edema. She has some trace edema in her lower legs. She has some age-related osteoarthritic changes. Breast exam shows left breast with no masses, edema or erythema. There is no left axillary adenopathy. Right chest wall shows a well-healed mastectomy. No right chest wall nodules are noted. There is no right axillary adenopathy. Neurological exam shows no focal neurological deficits.  Lab Results  Component Value Date   WBC 6.3 02/18/2015   HGB 11.5* 02/18/2015   HCT 34.6* 02/18/2015   MCV 112* 02/18/2015   PLT 237 02/18/2015     Chemistry      Component Value Date/Time   NA 143 02/18/2015 1213   NA 138 06/22/2014 1035   K 4.0 02/18/2015 1213   K 4.6 06/22/2014 1035   CL 106 02/18/2015 1213   CL 106 06/22/2014 1035   CO2 26 02/18/2015 1213   CO2 23 06/22/2014 1035   BUN 20 02/18/2015 1213   BUN 37* 06/22/2014 1035   CREATININE 1.4* 02/18/2015 1213   CREATININE 1.48* 06/22/2014 1035      Component Value Date/Time   CALCIUM 9.6 02/18/2015 1213   CALCIUM 9.0 06/22/2014 1035   CALCIUM 11.1* 11/13/2012 1526   ALKPHOS 77 02/18/2015 1213   ALKPHOS 78 06/22/2014 1035   AST 22 02/18/2015 1213   AST 13 06/22/2014 1035   ALT 21 02/18/2015 1213   ALT 16 06/22/2014 1035   BILITOT 0.80 02/18/2015 1213   BILITOT 0.4 06/22/2014 1035         Impression and Plan: Jacqueline Rivas is  81 year old white female. She has both IgA kappa myeloma and stage II breast cancer. She continues to look quite good. She's responding. She now only has one monoclonal spike.  We will go ahead and give her Zometa today. Her renal function is stable. Her hemoglobin is looking better so she will not need any Aranesp.  I don't see any issues with respect to the breast cancer. This, we have to watch closely. She certainly is at risk for recurrence of this.  She's going to be moving down to [place]. She will be moving down there  in August. Her 2 sons live down there area and she wants come back to see [place] and see her cardiologist. Her cardiologist has a working [place] hard on her. I'm glad that her cardiac function is back to normal.  I spent about 30 minutes with her today. She came in with her son. He is fun to talk to. [name], MD 5/2/20165:56 PM

## 2015-02-19 ENCOUNTER — Other Ambulatory Visit (HOSPITAL_COMMUNITY): Payer: Self-pay | Admitting: *Deleted

## 2015-02-20 LAB — PROTEIN ELECTROPHORESIS, SERUM, WITH REFLEX
ALPHA-1-GLOBULIN: 0.3 g/dL (ref 0.2–0.3)
ALPHA-2-GLOBULIN: 0.8 g/dL (ref 0.5–0.9)
Abnormal Protein Band1: 0.4 g/dL
Albumin ELP: 3.7 g/dL — ABNORMAL LOW (ref 3.8–4.8)
Beta 2: 0.3 g/dL (ref 0.2–0.5)
Beta Globulin: 0.4 g/dL (ref 0.4–0.6)
Gamma Globulin: 0.9 g/dL (ref 0.8–1.7)
TOTAL PROTEIN, SERUM ELECTROPHOR: 6.4 g/dL (ref 6.1–8.1)

## 2015-02-20 LAB — IGG, IGA, IGM
IGG (IMMUNOGLOBIN G), SERUM: 517 mg/dL — AB (ref 690–1700)
IgA: 641 mg/dL — ABNORMAL HIGH (ref 69–380)
IgM, Serum: 51 mg/dL — ABNORMAL LOW (ref 52–322)

## 2015-02-20 LAB — KAPPA/LAMBDA LIGHT CHAINS
KAPPA FREE LGHT CHN: 9.89 mg/dL — AB (ref 0.33–1.94)
Kappa:Lambda Ratio: 6.77 — ABNORMAL HIGH (ref 0.26–1.65)
Lambda Free Lght Chn: 1.46 mg/dL (ref 0.57–2.63)

## 2015-02-20 LAB — IFE INTERPRETATION

## 2015-02-21 ENCOUNTER — Telehealth: Payer: Self-pay | Admitting: Nurse Practitioner

## 2015-02-21 NOTE — Telephone Encounter (Addendum)
Pt verbalized understanding and appreciation. ----- Message from [name], MD sent at 02/20/2015  5:23 PM EDT ----- Call - myeloma is stable!!  Jacqueline Rivas

## 2015-03-06 ENCOUNTER — Other Ambulatory Visit: Payer: Self-pay

## 2015-03-06 DIAGNOSIS — C9000 Multiple myeloma not having achieved remission: Secondary | ICD-10-CM

## 2015-03-06 MED ORDER — LENALIDOMIDE 15 MG PO CAPS: 15.0000 mg | ORAL_CAPSULE | Freq: Every day | ORAL | Status: DC

## 2015-03-28 NOTE — Telephone Encounter (Signed)
Open in error

## 2015-04-01 ENCOUNTER — Encounter: Payer: Self-pay | Admitting: Hematology & Oncology

## 2015-04-01 ENCOUNTER — Ambulatory Visit (HOSPITAL_BASED_OUTPATIENT_CLINIC_OR_DEPARTMENT_OTHER): Payer: Medicare Other | Admitting: Hematology & Oncology

## 2015-04-01 ENCOUNTER — Ambulatory Visit (HOSPITAL_BASED_OUTPATIENT_CLINIC_OR_DEPARTMENT_OTHER): Payer: Medicare Other

## 2015-04-01 VITALS — BP 153/62 | HR 75 | Temp 98.2°F | Resp 16 | Wt 138.0 lb

## 2015-04-01 DIAGNOSIS — C50911 Malignant neoplasm of unspecified site of right female breast: Secondary | ICD-10-CM | POA: Diagnosis not present

## 2015-04-01 DIAGNOSIS — C773 Secondary and unspecified malignant neoplasm of axilla and upper limb lymph nodes: Secondary | ICD-10-CM

## 2015-04-01 DIAGNOSIS — D500 Iron deficiency anemia secondary to blood loss (chronic): Secondary | ICD-10-CM

## 2015-04-01 DIAGNOSIS — C9000 Multiple myeloma not having achieved remission: Secondary | ICD-10-CM

## 2015-04-01 LAB — CMP (CANCER CENTER ONLY)
ALK PHOS: 69 U/L (ref 26–84)
ALT: 28 U/L (ref 10–47)
AST: 19 U/L (ref 11–38)
Albumin: 3.5 g/dL (ref 3.3–5.5)
BILIRUBIN TOTAL: 1.2 mg/dL (ref 0.20–1.60)
BUN, Bld: 18 mg/dL (ref 7–22)
CO2: 25 mEq/L (ref 18–33)
Calcium: 9 mg/dL (ref 8.0–10.3)
Chloride: 102 mEq/L (ref 98–108)
Creat: 1.4 mg/dl — ABNORMAL HIGH (ref 0.6–1.2)
Glucose, Bld: 117 mg/dL (ref 73–118)
Potassium: 3.7 mEq/L (ref 3.3–4.7)
Sodium: 135 mEq/L (ref 128–145)
Total Protein: 6.7 g/dL (ref 6.4–8.1)

## 2015-04-01 LAB — CBC WITH DIFFERENTIAL (CANCER CENTER ONLY)
BASO#: 0 10*3/uL (ref 0.0–0.2)
BASO%: 0.7 % (ref 0.0–2.0)
EOS%: 4.7 % (ref 0.0–7.0)
Eosinophils Absolute: 0.2 10*3/uL (ref 0.0–0.5)
HCT: 35.3 % (ref 34.8–46.6)
HGB: 11.9 g/dL (ref 11.6–15.9)
LYMPH#: 0.9 10*3/uL (ref 0.9–3.3)
LYMPH%: 21.9 % (ref 14.0–48.0)
MCH: 37.2 pg — AB (ref 26.0–34.0)
MCHC: 33.7 g/dL (ref 32.0–36.0)
MCV: 110 fL — ABNORMAL HIGH (ref 81–101)
MONO#: 0.7 10*3/uL (ref 0.1–0.9)
MONO%: 18.4 % — ABNORMAL HIGH (ref 0.0–13.0)
NEUT#: 2.2 10*3/uL (ref 1.5–6.5)
NEUT%: 54.3 % (ref 39.6–80.0)
Platelets: 130 10*3/uL — ABNORMAL LOW (ref 145–400)
RBC: 3.2 10*6/uL — ABNORMAL LOW (ref 3.70–5.32)
RDW: 13.3 % (ref 11.1–15.7)
WBC: 4 10*3/uL (ref 3.9–10.0)

## 2015-04-01 MED ORDER — SODIUM CHLORIDE 0.9 % IV SOLN
INTRAVENOUS | Status: DC
  Administered 2015-04-01: 13:00:00 via INTRAVENOUS

## 2015-04-01 MED ORDER — HEPARIN SOD (PORK) LOCK FLUSH 100 UNIT/ML IV SOLN
500.0000 [IU] | Freq: Once | INTRAVENOUS | Status: AC
Start: 2015-04-01 — End: 2015-04-01
  Administered 2015-04-01: 500 [IU] via INTRAVENOUS
  Filled 2015-04-01: qty 5

## 2015-04-01 MED ORDER — ZOLEDRONIC ACID 4 MG/5ML IV CONC
3.0000 mg | Freq: Once | INTRAVENOUS | Status: AC
  Administered 2015-04-01: 3 mg via INTRAVENOUS
  Filled 2015-04-01: qty 3.75

## 2015-04-01 MED ORDER — SODIUM CHLORIDE 0.9 % IJ SOLN
10.0000 mL | INTRAMUSCULAR | Status: DC | PRN
  Administered 2015-04-01: 10 mL via INTRAVENOUS
  Filled 2015-04-01: qty 10

## 2015-04-01 NOTE — Patient Instructions (Signed)

## 2015-04-01 NOTE — Progress Notes (Signed)
Hematology and Oncology Follow Up Visit  Jacqueline Rivas [id number] [birth year] 81 y.o. 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  Jacqueline Rivas is back for followup. She looks real good. She has been down in [place] for a couple weeks. She actually will be moving down there to an assisted-living facility. Her 2 sons live down there. She wants to be closer to her grandkids. I know that she will get very good care down there. She wants to make sure that she can still come back to see [place].  She really has had no problems with the Revlimid. Her cardiac function is slowly improving. An echocardiogram done in early April showed an ejection fraction of 55% which is back to normal.  Her last monoclonal studies showed stability with the myeloma.   Her myeloma is responding nicely. Her monoclonal spike is now 0. 40 g/dL. Her Kappa light chain was 9.89 mg/dL. She only has one monoclonal spike now. Her IgA level is 641 mg/dL.  She's doing well with Revlimid. She very few toxicities from Revlimid.  She's had no problems with cough. She had no increased shortness of breath. There's been no leg swelling.  Her appetite is okay. She has had no nausea or vomiting.  She's had no diarrhea.  There's been no rashes.  She still is not exercising that much. I told her that regular exercise is going be very critical for her. Hopefully, with the summer weather, she will do a little bit more outside.  Currently, her performance status is ECOG 1.  Medications:  Current outpatient prescriptions:  .  aspirin EC 81 MG tablet, Take 162 mg by mouth  daily., Disp: , Rfl:  .  calcium carbonate (OS-CAL) 600 MG TABS tablet, Take 1,200 mg by mouth daily with breakfast., Disp: , Rfl:  .  carvedilol (COREG) 3.125 MG tablet, Take 1 tablet (3.125 mg total) by mouth 2 (two) times daily with a meal., Disp: 180 tablet, Rfl: 3 .  Coenzyme Q10 (CO Q 10) 100 MG CAPS, Take 100 mg by mouth every morning. , Disp: , Rfl:  .  dexamethasone (DECADRON) 4 MG tablet, Take 5 tablets (20 mg total) by mouth once a week., Disp: 120 tablet, Rfl: 3 .  lansoprazole (PREVACID) 30 MG capsule, Take 30 mg by mouth daily at 12 noon. Pt has not been taking daily., Disp: , Rfl:  .  lenalidomide (REVLIMID) 15 MG capsule, Take 1 capsule (15 mg total) by mouth daily. Take for 21 days. Off for 7 days. [id number], Disp: 21 capsule, Rfl: 0 .  lisinopril (PRINIVIL,ZESTRIL) 2.5 MG tablet, Take 1 tablet (2.5 mg total) by mouth daily., Disp: 30 tablet, Rfl: 3 .  magnesium 30 MG tablet, Take 30 mg by mouth every morning., Disp: , Rfl:  .  omeprazole (PRILOSEC) 20 MG capsule, TAKE 1 CAPSULE BY MOUTH EVERY MORNING, Disp: 30 capsule, Rfl: 0 .  polyethylene glycol (MIRALAX / GLYCOLAX) packet, Take 17 g by mouth daily., Disp: , Rfl:  .  potassium chloride SA (K-DUR,KLOR-CON) 20 MEQ tablet, Take  1 tablet (20 mEq total) by mouth daily., Disp: 30 tablet, Rfl: 3 .  spironolactone (ALDACTONE) 25 MG tablet, Take 1 tablet (25 mg total) by mouth daily., Disp: 30 tablet, Rfl: 6 .  torsemide (DEMADEX) 20 MG tablet, Take one tab (20mg ) every other day, Disp: 30 tablet, Rfl: 3 No current facility-administered medications for this visit.  Facility-Administered Medications Ordered in Other Visits:  .  0.9 %  sodium chloride infusion, , Intravenous, Continuous, [name], MD, Last Rate: 20 mL/hr at 04/01/15 1230 .  sodium chloride 0.9 % injection 10 mL, 10 mL, Intracatheter, PRN, [name], MD, 10 mL at 04/16/14 1332 .  sodium chloride 0.9 % injection 10 mL, 10 mL, Intravenous, PRN, [name], MD, 10 mL at 04/01/15 1338  Allergies: No Known Allergies  Past Medical History, Surgical history, Social history, and Family History were reviewed and updated.  Review of Systems: As above  Physical Exam:  weight is 138 lb (62.596 kg). Her oral temperature is 98.2 F (36.8 C). Her blood pressure is 153/62 and her pulse is 75. Her respiration is 16.   Elderly, thin white female in no obvious distress. Head and neck exam shows no ocular or oral lesions. There are no palpable cervical or supraclavicular lymph nodes. Lungs are clear. Cardiac exam regular rate and rhythm. She has an occasional extra beat. She has a 1/6 systolic ejection murmur. Abdomen is soft. She has good bowel sounds. There is no fluid wave. There is no palpable liver or spleen tip. Extremities shows no clubbing, cyanosis or edema. She has some trace edema in her lower legs. She has some age-related osteoarthritic changes. Breast exam shows left breast with no masses, edema or erythema. There is no left axillary adenopathy. Right chest wall shows a well-healed mastectomy. No right chest wall nodules are noted. There is no right axillary adenopathy. Neurological exam shows no focal neurological deficits.  Lab Results  Component Value Date   WBC 4.0 04/01/2015   HGB 11.9 04/01/2015   HCT 35.3 04/01/2015   MCV 110* 04/01/2015   PLT 130* 04/01/2015     Chemistry      Component Value Date/Time   NA 135 04/01/2015 1121   NA 138 06/22/2014 1035   K 3.7 04/01/2015 1121   K 4.6 06/22/2014 1035   CL 102 04/01/2015 1121   CL 106 06/22/2014 1035   CO2 25 04/01/2015 1121   CO2 23 06/22/2014 1035   BUN 18 04/01/2015 1121   BUN 37* 06/22/2014 1035   CREATININE 1.4* 04/01/2015 1121   CREATININE 1.48* 06/22/2014 1035      Component Value Date/Time   CALCIUM 9.0 04/01/2015 1121   CALCIUM 9.0 06/22/2014 1035   CALCIUM 11.1* 11/13/2012 1526   ALKPHOS 69 04/01/2015 1121   ALKPHOS 78 06/22/2014 1035   AST 19 04/01/2015  1121   AST 13 06/22/2014 1035   ALT 28 04/01/2015 1121   ALT 16 06/22/2014 1035   BILITOT 1.20 04/01/2015 1121   BILITOT 0.4 06/22/2014 1035         Impression and Plan: [name] is 81 year old white female. She has both IgA kappa myeloma and stage II breast cancer. She continues to look quite good. She's responding. We will have to see what her monoclonal spike is.   We will go ahead and give her Zometa today. Her renal function is stable. Her hemoglobin is looking better so she will not need any Aranesp.  I don't  see any issues with respect to the breast cancer. This, we have to watch closely. She certainly is at risk for recurrence of this.  She's going to be moving down to [place]. She will be moving down there in August. Her 2 sons live down there area and she wants come back to see [place] and see her cardiologist. Her cardiologist has a working really hard on her. I'm glad that her cardiac function is back to normal.  I spent about 30 minutes with her today.     [name], MD 6/13/20161:39 PM

## 2015-04-02 LAB — IRON AND TIBC CHCC
%SAT: 34 % (ref 21–57)
Iron: 80 ug/dL (ref 41–142)
TIBC: 235 ug/dL — ABNORMAL LOW (ref 236–444)
UIBC: 155 ug/dL (ref 120–384)

## 2015-04-02 LAB — FERRITIN CHCC: Ferritin: 411 ng/ml — ABNORMAL HIGH (ref 9–269)

## 2015-04-03 LAB — IGG, IGA, IGM
IGA: 429 mg/dL — AB (ref 69–380)
IGM, SERUM: 56 mg/dL (ref 52–322)
IgG (Immunoglobin G), Serum: 526 mg/dL — ABNORMAL LOW (ref 690–1700)

## 2015-04-03 LAB — PROTEIN ELECTROPHORESIS, SERUM, WITH REFLEX
Abnormal Protein Band1: 0.3 g/dL
Albumin ELP: 3.9 g/dL (ref 3.8–4.8)
Alpha-1-Globulin: 0.4 g/dL — ABNORMAL HIGH (ref 0.2–0.3)
Alpha-2-Globulin: 0.9 g/dL (ref 0.5–0.9)
BETA GLOBULIN: 0.4 g/dL (ref 0.4–0.6)
Beta 2: 0.3 g/dL (ref 0.2–0.5)
Gamma Globulin: 0.8 g/dL (ref 0.8–1.7)
Total Protein, Serum Electrophoresis: 6.6 g/dL (ref 6.1–8.1)

## 2015-04-03 LAB — CANCER ANTIGEN 27.29: CA 27.29: 17 U/mL (ref 0–39)

## 2015-04-03 LAB — IFE INTERPRETATION

## 2015-04-03 LAB — KAPPA/LAMBDA LIGHT CHAINS
KAPPA LAMBDA RATIO: 2.2 — AB (ref 0.26–1.65)
Kappa free light chain: 4.43 mg/dL — ABNORMAL HIGH (ref 0.33–1.94)
Lambda Free Lght Chn: 2.01 mg/dL (ref 0.57–2.63)

## 2015-04-03 LAB — RETICULOCYTES (CHCC)
ABS RETIC: 43.2 10*3/uL (ref 19.0–186.0)
RBC.: 3.32 MIL/uL — ABNORMAL LOW (ref 3.87–5.11)
Retic Ct Pct: 1.3 % (ref 0.4–2.3)

## 2015-04-04 ENCOUNTER — Telehealth: Payer: Self-pay | Admitting: *Deleted

## 2015-04-04 NOTE — Telephone Encounter (Signed)
-----   Message from [name], MD sent at 04/03/2015  6:25 PM EDT ----- Please call and tell her that the myeloma is still nice and low. The myeloma level is 0.3. Thanks

## 2015-04-05 ENCOUNTER — Telehealth: Payer: Self-pay | Admitting: Nurse Practitioner

## 2015-04-05 NOTE — Telephone Encounter (Addendum)
Pt verbalized understanding and appreciation.    ----- Message from [name], MD sent at 04/03/2015  6:25 PM EDT ----- Please call and tell her that the myeloma is still nice and low. The myeloma level is 0.3. Thanks

## 2015-04-12 ENCOUNTER — Other Ambulatory Visit: Payer: Self-pay | Admitting: *Deleted

## 2015-04-12 DIAGNOSIS — C9000 Multiple myeloma not having achieved remission: Secondary | ICD-10-CM

## 2015-04-12 MED ORDER — LENALIDOMIDE 15 MG PO CAPS: 15.0000 mg | ORAL_CAPSULE | Freq: Every day | ORAL | Status: DC

## 2015-05-14 ENCOUNTER — Encounter: Payer: Self-pay | Admitting: Hematology & Oncology

## 2015-05-14 ENCOUNTER — Ambulatory Visit (HOSPITAL_BASED_OUTPATIENT_CLINIC_OR_DEPARTMENT_OTHER): Payer: Medicare Other

## 2015-05-14 ENCOUNTER — Ambulatory Visit (HOSPITAL_BASED_OUTPATIENT_CLINIC_OR_DEPARTMENT_OTHER): Payer: Medicare Other | Admitting: Hematology & Oncology

## 2015-05-14 VITALS — BP 132/75 | HR 74 | Temp 98.2°F | Resp 16 | Ht 64.0 in | Wt 132.0 lb

## 2015-05-14 DIAGNOSIS — C50911 Malignant neoplasm of unspecified site of right female breast: Secondary | ICD-10-CM | POA: Diagnosis not present

## 2015-05-14 DIAGNOSIS — D500 Iron deficiency anemia secondary to blood loss (chronic): Secondary | ICD-10-CM

## 2015-05-14 DIAGNOSIS — C9000 Multiple myeloma not having achieved remission: Secondary | ICD-10-CM

## 2015-05-14 DIAGNOSIS — C773 Secondary and unspecified malignant neoplasm of axilla and upper limb lymph nodes: Secondary | ICD-10-CM | POA: Diagnosis not present

## 2015-05-14 DIAGNOSIS — C50912 Malignant neoplasm of unspecified site of left female breast: Secondary | ICD-10-CM

## 2015-05-14 DIAGNOSIS — D630 Anemia in neoplastic disease: Secondary | ICD-10-CM | POA: Diagnosis not present

## 2015-05-14 LAB — CBC WITH DIFFERENTIAL (CANCER CENTER ONLY)
BASO#: 0.1 10*3/uL (ref 0.0–0.2)
BASO%: 0.9 % (ref 0.0–2.0)
EOS%: 3 % (ref 0.0–7.0)
Eosinophils Absolute: 0.2 10*3/uL (ref 0.0–0.5)
HEMATOCRIT: 35 % (ref 34.8–46.6)
HEMOGLOBIN: 11.9 g/dL (ref 11.6–15.9)
LYMPH#: 1 10*3/uL (ref 0.9–3.3)
LYMPH%: 18.9 % (ref 14.0–48.0)
MCH: 37 pg — ABNORMAL HIGH (ref 26.0–34.0)
MCHC: 34 g/dL (ref 32.0–36.0)
MCV: 109 fL — AB (ref 81–101)
MONO#: 1.3 10*3/uL — ABNORMAL HIGH (ref 0.1–0.9)
MONO%: 23.7 % — ABNORMAL HIGH (ref 0.0–13.0)
NEUT#: 2.9 10*3/uL (ref 1.5–6.5)
NEUT%: 53.5 % (ref 39.6–80.0)
Platelets: 235 10*3/uL (ref 145–400)
RBC: 3.22 10*6/uL — ABNORMAL LOW (ref 3.70–5.32)
RDW: 13.9 % (ref 11.1–15.7)
WBC: 5.4 10*3/uL (ref 3.9–10.0)

## 2015-05-14 LAB — CMP (CANCER CENTER ONLY)
ALT: 23 U/L (ref 10–47)
AST: 20 U/L (ref 11–38)
Albumin: 3.7 g/dL (ref 3.3–5.5)
Alkaline Phosphatase: 66 U/L (ref 26–84)
BILIRUBIN TOTAL: 0.8 mg/dL (ref 0.20–1.60)
BUN, Bld: 26 mg/dL — ABNORMAL HIGH (ref 7–22)
CALCIUM: 10.2 mg/dL (ref 8.0–10.3)
CHLORIDE: 103 meq/L (ref 98–108)
CO2: 28 mEq/L (ref 18–33)
Creat: 1.3 mg/dl — ABNORMAL HIGH (ref 0.6–1.2)
Glucose, Bld: 105 mg/dL (ref 73–118)
POTASSIUM: 3.8 meq/L (ref 3.3–4.7)
Sodium: 138 mEq/L (ref 128–145)
Total Protein: 6.9 g/dL (ref 6.4–8.1)

## 2015-05-14 LAB — IRON AND TIBC CHCC
%SAT: 42 % (ref 21–57)
IRON: 110 ug/dL (ref 41–142)
TIBC: 263 ug/dL (ref 236–444)
UIBC: 153 ug/dL (ref 120–384)

## 2015-05-14 LAB — FERRITIN CHCC: Ferritin: 405 ng/ml — ABNORMAL HIGH (ref 9–269)

## 2015-05-14 MED ORDER — ZOLEDRONIC ACID 4 MG/5ML IV CONC
3.0000 mg | Freq: Once | INTRAVENOUS | Status: AC
  Administered 2015-05-14: 3 mg via INTRAVENOUS
  Filled 2015-05-14: qty 3.75

## 2015-05-14 MED ORDER — HEPARIN SOD (PORK) LOCK FLUSH 100 UNIT/ML IV SOLN
500.0000 [IU] | Freq: Once | INTRAVENOUS | Status: AC
  Administered 2015-05-14: 500 [IU] via INTRAVENOUS
  Filled 2015-05-14: qty 5

## 2015-05-14 MED ORDER — SODIUM CHLORIDE 0.9 % IJ SOLN
10.0000 mL | INTRAMUSCULAR | Status: DC | PRN
  Administered 2015-05-14: 10 mL via INTRAVENOUS
  Filled 2015-05-14: qty 10

## 2015-05-14 NOTE — Patient Instructions (Signed)

## 2015-05-14 NOTE — Progress Notes (Signed)
Hematology and Oncology Follow Up Visit  Jacqueline Rivas [id number] 04-29-1932 81 y.o. 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 is back for followup. She looks real good. She is under quite a bit of stress. She is given rate to move down [place]. There are a lot of things that she just does not want to get rid of. As such, she is having a tough time with the ready for the big move.  She really has had no problems with the Revlimid. Her cardiac function is slowly improving. An echocardiogram done in early April showed an ejection fraction of 55% which is back to normal.  Her last monoclonal studies showed stability with the myeloma.   Her myeloma is responding nicely. Her monoclonal spike is now 0. 30 g/dL. Her Kappa light chain was 4.43 mg/dL. She only has one monoclonal spike now. Her IgA level is  429 mg/dL.  She's doing well with Revlimid. She very few toxicities from Revlimid.  She's had no problems with cough. She had no increased shortness of breath. There's been no leg swelling.  Her appetite is okay. She has had no nausea or vomiting.  She's had no diarrhea.  There's been no rashes.  She is complaining of some discomfort under the right arm. She thinks she is feel something.  When we last checked her CA 27.29 in June, it was normal at 66.  She still is not exercising that much. I told her that regular exercise is going be very critical for her. Hopefully, with the summer weather, she will do a little bit more outside.  Currently, her performance status is ECOG 1.  Medications:  Current  outpatient prescriptions:  .  aspirin EC 81 MG tablet, Take 162 mg by mouth daily., Disp: , Rfl:  .  calcium carbonate (OS-CAL) 600 MG TABS tablet, Take 1,200 mg by mouth daily with breakfast., Disp: , Rfl:  .  carvedilol (COREG) 3.125 MG tablet, Take 1 tablet (3.125 mg total) by mouth 2 (two) times daily with a meal., Disp: 180 tablet, Rfl: 3 .  Coenzyme Q10 (CO Q 10) 100 MG CAPS, Take 100 mg by mouth every morning. , Disp: , Rfl:  .  dexamethasone (DECADRON) 4 MG tablet, Take 5 tablets (20 mg total) by mouth once a week., Disp: 120 tablet, Rfl: 3 .  lansoprazole (PREVACID) 30 MG capsule, Take 30 mg by mouth daily at 12 noon. , Disp: , Rfl:  .  lenalidomide (REVLIMID) 15 MG capsule, Take 1 capsule (15 mg total) by mouth daily. Take for 21 days. Off for 7 days. [id number], Disp: 21 capsule, Rfl: 0 .  lisinopril (PRINIVIL,ZESTRIL) 2.5 MG tablet, Take 1 tablet (2.5 mg total) by mouth daily., Disp: 30 tablet, Rfl: 3 .  magnesium 30 MG tablet, Take 30 mg by mouth every morning., Disp: , Rfl:  .  omeprazole (PRILOSEC) 20 MG capsule, TAKE 1 CAPSULE BY MOUTH EVERY MORNING, Disp: 30 capsule, Rfl: 0 .  polyethylene glycol (MIRALAX / GLYCOLAX) packet, Take 17 g by mouth  as needed. , Disp: , Rfl:  .  potassium chloride SA (K-DUR,KLOR-CON) 20 MEQ tablet, Take 1 tablet (20 mEq total) by mouth daily. (Patient taking differently: Take 20 mEq by mouth as needed. ), Disp: 30 tablet, Rfl: 3 .  spironolactone (ALDACTONE) 25 MG tablet, Take 1 tablet (25 mg total) by mouth daily., Disp: 30 tablet, Rfl: 6 .  torsemide (DEMADEX) 20 MG tablet, Take one tab (20mg ) every other day, Disp: 30 tablet, Rfl: 3 No current facility-administered medications for this visit.  Facility-Administered Medications Ordered in Other Visits:  .  sodium chloride 0.9 % injection 10 mL, 10 mL, Intracatheter, PRN, [name], MD, 10 mL at 04/16/14 1332  Allergies: No Known Allergies  Past Medical History, Surgical history, Social  history, and Family History were reviewed and updated.  Review of Systems: As above  Physical Exam:  height is 5\' 4"  (1.626 m) and weight is 132 lb (59.875 kg). Her oral temperature is 98.2 F (36.8 C). Her blood pressure is 132/75 and her pulse is 74. Her respiration is 16.   Elderly, thin white female in no obvious distress. Head and neck exam shows no ocular or oral lesions. There are no palpable cervical or supraclavicular lymph nodes. Lungs are clear. Cardiac exam regular rate and rhythm. She has an occasional extra beat. She has a 1/6 systolic ejection murmur. Abdomen is soft. She has good bowel sounds. There is no fluid wave. There is no palpable liver or spleen tip. Extremities shows no clubbing, cyanosis or edema. She has some trace edema in her lower legs. She has some age-related osteoarthritic changes. Breast exam shows left breast with no masses, edema or erythema. There is no left axillary adenopathy. Right chest wall shows a well-healed mastectomy. No right chest wall nodules are noted. There is no right axillary adenopathy. Neurological exam shows no focal neurological deficits.  Lab Results  Component Value Date   WBC 5.4 05/14/2015   HGB 11.9 05/14/2015   HCT 35.0 05/14/2015   MCV 109* 05/14/2015   PLT 235 05/14/2015     Chemistry      Component Value Date/Time   NA 138 05/14/2015 1006   NA 138 06/22/2014 1035   K 3.8 05/14/2015 1006   K 4.6 06/22/2014 1035   CL 103 05/14/2015 1006   CL 106 06/22/2014 1035   CO2 28 05/14/2015 1006   CO2 23 06/22/2014 1035   BUN 26* 05/14/2015 1006   BUN 37* 06/22/2014 1035   CREATININE 1.3* 05/14/2015 1006   CREATININE 1.48* 06/22/2014 1035      Component Value Date/Time   CALCIUM 10.2 05/14/2015 1006   CALCIUM 9.0 06/22/2014 1035   CALCIUM 11.1* 11/13/2012 1526   ALKPHOS 66 05/14/2015 1006   ALKPHOS 78 06/22/2014 1035   AST 20 05/14/2015 1006   AST 13 06/22/2014 1035   ALT 23 05/14/2015 1006   ALT 16 06/22/2014 1035    BILITOT 0.80 05/14/2015 1006   BILITOT 0.4 06/22/2014 1035         Impression and Plan: Jacqueline Rivas is 81 year old white female. She has both IgA kappa myeloma and stage II breast cancer. She continues to look quite good. She's responding. We will have to see what her monoclonal spike is.   We will go ahead and give her Zometa today. Her renal function is stable. Her hemoglobin is looking better so she will not need any Aranesp.  I don't see any issues with respect to the breast cancer.  This, we have to watch closely. She certainly is at risk for recurrence of this.  She's going to be moving down to [place]. She will be moving down there in August. Her 2 sons live down there area and she wants come back to see [place] and see her cardiologist. Her cardiologist has a working really hard on her. I'm glad that her cardiac function is back to normal.  I spent about 30 minutes with her today.     [name], MD 7/26/201611:32 AM

## 2015-05-17 ENCOUNTER — Telehealth: Payer: Self-pay

## 2015-05-17 ENCOUNTER — Other Ambulatory Visit: Payer: Self-pay

## 2015-05-17 DIAGNOSIS — C9000 Multiple myeloma not having achieved remission: Secondary | ICD-10-CM

## 2015-05-17 LAB — PROTEIN ELECTROPHORESIS, SERUM, WITH REFLEX
Abnormal Protein Band1: 0.3 g/dL
Albumin ELP: 3.9 g/dL (ref 3.8–4.8)
Alpha-1-Globulin: 0.4 g/dL — ABNORMAL HIGH (ref 0.2–0.3)
Alpha-2-Globulin: 0.9 g/dL (ref 0.5–0.9)
BETA 2: 0.3 g/dL (ref 0.2–0.5)
Beta Globulin: 0.4 g/dL (ref 0.4–0.6)
GAMMA GLOBULIN: 0.8 g/dL (ref 0.8–1.7)
Total Protein, Serum Electrophoresis: 6.7 g/dL (ref 6.1–8.1)

## 2015-05-17 LAB — KAPPA/LAMBDA LIGHT CHAINS
Kappa free light chain: 7.98 mg/dL — ABNORMAL HIGH (ref 0.33–1.94)
Kappa:Lambda Ratio: 5.15 — ABNORMAL HIGH (ref 0.26–1.65)
Lambda Free Lght Chn: 1.55 mg/dL (ref 0.57–2.63)

## 2015-05-17 LAB — VITAMIN D 25 HYDROXY (VIT D DEFICIENCY, FRACTURES): VIT D 25 HYDROXY: 28 ng/mL — AB (ref 30–100)

## 2015-05-17 LAB — IFE INTERPRETATION

## 2015-05-17 LAB — IGG, IGA, IGM
IGM, SERUM: 53 mg/dL (ref 52–322)
IgA: 409 mg/dL — ABNORMAL HIGH (ref 69–380)
IgG (Immunoglobin G), Serum: 557 mg/dL — ABNORMAL LOW (ref 690–1700)

## 2015-05-17 MED ORDER — LENALIDOMIDE 15 MG PO CAPS: 15.0000 mg | ORAL_CAPSULE | Freq: Every day | ORAL | Status: DC

## 2015-05-17 NOTE — Telephone Encounter (Addendum)
-----   Message from [name], MD sent at 05/17/2015 11:48 AM EDT ----- Call - myeloma is stable!!!  This is good!!  Film/video editor  Generic message left on non personalized VM for lab results. dph

## 2015-07-03 ENCOUNTER — Ambulatory Visit (HOSPITAL_BASED_OUTPATIENT_CLINIC_OR_DEPARTMENT_OTHER): Payer: Medicare Other | Admitting: Hematology & Oncology

## 2015-07-03 ENCOUNTER — Ambulatory Visit (HOSPITAL_BASED_OUTPATIENT_CLINIC_OR_DEPARTMENT_OTHER): Payer: Medicare Other

## 2015-07-03 VITALS — BP 126/56 | HR 66 | Temp 98.0°F | Resp 16 | Wt 134.0 lb

## 2015-07-03 DIAGNOSIS — C9000 Multiple myeloma not having achieved remission: Secondary | ICD-10-CM

## 2015-07-03 DIAGNOSIS — C773 Secondary and unspecified malignant neoplasm of axilla and upper limb lymph nodes: Secondary | ICD-10-CM

## 2015-07-03 DIAGNOSIS — C50912 Malignant neoplasm of unspecified site of left female breast: Secondary | ICD-10-CM

## 2015-07-03 DIAGNOSIS — C50911 Malignant neoplasm of unspecified site of right female breast: Secondary | ICD-10-CM

## 2015-07-03 DIAGNOSIS — C50919 Malignant neoplasm of unspecified site of unspecified female breast: Secondary | ICD-10-CM | POA: Diagnosis not present

## 2015-07-03 LAB — CBC WITH DIFFERENTIAL (CANCER CENTER ONLY)
BASO#: 0 10*3/uL (ref 0.0–0.2)
BASO%: 0.7 % (ref 0.0–2.0)
EOS%: 3.3 % (ref 0.0–7.0)
Eosinophils Absolute: 0.2 10*3/uL (ref 0.0–0.5)
HCT: 32.3 % — ABNORMAL LOW (ref 34.8–46.6)
HGB: 10.9 g/dL — ABNORMAL LOW (ref 11.6–15.9)
LYMPH#: 0.7 10*3/uL — ABNORMAL LOW (ref 0.9–3.3)
LYMPH%: 16.3 % (ref 14.0–48.0)
MCH: 36.8 pg — ABNORMAL HIGH (ref 26.0–34.0)
MCHC: 33.7 g/dL (ref 32.0–36.0)
MCV: 109 fL — ABNORMAL HIGH (ref 81–101)
MONO#: 0.8 10*3/uL (ref 0.1–0.9)
MONO%: 16.6 % — ABNORMAL HIGH (ref 0.0–13.0)
NEUT#: 2.9 10*3/uL (ref 1.5–6.5)
NEUT%: 63.1 % (ref 39.6–80.0)
PLATELETS: 147 10*3/uL (ref 145–400)
RBC: 2.96 10*6/uL — AB (ref 3.70–5.32)
RDW: 14.2 % (ref 11.1–15.7)
WBC: 4.5 10*3/uL (ref 3.9–10.0)

## 2015-07-03 LAB — CMP (CANCER CENTER ONLY)
ALK PHOS: 63 U/L (ref 26–84)
ALT: 20 U/L (ref 10–47)
AST: 26 U/L (ref 11–38)
Albumin: 3.8 g/dL (ref 3.3–5.5)
BILIRUBIN TOTAL: 1.1 mg/dL (ref 0.20–1.60)
BUN, Bld: 26 mg/dL — ABNORMAL HIGH (ref 7–22)
CALCIUM: 8.7 mg/dL (ref 8.0–10.3)
CO2: 21 mEq/L (ref 18–33)
CREATININE: 1.4 mg/dL — AB (ref 0.6–1.2)
Chloride: 106 mEq/L (ref 98–108)
GLUCOSE: 89 mg/dL (ref 73–118)
Potassium: 4.1 mEq/L (ref 3.3–4.7)
Sodium: 135 mEq/L (ref 128–145)
Total Protein: 6.2 g/dL — ABNORMAL LOW (ref 6.4–8.1)

## 2015-07-03 LAB — FERRITIN CHCC: Ferritin: 375 ng/ml — ABNORMAL HIGH (ref 9–269)

## 2015-07-03 LAB — IRON AND TIBC CHCC
%SAT: 27 % (ref 21–57)
IRON: 60 ug/dL (ref 41–142)
TIBC: 219 ug/dL — ABNORMAL LOW (ref 236–444)
UIBC: 159 ug/dL (ref 120–384)

## 2015-07-03 MED ORDER — HEPARIN SOD (PORK) LOCK FLUSH 100 UNIT/ML IV SOLN
500.0000 [IU] | Freq: Once | INTRAVENOUS | Status: AC
  Administered 2015-07-03: 500 [IU] via INTRAVENOUS
  Filled 2015-07-03: qty 5

## 2015-07-03 MED ORDER — SODIUM CHLORIDE 0.9 % IJ SOLN
10.0000 mL | INTRAMUSCULAR | Status: DC | PRN
  Administered 2015-07-03: 10 mL via INTRAVENOUS
  Filled 2015-07-03: qty 10

## 2015-07-03 NOTE — Patient Instructions (Signed)

## 2015-07-03 NOTE — Progress Notes (Signed)
Pt held today per pt request d/t moving tomorrow. Dr Marin Olp aware and approves. dph

## 2015-07-03 NOTE — Progress Notes (Signed)
Hematology and Oncology Follow Up Visit  Rashea Hoskie [id number] [birth year] 81 y.o. 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  Torrance is back for followup. She looks real good. She is under quite a bit of stress. She is moving today. The movers are coming to get her stuff and move her to [place]. She'll be moving to assisted living. She is nervous about this but is also looking forward to this.  Her myeloma studies have been stable. Her last myeloma studies showed an M spike of 0.3 g/dL. Her IgA Level was 409 mg/dL. Her KAppa light chain was 8 mg/dL.   There's been no rashes.  She is complaining of some discomfort under the right arm. She thinks she is feel something.  When we last checked her CA 27.29 in June, it was normal at 53.  So far, there has been no issues with cardiomyopathy. Her cardiac function has almost returned to normal.. Her last echocardiogram done in June showed an ejection fraction of 55%.  Currently, her performance status is ECOG 1.  Medications:  Current outpatient prescriptions:  .  aspirin EC 81 MG tablet, Take 162 mg by mouth daily., Disp: , Rfl:  .  calcium carbonate (OS-CAL) 600 MG TABS tablet, Take 1,200 mg by mouth daily with breakfast., Disp: , Rfl:  .  carvedilol (COREG) 3.125 MG tablet, Take 1 tablet (3.125 mg total) by mouth 2 (two) times daily with a meal., Disp: 180 tablet, Rfl: 3 .  Coenzyme Q10 (CO Q 10) 100 MG CAPS, Take 100 mg by mouth every morning. , Disp: , Rfl:  .  dexamethasone (DECADRON) 4 MG tablet, Take 5 tablets (20 mg total) by mouth once a week., Disp: 120  tablet, Rfl: 3 .  lansoprazole (PREVACID) 30 MG capsule, Take 30 mg by mouth daily at 12 noon. , Disp: , Rfl:  .  lenalidomide (REVLIMID) 15 MG capsule, Take 1 capsule (15 mg total) by mouth daily. Take for 21 days. Off for 7 days. [id number]#[id number], Disp: 21 capsule, Rfl: 0 .  lisinopril (PRINIVIL,ZESTRIL) 2.5 MG tablet, Take 1 tablet (2.5 mg total) by mouth daily., Disp: 30 tablet, Rfl: 3 .  magnesium 30 MG tablet, Take 30 mg by mouth every morning., Disp: , Rfl:  .  omeprazole (PRILOSEC) 20 MG capsule, TAKE 1 CAPSULE BY MOUTH EVERY MORNING, Disp: 30 capsule, Rfl: 0 .  polyethylene glycol (MIRALAX / GLYCOLAX) packet, Take 17 g by mouth as needed. , Disp: , Rfl:  .  potassium chloride SA (K-DUR,KLOR-CON) 20 MEQ tablet, Take 1 tablet (20 mEq total) by mouth daily. (Patient taking differently: Take 20 mEq by mouth as needed. ), Disp: 30 tablet, Rfl: 3 .  spironolactone (ALDACTONE) 25 MG tablet, Take 1 tablet (25 mg total) by mouth daily., Disp: 30 tablet, Rfl: 6 .  torsemide (DEMADEX) 20 MG tablet, Take one tab (20mg ) every other day, Disp: 30 tablet, Rfl: 3 No current facility-administered medications for this visit.  Facility-Administered Medications Ordered in Other Visits:  .  sodium chloride 0.9 % injection  10 mL, 10 mL, Intracatheter, PRN, [name], MD, 10 mL at 04/16/14 1332  Allergies: No Known Allergies  Past Medical History, Surgical history, Social history, and Family History were reviewed and updated.  Review of Systems: As above  Physical Exam:  weight is 134 lb (60.782 kg). Her oral temperature is 98 F (36.7 C). Her blood pressure is 126/56 and her pulse is 66. Her respiration is 16.   Elderly, thin white female in no obvious distress. Head and neck exam shows no ocular or oral lesions. There are no palpable cervical or supraclavicular lymph nodes. Lungs are clear. Cardiac exam regular rate and rhythm. She has an occasional extra beat. She has a 1/6 systolic ejection murmur.  Abdomen is soft. She has good bowel sounds. There is no fluid wave. There is no palpable liver or spleen tip. Extremities shows no clubbing, cyanosis or edema. She has some trace edema in her lower legs. She has some age-related osteoarthritic changes. Breast exam shows left breast with no masses, edema or erythema. There is no left axillary adenopathy. Right chest wall shows a well-healed mastectomy. No right chest wall nodules are noted. There is no right axillary adenopathy. Neurological exam shows no focal neurological deficits.  Lab Results  Component Value Date   WBC 4.5 07/03/2015   HGB 10.9* 07/03/2015   HCT 32.3* 07/03/2015   MCV 109* 07/03/2015   PLT 147 07/03/2015     Chemistry      Component Value Date/Time   NA 135 07/03/2015 1124   NA 138 06/22/2014 1035   K 4.1 07/03/2015 1124   K 4.6 06/22/2014 1035   CL 106 07/03/2015 1124   CL 106 06/22/2014 1035   CO2 21 07/03/2015 1124   CO2 23 06/22/2014 1035   BUN 26* 07/03/2015 1124   BUN 37* 06/22/2014 1035   CREATININE 1.4* 07/03/2015 1124   CREATININE 1.48* 06/22/2014 1035      Component Value Date/Time   CALCIUM 8.7 07/03/2015 1124   CALCIUM 9.0 06/22/2014 1035   CALCIUM 11.1* 11/13/2012 1526   ALKPHOS 63 07/03/2015 1124   ALKPHOS 78 06/22/2014 1035   AST 26 07/03/2015 1124   AST 13 06/22/2014 1035   ALT 20 07/03/2015 1124   ALT 16 06/22/2014 1035   BILITOT 1.10 07/03/2015 1124   BILITOT 0.4 06/22/2014 1035         Impression and Plan: Ms. Gathright is 81 year old white female. She has both IgA kappa myeloma and stage II breast cancer. She continues to look quite good. She's responding. We will have to see what her monoclonal spike is.   Since she is moving today, we can hold off on the Zometa. We does get this into her when we see her back.  I'm glad that she is able to move down to [place] and be closer to her family. I think this will help her out quite a bit. I think that she'll exercise more. I think  that she'll eat better.  I spent about 30 minutes with her today.     [name], MD 9/14/20161:04 PM

## 2015-07-04 ENCOUNTER — Other Ambulatory Visit: Payer: Self-pay | Admitting: *Deleted

## 2015-07-04 DIAGNOSIS — C9000 Multiple myeloma not having achieved remission: Secondary | ICD-10-CM

## 2015-07-04 MED ORDER — LENALIDOMIDE 15 MG PO CAPS: 15.0000 mg | ORAL_CAPSULE | Freq: Every day | ORAL | Status: DC

## 2015-07-05 ENCOUNTER — Telehealth: Payer: Self-pay

## 2015-07-05 NOTE — Telephone Encounter (Addendum)
-----   Message from [name], MD sent at 07/05/2015  7:05 AM EDT ----- Call - myeloma is still very low!!  Have a great move to [place]!!! Bethanne Ginger with pt's son via phone re: above message. Pt had a successful move but does not currently have new telephone service set up. OK to call Romilda Garret for now with appts or results. dph

## 2015-07-08 LAB — IGG, IGA, IGM
IgA: 311 mg/dL (ref 69–380)
IgG (Immunoglobin G), Serum: 492 mg/dL — ABNORMAL LOW (ref 690–1700)
IgM, Serum: 51 mg/dL — ABNORMAL LOW (ref 52–322)

## 2015-07-08 LAB — PROTEIN ELECTROPHORESIS, SERUM, WITH REFLEX
ALBUMIN ELP: 3.5 g/dL — AB (ref 3.8–4.8)
Abnormal Protein Band1: 0.2 g/dL
Alpha-1-Globulin: 0.4 g/dL — ABNORMAL HIGH (ref 0.2–0.3)
Alpha-2-Globulin: 0.8 g/dL (ref 0.5–0.9)
BETA GLOBULIN: 0.4 g/dL (ref 0.4–0.6)
Beta 2: 0.3 g/dL (ref 0.2–0.5)
Gamma Globulin: 0.7 g/dL — ABNORMAL LOW (ref 0.8–1.7)
Total Protein, Serum Electrophoresis: 5.9 g/dL — ABNORMAL LOW (ref 6.1–8.1)

## 2015-07-08 LAB — IFE INTERPRETATION

## 2015-07-08 LAB — CANCER ANTIGEN 27.29: CA 27.29: 18 U/mL (ref 0–39)

## 2015-07-08 LAB — KAPPA/LAMBDA LIGHT CHAINS
KAPPA FREE LGHT CHN: 3.81 mg/dL — AB (ref 0.33–1.94)
Kappa:Lambda Ratio: 2.15 — ABNORMAL HIGH (ref 0.26–1.65)
LAMBDA FREE LGHT CHN: 1.77 mg/dL (ref 0.57–2.63)

## 2015-07-26 ENCOUNTER — Other Ambulatory Visit: Payer: Self-pay | Admitting: Nurse Practitioner

## 2015-07-26 DIAGNOSIS — C9000 Multiple myeloma not having achieved remission: Secondary | ICD-10-CM

## 2015-07-26 MED ORDER — LENALIDOMIDE 15 MG PO CAPS: 15.0000 mg | ORAL_CAPSULE | Freq: Every day | ORAL | Status: DC

## 2015-08-02 ENCOUNTER — Other Ambulatory Visit: Payer: Self-pay | Admitting: *Deleted

## 2015-08-02 DIAGNOSIS — C9000 Multiple myeloma not having achieved remission: Secondary | ICD-10-CM

## 2015-08-02 MED ORDER — LENALIDOMIDE 15 MG PO CAPS: 15.0000 mg | ORAL_CAPSULE | Freq: Every day | ORAL | Status: DC

## 2015-08-26 ENCOUNTER — Encounter: Payer: Self-pay | Admitting: Hematology & Oncology

## 2015-08-26 ENCOUNTER — Ambulatory Visit (HOSPITAL_BASED_OUTPATIENT_CLINIC_OR_DEPARTMENT_OTHER): Payer: Medicare Other

## 2015-08-26 ENCOUNTER — Ambulatory Visit (HOSPITAL_BASED_OUTPATIENT_CLINIC_OR_DEPARTMENT_OTHER): Payer: Medicare Other | Admitting: Hematology & Oncology

## 2015-08-26 VITALS — BP 133/70 | HR 77 | Temp 97.6°F | Resp 16 | Ht 64.0 in | Wt 139.0 lb

## 2015-08-26 DIAGNOSIS — C9001 Multiple myeloma in remission: Secondary | ICD-10-CM

## 2015-08-26 DIAGNOSIS — C773 Secondary and unspecified malignant neoplasm of axilla and upper limb lymph nodes: Secondary | ICD-10-CM

## 2015-08-26 DIAGNOSIS — N183 Chronic kidney disease, stage 3 (moderate): Secondary | ICD-10-CM

## 2015-08-26 DIAGNOSIS — T386X5A Adverse effect of antigonadotrophins, antiestrogens, antiandrogens, not elsewhere classified, initial encounter: Secondary | ICD-10-CM

## 2015-08-26 DIAGNOSIS — M818 Other osteoporosis without current pathological fracture: Secondary | ICD-10-CM

## 2015-08-26 DIAGNOSIS — C9000 Multiple myeloma not having achieved remission: Secondary | ICD-10-CM

## 2015-08-26 DIAGNOSIS — C50911 Malignant neoplasm of unspecified site of right female breast: Secondary | ICD-10-CM

## 2015-08-26 DIAGNOSIS — C50919 Malignant neoplasm of unspecified site of unspecified female breast: Secondary | ICD-10-CM | POA: Diagnosis not present

## 2015-08-26 DIAGNOSIS — D631 Anemia in chronic kidney disease: Secondary | ICD-10-CM

## 2015-08-26 LAB — CMP (CANCER CENTER ONLY)
ALK PHOS: 70 U/L (ref 26–84)
ALT: 32 U/L (ref 10–47)
AST: 27 U/L (ref 11–38)
Albumin: 3.4 g/dL (ref 3.3–5.5)
BUN: 25 mg/dL — AB (ref 7–22)
CO2: 22 mEq/L (ref 18–33)
CREATININE: 1.4 mg/dL — AB (ref 0.6–1.2)
Calcium: 9.4 mg/dL (ref 8.0–10.3)
Chloride: 105 mEq/L (ref 98–108)
GLUCOSE: 96 mg/dL (ref 73–118)
Potassium: 4.2 mEq/L (ref 3.3–4.7)
SODIUM: 141 meq/L (ref 128–145)
TOTAL PROTEIN: 6.6 g/dL (ref 6.4–8.1)
Total Bilirubin: 1 mg/dl (ref 0.20–1.60)

## 2015-08-26 LAB — CBC WITH DIFFERENTIAL (CANCER CENTER ONLY)
BASO#: 0.1 10*3/uL (ref 0.0–0.2)
BASO%: 1.4 % (ref 0.0–2.0)
EOS%: 5 % (ref 0.0–7.0)
Eosinophils Absolute: 0.3 10*3/uL (ref 0.0–0.5)
HCT: 36.9 % (ref 34.8–46.6)
HGB: 12.3 g/dL (ref 11.6–15.9)
LYMPH#: 0.8 10*3/uL — ABNORMAL LOW (ref 0.9–3.3)
LYMPH%: 15.3 % (ref 14.0–48.0)
MCH: 36 pg — ABNORMAL HIGH (ref 26.0–34.0)
MCHC: 33.3 g/dL (ref 32.0–36.0)
MCV: 108 fL — ABNORMAL HIGH (ref 81–101)
MONO#: 0.9 10*3/uL (ref 0.1–0.9)
MONO%: 17.2 % — AB (ref 0.0–13.0)
NEUT#: 3.2 10*3/uL (ref 1.5–6.5)
NEUT%: 61.1 % (ref 39.6–80.0)
PLATELETS: 108 10*3/uL — AB (ref 145–400)
RBC: 3.42 10*6/uL — AB (ref 3.70–5.32)
RDW: 13.9 % (ref 11.1–15.7)
WBC: 5.2 10*3/uL (ref 3.9–10.0)

## 2015-08-26 MED ORDER — SODIUM CHLORIDE 0.9 % IV SOLN
3.0000 mg | Freq: Once | INTRAVENOUS | Status: AC
  Administered 2015-08-26: 3 mg via INTRAVENOUS
  Filled 2015-08-26: qty 3.75

## 2015-08-26 MED ORDER — POTASSIUM CHLORIDE CRYS ER 20 MEQ PO TBCR: 20.0000 meq | EXTENDED_RELEASE_TABLET | Freq: Every day | ORAL | Status: DC

## 2015-08-26 NOTE — Patient Instructions (Signed)

## 2015-08-26 NOTE — Progress Notes (Signed)
Hematology and Oncology Follow Up Visit  Jacqueline Rivas [id number] 1932/02/07 81 y.o. 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 is back for followup. She looks real good. She has moved to [place]. She moved the day that we saw her back in September. She really looks good. She is walking. this but is also looking forward to this.  Her myeloma studies have been stable. Her last myeloma studies showed an M spike of 0.2 g/dL. Her IgA Level was 492 mg/dL. Her KAppa light chain was 3.81 mg/dL.   There's been no rashes.  She is complaining of some discomfort under the right arm. She thinks she is feel something.  When we last checked her CA 27.29 in June, it was normal at 18.Marland Kitchen  So far, there has been no issues with cardiomyopathy. Her cardiac function has almost returned to normal.. Her last echocardiogram done in June showed an ejection fraction of 55%.  Currently, her performance status is ECOG 1.  Medications:  Current outpatient prescriptions:  .  aspirin EC 81 MG tablet, Take 162 mg by mouth daily., Disp: , Rfl:  .  calcium carbonate (OS-CAL) 600 MG TABS tablet, Take 1,200 mg by mouth daily with breakfast., Disp: , Rfl:  .  carvedilol (COREG) 3.125 MG tablet, Take 1 tablet (3.125 mg total) by mouth 2 (two) times daily with a meal., Disp: 180 tablet, Rfl: 3 .  Coenzyme Q10 (CO Q 10) 100 MG CAPS, Take 100 mg by mouth every morning. , Disp: , Rfl:  .  dexamethasone (DECADRON) 4 MG tablet, Take 5 tablets (20 mg total) by mouth once a week., Disp: 120 tablet, Rfl: 3 .  lansoprazole (PREVACID) 30 MG capsule, Take  30 mg by mouth daily at 12 noon. , Disp: , Rfl:  .  lenalidomide (REVLIMID) 15 MG capsule, Take 1 capsule (15 mg total) by mouth daily. Take for 21 days. Off for 7 days. [id number]., Disp: 21 capsule, Rfl: 0 .  lisinopril (PRINIVIL,ZESTRIL) 2.5 MG tablet, Take 1 tablet (2.5 mg total) by mouth daily., Disp: 30 tablet, Rfl: 3 .  magnesium 30 MG tablet, Take 30 mg by mouth every morning., Disp: , Rfl:  .  omeprazole (PRILOSEC) 20 MG capsule, TAKE 1 CAPSULE BY MOUTH EVERY MORNING, Disp: 30 capsule, Rfl: 0 .  polyethylene glycol (MIRALAX / GLYCOLAX) packet, Take 17 g by mouth as needed. , Disp: , Rfl:  .  spironolactone (ALDACTONE) 25 MG tablet, Take 1 tablet (25 mg total) by mouth daily., Disp: 30 tablet, Rfl: 6 .  torsemide (DEMADEX) 20 MG tablet, Take one tab (20mg ) every other day, Disp: 30 tablet, Rfl: 3 .  potassium chloride SA (K-DUR,KLOR-CON) 20 MEQ tablet, Take 1 tablet (20 mEq total) by mouth daily., Disp: 90 tablet, Rfl: 3 No current facility-administered medications for this visit.  Facility-Administered Medications Ordered in Other Visits:  .  sodium chloride 0.9 % injection 10 mL, 10 mL, Intracatheter, PRN, [name], MD, 10 mL at 04/16/14 1332  Allergies: No Known Allergies  Past Medical  History, Surgical history, Social history, and Family History were reviewed and updated.  Review of Systems: As above  Physical Exam:  height is 5\' 4"  (1.626 m) and weight is 139 lb (63.05 kg). Her oral temperature is 97.6 F (36.4 C). Her blood pressure is 133/70 and her pulse is 77. Her respiration is 16.   Elderly, thin white female in no obvious distress. Head and neck exam shows no ocular or oral lesions. There are no palpable cervical or supraclavicular lymph nodes. Lungs are clear. Cardiac exam regular rate and rhythm. She has an occasional extra beat. She has a 1/6 systolic ejection murmur. Abdomen is soft. She has good bowel sounds. There is no fluid wave. There is no palpable  liver or spleen tip. Extremities shows no clubbing, cyanosis or edema. She has some trace edema in her lower legs. She has some age-related osteoarthritic changes. Breast exam shows left breast with no masses, edema or erythema. There is no left axillary adenopathy. Right chest wall shows a well-healed mastectomy. No right chest wall nodules are noted. There is no right axillary adenopathy. Neurological exam shows no focal neurological deficits.  Lab Results  Component Value Date   WBC 5.2 08/26/2015   HGB 12.3 08/26/2015   HCT 36.9 08/26/2015   MCV 108* 08/26/2015   PLT 108* 08/26/2015     Chemistry      Component Value Date/Time   NA 141 08/26/2015 1122   NA 138 06/22/2014 1035   K 4.2 08/26/2015 1122   K 4.6 06/22/2014 1035   CL 105 08/26/2015 1122   CL 106 06/22/2014 1035   CO2 22 08/26/2015 1122   CO2 23 06/22/2014 1035   BUN 25* 08/26/2015 1122   BUN 37* 06/22/2014 1035   CREATININE 1.4* 08/26/2015 1122   CREATININE 1.48* 06/22/2014 1035      Component Value Date/Time   CALCIUM 9.4 08/26/2015 1122   CALCIUM 9.0 06/22/2014 1035   CALCIUM 11.1* 11/13/2012 1526   ALKPHOS 70 08/26/2015 1122   ALKPHOS 78 06/22/2014 1035   AST 27 08/26/2015 1122   AST 13 06/22/2014 1035   ALT 32 08/26/2015 1122   ALT 16 06/22/2014 1035   BILITOT 1.00 08/26/2015 1122   BILITOT 0.4 06/22/2014 1035         Impression and Plan: Jacqueline Rivas is 81 year old white female. She has both IgA kappa myeloma and stage II breast cancer. She continues to look quite good. She's responding. We will have to see what her monoclonal spike is.   I think that we might be able to consider decreasing her dose of Revlimid down to 10 mg a day. We will have to see what her M spike is.  I'm glad that she is now down to [place]. She seems happier. Her family is a lot closer. She will have a good Thanksgiving down there.  We will go ahead and give her Zometa today. I think this is reasonable.  We will plan to  get her back to see [place] in another month.  I spent about 30 minutes with her today.     [name], MD 11/7/20161:40 PM

## 2015-08-28 LAB — PROTEIN ELECTROPHORESIS, SERUM, WITH REFLEX
ALBUMIN ELP: 3.8 g/dL (ref 3.8–4.8)
ALPHA-2-GLOBULIN: 0.8 g/dL (ref 0.5–0.9)
Abnormal Protein Band1: 0.3 g/dL
Alpha-1-Globulin: 0.4 g/dL — ABNORMAL HIGH (ref 0.2–0.3)
BETA GLOBULIN: 0.4 g/dL (ref 0.4–0.6)
Beta 2: 0.3 g/dL (ref 0.2–0.5)
Gamma Globulin: 0.7 g/dL — ABNORMAL LOW (ref 0.8–1.7)
Total Protein, Serum Electrophoresis: 6.4 g/dL (ref 6.1–8.1)

## 2015-08-28 LAB — KAPPA/LAMBDA LIGHT CHAINS
Kappa free light chain: 10.1 mg/dL — ABNORMAL HIGH (ref 0.33–1.94)
Kappa:Lambda Ratio: 2.2 — ABNORMAL HIGH (ref 0.26–1.65)
Lambda Free Lght Chn: 4.59 mg/dL — ABNORMAL HIGH (ref 0.57–2.63)

## 2015-08-28 LAB — IGG, IGA, IGM
IGA: 333 mg/dL (ref 69–380)
IGG (IMMUNOGLOBIN G), SERUM: 588 mg/dL — AB (ref 690–1700)
IGM, SERUM: 57 mg/dL (ref 52–322)

## 2015-08-28 LAB — IFE INTERPRETATION

## 2015-08-29 ENCOUNTER — Telehealth: Payer: Self-pay | Admitting: *Deleted

## 2015-08-29 NOTE — Telephone Encounter (Addendum)
Message left on patient's personal voice mail  ----- Message from [name], MD sent at 08/28/2015  6:29 PM EST ----- Call - myeloma is holding steady!!!  Protein is still low!!!  pete

## 2015-09-02 ENCOUNTER — Other Ambulatory Visit: Payer: Self-pay | Admitting: *Deleted

## 2015-09-02 DIAGNOSIS — C9000 Multiple myeloma not having achieved remission: Secondary | ICD-10-CM

## 2015-09-02 MED ORDER — LENALIDOMIDE 15 MG PO CAPS: 15.0000 mg | ORAL_CAPSULE | Freq: Every day | ORAL | Status: DC

## 2015-09-26 ENCOUNTER — Other Ambulatory Visit: Payer: Self-pay | Admitting: *Deleted

## 2015-09-26 DIAGNOSIS — C9000 Multiple myeloma not having achieved remission: Secondary | ICD-10-CM

## 2015-09-26 MED ORDER — LENALIDOMIDE 15 MG PO CAPS: 15.0000 mg | ORAL_CAPSULE | Freq: Every day | ORAL | Status: DC

## 2015-10-04 ENCOUNTER — Encounter: Payer: Self-pay | Admitting: Hematology & Oncology

## 2015-10-04 ENCOUNTER — Ambulatory Visit (HOSPITAL_BASED_OUTPATIENT_CLINIC_OR_DEPARTMENT_OTHER): Payer: Medicare Other

## 2015-10-04 ENCOUNTER — Ambulatory Visit (HOSPITAL_BASED_OUTPATIENT_CLINIC_OR_DEPARTMENT_OTHER): Payer: Medicare Other | Admitting: Hematology & Oncology

## 2015-10-04 VITALS — BP 152/68 | HR 73 | Temp 97.3°F | Resp 18 | Ht 64.0 in | Wt 144.0 lb

## 2015-10-04 DIAGNOSIS — N183 Chronic kidney disease, stage 3 (moderate): Secondary | ICD-10-CM | POA: Diagnosis not present

## 2015-10-04 DIAGNOSIS — C50911 Malignant neoplasm of unspecified site of right female breast: Secondary | ICD-10-CM | POA: Diagnosis not present

## 2015-10-04 DIAGNOSIS — C9000 Multiple myeloma not having achieved remission: Secondary | ICD-10-CM

## 2015-10-04 DIAGNOSIS — M818 Other osteoporosis without current pathological fracture: Secondary | ICD-10-CM

## 2015-10-04 DIAGNOSIS — C773 Secondary and unspecified malignant neoplasm of axilla and upper limb lymph nodes: Secondary | ICD-10-CM

## 2015-10-04 DIAGNOSIS — D630 Anemia in neoplastic disease: Secondary | ICD-10-CM

## 2015-10-04 DIAGNOSIS — T386X5A Adverse effect of antigonadotrophins, antiestrogens, antiandrogens, not elsewhere classified, initial encounter: Secondary | ICD-10-CM

## 2015-10-04 DIAGNOSIS — C9001 Multiple myeloma in remission: Secondary | ICD-10-CM | POA: Diagnosis not present

## 2015-10-04 DIAGNOSIS — C50919 Malignant neoplasm of unspecified site of unspecified female breast: Secondary | ICD-10-CM | POA: Diagnosis not present

## 2015-10-04 DIAGNOSIS — C9002 Multiple myeloma in relapse: Secondary | ICD-10-CM

## 2015-10-04 DIAGNOSIS — D631 Anemia in chronic kidney disease: Secondary | ICD-10-CM

## 2015-10-04 LAB — IRON AND TIBC
%SAT: 35 % (ref 21–57)
IRON: 92 ug/dL (ref 41–142)
TIBC: 259 ug/dL (ref 236–444)
UIBC: 167 ug/dL (ref 120–384)

## 2015-10-04 LAB — CMP (CANCER CENTER ONLY)
ALBUMIN: 3.4 g/dL (ref 3.3–5.5)
ALK PHOS: 71 U/L (ref 26–84)
ALT: 37 U/L (ref 10–47)
AST: 34 U/L (ref 11–38)
BILIRUBIN TOTAL: 0.8 mg/dL (ref 0.20–1.60)
BUN: 24 mg/dL — AB (ref 7–22)
CHLORIDE: 106 meq/L (ref 98–108)
CO2: 23 mEq/L (ref 18–33)
CREATININE: 1.3 mg/dL — AB (ref 0.6–1.2)
Calcium: 9.8 mg/dL (ref 8.0–10.3)
Glucose, Bld: 96 mg/dL (ref 73–118)
Potassium: 4.3 mEq/L (ref 3.3–4.7)
Sodium: 138 mEq/L (ref 128–145)
TOTAL PROTEIN: 6.9 g/dL (ref 6.4–8.1)

## 2015-10-04 LAB — CBC WITH DIFFERENTIAL (CANCER CENTER ONLY)
BASO#: 0.1 10*3/uL (ref 0.0–0.2)
BASO%: 1.2 % (ref 0.0–2.0)
EOS ABS: 0.2 10*3/uL (ref 0.0–0.5)
EOS%: 3.4 % (ref 0.0–7.0)
HCT: 35.8 % (ref 34.8–46.6)
HGB: 12 g/dL (ref 11.6–15.9)
LYMPH#: 0.9 10*3/uL (ref 0.9–3.3)
LYMPH%: 18.7 % (ref 14.0–48.0)
MCH: 35.9 pg — AB (ref 26.0–34.0)
MCHC: 33.5 g/dL (ref 32.0–36.0)
MCV: 107 fL — AB (ref 81–101)
MONO#: 1 10*3/uL — AB (ref 0.1–0.9)
MONO%: 20.1 % — ABNORMAL HIGH (ref 0.0–13.0)
NEUT#: 2.8 10*3/uL (ref 1.5–6.5)
NEUT%: 56.6 % (ref 39.6–80.0)
PLATELETS: 124 10*3/uL — AB (ref 145–400)
RBC: 3.34 10*6/uL — ABNORMAL LOW (ref 3.70–5.32)
RDW: 13.7 % (ref 11.1–15.7)
WBC: 5 10*3/uL (ref 3.9–10.0)

## 2015-10-04 LAB — FERRITIN: Ferritin: 280 ng/ml — ABNORMAL HIGH (ref 9–269)

## 2015-10-04 MED ORDER — HEPARIN SOD (PORK) LOCK FLUSH 100 UNIT/ML IV SOLN
500.0000 [IU] | Freq: Once | INTRAVENOUS | Status: AC
  Administered 2015-10-04: 500 [IU] via INTRAVENOUS
  Filled 2015-10-04: qty 5

## 2015-10-04 MED ORDER — ZOLEDRONIC ACID 4 MG/5ML IV CONC
3.0000 mg | Freq: Once | INTRAVENOUS | Status: AC
  Administered 2015-10-04: 3 mg via INTRAVENOUS
  Filled 2015-10-04: qty 3.75

## 2015-10-04 MED ORDER — SODIUM CHLORIDE 0.9 % IJ SOLN
10.0000 mL | INTRAMUSCULAR | Status: DC | PRN
  Administered 2015-10-04: 10 mL via INTRAVENOUS
  Filled 2015-10-04: qty 10

## 2015-10-04 NOTE — Progress Notes (Signed)
Hematology and Oncology Follow Up Visit  Ericca Zuhlke [id number] 1932/03/13 81 y.o. 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  Hagler is back for followup. She looks real good. She has enjoyed her move to [place]. She had a very good Thanksgiving. It was nice that her family was already down there. She is looking forward to Christmas.  Her myeloma studies have been stable. Her last myeloma studies showed an M spike of 0.3 g/dL. Her IgA Level was 588 mg/dL. Her KAppa light chain was 10.10 mg/dL.   There's been no rashes.  She is complaining of some discomfort under the right arm. She thinks she is feel something.  When we last checked her CA 27.29 in September, it was normal at 18.Marland Kitchen  So far, there has been no issues with cardiomyopathy. Her cardiac function has almost returned to normal.. Her last echocardiogram done in June showed an ejection fraction of 55%.  Currently, her performance status is ECOG 1.  Medications:  Current outpatient prescriptions:  .  aspirin EC 81 MG tablet, Take 162 mg by mouth daily., Disp: , Rfl:  .  calcium carbonate (OS-CAL) 600 MG TABS tablet, Take 1,200 mg by mouth daily with breakfast., Disp: , Rfl:  .  carvedilol (COREG) 3.125 MG tablet, Take 1 tablet (3.125 mg total) by mouth 2 (two) times daily with a meal., Disp: 180 tablet, Rfl: 3 .  Coenzyme Q10 (CO Q 10) 100 MG CAPS, Take 100 mg by mouth every morning. , Disp: , Rfl:  .  dexamethasone (DECADRON) 4 MG tablet, Take 5 tablets (20 mg total) by mouth once a week., Disp: 120 tablet, Rfl: 3 .  lansoprazole (PREVACID) 30 MG  capsule, Take 30 mg by mouth daily at 12 noon. , Disp: , Rfl:  .  lenalidomide (REVLIMID) 15 MG capsule, Take 1 capsule (15 mg total) by mouth daily. Take for 21 days. Off for 7 days. [id number], Disp: 21 capsule, Rfl: 0 .  lisinopril (PRINIVIL,ZESTRIL) 2.5 MG tablet, Take 1 tablet (2.5 mg total) by mouth daily., Disp: 30 tablet, Rfl: 3 .  magnesium 30 MG tablet, Take 30 mg by mouth every morning., Disp: , Rfl:  .  omeprazole (PRILOSEC) 20 MG capsule, TAKE 1 CAPSULE BY MOUTH EVERY MORNING, Disp: 30 capsule, Rfl: 0 .  polyethylene glycol (MIRALAX / GLYCOLAX) packet, Take 17 g by mouth as needed. , Disp: , Rfl:  .  potassium chloride SA (K-DUR,KLOR-CON) 20 MEQ tablet, Take 1 tablet (20 mEq total) by mouth daily., Disp: 90 tablet, Rfl: 3 .  spironolactone (ALDACTONE) 25 MG tablet, Take 1 tablet (25 mg total) by mouth daily., Disp: 30 tablet, Rfl: 6 .  torsemide (DEMADEX) 20 MG tablet, Take one tab (20mg ) every other day, Disp: 30 tablet, Rfl: 3 No current facility-administered medications for this visit.  Facility-Administered Medications Ordered in Other Visits:  .  sodium chloride 0.9 % injection 10 mL, 10 mL, Intracatheter, PRN, [name], MD, 10 mL at 04/16/14 1332 .  zolendronic acid (ZOMETA) 3 mg in sodium chloride  0.9 % 100 mL IVPB, 3 mg, Intravenous, Once, [name], MD, Last Rate: 415 mL/hr at 10/04/15 1215, 3 mg at 10/04/15 1215  Allergies: No Known Allergies  Past Medical History, Surgical history, Social history, and Family History were reviewed and updated.  Review of Systems: As above  Physical Exam:  height is 5\' 4"  (1.626 m) and weight is 144 lb (65.318 kg). Her oral temperature is 97.3 F (36.3 C). Her blood pressure is 152/68 and her pulse is 73. Her respiration is 18.   Elderly, thin white female in no obvious distress. Head and neck exam shows no ocular or oral lesions. There are no palpable cervical or supraclavicular lymph nodes. Lungs are clear. Cardiac  exam regular rate and rhythm. She has an occasional extra beat. She has a 1/6 systolic ejection murmur. Abdomen is soft. She has good bowel sounds. There is no fluid wave. There is no palpable liver or spleen tip. Extremities shows no clubbing, cyanosis or edema. She has some trace edema in her lower legs. She has some age-related osteoarthritic changes. Breast exam shows left breast with no masses, edema or erythema. There is no left axillary adenopathy. Right chest wall shows a well-healed mastectomy. No right chest wall nodules are noted. There is no right axillary adenopathy. Neurological exam shows no focal neurological deficits.  Lab Results  Component Value Date   WBC 5.0 10/04/2015   HGB 12.0 10/04/2015   HCT 35.8 10/04/2015   MCV 107* 10/04/2015   PLT 124* 10/04/2015     Chemistry      Component Value Date/Time   NA 138 10/04/2015 1054   NA 138 06/22/2014 1035   K 4.3 10/04/2015 1054   K 4.6 06/22/2014 1035   CL 106 10/04/2015 1054   CL 106 06/22/2014 1035   CO2 23 10/04/2015 1054   CO2 23 06/22/2014 1035   BUN 24* 10/04/2015 1054   BUN 37* 06/22/2014 1035   CREATININE 1.3* 10/04/2015 1054   CREATININE 1.48* 06/22/2014 1035      Component Value Date/Time   CALCIUM 9.8 10/04/2015 1054   CALCIUM 9.0 06/22/2014 1035   CALCIUM 11.1* 11/13/2012 1526   ALKPHOS 71 10/04/2015 1054   ALKPHOS 78 06/22/2014 1035   AST 34 10/04/2015 1054   AST 13 06/22/2014 1035   ALT 37 10/04/2015 1054   ALT 16 06/22/2014 1035   BILITOT 0.80 10/04/2015 1054   BILITOT 0.4 06/22/2014 1035         Impression and Plan: Ms. Iruegas is 81 year old white female. She has both IgA kappa myeloma and stage II breast cancer. She continues to look quite good. She's responding. We will have to see what her monoclonal spike is.   I'm glad that she is now down to [place]. She seems happier. Her family is a lot closer. She had a good Thanksgiving down there. She is looking forward to Christmas.  We  will go ahead and give her Zometa today. I think this is reasonable.  We will plan to get her back to see [place] in another 6 weeks  I spent about 30 minutes with her today.     [name], MD 12/16/201612:17 PM

## 2015-10-04 NOTE — Patient Instructions (Signed)

## 2015-10-09 ENCOUNTER — Telehealth: Payer: Self-pay

## 2015-10-09 LAB — KAPPA/LAMBDA LIGHT CHAINS
KAPPA FREE LGHT CHN: 4.87 mg/dL — AB (ref 0.33–1.94)
Kappa:Lambda Ratio: 1.92 — ABNORMAL HIGH (ref 0.26–1.65)
Lambda Free Lght Chn: 2.53 mg/dL (ref 0.57–2.63)

## 2015-10-09 LAB — PROTEIN ELECTROPHORESIS, SERUM, WITH REFLEX
ALBUMIN ELP: 3.8 g/dL (ref 3.8–4.8)
ALPHA-1-GLOBULIN: 0.3 g/dL (ref 0.2–0.3)
Abnormal Protein Band1: 0.2 g/dL
Alpha-2-Globulin: 0.8 g/dL (ref 0.5–0.9)
BETA GLOBULIN: 0.4 g/dL (ref 0.4–0.6)
Beta 2: 0.3 g/dL (ref 0.2–0.5)
Gamma Globulin: 0.8 g/dL (ref 0.8–1.7)
TOTAL PROTEIN, SERUM ELECTROPHOR: 6.4 g/dL (ref 6.1–8.1)

## 2015-10-09 LAB — VITAMIN D 25 HYDROXY (VIT D DEFICIENCY, FRACTURES): VIT D 25 HYDROXY: 31 ng/mL (ref 30–100)

## 2015-10-09 LAB — IFE INTERPRETATION

## 2015-10-09 LAB — IGG, IGA, IGM
IGA: 352 mg/dL (ref 69–380)
IgG (Immunoglobin G), Serum: 652 mg/dL — ABNORMAL LOW (ref 690–1700)
IgM, Serum: 72 mg/dL (ref 52–322)

## 2015-10-09 LAB — CANCER ANTIGEN 27.29: CA 27.29: 15 U/mL (ref 0–39)

## 2015-10-09 NOTE — Telephone Encounter (Addendum)
-----   Message from [name], MD sent at 10/09/2015  7:04 AM EST ----- Call - myeloma is still very low and stable.  Great job!!!!  [employer] left to contact office for lab results. dph

## 2015-11-07 ENCOUNTER — Other Ambulatory Visit: Payer: Self-pay | Admitting: *Deleted

## 2015-11-07 DIAGNOSIS — C9000 Multiple myeloma not having achieved remission: Secondary | ICD-10-CM

## 2015-11-07 MED ORDER — LENALIDOMIDE 15 MG PO CAPS: 15.0000 mg | ORAL_CAPSULE | Freq: Every day | ORAL | Status: DC

## 2015-11-14 ENCOUNTER — Encounter: Payer: Self-pay | Admitting: Hematology & Oncology

## 2015-11-14 ENCOUNTER — Other Ambulatory Visit (HOSPITAL_BASED_OUTPATIENT_CLINIC_OR_DEPARTMENT_OTHER): Payer: Medicare Other

## 2015-11-14 ENCOUNTER — Ambulatory Visit (HOSPITAL_BASED_OUTPATIENT_CLINIC_OR_DEPARTMENT_OTHER): Payer: Medicare Other

## 2015-11-14 ENCOUNTER — Ambulatory Visit (HOSPITAL_BASED_OUTPATIENT_CLINIC_OR_DEPARTMENT_OTHER): Payer: Medicare Other | Admitting: Hematology & Oncology

## 2015-11-14 VITALS — BP 152/74 | HR 82 | Temp 98.6°F | Resp 18 | Ht 64.0 in | Wt 148.0 lb

## 2015-11-14 DIAGNOSIS — C773 Secondary and unspecified malignant neoplasm of axilla and upper limb lymph nodes: Secondary | ICD-10-CM | POA: Diagnosis not present

## 2015-11-14 DIAGNOSIS — C9000 Multiple myeloma not having achieved remission: Secondary | ICD-10-CM | POA: Diagnosis not present

## 2015-11-14 DIAGNOSIS — C9001 Multiple myeloma in remission: Secondary | ICD-10-CM

## 2015-11-14 DIAGNOSIS — D630 Anemia in neoplastic disease: Secondary | ICD-10-CM | POA: Diagnosis not present

## 2015-11-14 DIAGNOSIS — C50911 Malignant neoplasm of unspecified site of right female breast: Secondary | ICD-10-CM

## 2015-11-14 LAB — CMP (CANCER CENTER ONLY)
ALT(SGPT): 36 U/L (ref 10–47)
AST: 33 U/L (ref 11–38)
Albumin: 3.4 g/dL (ref 3.3–5.5)
Alkaline Phosphatase: 83 U/L (ref 26–84)
BUN: 24 mg/dL — AB (ref 7–22)
CHLORIDE: 109 meq/L — AB (ref 98–108)
CO2: 24 meq/L (ref 18–33)
CREATININE: 1.3 mg/dL — AB (ref 0.6–1.2)
Calcium: 9.8 mg/dL (ref 8.0–10.3)
Glucose, Bld: 101 mg/dL (ref 73–118)
Potassium: 4.2 mEq/L (ref 3.3–4.7)
SODIUM: 139 meq/L (ref 128–145)
Total Bilirubin: 0.8 mg/dl (ref 0.20–1.60)
Total Protein: 7.2 g/dL (ref 6.4–8.1)

## 2015-11-14 LAB — CBC WITH DIFFERENTIAL (CANCER CENTER ONLY)
BASO#: 0.1 10*3/uL (ref 0.0–0.2)
BASO%: 1.9 % (ref 0.0–2.0)
EOS ABS: 0.1 10*3/uL (ref 0.0–0.5)
EOS%: 1.9 % (ref 0.0–7.0)
HEMATOCRIT: 34.8 % (ref 34.8–46.6)
HGB: 12.2 g/dL (ref 11.6–15.9)
LYMPH#: 0.9 10*3/uL (ref 0.9–3.3)
LYMPH%: 19.4 % (ref 14.0–48.0)
MCH: 36.3 pg — AB (ref 26.0–34.0)
MCHC: 35.1 g/dL (ref 32.0–36.0)
MCV: 104 fL — AB (ref 81–101)
MONO#: 1 10*3/uL — AB (ref 0.1–0.9)
MONO%: 21.6 % — ABNORMAL HIGH (ref 0.0–13.0)
NEUT#: 2.6 10*3/uL (ref 1.5–6.5)
NEUT%: 55.2 % (ref 39.6–80.0)
PLATELETS: 195 10*3/uL (ref 145–400)
RBC: 3.36 10*6/uL — AB (ref 3.70–5.32)
RDW: 14 % (ref 11.1–15.7)
WBC: 4.7 10*3/uL (ref 3.9–10.0)

## 2015-11-14 LAB — LACTATE DEHYDROGENASE: LDH: 143 U/L (ref 125–245)

## 2015-11-14 LAB — CANCER ANTIGEN 27-29 (PARALLEL TESTING): CA 27.29: 20 U/mL (ref 0–39)

## 2015-11-14 MED ORDER — ZOLEDRONIC ACID 4 MG/5ML IV CONC
3.0000 mg | Freq: Once | INTRAVENOUS | Status: AC
  Administered 2015-11-14: 3 mg via INTRAVENOUS
  Filled 2015-11-14: qty 3.75

## 2015-11-14 MED ORDER — HEPARIN SOD (PORK) LOCK FLUSH 100 UNIT/ML IV SOLN
500.0000 [IU] | Freq: Once | INTRAVENOUS | Status: AC
  Administered 2015-11-14: 500 [IU] via INTRAVENOUS
  Filled 2015-11-14: qty 5

## 2015-11-14 MED ORDER — SODIUM CHLORIDE 0.9% FLUSH
10.0000 mL | INTRAVENOUS | Status: DC | PRN
  Administered 2015-11-14: 10 mL via INTRAVENOUS
  Filled 2015-11-14: qty 10

## 2015-11-14 NOTE — Progress Notes (Signed)
Hematology and Oncology Follow Up Visit  Jacqueline Rivas [id number] [birth year] 82 y.o. 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 is back for followup. She looks real good. She has enjoyed her move to [place]. She had a very good Thanksgiving. It was nice that her family was already down there. She is looking forward to Christmas.  Her myeloma studies have been stable. Her last myeloma studies showed an M spike of 0.2 g/dL. Her IgA Level was 352 mg/dL. Her KAppa light chain was 4.87 mg/dL.   There's been no rashes.  She is complaining of some discomfort under the right arm. She thinks she is feel something.  When we last checked her CA 27.29 in September, it was normal at 15.  So far, there has been no issues with cardiomyopathy. Her cardiac function has almost returned to normal.. Her last echocardiogram done in June showed an ejection fraction of 55%.  Currently, her performance status is ECOG 1.  Medications:  Current outpatient prescriptions:  .  aspirin EC 81 MG tablet, Take 162 mg by mouth daily., Disp: , Rfl:  .  calcium carbonate (OS-CAL) 600 MG TABS tablet, Take 1,200 mg by mouth daily with breakfast., Disp: , Rfl:  .  carvedilol (COREG) 3.125 MG tablet, Take 1 tablet (3.125 mg total) by mouth 2 (two) times daily with a meal., Disp: 180 tablet, Rfl: 3 .  Coenzyme Q10 (CO Q 10) 100 MG CAPS, Take 100 mg by mouth every morning. , Disp: , Rfl:  .  dexamethasone (DECADRON) 4 MG tablet, Take 5 tablets (20 mg total) by mouth once a week., Disp: 120 tablet, Rfl: 3 .  lansoprazole (PREVACID) 30 MG capsule,  Take 30 mg by mouth daily at 12 noon. , Disp: , Rfl:  .  lenalidomide (REVLIMID) 15 MG capsule, Take 1 capsule (15 mg total) by mouth daily. Take for 21 days. Off for 7 days. [id number], Disp: 21 capsule, Rfl: 0 .  lisinopril (PRINIVIL,ZESTRIL) 2.5 MG tablet, Take 1 tablet (2.5 mg total) by mouth daily., Disp: 30 tablet, Rfl: 3 .  magnesium 30 MG tablet, Take 30 mg by mouth every morning., Disp: , Rfl:  .  omeprazole (PRILOSEC) 20 MG capsule, TAKE 1 CAPSULE BY MOUTH EVERY MORNING, Disp: 30 capsule, Rfl: 0 .  polyethylene glycol (MIRALAX / GLYCOLAX) packet, Take 17 g by mouth as needed. , Disp: , Rfl:  .  potassium chloride SA (K-DUR,KLOR-CON) 20 MEQ tablet, Take 1 tablet (20 mEq total) by mouth daily., Disp: 90 tablet, Rfl: 3 .  spironolactone (ALDACTONE) 25 MG tablet, Take 1 tablet (25 mg total) by mouth daily., Disp: 30 tablet, Rfl: 6 .  torsemide (DEMADEX) 20 MG tablet, Take one tab (20mg ) every other day, Disp: 30 tablet, Rfl: 3 No current facility-administered medications for this visit.  Facility-Administered Medications Ordered in Other Visits:  .  sodium chloride 0.9 % injection 10 mL, 10 mL, Intracatheter, PRN, [name], MD, 10 mL at 04/16/14 1332  Allergies: No Known Allergies  Past Medical History, Surgical  history, Social history, and Family History were reviewed and updated.  Review of Systems: As above  Physical Exam:  height is 5\' 4"  (1.626 m) and weight is 148 lb (67.132 kg). Her oral temperature is 98.6 F (37 C). Her blood pressure is 152/74 and her pulse is 82. Her respiration is 18.   Elderly, thin white female in no obvious distress. Head and neck exam shows no ocular or oral lesions. There are no palpable cervical or supraclavicular lymph nodes. Lungs are clear. Cardiac exam regular rate and rhythm. She has an occasional extra beat. She has a 1/6 systolic ejection murmur. Abdomen is soft. She has good bowel sounds. There is no fluid wave. There is no palpable  liver or spleen tip. Extremities shows no clubbing, cyanosis or edema. She has some trace edema in her lower legs. She has some age-related osteoarthritic changes. Breast exam shows left breast with no masses, edema or erythema. There is no left axillary adenopathy. Right chest wall shows a well-healed mastectomy. No right chest wall nodules are noted. There is no right axillary adenopathy. Neurological exam shows no focal neurological deficits.  Lab Results  Component Value Date   WBC 4.7 11/14/2015   HGB 12.2 11/14/2015   HCT 34.8 11/14/2015   MCV 104* 11/14/2015   PLT 195 11/14/2015     Chemistry      Component Value Date/Time   NA 139 11/14/2015 1032   NA 138 06/22/2014 1035   K 4.2 11/14/2015 1032   K 4.6 06/22/2014 1035   CL 109* 11/14/2015 1032   CL 106 06/22/2014 1035   CO2 24 11/14/2015 1032   CO2 23 06/22/2014 1035   BUN 24* 11/14/2015 1032   BUN 37* 06/22/2014 1035   CREATININE 1.3* 11/14/2015 1032   CREATININE 1.48* 06/22/2014 1035      Component Value Date/Time   CALCIUM 9.8 11/14/2015 1032   CALCIUM 9.0 06/22/2014 1035   CALCIUM 11.1* 11/13/2012 1526   ALKPHOS 83 11/14/2015 1032   ALKPHOS 78 06/22/2014 1035   AST 33 11/14/2015 1032   AST 13 06/22/2014 1035   ALT 36 11/14/2015 1032   ALT 16 06/22/2014 1035   BILITOT 0.80 11/14/2015 1032   BILITOT 0.4 06/22/2014 1035         Impression and Plan: Jacqueline Rivas is 82 year old white female. She has both IgA kappa myeloma and stage II breast cancer. She continues to look quite good. She's responding. We will have to see what her monoclonal spike is.   I'm glad that she is now down to [place]. She seems happier. Her family is a lot closer. She had a good Thanksgiving down there. She is looking forward to Christmas.  We will go ahead and give her Zometa today. I think this is reasonable.  We will plan to get her back to see [place] in another 6 weeks  I spent about 30 minutes with her today.     [name], MD 1/26/201712:07 PM

## 2015-11-14 NOTE — Patient Instructions (Signed)

## 2015-11-15 LAB — KAPPA/LAMBDA LIGHT CHAINS
IG KAPPA FREE LIGHT CHAIN: 42.85 mg/L — AB (ref 3.30–19.40)
IG LAMBDA FREE LIGHT CHAIN: 23.68 mg/L (ref 5.71–26.30)
Kappa/Lambda FluidC Ratio: 1.81 — ABNORMAL HIGH (ref 0.26–1.65)

## 2015-11-15 LAB — IGG, IGA, IGM
IGA/IMMUNOGLOBULIN A, SERUM: 316 mg/dL (ref 64–422)
IGG (IMMUNOGLOBIN G), SERUM: 696 mg/dL — AB (ref 700–1600)
IGM (IMMUNOGLOBIN M), SRM: 60 mg/dL (ref 26–217)

## 2015-11-15 LAB — CANCER ANTIGEN 27.29: CA 27.29: 19.2 U/mL (ref 0.0–38.6)

## 2015-11-18 LAB — PROTEIN ELECTROPHORESIS, SERUM, WITH REFLEX
A/G RATIO SPE: 1.2 (ref 0.7–1.7)
ALBUMIN: 3.5 g/dL (ref 2.9–4.4)
ALPHA 1: 0.3 g/dL (ref 0.0–0.4)
ALPHA 2: 0.8 g/dL (ref 0.4–1.0)
BETA: 1 g/dL (ref 0.7–1.3)
Gamma Globulin: 0.8 g/dL (ref 0.4–1.8)
Globulin, Total: 2.9 g/dL (ref 2.2–3.9)
Total Protein: 6.4 g/dL (ref 6.0–8.5)

## 2015-11-20 ENCOUNTER — Telehealth: Payer: Self-pay | Admitting: *Deleted

## 2015-11-20 NOTE — Telephone Encounter (Signed)
-----   Message from [name], MD sent at 11/19/2015  7:10 AM EST ----- Call - No obvious myeloma is detected!!  This is fantastic!! pete

## 2015-12-09 ENCOUNTER — Other Ambulatory Visit: Payer: Self-pay

## 2015-12-09 DIAGNOSIS — C9000 Multiple myeloma not having achieved remission: Secondary | ICD-10-CM

## 2015-12-09 MED ORDER — LENALIDOMIDE 15 MG PO CAPS: 15.0000 mg | ORAL_CAPSULE | Freq: Every day | ORAL | Status: DC

## 2015-12-31 ENCOUNTER — Ambulatory Visit: Payer: Medicare Other

## 2015-12-31 ENCOUNTER — Other Ambulatory Visit: Payer: Medicare Other

## 2015-12-31 ENCOUNTER — Ambulatory Visit: Payer: Medicare Other | Admitting: Hematology & Oncology

## 2016-01-03 ENCOUNTER — Other Ambulatory Visit: Payer: Self-pay | Admitting: *Deleted

## 2016-01-03 DIAGNOSIS — C9000 Multiple myeloma not having achieved remission: Secondary | ICD-10-CM

## 2016-01-03 MED ORDER — LENALIDOMIDE 15 MG PO CAPS: 15.0000 mg | ORAL_CAPSULE | Freq: Every day | ORAL | Status: DC

## 2016-01-13 ENCOUNTER — Encounter: Payer: Self-pay | Admitting: Hematology & Oncology

## 2016-01-13 ENCOUNTER — Ambulatory Visit (HOSPITAL_BASED_OUTPATIENT_CLINIC_OR_DEPARTMENT_OTHER): Payer: Medicare Other | Admitting: Hematology & Oncology

## 2016-01-13 ENCOUNTER — Ambulatory Visit (HOSPITAL_BASED_OUTPATIENT_CLINIC_OR_DEPARTMENT_OTHER): Payer: Medicare Other

## 2016-01-13 ENCOUNTER — Other Ambulatory Visit (HOSPITAL_BASED_OUTPATIENT_CLINIC_OR_DEPARTMENT_OTHER): Payer: Medicare Other

## 2016-01-13 VITALS — BP 151/71 | HR 69 | Temp 98.8°F | Resp 16 | Ht 64.0 in | Wt 152.0 lb

## 2016-01-13 DIAGNOSIS — N183 Chronic kidney disease, stage 3 unspecified: Secondary | ICD-10-CM

## 2016-01-13 DIAGNOSIS — C9001 Multiple myeloma in remission: Secondary | ICD-10-CM | POA: Diagnosis not present

## 2016-01-13 DIAGNOSIS — I427 Cardiomyopathy due to drug and external agent: Secondary | ICD-10-CM | POA: Diagnosis not present

## 2016-01-13 DIAGNOSIS — C50911 Malignant neoplasm of unspecified site of right female breast: Secondary | ICD-10-CM

## 2016-01-13 DIAGNOSIS — C9000 Multiple myeloma not having achieved remission: Secondary | ICD-10-CM | POA: Diagnosis not present

## 2016-01-13 DIAGNOSIS — D630 Anemia in neoplastic disease: Secondary | ICD-10-CM

## 2016-01-13 DIAGNOSIS — C773 Secondary and unspecified malignant neoplasm of axilla and upper limb lymph nodes: Secondary | ICD-10-CM

## 2016-01-13 DIAGNOSIS — I5043 Acute on chronic combined systolic (congestive) and diastolic (congestive) heart failure: Secondary | ICD-10-CM

## 2016-01-13 DIAGNOSIS — I5032 Chronic diastolic (congestive) heart failure: Secondary | ICD-10-CM | POA: Diagnosis not present

## 2016-01-13 LAB — CMP (CANCER CENTER ONLY)
ALK PHOS: 58 U/L (ref 26–84)
ALT: 26 U/L (ref 10–47)
AST: 25 U/L (ref 11–38)
Albumin: 3.6 g/dL (ref 3.3–5.5)
BILIRUBIN TOTAL: 0.8 mg/dL (ref 0.20–1.60)
BUN, Bld: 23 mg/dL — ABNORMAL HIGH (ref 7–22)
CALCIUM: 9 mg/dL (ref 8.0–10.3)
CO2: 24 mEq/L (ref 18–33)
Chloride: 105 mEq/L (ref 98–108)
Creat: 1.4 mg/dl — ABNORMAL HIGH (ref 0.6–1.2)
Glucose, Bld: 96 mg/dL (ref 73–118)
POTASSIUM: 4.2 meq/L (ref 3.3–4.7)
Sodium: 136 mEq/L (ref 128–145)
TOTAL PROTEIN: 6.5 g/dL (ref 6.4–8.1)

## 2016-01-13 LAB — CBC WITH DIFFERENTIAL (CANCER CENTER ONLY)
BASO#: 0.1 10*3/uL (ref 0.0–0.2)
BASO%: 1 % (ref 0.0–2.0)
EOS%: 3.1 % (ref 0.0–7.0)
Eosinophils Absolute: 0.2 10*3/uL (ref 0.0–0.5)
HEMATOCRIT: 36.8 % (ref 34.8–46.6)
HEMOGLOBIN: 12.2 g/dL (ref 11.6–15.9)
LYMPH#: 1 10*3/uL (ref 0.9–3.3)
LYMPH%: 17.1 % (ref 14.0–48.0)
MCH: 35.5 pg — ABNORMAL HIGH (ref 26.0–34.0)
MCHC: 33.2 g/dL (ref 32.0–36.0)
MCV: 107 fL — AB (ref 81–101)
MONO#: 0.8 10*3/uL (ref 0.1–0.9)
MONO%: 13.6 % — ABNORMAL HIGH (ref 0.0–13.0)
NEUT%: 65.2 % (ref 39.6–80.0)
NEUTROS ABS: 3.7 10*3/uL (ref 1.5–6.5)
Platelets: 195 10*3/uL (ref 145–400)
RBC: 3.44 10*6/uL — AB (ref 3.70–5.32)
RDW: 14.2 % (ref 11.1–15.7)
WBC: 5.7 10*3/uL (ref 3.9–10.0)

## 2016-01-13 MED ORDER — DEXAMETHASONE 4 MG PO TABS: ORAL_TABLET | ORAL | Status: DC

## 2016-01-13 MED ORDER — HEPARIN SOD (PORK) LOCK FLUSH 100 UNIT/ML IV SOLN
500.0000 [IU] | Freq: Once | INTRAVENOUS | Status: AC
  Administered 2016-01-13: 500 [IU] via INTRAVENOUS
  Filled 2016-01-13: qty 5

## 2016-01-13 MED ORDER — SODIUM CHLORIDE 0.9% FLUSH
10.0000 mL | INTRAVENOUS | Status: DC | PRN
  Administered 2016-01-13: 10 mL via INTRAVENOUS
  Filled 2016-01-13: qty 10

## 2016-01-13 MED ORDER — ZOLEDRONIC ACID 4 MG/5ML IV CONC
3.0000 mg | Freq: Once | INTRAVENOUS | Status: AC
  Administered 2016-01-13: 3 mg via INTRAVENOUS
  Filled 2016-01-13: qty 3.75

## 2016-01-13 NOTE — Patient Instructions (Signed)

## 2016-01-13 NOTE — Progress Notes (Signed)
Hematology and Oncology Follow Up Visit  Ceresa Mccleskey [id number] [birth year] 82 y.o. 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  Beagan is back for followup. She looks real good. She has enjoyed her move to [place]. Her only complaint right now is that she has some bruises on her legs. This is her lower legs. She notices probably about 2 weeks ago. She denies any trauma to her lower legs. She's had no leg swelling. She's had no rashes.  She is not taking Decadron. She's Taking 20 mg weekly. I told her to try 12 mg weekly.   Her last myeloma studies back in January did not show a monoclonal spike in her serum. Her IgA level was 316 mg/dL. Her Kappa Lightchain was 4.3 mg/dL. All this appears to be stable.  Her last echocardiogram done in June showed an ejection fraction of 55%. I'm not sure if she sees cardiology any longer.  Currently, her performance status is ECOG 1.  Medications:  Current outpatient prescriptions:  .  aspirin EC 81 MG tablet, Take 162 mg by mouth daily., Disp: , Rfl:  .  calcium carbonate (OS-CAL) 600 MG TABS tablet, Take 1,200 mg by mouth daily with breakfast., Disp: , Rfl:  .  carvedilol (COREG) 3.125 MG tablet, Take 1 tablet (3.125 mg total) by mouth 2 (two) times daily with a meal., Disp: 180 tablet, Rfl: 3 .  Coenzyme Q10 (CO Q 10) 100 MG CAPS, Take 100 mg by mouth every morning. , Disp: , Rfl:  .  dexamethasone (DECADRON) 4 MG tablet, Take 5 tablets (20 mg total) by mouth once a week., Disp: 120 tablet, Rfl: 3 .  lansoprazole (PREVACID) 30 MG capsule, Take 30 mg by mouth  daily at 12 noon. , Disp: , Rfl:  .  lenalidomide (REVLIMID) 15 MG capsule, Take 1 capsule (15 mg total) by mouth daily. Take for 21 days. Off for 7 days. [id number], Disp: 21 capsule, Rfl: 0 .  lisinopril (PRINIVIL,ZESTRIL) 2.5 MG tablet, Take 1 tablet (2.5 mg total) by mouth daily., Disp: 30 tablet, Rfl: 3 .  magnesium 30 MG tablet, Take 30 mg by mouth every morning., Disp: , Rfl:  .  omeprazole (PRILOSEC) 20 MG capsule, TAKE 1 CAPSULE BY MOUTH EVERY MORNING, Disp: 30 capsule, Rfl: 0 .  polyethylene glycol (MIRALAX / GLYCOLAX) packet, Take 17 g by mouth as needed. , Disp: , Rfl:  .  potassium chloride SA (K-DUR,KLOR-CON) 20 MEQ tablet, Take 1 tablet (20 mEq total) by mouth daily., Disp: 90 tablet, Rfl: 3 .  spironolactone (ALDACTONE) 25 MG tablet, Take 1 tablet (25 mg total) by mouth daily., Disp: 30 tablet, Rfl: 6 .  torsemide (DEMADEX) 20 MG tablet, Take one tab (20mg ) every other day, Disp: 30 tablet, Rfl: 3 No current facility-administered medications for this visit.  Facility-Administered Medications Ordered in Other Visits:  .  sodium chloride 0.9 % injection 10 mL, 10 mL, Intracatheter, PRN, [name], MD, 10 mL at 04/16/14 1332 .  zolendronic acid (ZOMETA) 3  mg in sodium chloride 0.9 % 100 mL IVPB, 3 mg, Intravenous, Once, [name], MD  Allergies: No Known Allergies  Past Medical History, Surgical history, Social history, and Family History were reviewed and updated.  Review of Systems: As above  Physical Exam:  height is 5\' 4"  (1.626 m) and weight is 152 lb (68.947 kg). Her oral temperature is 98.8 F (37.1 C). Her blood pressure is 151/71 and her pulse is 69. Her respiration is 16.   Elderly, thin white female in no obvious distress. Head and neck exam shows no ocular or oral lesions. There are no palpable cervical or supraclavicular lymph nodes. Lungs are clear. Cardiac exam regular rate and rhythm. She has an occasional extra beat. She has a 1/6 systolic  ejection murmur. Abdomen is soft. She has good bowel sounds. There is no fluid wave. There is no palpable liver or spleen tip. Extremities shows no clubbing, cyanosis or edema. She has some trace edema in her lower legs. She has some age-related osteoarthritic changes. Breast exam shows left breast with no masses, edema or erythema. There is no left axillary adenopathy. Right chest wall shows a well-healed mastectomy. No right chest wall nodules are noted. There is no right axillary adenopathy. Neurological exam shows no focal neurological deficits.  Lab Results  Component Value Date   WBC 5.7 01/13/2016   HGB 12.2 01/13/2016   HCT 36.8 01/13/2016   MCV 107* 01/13/2016   PLT 195 01/13/2016     Chemistry      Component Value Date/Time   NA 136 01/13/2016 1301   NA 138 06/22/2014 1035   K 4.2 01/13/2016 1301   K 4.6 06/22/2014 1035   CL 105 01/13/2016 1301   CL 106 06/22/2014 1035   CO2 24 01/13/2016 1301   CO2 23 06/22/2014 1035   BUN 23* 01/13/2016 1301   BUN 37* 06/22/2014 1035   CREATININE 1.4* 01/13/2016 1301   CREATININE 1.48* 06/22/2014 1035      Component Value Date/Time   CALCIUM 9.0 01/13/2016 1301   CALCIUM 9.0 06/22/2014 1035   CALCIUM 11.1* 11/13/2012 1526   ALKPHOS 58 01/13/2016 1301   ALKPHOS 78 06/22/2014 1035   AST 25 01/13/2016 1301   AST 13 06/22/2014 1035   ALT 26 01/13/2016 1301   ALT 16 06/22/2014 1035   BILITOT 0.80 01/13/2016 1301   BILITOT 0.4 06/22/2014 1035         Impression and Plan: Ms. Guerino is 82 year old white female. She has both IgA kappa myeloma and stage II breast cancer. She continues to look quite good. She's responding. We will have to see what her monoclonal spike is.   I'm glad that she is now down to [place]. She seems happier. Her family is a lot closer.   It is reassuring that her myeloma studies are baseline for her. I do not see think that is "active".  We will go ahead with the Zometa today.  I think we can get her  back every 2 months. I think this is very reasonable.  I spent about 30 minutes with her today.     [name], MD 3/27/20172:51 PM

## 2016-01-14 LAB — KAPPA/LAMBDA LIGHT CHAINS
IG KAPPA FREE LIGHT CHAIN: 42.7 mg/L — AB (ref 3.30–19.40)
IG LAMBDA FREE LIGHT CHAIN: 24.1 mg/L (ref 5.71–26.30)
KAPPA/LAMBDA FLC RATIO: 1.77 — AB (ref 0.26–1.65)

## 2016-01-14 LAB — CANCER ANTIGEN 27.29: CAN 27.29: 20.7 U/mL (ref 0.0–38.6)

## 2016-01-14 LAB — CANCER ANTIGEN 27-29 (PARALLEL TESTING): CA 27.29: 17 U/mL (ref <38)

## 2016-01-14 LAB — LACTATE DEHYDROGENASE: LDH: 145 U/L (ref 125–245)

## 2016-01-15 LAB — MULTIPLE MYELOMA PANEL, SERUM
ALBUMIN SERPL ELPH-MCNC: 3.5 g/dL (ref 2.9–4.4)
ALPHA 1: 0.3 g/dL (ref 0.0–0.4)
ALPHA2 GLOB SERPL ELPH-MCNC: 0.8 g/dL (ref 0.4–1.0)
Albumin/Glob SerPl: 1.3 (ref 0.7–1.7)
B-Globulin SerPl Elph-Mcnc: 1 g/dL (ref 0.7–1.3)
GLOBULIN, TOTAL: 2.8 g/dL (ref 2.2–3.9)
Gamma Glob SerPl Elph-Mcnc: 0.8 g/dL (ref 0.4–1.8)
IGA/IMMUNOGLOBULIN A, SERUM: 296 mg/dL (ref 64–422)
IGM (IMMUNOGLOBIN M), SRM: 63 mg/dL (ref 26–217)
IgG, Qn, Serum: 691 mg/dL — ABNORMAL LOW (ref 700–1600)
TOTAL PROTEIN: 6.3 g/dL (ref 6.0–8.5)

## 2016-01-17 ENCOUNTER — Telehealth: Payer: Self-pay | Admitting: Nurse Practitioner

## 2016-01-17 NOTE — Telephone Encounter (Addendum)
Spoke to son and he verbalized understanding.   ----- Message from [name], MD sent at 01/15/2016  4:21 PM EDT ----- Call - NO obvious myeloma detected!!!  You still must keep on Revlimid!!!  pete

## 2016-01-29 ENCOUNTER — Ambulatory Visit: Payer: Medicare Other | Admitting: Hematology & Oncology

## 2016-01-29 ENCOUNTER — Other Ambulatory Visit: Payer: Medicare Other

## 2016-02-05 ENCOUNTER — Other Ambulatory Visit: Payer: Self-pay | Admitting: *Deleted

## 2016-02-05 DIAGNOSIS — C9000 Multiple myeloma not having achieved remission: Secondary | ICD-10-CM

## 2016-02-05 MED ORDER — LENALIDOMIDE 15 MG PO CAPS
15.0000 mg | ORAL_CAPSULE | Freq: Every day | ORAL | Status: DC
Start: 2016-02-05 — End: 2016-03-18

## 2016-03-18 ENCOUNTER — Other Ambulatory Visit: Payer: Self-pay | Admitting: *Deleted

## 2016-03-18 ENCOUNTER — Other Ambulatory Visit (HOSPITAL_BASED_OUTPATIENT_CLINIC_OR_DEPARTMENT_OTHER): Payer: Medicare Other

## 2016-03-18 ENCOUNTER — Ambulatory Visit (HOSPITAL_BASED_OUTPATIENT_CLINIC_OR_DEPARTMENT_OTHER): Payer: Medicare Other

## 2016-03-18 ENCOUNTER — Ambulatory Visit (HOSPITAL_BASED_OUTPATIENT_CLINIC_OR_DEPARTMENT_OTHER): Payer: Medicare Other | Admitting: Hematology & Oncology

## 2016-03-18 ENCOUNTER — Encounter: Payer: Self-pay | Admitting: Hematology & Oncology

## 2016-03-18 VITALS — BP 148/70 | HR 70 | Temp 98.2°F | Resp 16 | Ht 64.0 in | Wt 153.0 lb

## 2016-03-18 DIAGNOSIS — I5043 Acute on chronic combined systolic (congestive) and diastolic (congestive) heart failure: Secondary | ICD-10-CM | POA: Diagnosis not present

## 2016-03-18 DIAGNOSIS — C9000 Multiple myeloma not having achieved remission: Secondary | ICD-10-CM

## 2016-03-18 DIAGNOSIS — Z853 Personal history of malignant neoplasm of breast: Secondary | ICD-10-CM

## 2016-03-18 DIAGNOSIS — D630 Anemia in neoplastic disease: Secondary | ICD-10-CM

## 2016-03-18 DIAGNOSIS — C9001 Multiple myeloma in remission: Secondary | ICD-10-CM

## 2016-03-18 DIAGNOSIS — N183 Chronic kidney disease, stage 3 unspecified: Secondary | ICD-10-CM

## 2016-03-18 LAB — CMP (CANCER CENTER ONLY)
ALBUMIN: 3.3 g/dL (ref 3.3–5.5)
ALT(SGPT): 25 U/L (ref 10–47)
AST: 23 U/L (ref 11–38)
Alkaline Phosphatase: 60 U/L (ref 26–84)
BILIRUBIN TOTAL: 0.9 mg/dL (ref 0.20–1.60)
BUN, Bld: 19 mg/dL (ref 7–22)
CALCIUM: 8.9 mg/dL (ref 8.0–10.3)
CHLORIDE: 103 meq/L (ref 98–108)
CO2: 25 meq/L (ref 18–33)
Creat: 1.3 mg/dl — ABNORMAL HIGH (ref 0.6–1.2)
Glucose, Bld: 99 mg/dL (ref 73–118)
POTASSIUM: 4.2 meq/L (ref 3.3–4.7)
Sodium: 141 mEq/L (ref 128–145)
Total Protein: 6.4 g/dL (ref 6.4–8.1)

## 2016-03-18 LAB — CBC WITH DIFFERENTIAL (CANCER CENTER ONLY)
BASO#: 0.1 10*3/uL (ref 0.0–0.2)
BASO%: 0.9 % (ref 0.0–2.0)
EOS%: 3 % (ref 0.0–7.0)
Eosinophils Absolute: 0.2 10*3/uL (ref 0.0–0.5)
HEMATOCRIT: 36.5 % (ref 34.8–46.6)
HGB: 12.3 g/dL (ref 11.6–15.9)
LYMPH#: 0.8 10*3/uL — AB (ref 0.9–3.3)
LYMPH%: 14.3 % (ref 14.0–48.0)
MCH: 35.4 pg — ABNORMAL HIGH (ref 26.0–34.0)
MCHC: 33.7 g/dL (ref 32.0–36.0)
MCV: 105 fL — AB (ref 81–101)
MONO#: 0.7 10*3/uL (ref 0.1–0.9)
MONO%: 13.2 % — ABNORMAL HIGH (ref 0.0–13.0)
NEUT%: 68.6 % (ref 39.6–80.0)
NEUTROS ABS: 3.6 10*3/uL (ref 1.5–6.5)
PLATELETS: 150 10*3/uL (ref 145–400)
RBC: 3.47 10*6/uL — ABNORMAL LOW (ref 3.70–5.32)
RDW: 14 % (ref 11.1–15.7)
WBC: 5.3 10*3/uL (ref 3.9–10.0)

## 2016-03-18 LAB — LACTATE DEHYDROGENASE: LDH: 134 U/L (ref 125–245)

## 2016-03-18 MED ORDER — SODIUM CHLORIDE 0.9% FLUSH
10.0000 mL | INTRAVENOUS | Status: DC | PRN
  Administered 2016-03-18: 10 mL via INTRAVENOUS
  Filled 2016-03-18: qty 10

## 2016-03-18 MED ORDER — ZOLEDRONIC ACID 4 MG/5ML IV CONC
3.0000 mg | Freq: Once | INTRAVENOUS | Status: AC
  Administered 2016-03-18: 3 mg via INTRAVENOUS
  Filled 2016-03-18: qty 3.75

## 2016-03-18 MED ORDER — HEPARIN SOD (PORK) LOCK FLUSH 100 UNIT/ML IV SOLN
500.0000 [IU] | Freq: Once | INTRAVENOUS | Status: AC
  Administered 2016-03-18: 500 [IU] via INTRAVENOUS
  Filled 2016-03-18: qty 5

## 2016-03-18 MED ORDER — DEXAMETHASONE 4 MG PO TABS: ORAL_TABLET | ORAL | Status: DC

## 2016-03-18 MED ORDER — SODIUM CHLORIDE 0.9 % IV SOLN
Freq: Once | INTRAVENOUS | Status: AC
  Administered 2016-03-18: 11:00:00 via INTRAVENOUS

## 2016-03-18 MED ORDER — LENALIDOMIDE 15 MG PO CAPS: 15.0000 mg | ORAL_CAPSULE | Freq: Every day | ORAL | Status: DC

## 2016-03-18 MED FILL — DEXAMETHASONE 4 MG TABLET: 4 | 84 days supply | Qty: 36 | Fill #0

## 2016-03-18 NOTE — Patient Instructions (Signed)

## 2016-03-18 NOTE — Progress Notes (Signed)
Hematology and Oncology Follow Up Visit  Jacqueline Rivas [id number] September 20, 1932 82 y.o. 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 is back for followup. She looks real good. She has enjoyed her move to [place]. She still is not exercising that much. I told that she really has to get out and exercise a little bit more. She has a pool at her complex. She really should do some water aerobics. Her son totally agrees with me.  Her appetite has been good. She had a good Easter. She had a good Mother's Day. She had a good Memorial Day weekend.  Her myeloma studies have been fantastic. She did not have an obvious monoclonal spike with her last myeloma studies back in March. Her IgA level was Rivas steady at 691. Her Kappa Lightchain was 4.3 mg/dL.  As far as her breast cancers concerned, this is not been a problem for her.  Her heart seemed to be doing pretty well. I measure she really sees the cardiologist any longer.  She says her vision is not doing as well. She has some ophthalmologists that she can see down in [place]. Her son will make sure this.  Currently, her performance status is ECOG 1.  Medications:  Current outpatient prescriptions:  .  aspirin EC 81 MG tablet, Take 162 mg by mouth daily., Disp: , Rfl:  .  calcium carbonate (OS-CAL) 600 MG TABS tablet, Take 1,200 mg by mouth daily with breakfast., Disp: , Rfl:  .  carvedilol (COREG) 3.125 MG tablet, Take 1 tablet (3.125 mg total) by mouth 2 (two) times daily with a meal., Disp: 180 tablet, Rfl: 3 .  Coenzyme Q10 (CO Q 10) 100 MG  CAPS, Take 100 mg by mouth every morning. , Disp: , Rfl:  .  dexamethasone (DECADRON) 4 MG tablet, Take 3 pills weekly., Disp: 120 tablet, Rfl: 3 .  lansoprazole (PREVACID) 30 MG capsule, Take 30 mg by mouth daily at 12 noon. , Disp: , Rfl:  .  lenalidomide (REVLIMID) 15 MG capsule, Take 1 capsule (15 mg total) by mouth daily. Take for 21 days. Off for 7 days. [id number], Disp: 21 capsule, Rfl: 0 .  lisinopril (PRINIVIL,ZESTRIL) 2.5 MG tablet, Take 1 tablet (2.5 mg total) by mouth daily., Disp: 30 tablet, Rfl: 3 .  magnesium 30 MG tablet, Take 30 mg by mouth every morning., Disp: , Rfl:  .  omeprazole (PRILOSEC) 20 MG capsule, TAKE 1 CAPSULE BY MOUTH EVERY MORNING, Disp: 30 capsule, Rfl: 0 .  polyethylene glycol (MIRALAX / GLYCOLAX) packet, Take 17 g by mouth as needed. , Disp: , Rfl:  .  potassium chloride SA (K-DUR,KLOR-CON) 20 MEQ tablet, Take 1 tablet (20 mEq total) by mouth daily., Disp: 90 tablet, Rfl: 3 .  spironolactone (ALDACTONE) 25 MG tablet, Take 1 tablet (25 mg total) by mouth daily., Disp: 30 tablet, Rfl: 6 .  torsemide (DEMADEX) 20 MG tablet, Take one tab (20mg ) every other day, Disp: 30 tablet, Rfl: 3 No current facility-administered medications for this  visit.  Facility-Administered Medications Ordered in Other Visits:  .  sodium chloride 0.9 % injection 10 mL, 10 mL, Intracatheter, PRN, [name], MD, 10 mL at 04/16/14 1332  Allergies: No Known Allergies  Past Medical History, Surgical history, Social history, and Family History were reviewed and updated.  Review of Systems: As above  Physical Exam:  height is 5\' 4"  (1.626 m) and weight is 153 lb (69.4 kg). Her oral temperature is 98.2 F (36.8 C). Her blood pressure is 148/70 and her pulse is 70. Her respiration is 16.   Elderly, thin white female in no obvious distress. Head and neck exam shows no ocular or oral lesions. There are no palpable cervical or supraclavicular lymph nodes. Lungs are clear. Cardiac exam  regular rate and rhythm. She has an occasional extra beat. She has a 1/6 systolic ejection murmur. Abdomen is soft. She has good bowel sounds. There is no fluid wave. There is no palpable liver or spleen tip. Extremities shows no clubbing, cyanosis or edema. She has some trace edema in her lower legs. She has some age-related osteoarthritic changes. Breast exam shows left breast with no masses, edema or erythema. There is no left axillary adenopathy. Right chest wall shows a well-healed mastectomy. No right chest wall nodules are noted. There is no right axillary adenopathy. Neurological exam shows no focal neurological deficits.  Lab Results  Component Value Date   WBC 5.3 03/18/2016   HGB 12.3 03/18/2016   HCT 36.5 03/18/2016   MCV 105* 03/18/2016   PLT 150 03/18/2016     Chemistry      Component Value Date/Time   NA 141 03/18/2016 0949   NA 138 06/22/2014 1035   K 4.2 03/18/2016 0949   K 4.6 06/22/2014 1035   CL 103 03/18/2016 0949   CL 106 06/22/2014 1035   CO2 25 03/18/2016 0949   CO2 23 06/22/2014 1035   BUN 19 03/18/2016 0949   BUN 37* 06/22/2014 1035   CREATININE 1.3* 03/18/2016 0949   CREATININE 1.48* 06/22/2014 1035      Component Value Date/Time   CALCIUM 8.9 03/18/2016 0949   CALCIUM 9.0 06/22/2014 1035   CALCIUM 11.1* 11/13/2012 1526   ALKPHOS 60 03/18/2016 0949   ALKPHOS 78 06/22/2014 1035   AST 23 03/18/2016 0949   AST 13 06/22/2014 1035   ALT 25 03/18/2016 0949   ALT 16 06/22/2014 1035   BILITOT 0.90 03/18/2016 0949   BILITOT 0.4 06/22/2014 1035         Impression and Plan: Jacqueline Rivas is 82 year old white female. She has both IgA kappa myeloma and stage II breast cancer. She continues to look quite good. She's responding. She has no obvious monoclonal spike with her last studies.   We are now going on over 3 years we first saw her.  Again, I do not see any issues with respect to her breast cancer. The myeloma is doing quite well. She is doing well on  the Revlimid.  We will go ahead and plan to get her back in 2 more months. She will get her Zometa today. She does not need Aranesp.  I spent about 30 minutes with she and her son.     [name], MD 5/31/201710:49 AM

## 2016-03-19 LAB — IGG, IGA, IGM
IGA/IMMUNOGLOBULIN A, SERUM: 271 mg/dL (ref 64–422)
IgG, Qn, Serum: 642 mg/dL — ABNORMAL LOW (ref 700–1600)
IgM, Qn, Serum: 63 mg/dL (ref 26–217)

## 2016-03-19 LAB — KAPPA/LAMBDA LIGHT CHAINS
IG KAPPA FREE LIGHT CHAIN: 39.45 mg/L — AB (ref 3.30–19.40)
Ig Lambda Free Light Chain: 22.76 mg/L (ref 5.71–26.30)
Kappa/Lambda FluidC Ratio: 1.73 — ABNORMAL HIGH (ref 0.26–1.65)

## 2016-03-20 LAB — PROTEIN ELECTROPHORESIS, SERUM, WITH REFLEX
A/G RATIO SPE: 1.3 (ref 0.7–1.7)
Albumin: 3.2 g/dL (ref 2.9–4.4)
Alpha 1: 0.2 g/dL (ref 0.0–0.4)
Alpha 2: 0.7 g/dL (ref 0.4–1.0)
Beta: 0.8 g/dL (ref 0.7–1.3)
GLOBULIN, TOTAL: 2.5 g/dL (ref 2.2–3.9)
Gamma Globulin: 0.8 g/dL (ref 0.4–1.8)
Total Protein: 5.7 g/dL — ABNORMAL LOW (ref 6.0–8.5)

## 2016-03-23 ENCOUNTER — Telehealth: Payer: Self-pay | Admitting: *Deleted

## 2016-03-23 NOTE — Telephone Encounter (Addendum)
Patient aware of results.   ----- Message from [name], MD sent at 03/20/2016  5:04 PM EDT ----- Call - NO myeloma protein is found!!!  This is fantastic!!!!  pete

## 2016-04-21 ENCOUNTER — Other Ambulatory Visit: Payer: Self-pay | Admitting: Nurse Practitioner

## 2016-04-28 ENCOUNTER — Other Ambulatory Visit: Payer: Self-pay | Admitting: *Deleted

## 2016-04-28 DIAGNOSIS — C9000 Multiple myeloma not having achieved remission: Secondary | ICD-10-CM

## 2016-04-28 MED ORDER — LENALIDOMIDE 15 MG PO CAPS: 15.0000 mg | ORAL_CAPSULE | Freq: Every day | ORAL | Status: DC

## 2016-05-19 ENCOUNTER — Encounter: Payer: Self-pay | Admitting: Hematology & Oncology

## 2016-05-19 ENCOUNTER — Ambulatory Visit (HOSPITAL_BASED_OUTPATIENT_CLINIC_OR_DEPARTMENT_OTHER): Payer: Medicare Other | Admitting: Hematology & Oncology

## 2016-05-19 ENCOUNTER — Ambulatory Visit (HOSPITAL_BASED_OUTPATIENT_CLINIC_OR_DEPARTMENT_OTHER): Payer: Medicare Other

## 2016-05-19 ENCOUNTER — Other Ambulatory Visit (HOSPITAL_BASED_OUTPATIENT_CLINIC_OR_DEPARTMENT_OTHER): Payer: Medicare Other

## 2016-05-19 VITALS — BP 128/67 | HR 73 | Temp 97.5°F | Resp 18 | Ht 64.0 in | Wt 150.0 lb

## 2016-05-19 DIAGNOSIS — C9000 Multiple myeloma not having achieved remission: Secondary | ICD-10-CM | POA: Diagnosis not present

## 2016-05-19 DIAGNOSIS — D630 Anemia in neoplastic disease: Secondary | ICD-10-CM

## 2016-05-19 DIAGNOSIS — Z171 Estrogen receptor negative status [ER-]: Secondary | ICD-10-CM | POA: Diagnosis not present

## 2016-05-19 DIAGNOSIS — I429 Cardiomyopathy, unspecified: Secondary | ICD-10-CM

## 2016-05-19 DIAGNOSIS — I5043 Acute on chronic combined systolic (congestive) and diastolic (congestive) heart failure: Secondary | ICD-10-CM | POA: Diagnosis not present

## 2016-05-19 DIAGNOSIS — C9001 Multiple myeloma in remission: Secondary | ICD-10-CM | POA: Diagnosis not present

## 2016-05-19 DIAGNOSIS — C50911 Malignant neoplasm of unspecified site of right female breast: Secondary | ICD-10-CM

## 2016-05-19 DIAGNOSIS — C773 Secondary and unspecified malignant neoplasm of axilla and upper limb lymph nodes: Secondary | ICD-10-CM

## 2016-05-19 DIAGNOSIS — N183 Chronic kidney disease, stage 3 unspecified: Secondary | ICD-10-CM

## 2016-05-19 LAB — CBC WITH DIFFERENTIAL (CANCER CENTER ONLY)
BASO#: 0.1 10*3/uL (ref 0.0–0.2)
BASO%: 1.3 % (ref 0.0–2.0)
EOS%: 1.6 % (ref 0.0–7.0)
Eosinophils Absolute: 0.1 10*3/uL (ref 0.0–0.5)
HEMATOCRIT: 37.6 % (ref 34.8–46.6)
HEMOGLOBIN: 12.4 g/dL (ref 11.6–15.9)
LYMPH#: 0.7 10*3/uL — AB (ref 0.9–3.3)
LYMPH%: 15.4 % (ref 14.0–48.0)
MCH: 35 pg — ABNORMAL HIGH (ref 26.0–34.0)
MCHC: 33 g/dL (ref 32.0–36.0)
MCV: 106 fL — AB (ref 81–101)
MONO#: 0.7 10*3/uL (ref 0.1–0.9)
MONO%: 15.8 % — ABNORMAL HIGH (ref 0.0–13.0)
NEUT%: 65.9 % (ref 39.6–80.0)
NEUTROS ABS: 3 10*3/uL (ref 1.5–6.5)
Platelets: 191 10*3/uL (ref 145–400)
RBC: 3.54 10*6/uL — AB (ref 3.70–5.32)
RDW: 14.1 % (ref 11.1–15.7)
WBC: 4.5 10*3/uL (ref 3.9–10.0)

## 2016-05-19 LAB — CMP (CANCER CENTER ONLY)
ALBUMIN: 3.3 g/dL (ref 3.3–5.5)
ALK PHOS: 60 U/L (ref 26–84)
ALT: 28 U/L (ref 10–47)
AST: 28 U/L (ref 11–38)
BILIRUBIN TOTAL: 1 mg/dL (ref 0.20–1.60)
BUN, Bld: 22 mg/dL (ref 7–22)
CALCIUM: 8.5 mg/dL (ref 8.0–10.3)
CO2: 27 mEq/L (ref 18–33)
Chloride: 107 mEq/L (ref 98–108)
Creat: 1.4 mg/dl — ABNORMAL HIGH (ref 0.6–1.2)
GLUCOSE: 94 mg/dL (ref 73–118)
POTASSIUM: 4.4 meq/L (ref 3.3–4.7)
Sodium: 135 mEq/L (ref 128–145)
TOTAL PROTEIN: 6.3 g/dL — AB (ref 6.4–8.1)

## 2016-05-19 MED ORDER — SODIUM CHLORIDE 0.9% FLUSH
10.0000 mL | INTRAVENOUS | Status: DC | PRN
  Administered 2016-05-19: 10 mL via INTRAVENOUS
  Filled 2016-05-19: qty 10

## 2016-05-19 MED ORDER — HEPARIN SOD (PORK) LOCK FLUSH 100 UNIT/ML IV SOLN
500.0000 [IU] | Freq: Once | INTRAVENOUS | Status: AC
  Administered 2016-05-19: 500 [IU] via INTRAVENOUS
  Filled 2016-05-19: qty 5

## 2016-05-19 MED ORDER — SODIUM CHLORIDE 0.9 % IV SOLN
INTRAVENOUS | Status: DC
  Administered 2016-05-19: 11:00:00 via INTRAVENOUS

## 2016-05-19 MED ORDER — ZOLEDRONIC ACID 4 MG/5ML IV CONC
3.0000 mg | Freq: Once | INTRAVENOUS | Status: AC
  Administered 2016-05-19: 3 mg via INTRAVENOUS
  Filled 2016-05-19: qty 3.75

## 2016-05-19 NOTE — Progress Notes (Signed)
Hematology and Oncology Follow Up Visit  Jacqueline Rivas [id number] [birth year] 82 y.o. 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 is back for followup. She looks real good. As always, she looks very tan. She and her whole family were at the [facility] over the weekend. She then was at one of her son's house and hung out at the pool.  She says she got a little dehydrated. She just does not drink a lot of water  Her last monoclonal spike did not show any obvious myeloma level. Her IgA level was  271 mg/dL.  Her Kappa Lightchain is 4 mg/dL.  She's had no problems with cough or shortness of breath. There's been no bony pain. She does have some osteoporotic issues.  She's had no fever. She's had no change in bowel or bladder habits. She's had no fever.  She does have some cardiomyopathy but this appears to be resolved clinically. She's had no leg swelling.  Currently, her performance status is Jacqueline Rivas.  Medications:  Current Outpatient Prescriptions:  .  aspirin EC 81 MG tablet, Take 162 mg by mouth daily., Disp: , Rfl:  .  calcium carbonate (OS-CAL) 600 MG TABS tablet, Take Rivas,200 mg by mouth daily with breakfast., Disp: , Rfl:  .  carvedilol (COREG) 3.125 MG tablet, Take Rivas tablet (3.125 mg total) by mouth 2 (two) times daily with a meal., Disp: 180 tablet, Rfl: 3 .  Coenzyme Q10 (CO Q 10) 100 MG CAPS, Take 100 mg by mouth every morning. , Disp: , Rfl:  .  dexamethasone (DECADRON) 4 MG tablet, Take 3 pills weekly., Disp: 120 tablet, Rfl: 3 .  lansoprazole (PREVACID) 30 MG capsule, Take 30 mg by mouth  daily at 12 noon. , Disp: , Rfl:  .  lenalidomide (REVLIMID) 15 MG capsule, Take Rivas capsule (15 mg total) by mouth daily. Take for 21 days. Off for 7 days. Auth# [id number], Disp: 21 capsule, Rfl: 0 .  lisinopril (PRINIVIL,ZESTRIL) 2.5 MG tablet, Take Rivas tablet (2.5 mg total) by mouth daily., Disp: 30 tablet, Rfl: 3 .  magnesium 30 MG tablet, Take 30 mg by mouth every morning., Disp: , Rfl:  .  omeprazole (PRILOSEC) 20 MG capsule, TAKE Rivas CAPSULE BY MOUTH EVERY MORNING, Disp: 30 capsule, Rfl: 0 .  polyethylene glycol (MIRALAX / GLYCOLAX) packet, Take 17 g by mouth as needed. , Disp: , Rfl:  .  potassium chloride SA (K-DUR,KLOR-CON) 20 MEQ tablet, Take Rivas tablet (20 mEq total) by mouth daily., Disp: 90 tablet, Rfl: 3 .  spironolactone (ALDACTONE) 25 MG tablet, Take Rivas tablet (25 mg total) by mouth daily., Disp: 30 tablet, Rfl: 6 .  torsemide (DEMADEX) 20 MG tablet, Take one tab (20mg ) every other day, Disp: 30 tablet, Rfl: 3 No current facility-administered medications for this visit.   Facility-Administered Medications Ordered in Other Visits:  .  0.9 %  sodium chloride infusion, , Intravenous, Continuous, [name], MD, Last Rate: 10 mL/hr at 05/19/16 1051 .  sodium chloride  0.9 % injection 10 mL, 10 mL, Intracatheter, PRN, [name], MD, 10 mL at 04/16/14 1332 .  zolendronic acid (ZOMETA) 3 mg in sodium chloride 0.9 % 100 mL IVPB, 3 mg, Intravenous, Once, [name], MD, Last Rate: 415 mL/hr at 05/19/16 1057, 3 mg at 05/19/16 1057  Allergies: No Known Allergies  Past Medical History, Surgical history, Social history, and Family History were reviewed and updated.  Review of Systems: As above  Physical Exam:  height is 5\' 4"  (Rivas.626 m) and weight is 150 lb (68 kg). Her oral temperature is 97.5 F (36.4 C). Her blood pressure is 128/67 and her pulse is 73. Her respiration is 18.   Elderly, thin white female in no obvious distress. Head and neck exam shows no ocular or oral lesions.  There are no palpable cervical or supraclavicular lymph nodes. Lungs are clear. Cardiac exam regular rate and rhythm. She has an occasional extra beat. She has a Rivas/6 systolic ejection murmur. Abdomen is soft. She has good bowel sounds. There is no fluid wave. There is no palpable liver or spleen tip. Extremities shows no clubbing, cyanosis or edema. She has some trace edema in her lower legs. She has some age-related osteoarthritic changes. Breast exam shows left breast with no masses, edema or erythema. There is no left axillary adenopathy. Right chest wall shows a well-healed mastectomy. No right chest wall nodules are noted. There is no right axillary adenopathy. Neurological exam shows no focal neurological deficits.  Lab Results  Component Value Date   WBC 4.5 05/19/2016   HGB 12.4 05/19/2016   HCT 37.6 05/19/2016   MCV 106 (H) 05/19/2016   PLT 191 05/19/2016     Chemistry      Component Value Date/Time   NA 135 05/19/2016 0919   K 4.4 05/19/2016 0919   CL 107 05/19/2016 0919   CO2 27 05/19/2016 0919   BUN 22 05/19/2016 0919   CREATININE Rivas.4 (H) 05/19/2016 0919      Component Value Date/Time   CALCIUM 8.5 05/19/2016 0919   CALCIUM 11.Rivas (H) 11/13/2012 1526   ALKPHOS 60 05/19/2016 0919   AST 28 05/19/2016 0919   ALT 28 05/19/2016 0919   BILITOT Rivas.00 05/19/2016 0919         Impression and Plan: Jacqueline Rivas is 82 year old white female. She has both IgA kappa myeloma and stage II breast cancer. She continues to look quite good. She's responding. She has no obvious monoclonal spike with her last studies.   We are now going on over 3 years we first saw her.  Again, I do not see any issues with respect to her breast cancer. The myeloma is doing quite well. She is doing well on the Revlimid.  We will go ahead and plan to get her back in 2 more months. She will get her Zometa today. She does not need Aranesp.  I spent about 30 minutes with she and her son.     [name], MD 8/Rivas/201711:04 AM

## 2016-05-19 NOTE — Patient Instructions (Signed)

## 2016-05-20 LAB — IGG, IGA, IGM
IGG (IMMUNOGLOBIN G), SERUM: 621 mg/dL — AB (ref 700–1600)
IgA, Qn, Serum: 248 mg/dL (ref 64–422)
IgM, Qn, Serum: 64 mg/dL (ref 26–217)

## 2016-05-20 LAB — KAPPA/LAMBDA LIGHT CHAINS
IG KAPPA FREE LIGHT CHAIN: 33 mg/L — AB (ref 3.3–19.4)
Ig Lambda Free Light Chain: 20.8 mg/L (ref 5.7–26.3)
Kappa/Lambda FluidC Ratio: 1.59 (ref 0.26–1.65)

## 2016-05-21 LAB — PROTEIN ELECTROPHORESIS, SERUM, WITH REFLEX
A/G RATIO SPE: 1.4 (ref 0.7–1.7)
Albumin: 3.5 g/dL (ref 2.9–4.4)
Alpha 1: 0.2 g/dL (ref 0.0–0.4)
Alpha 2: 0.8 g/dL (ref 0.4–1.0)
BETA: 0.9 g/dL (ref 0.7–1.3)
GAMMA GLOBULIN: 0.6 g/dL (ref 0.4–1.8)
GLOBULIN, TOTAL: 2.5 g/dL (ref 2.2–3.9)
TOTAL PROTEIN: 6 g/dL (ref 6.0–8.5)

## 2016-05-25 ENCOUNTER — Telehealth: Payer: Self-pay | Admitting: Nurse Practitioner

## 2016-05-25 NOTE — Telephone Encounter (Addendum)
LVM on pt's personal machine. ----- Message from [name], MD sent at 05/21/2016 12:32 PM EDT ----- Call - NO obvious myeloma protein is seen!!!  pete

## 2016-05-29 ENCOUNTER — Other Ambulatory Visit: Payer: Self-pay | Admitting: Nurse Practitioner

## 2016-05-29 DIAGNOSIS — C9000 Multiple myeloma not having achieved remission: Secondary | ICD-10-CM

## 2016-05-29 MED ORDER — LENALIDOMIDE 15 MG PO CAPS: 15.0000 mg | ORAL_CAPSULE | Freq: Every day | ORAL | 0 refills | Status: DC

## 2016-07-14 ENCOUNTER — Ambulatory Visit (HOSPITAL_BASED_OUTPATIENT_CLINIC_OR_DEPARTMENT_OTHER): Payer: Medicare Other | Admitting: Hematology & Oncology

## 2016-07-14 ENCOUNTER — Ambulatory Visit (HOSPITAL_BASED_OUTPATIENT_CLINIC_OR_DEPARTMENT_OTHER): Payer: Medicare Other

## 2016-07-14 ENCOUNTER — Other Ambulatory Visit (HOSPITAL_BASED_OUTPATIENT_CLINIC_OR_DEPARTMENT_OTHER): Payer: Medicare Other

## 2016-07-14 VITALS — BP 132/89 | HR 76 | Temp 97.5°F | Resp 18 | Wt 150.1 lb

## 2016-07-14 DIAGNOSIS — D630 Anemia in neoplastic disease: Secondary | ICD-10-CM | POA: Diagnosis not present

## 2016-07-14 DIAGNOSIS — C773 Secondary and unspecified malignant neoplasm of axilla and upper limb lymph nodes: Secondary | ICD-10-CM | POA: Diagnosis not present

## 2016-07-14 DIAGNOSIS — I428 Other cardiomyopathies: Secondary | ICD-10-CM

## 2016-07-14 DIAGNOSIS — C50911 Malignant neoplasm of unspecified site of right female breast: Secondary | ICD-10-CM | POA: Diagnosis not present

## 2016-07-14 DIAGNOSIS — C9001 Multiple myeloma in remission: Secondary | ICD-10-CM

## 2016-07-14 LAB — CMP (CANCER CENTER ONLY)
ALK PHOS: 67 U/L (ref 26–84)
ALT: 21 U/L (ref 10–47)
AST: 22 U/L (ref 11–38)
Albumin: 3.8 g/dL (ref 3.3–5.5)
BILIRUBIN TOTAL: 1.2 mg/dL (ref 0.20–1.60)
BUN: 22 mg/dL (ref 7–22)
CO2: 22 mEq/L (ref 18–33)
CREATININE: 1.3 mg/dL — AB (ref 0.6–1.2)
Calcium: 9.3 mg/dL (ref 8.0–10.3)
Chloride: 103 mEq/L (ref 98–108)
GLUCOSE: 90 mg/dL (ref 73–118)
Potassium: 3.9 mEq/L (ref 3.3–4.7)
SODIUM: 133 meq/L (ref 128–145)
Total Protein: 6.4 g/dL (ref 6.4–8.1)

## 2016-07-14 LAB — CBC WITH DIFFERENTIAL (CANCER CENTER ONLY)
BASO#: 0.1 10*3/uL (ref 0.0–0.2)
BASO%: 0.9 % (ref 0.0–2.0)
EOS ABS: 0.2 10*3/uL (ref 0.0–0.5)
EOS%: 3.6 % (ref 0.0–7.0)
HCT: 37 % (ref 34.8–46.6)
HGB: 12.6 g/dL (ref 11.6–15.9)
LYMPH#: 0.9 10*3/uL (ref 0.9–3.3)
LYMPH%: 16.6 % (ref 14.0–48.0)
MCH: 35.4 pg — ABNORMAL HIGH (ref 26.0–34.0)
MCHC: 34.1 g/dL (ref 32.0–36.0)
MCV: 104 fL — ABNORMAL HIGH (ref 81–101)
MONO#: 0.8 10*3/uL (ref 0.1–0.9)
MONO%: 13.6 % — AB (ref 0.0–13.0)
NEUT%: 65.3 % (ref 39.6–80.0)
NEUTROS ABS: 3.6 10*3/uL (ref 1.5–6.5)
PLATELETS: 170 10*3/uL (ref 145–400)
RBC: 3.56 10*6/uL — ABNORMAL LOW (ref 3.70–5.32)
RDW: 14.6 % (ref 11.1–15.7)
WBC: 5.5 10*3/uL (ref 3.9–10.0)

## 2016-07-14 MED ORDER — SODIUM CHLORIDE 0.9% FLUSH
10.0000 mL | INTRAVENOUS | Status: DC | PRN
  Administered 2016-07-14: 10 mL via INTRAVENOUS
  Filled 2016-07-14: qty 10

## 2016-07-14 MED ORDER — SODIUM CHLORIDE 0.9 % IV SOLN
3.0000 mg | Freq: Once | INTRAVENOUS | Status: AC
  Administered 2016-07-14: 3 mg via INTRAVENOUS
  Filled 2016-07-14: qty 3.75

## 2016-07-14 MED ORDER — HEPARIN SOD (PORK) LOCK FLUSH 100 UNIT/ML IV SOLN
500.0000 [IU] | Freq: Once | INTRAVENOUS | Status: AC
  Administered 2016-07-14: 500 [IU] via INTRAVENOUS
  Filled 2016-07-14: qty 5

## 2016-07-14 NOTE — Patient Instructions (Signed)

## 2016-07-14 NOTE — Progress Notes (Signed)
Hematology and Oncology Follow Up Visit  Jacqueline Rivas [id number] April 26, 1932 82 y.o. 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 is back for followup. She looks real good. As always, she looks very tan. She still is not exercising all that much. She has moved down to [place].  She is eating okay. She has not had any nausea or vomiting.  She is tolerating the Revlimid incredibly well.  When we last saw her in early August, there is no monoclonal spike in her serum. but this appears to be resolved clinically.   She also is on with the locally advanced breast cancer. She has some numbness in the right axilla. This is chronic for her. She's not noted any swelling of the right arm. There's been no pain on the right chest wall.   As far as I know, her cardiac status has been pretty steady. I'm not sure she sees a cardiologist in longer.   Currently, her performance status is ECOG 1.  Medications:  Current Outpatient Prescriptions:  .  aspirin EC 81 MG tablet, Take 162 mg by mouth daily., Disp: , Rfl:  .  calcium carbonate (OS-CAL) 600 MG TABS tablet, Take 1,200 mg by mouth daily with breakfast., Disp: , Rfl:  .  carvedilol (COREG) 3.125 MG tablet, Take 1 tablet (3.125 mg total) by mouth 2 (two) times daily with a meal., Disp: 180 tablet, Rfl: 3 .  Coenzyme Q10 (CO Q 10) 100 MG CAPS, Take 100 mg by mouth every morning. , Disp: , Rfl:  .  dexamethasone (DECADRON) 4 MG tablet, Take 3 pills weekly., Disp: 120 tablet, Rfl: 3 .  lansoprazole (PREVACID) 30 MG capsule, Take 30 mg by mouth daily at 12  noon. , Disp: , Rfl:  .  lenalidomide (REVLIMID) 15 MG capsule, Take 1 capsule (15 mg total) by mouth daily. Take for 21 days. Off for 7 days. Auth# [id number], Disp: 21 capsule, Rfl: 0 .  lisinopril (PRINIVIL,ZESTRIL) 2.5 MG tablet, Take 1 tablet (2.5 mg total) by mouth daily., Disp: 30 tablet, Rfl: 3 .  magnesium 30 MG tablet, Take 30 mg by mouth every morning., Disp: , Rfl:  .  omeprazole (PRILOSEC) 20 MG capsule, TAKE 1 CAPSULE BY MOUTH EVERY MORNING, Disp: 30 capsule, Rfl: 0 .  polyethylene glycol (MIRALAX / GLYCOLAX) packet, Take 17 g by mouth as needed. , Disp: , Rfl:  .  potassium chloride SA (K-DUR,KLOR-CON) 20 MEQ tablet, Take 1 tablet (20 mEq total) by mouth daily., Disp: 90 tablet, Rfl: 3 .  spironolactone (ALDACTONE) 25 MG tablet, Take 1 tablet (25 mg total) by mouth daily., Disp: 30 tablet, Rfl: 6 .  torsemide (DEMADEX) 20 MG tablet, Take one tab (20mg ) every other day, Disp: 30 tablet, Rfl: 3 No current facility-administered medications for this visit.   Facility-Administered Medications Ordered in Other Visits:  .  sodium chloride 0.9 % injection 10 mL, 10 mL, Intracatheter, PRN, Jacqueline Napoleon, MD, 10 mL at 04/16/14 1332 .  sodium chloride flush (NS)  0.9 % injection 10 mL, 10 mL, Intravenous, PRN, Jacqueline Napoleon, MD, 10 mL at 07/14/16 1121  Allergies: No Known Allergies  Past Medical History, Surgical history, Social history, and Family History were reviewed and updated.  Review of Systems: As above  Physical Exam:  weight is 150 lb 1.9 oz (68.1 kg). Her oral temperature is 97.5 F (36.4 C). Her blood pressure is 132/89 and her pulse is 76. Her respiration is 18 and oxygen saturation is 99%.   Jacqueline, thin white Rivas in no obvious distress. Head and neck exam shows no ocular or oral lesions. There are no palpable cervical or supraclavicular lymph nodes. Lungs are clear. Cardiac exam regular rate and rhythm. She has an occasional extra beat. She has a 1/6 systolic ejection  murmur. Abdomen is soft. She has good bowel sounds. There is no fluid wave. There is no palpable liver or spleen tip. Extremities shows no clubbing, cyanosis or edema. She has some trace edema in her lower legs. She has some age-related osteoarthritic changes. Breast exam shows left breast with no masses, edema or erythema. There is no left axillary adenopathy. Right chest wall shows a well-healed mastectomy. No right chest wall nodules are noted. There is no right axillary adenopathy. Neurological exam shows no focal neurological deficits.  Lab Results  Component Value Date   WBC 5.5 07/14/2016   HGB 12.6 07/14/2016   HCT 37.0 07/14/2016   MCV 104 (H) 07/14/2016   PLT 170 07/14/2016     Chemistry      Component Value Date/Time   NA 133 07/14/2016 0932   K 3.9 07/14/2016 0932   CL 103 07/14/2016 0932   CO2 22 07/14/2016 0932   BUN 22 07/14/2016 0932   CREATININE 1.3 (H) 07/14/2016 0932      Component Value Date/Time   CALCIUM 9.3 07/14/2016 0932   CALCIUM 11.1 (H) 11/13/2012 1526   ALKPHOS 67 07/14/2016 0932   AST 22 07/14/2016 0932   ALT 21 07/14/2016 0932   BILITOT 1.20 07/14/2016 0932         Impression and Plan: Jacqueline Rivas is 82 year old white Rivas. She has both IgA kappa myeloma and stage II breast cancer. She continues to look quite good. She's responding. She has no obvious monoclonal spike with her last studies.   We are now going on over 3 years we first saw her.  Again, I do not see any issues with respect to her breast cancer. The myeloma is doing quite well. She is doing well on the Revlimid.  We will go ahead and plan to get her back in 2 more months. She will get her Zometa today. She does not need Aranesp.  I spent about 30 minutes with she and her son.     Jacqueline Napoleon, MD 9/26/201712:01 PM

## 2016-07-15 LAB — IGG, IGA, IGM
IGA/IMMUNOGLOBULIN A, SERUM: 230 mg/dL (ref 64–422)
IGG (IMMUNOGLOBIN G), SERUM: 603 mg/dL — AB (ref 700–1600)
IGM (IMMUNOGLOBIN M), SRM: 77 mg/dL (ref 26–217)

## 2016-07-15 LAB — KAPPA/LAMBDA LIGHT CHAINS
IG LAMBDA FREE LIGHT CHAIN: 22.2 mg/L (ref 5.7–26.3)
Ig Kappa Free Light Chain: 32.9 mg/L — ABNORMAL HIGH (ref 3.3–19.4)
KAPPA/LAMBDA FLC RATIO: 1.48 (ref 0.26–1.65)

## 2016-07-15 LAB — CANCER ANTIGEN 27.29: CA 27.29: 13.9 U/mL (ref 0.0–38.6)

## 2016-07-15 LAB — CANCER ANTIGEN 27-29 (PARALLEL TESTING): CA 27.29: 16 U/mL (ref <38)

## 2016-07-16 LAB — PROTEIN ELECTROPHORESIS, SERUM, WITH REFLEX
A/G Ratio: 1.4 (ref 0.7–1.7)
ALPHA 1: 0.2 g/dL (ref 0.0–0.4)
ALPHA 2: 0.7 g/dL (ref 0.4–1.0)
Albumin: 3.5 g/dL (ref 2.9–4.4)
Beta: 0.9 g/dL (ref 0.7–1.3)
Gamma Globulin: 0.7 g/dL (ref 0.4–1.8)
Globulin, Total: 2.5 g/dL (ref 2.2–3.9)
PDF SPE: 0
Total Protein: 6 g/dL (ref 6.0–8.5)

## 2016-07-17 ENCOUNTER — Telehealth: Payer: Self-pay | Admitting: *Deleted

## 2016-07-17 NOTE — Telephone Encounter (Addendum)
Patient aware of results  ----- Message from [name], MD sent at 07/16/2016  4:48 PM EDT ----- Call - NO myeloma protein is found!!!  Jacqueline Rivas

## 2016-07-23 ENCOUNTER — Other Ambulatory Visit: Payer: Self-pay | Admitting: *Deleted

## 2016-07-23 DIAGNOSIS — C9000 Multiple myeloma not having achieved remission: Secondary | ICD-10-CM

## 2016-07-23 MED ORDER — LENALIDOMIDE 15 MG PO CAPS: 15.0000 mg | ORAL_CAPSULE | Freq: Every day | ORAL | 0 refills | Status: DC

## 2016-08-11 DIAGNOSIS — H5213 Myopia, bilateral: Secondary | ICD-10-CM | POA: Diagnosis not present

## 2016-08-11 DIAGNOSIS — H25043 Posterior subcapsular polar age-related cataract, bilateral: Secondary | ICD-10-CM | POA: Diagnosis not present

## 2016-08-11 DIAGNOSIS — H2513 Age-related nuclear cataract, bilateral: Secondary | ICD-10-CM | POA: Diagnosis not present

## 2016-08-13 ENCOUNTER — Other Ambulatory Visit: Payer: Self-pay | Admitting: *Deleted

## 2016-08-13 ENCOUNTER — Other Ambulatory Visit: Payer: Self-pay | Admitting: Family

## 2016-08-13 DIAGNOSIS — N183 Chronic kidney disease, stage 3 (moderate): Principal | ICD-10-CM

## 2016-08-13 DIAGNOSIS — D631 Anemia in chronic kidney disease: Secondary | ICD-10-CM | POA: Insufficient documentation

## 2016-08-13 DIAGNOSIS — C9000 Multiple myeloma not having achieved remission: Secondary | ICD-10-CM

## 2016-08-13 MED ORDER — LENALIDOMIDE 15 MG PO CAPS: 15.0000 mg | ORAL_CAPSULE | Freq: Every day | ORAL | 0 refills | Status: DC

## 2016-08-25 DIAGNOSIS — H25813 Combined forms of age-related cataract, bilateral: Secondary | ICD-10-CM | POA: Diagnosis not present

## 2016-09-02 DIAGNOSIS — H21261 Iris atrophy (essential) (progressive), right eye: Secondary | ICD-10-CM | POA: Diagnosis not present

## 2016-09-02 DIAGNOSIS — Z79899 Other long term (current) drug therapy: Secondary | ICD-10-CM | POA: Diagnosis not present

## 2016-09-02 DIAGNOSIS — H25811 Combined forms of age-related cataract, right eye: Secondary | ICD-10-CM | POA: Diagnosis not present

## 2016-09-02 DIAGNOSIS — H2511 Age-related nuclear cataract, right eye: Secondary | ICD-10-CM | POA: Diagnosis not present

## 2016-09-02 DIAGNOSIS — I509 Heart failure, unspecified: Secondary | ICD-10-CM | POA: Diagnosis not present

## 2016-09-02 DIAGNOSIS — Z7982 Long term (current) use of aspirin: Secondary | ICD-10-CM | POA: Diagnosis not present

## 2016-09-02 DIAGNOSIS — I110 Hypertensive heart disease with heart failure: Secondary | ICD-10-CM | POA: Diagnosis not present

## 2016-09-08 ENCOUNTER — Ambulatory Visit: Payer: Medicare Other | Admitting: Hematology & Oncology

## 2016-09-08 ENCOUNTER — Ambulatory Visit: Payer: Medicare Other

## 2016-09-08 ENCOUNTER — Other Ambulatory Visit: Payer: Medicare Other

## 2016-09-08 DIAGNOSIS — H25812 Combined forms of age-related cataract, left eye: Secondary | ICD-10-CM | POA: Diagnosis not present

## 2016-09-15 ENCOUNTER — Ambulatory Visit (HOSPITAL_BASED_OUTPATIENT_CLINIC_OR_DEPARTMENT_OTHER): Payer: Medicare Other

## 2016-09-15 ENCOUNTER — Other Ambulatory Visit (HOSPITAL_BASED_OUTPATIENT_CLINIC_OR_DEPARTMENT_OTHER): Payer: Medicare Other

## 2016-09-15 ENCOUNTER — Ambulatory Visit (HOSPITAL_BASED_OUTPATIENT_CLINIC_OR_DEPARTMENT_OTHER): Payer: Medicare Other | Admitting: Hematology & Oncology

## 2016-09-15 VITALS — BP 151/73 | HR 67 | Temp 97.1°F | Resp 20 | Wt 154.0 lb

## 2016-09-15 DIAGNOSIS — Z452 Encounter for adjustment and management of vascular access device: Secondary | ICD-10-CM | POA: Diagnosis not present

## 2016-09-15 DIAGNOSIS — C9001 Multiple myeloma in remission: Secondary | ICD-10-CM

## 2016-09-15 DIAGNOSIS — C773 Secondary and unspecified malignant neoplasm of axilla and upper limb lymph nodes: Secondary | ICD-10-CM

## 2016-09-15 DIAGNOSIS — I428 Other cardiomyopathies: Secondary | ICD-10-CM | POA: Diagnosis not present

## 2016-09-15 DIAGNOSIS — S22000A Wedge compression fracture of unspecified thoracic vertebra, initial encounter for closed fracture: Secondary | ICD-10-CM

## 2016-09-15 DIAGNOSIS — C9000 Multiple myeloma not having achieved remission: Secondary | ICD-10-CM

## 2016-09-15 DIAGNOSIS — D630 Anemia in neoplastic disease: Secondary | ICD-10-CM

## 2016-09-15 DIAGNOSIS — C50911 Malignant neoplasm of unspecified site of right female breast: Secondary | ICD-10-CM | POA: Diagnosis not present

## 2016-09-15 DIAGNOSIS — E876 Hypokalemia: Secondary | ICD-10-CM

## 2016-09-15 LAB — CBC WITH DIFFERENTIAL (CANCER CENTER ONLY)
BASO#: 0.1 10*3/uL (ref 0.0–0.2)
BASO%: 1.2 % (ref 0.0–2.0)
EOS%: 1.9 % (ref 0.0–7.0)
Eosinophils Absolute: 0.1 10*3/uL (ref 0.0–0.5)
HEMATOCRIT: 38.2 % (ref 34.8–46.6)
HEMOGLOBIN: 12.7 g/dL (ref 11.6–15.9)
LYMPH#: 0.8 10*3/uL — AB (ref 0.9–3.3)
LYMPH%: 13.8 % — ABNORMAL LOW (ref 14.0–48.0)
MCH: 35.4 pg — ABNORMAL HIGH (ref 26.0–34.0)
MCHC: 33.2 g/dL (ref 32.0–36.0)
MCV: 106 fL — AB (ref 81–101)
MONO#: 0.7 10*3/uL (ref 0.1–0.9)
MONO%: 11.2 % (ref 0.0–13.0)
NEUT%: 71.9 % (ref 39.6–80.0)
NEUTROS ABS: 4.2 10*3/uL (ref 1.5–6.5)
Platelets: 209 10*3/uL (ref 145–400)
RBC: 3.59 10*6/uL — ABNORMAL LOW (ref 3.70–5.32)
RDW: 14.4 % (ref 11.1–15.7)
WBC: 5.9 10*3/uL (ref 3.9–10.0)

## 2016-09-15 LAB — CMP (CANCER CENTER ONLY)
ALBUMIN: 3.4 g/dL (ref 3.3–5.5)
ALK PHOS: 68 U/L (ref 26–84)
ALT: 19 U/L (ref 10–47)
AST: 25 U/L (ref 11–38)
BILIRUBIN TOTAL: 0.5 mg/dL (ref 0.20–1.60)
BUN, Bld: 19 mg/dL (ref 7–22)
CALCIUM: 9.5 mg/dL (ref 8.0–10.3)
CO2: 24 mEq/L (ref 18–33)
Chloride: 107 mEq/L (ref 98–108)
Creat: 1.2 mg/dl (ref 0.6–1.2)
Glucose, Bld: 94 mg/dL (ref 73–118)
POTASSIUM: 4.3 meq/L (ref 3.3–4.7)
Sodium: 136 mEq/L (ref 128–145)
TOTAL PROTEIN: 6.5 g/dL (ref 6.4–8.1)

## 2016-09-15 MED ORDER — ZOLEDRONIC ACID 4 MG/5ML IV CONC
3.0000 mg | Freq: Once | INTRAVENOUS | Status: AC
  Administered 2016-09-15: 3 mg via INTRAVENOUS
  Filled 2016-09-15: qty 3.75

## 2016-09-15 MED ORDER — MUPIROCIN 2 % EX OINT: 1.0000 "application " | TOPICAL_OINTMENT | Freq: Two times a day (BID) | CUTANEOUS | 0 refills | Status: DC

## 2016-09-15 MED ORDER — ALTEPLASE 2 MG IJ SOLR
INTRAMUSCULAR | Status: AC
Start: 2016-09-15 — End: 2016-09-15
  Filled 2016-09-15: qty 2

## 2016-09-15 MED ORDER — POTASSIUM CHLORIDE CRYS ER 20 MEQ PO TBCR: 20.0000 meq | EXTENDED_RELEASE_TABLET | Freq: Every day | ORAL | 3 refills | Status: DC

## 2016-09-15 MED ORDER — ALTEPLASE 2 MG IJ SOLR
2.0000 mg | Freq: Once | INTRAMUSCULAR | Status: AC | PRN
  Administered 2016-09-15: 2 mg
  Filled 2016-09-15: qty 2

## 2016-09-15 MED ORDER — SODIUM CHLORIDE 0.9% FLUSH
10.0000 mL | INTRAVENOUS | Status: DC | PRN
  Administered 2016-09-15: 10 mL via INTRAVENOUS
  Filled 2016-09-15: qty 10

## 2016-09-15 MED ORDER — STERILE WATER FOR INJECTION IJ SOLN
INTRAMUSCULAR | Status: AC
Start: 2016-09-15 — End: 2016-09-15
  Filled 2016-09-15: qty 10

## 2016-09-15 MED ORDER — HEPARIN SOD (PORK) LOCK FLUSH 100 UNIT/ML IV SOLN
500.0000 [IU] | Freq: Once | INTRAVENOUS | Status: AC
  Administered 2016-09-15: 500 [IU] via INTRAVENOUS
  Filled 2016-09-15: qty 5

## 2016-09-15 MED FILL — POTASSIUM CL ER 20 MEQ TABL: 20 | 90 days supply | Qty: 90 | Fill #0

## 2016-09-15 MED FILL — MUPIROCIN 2% OINTMENT: 2 | 10 days supply | Qty: 22 | Fill #0

## 2016-09-15 NOTE — Progress Notes (Signed)
Hematology and Oncology Follow Up Visit  Cyndal Donatelli [id number] 03-10-1932 82 y.o. 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  Jacqueline Rivas is back for followup. She sees rehabilitation and some issues. Again I don't think these are issues that are related to her myeloma or her breast cancer. She had cataract surgery recently. She is a little bit dizzy she says. She is not drinking enough water. I'm not sure how much she does with taking her medications.  Of course she does not have a regular doctor down in [place]. It sounds like where she is living is getting a new doctor.  Her eczema was pretty good. All the family came in and she enjoyed this.  Her last myeloma studies done back in late September did not show a monoclonal spike in her serum. Her  IgA level was down to 230 mg/dL. Her Kappa Light chain was 3.3 mg/dL.   She seems be eating okay. Again she's not drinking a lot. She's not exercising. She is always tells them that she will but her son, who comes with her, says she never does.   She is not taking her Decadron with the Revlimid.   Currently, her performance status is ECOG 1-.2 Medications:  Current Outpatient Prescriptions:  .  aspirin EC 81 MG tablet, Take 162 mg by mouth daily., Disp: , Rfl:  .  calcium carbonate (OS-CAL) 600 MG TABS tablet, Take 1,200 mg by mouth daily with breakfast., Disp: , Rfl:  .  Coenzyme Q10 (CO Q 10) 100 MG CAPS, Take 100 mg by mouth every morning. , Disp: , Rfl:  .  dexamethasone (DECADRON) 4 MG tablet, Take 3 pills weekly., Disp: 120 tablet, Rfl: 3 .   lansoprazole (PREVACID) 30 MG capsule, Take 30 mg by mouth daily at 12 noon. , Disp: , Rfl:  .  lenalidomide (REVLIMID) 15 MG capsule, Take 1 capsule (15 mg total) by mouth daily. Take for 21 days. Off for 7 days. [id number], Disp: 21 capsule, Rfl: 0 .  lisinopril (PRINIVIL,ZESTRIL) 2.5 MG tablet, Take 1 tablet (2.5 mg total) by mouth daily., Disp: 30 tablet, Rfl: 3 .  magnesium 30 MG tablet, Take 30 mg by mouth every morning., Disp: , Rfl:  .  mupirocin ointment (BACTROBAN) 2 %, Apply 1 application topically 2 (two) times daily., Disp: 22 g, Rfl: 0 .  omeprazole (PRILOSEC) 20 MG capsule, TAKE 1 CAPSULE BY MOUTH EVERY MORNING, Disp: 30 capsule, Rfl: 0 .  polyethylene glycol (MIRALAX / GLYCOLAX) packet, Take 17 g by mouth as needed. , Disp: , Rfl:  .  potassium chloride SA (K-DUR,KLOR-CON) 20 MEQ tablet, Take 1 tablet (20 mEq total) by mouth daily., Disp: 90 tablet, Rfl: 3 .  spironolactone (ALDACTONE) 25 MG tablet, Take 1 tablet (25 mg total) by mouth daily., Disp: 30 tablet, Rfl: 6 .  torsemide (DEMADEX) 20 MG tablet, Take one tab (20mg ) every other day, Disp: 30 tablet, Rfl: 3 No current facility-administered medications for this visit.   Facility-Administered Medications Ordered in Other  Visits:  .  sodium chloride 0.9 % injection 10 mL, 10 mL, Intracatheter, PRN, [name], MD, 10 mL at 04/16/14 1332 .  sodium chloride flush (NS) 0.9 % injection 10 mL, 10 mL, Intravenous, PRN, [name], MD, 10 mL at 09/15/16 1255  Allergies: No Known Allergies  Past Medical History, Surgical history, Social history, and Family History were reviewed and updated.  Review of Systems: As above  Physical Exam:  weight is 154 lb (69.9 kg). Her oral temperature is 97.1 F (36.2 C). Her blood pressure is 151/73 (abnormal) and her pulse is 67. Her respiration is 20.   Elderly, thin white female in no obvious distress. Head and neck exam shows no ocular or oral lesions. There are no palpable  cervical or supraclavicular lymph nodes. Lungs are clear. Cardiac exam regular rate and rhythm. She has an occasional extra beat. She has a 1/6 systolic ejection murmur. Abdomen is soft. She has good bowel sounds. There is no fluid wave. There is no palpable liver or spleen tip. Extremities shows no clubbing, cyanosis or edema. She has some trace edema in her lower legs. She has some age-related osteoarthritic changes. Breast exam shows left breast with no masses, edema or erythema. There is no left axillary adenopathy. Right chest wall shows a well-healed mastectomy. No right chest wall nodules are noted. There is no right axillary adenopathy. Neurological exam shows no focal neurological deficits.  Lab Results  Component Value Date   WBC 5.9 09/15/2016   HGB 12.7 09/15/2016   HCT 38.2 09/15/2016   MCV 106 (H) 09/15/2016   PLT 209 09/15/2016     Chemistry      Component Value Date/Time   NA 136 09/15/2016 1150   K 4.3 09/15/2016 1150   CL 107 09/15/2016 1150   CO2 24 09/15/2016 1150   BUN 19 09/15/2016 1150   CREATININE 1.2 09/15/2016 1150      Component Value Date/Time   CALCIUM 9.5 09/15/2016 1150   CALCIUM 11.1 (H) 11/13/2012 1526   ALKPHOS 68 09/15/2016 1150   AST 25 09/15/2016 1150   ALT 19 09/15/2016 1150   BILITOT 0.50 09/15/2016 1150         Impression and Plan: [name] is 82 year old white female. She has both IgA kappa myeloma and stage II breast cancer. She continues to look quite good. She's responding. She has no obvious monoclonal spike with her last studies.   We are now going on over 3 years we first saw her.  Again, I do not see any issues with respect to her breast cancer. The myeloma is doing quite well. She is doing well on the Revlimid. I do so but she is taking it.  She will get her Zometa today. She thinks Zometa might be caused her problems. I try to reassure her that Zometa does not cause dizziness or eye problems or cataracts.  I would like to  see her back in 6 weeks. I thing we might be oh to go to every 3 months or so with Zometa.  I spent about 30 minutes with she and her son.     [name], MD 11/28/20171:49 PM

## 2016-09-15 NOTE — Patient Instructions (Signed)

## 2016-09-16 LAB — KAPPA/LAMBDA LIGHT CHAINS
IG KAPPA FREE LIGHT CHAIN: 27.1 mg/L — AB (ref 3.3–19.4)
Ig Lambda Free Light Chain: 17.8 mg/L (ref 5.7–26.3)
KAPPA/LAMBDA FLC RATIO: 1.52 (ref 0.26–1.65)

## 2016-09-16 LAB — CANCER ANTIGEN 27.29: CAN 27.29: 19.7 U/mL (ref 0.0–38.6)

## 2016-09-16 LAB — IGG, IGA, IGM
IGA/IMMUNOGLOBULIN A, SERUM: 230 mg/dL (ref 64–422)
IGM (IMMUNOGLOBIN M), SRM: 73 mg/dL (ref 26–217)
IgG, Qn, Serum: 615 mg/dL — ABNORMAL LOW (ref 700–1600)

## 2016-09-17 ENCOUNTER — Telehealth: Payer: Self-pay | Admitting: *Deleted

## 2016-09-17 LAB — PROTEIN ELECTROPHORESIS, SERUM, WITH REFLEX
A/G Ratio: 1.4 (ref 0.7–1.7)
ALPHA 1: 0.2 g/dL (ref 0.0–0.4)
ALPHA 2: 0.8 g/dL (ref 0.4–1.0)
Albumin: 3.7 g/dL (ref 2.9–4.4)
Beta: 1.2 g/dL (ref 0.7–1.3)
GLOBULIN, TOTAL: 2.7 g/dL (ref 2.2–3.9)
Gamma Globulin: 0.5 g/dL (ref 0.4–1.8)
TOTAL PROTEIN: 6.4 g/dL (ref 6.0–8.5)

## 2016-09-17 NOTE — Telephone Encounter (Addendum)
Patient aware of results  ----- Message from [name], MD sent at 09/17/2016 11:42 AM EST ----- Call - NO myeloma found in the blood!!  pete

## 2016-09-24 ENCOUNTER — Other Ambulatory Visit: Payer: Self-pay | Admitting: *Deleted

## 2016-09-24 DIAGNOSIS — C9000 Multiple myeloma not having achieved remission: Secondary | ICD-10-CM

## 2016-09-24 MED ORDER — LENALIDOMIDE 15 MG PO CAPS: 15.0000 mg | ORAL_CAPSULE | Freq: Every day | ORAL | 0 refills | Status: DC

## 2016-10-21 DIAGNOSIS — I509 Heart failure, unspecified: Secondary | ICD-10-CM | POA: Diagnosis not present

## 2016-10-21 DIAGNOSIS — H25812 Combined forms of age-related cataract, left eye: Secondary | ICD-10-CM | POA: Diagnosis not present

## 2016-10-21 DIAGNOSIS — I110 Hypertensive heart disease with heart failure: Secondary | ICD-10-CM | POA: Diagnosis not present

## 2016-10-21 DIAGNOSIS — Z853 Personal history of malignant neoplasm of breast: Secondary | ICD-10-CM | POA: Diagnosis not present

## 2016-10-21 DIAGNOSIS — Z8579 Personal history of other malignant neoplasms of lymphoid, hematopoietic and related tissues: Secondary | ICD-10-CM | POA: Diagnosis not present

## 2016-11-03 ENCOUNTER — Ambulatory Visit (HOSPITAL_BASED_OUTPATIENT_CLINIC_OR_DEPARTMENT_OTHER): Payer: Medicare Other | Admitting: Hematology & Oncology

## 2016-11-03 ENCOUNTER — Ambulatory Visit (HOSPITAL_BASED_OUTPATIENT_CLINIC_OR_DEPARTMENT_OTHER): Payer: Medicare Other

## 2016-11-03 ENCOUNTER — Other Ambulatory Visit (HOSPITAL_BASED_OUTPATIENT_CLINIC_OR_DEPARTMENT_OTHER): Payer: Medicare Other

## 2016-11-03 VITALS — BP 151/81 | HR 84 | Temp 97.6°F | Resp 20 | Wt 152.1 lb

## 2016-11-03 DIAGNOSIS — C9000 Multiple myeloma not having achieved remission: Secondary | ICD-10-CM

## 2016-11-03 DIAGNOSIS — E876 Hypokalemia: Secondary | ICD-10-CM

## 2016-11-03 DIAGNOSIS — D630 Anemia in neoplastic disease: Secondary | ICD-10-CM | POA: Diagnosis not present

## 2016-11-03 DIAGNOSIS — C9001 Multiple myeloma in remission: Secondary | ICD-10-CM

## 2016-11-03 DIAGNOSIS — S22000A Wedge compression fracture of unspecified thoracic vertebra, initial encounter for closed fracture: Secondary | ICD-10-CM

## 2016-11-03 DIAGNOSIS — C50911 Malignant neoplasm of unspecified site of right female breast: Secondary | ICD-10-CM | POA: Diagnosis not present

## 2016-11-03 DIAGNOSIS — C773 Secondary and unspecified malignant neoplasm of axilla and upper limb lymph nodes: Secondary | ICD-10-CM

## 2016-11-03 DIAGNOSIS — I429 Cardiomyopathy, unspecified: Secondary | ICD-10-CM

## 2016-11-03 LAB — CMP (CANCER CENTER ONLY)
ALBUMIN: 3.6 g/dL (ref 3.3–5.5)
ALT(SGPT): 25 U/L (ref 10–47)
AST: 21 U/L (ref 11–38)
Alkaline Phosphatase: 68 U/L (ref 26–84)
BUN, Bld: 25 mg/dL — ABNORMAL HIGH (ref 7–22)
CHLORIDE: 102 meq/L (ref 98–108)
CO2: 26 mEq/L (ref 18–33)
CREATININE: 1.5 mg/dL — AB (ref 0.6–1.2)
Calcium: 8.6 mg/dL (ref 8.0–10.3)
Glucose, Bld: 90 mg/dL (ref 73–118)
Potassium: 4.3 mEq/L (ref 3.3–4.7)
SODIUM: 138 meq/L (ref 128–145)
Total Bilirubin: 0.9 mg/dl (ref 0.20–1.60)
Total Protein: 6.7 g/dL (ref 6.4–8.1)

## 2016-11-03 LAB — CBC WITH DIFFERENTIAL (CANCER CENTER ONLY)
BASO#: 0.1 10*3/uL (ref 0.0–0.2)
BASO%: 0.7 % (ref 0.0–2.0)
EOS ABS: 0.1 10*3/uL (ref 0.0–0.5)
EOS%: 1.4 % (ref 0.0–7.0)
HCT: 38.7 % (ref 34.8–46.6)
HEMOGLOBIN: 12.9 g/dL (ref 11.6–15.9)
LYMPH#: 0.7 10*3/uL — ABNORMAL LOW (ref 0.9–3.3)
LYMPH%: 9.6 % — AB (ref 14.0–48.0)
MCH: 35.1 pg — AB (ref 26.0–34.0)
MCHC: 33.3 g/dL (ref 32.0–36.0)
MCV: 105 fL — AB (ref 81–101)
MONO#: 0.7 10*3/uL (ref 0.1–0.9)
MONO%: 8.5 % (ref 0.0–13.0)
NEUT#: 6.1 10*3/uL (ref 1.5–6.5)
NEUT%: 79.8 % (ref 39.6–80.0)
PLATELETS: 188 10*3/uL (ref 145–400)
RBC: 3.68 10*6/uL — ABNORMAL LOW (ref 3.70–5.32)
RDW: 13.8 % (ref 11.1–15.7)
WBC: 7.6 10*3/uL (ref 3.9–10.0)

## 2016-11-03 LAB — LACTATE DEHYDROGENASE: LDH: 122 U/L — ABNORMAL LOW (ref 125–245)

## 2016-11-03 MED ORDER — DEXAMETHASONE SODIUM PHOSPHATE 20 MG/5ML IJ SOLN: 40.0000 mg | Freq: Once | INTRAMUSCULAR | Status: DC

## 2016-11-03 MED ORDER — SODIUM CHLORIDE 0.9 % IV SOLN
3.0000 mg | Freq: Once | INTRAVENOUS | Status: AC
  Administered 2016-11-03: 3 mg via INTRAVENOUS
  Filled 2016-11-03: qty 3.75

## 2016-11-03 NOTE — Progress Notes (Signed)
Hematology and Oncology Follow Up Visit  Montzerrat Skwarek [id number] [birth year] 83 y.o. 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  Izatt is back for followup. She sees rehabilitation and some issues. Again I don't think these are issues that are related to her myeloma or her breast cancer. She had cataract surgery recently. She is a little bit dizzy she says. She is not drinking enough water. I'm not sure how much she does with taking her medications.  Of course she does not have a regular doctor down in [place]. It sounds like where she is living is getting a new doctor.  Her eczema was pretty good. All the family came in and she enjoyed this.  Her last myeloma studies done back in late September did not show a monoclonal spike in her serum. Her  IgA level was down to 230 mg/dL. Her Kappa Light chain was 2.7mg /dL.   Her CA 27.29 in November was stable at 20.  She seems be eating okay. Again she's not drinking a lot. She's not exercising. She is always tells them that she will but her son, who comes with her, says she never does.   She is not taking her Decadron with the Revlimid.   Currently, her performance status is ECOG 1-.2 Medications:  Current Outpatient Prescriptions:  .  aspirin EC 81 MG tablet, Take 162 mg by mouth daily., Disp: , Rfl:  .  calcium carbonate (OS-CAL) 600 MG TABS tablet, Take 1,200 mg by mouth daily with breakfast., Disp: , Rfl:  .  Coenzyme Q10 (CO Q 10) 100 MG CAPS, Take 100 mg by mouth every morning. , Disp: , Rfl:  .  dexamethasone (DECADRON) 4 MG tablet, Take 3  pills weekly., Disp: 120 tablet, Rfl: 3 .  lansoprazole (PREVACID) 30 MG capsule, Take 30 mg by mouth daily at 12 noon. , Disp: , Rfl:  .  lenalidomide (REVLIMID) 15 MG capsule, Take 1 capsule (15 mg total) by mouth daily. Take for 21 days. Off for 7 days. [id number], Disp: 21 capsule, Rfl: 0 .  lisinopril (PRINIVIL,ZESTRIL) 2.5 MG tablet, Take 1 tablet (2.5 mg total) by mouth daily., Disp: 30 tablet, Rfl: 3 .  magnesium 30 MG tablet, Take 30 mg by mouth every morning., Disp: , Rfl:  .  mupirocin ointment (BACTROBAN) 2 %, Apply 1 application topically 2 (two) times daily., Disp: 22 g, Rfl: 0 .  omeprazole (PRILOSEC) 20 MG capsule, TAKE 1 CAPSULE BY MOUTH EVERY MORNING, Disp: 30 capsule, Rfl: 0 .  polyethylene glycol (MIRALAX / GLYCOLAX) packet, Take 17 g by mouth as needed. , Disp: , Rfl:  .  potassium chloride SA (K-DUR,KLOR-CON) 20 MEQ tablet, Take 1 tablet (20 mEq total) by mouth daily., Disp: 90 tablet, Rfl: 3 .  spironolactone (ALDACTONE) 25 MG tablet, Take 1 tablet (25 mg total) by mouth daily., Disp: 30 tablet, Rfl: 6 .  torsemide (DEMADEX) 20 MG tablet, Take one tab (20mg ) every other day, Disp: 30 tablet, Rfl: 3 No current facility-administered medications for  this visit.   Facility-Administered Medications Ordered in Other Visits:  .  sodium chloride 0.9 % injection 10 mL, 10 mL, Intracatheter, PRN, [name], MD, 10 mL at 04/16/14 1332  Allergies: No Known Allergies  Past Medical History, Surgical history, Social history, and Family History were reviewed and updated.  Review of Systems: As above  Physical Exam:  vitals were not taken for this visit.  Elderly, thin white female in no obvious distress. Head and neck exam shows no ocular or oral lesions. There are no palpable cervical or supraclavicular lymph nodes. Lungs are clear. Cardiac exam regular rate and rhythm. She has an occasional extra beat. She has a 1/6 systolic ejection murmur. Abdomen is soft. She has good  bowel sounds. There is no fluid wave. There is no palpable liver or spleen tip. Extremities shows no clubbing, cyanosis or edema. She has some trace edema in her lower legs. She has some age-related osteoarthritic changes. Breast exam shows left breast with no masses, edema or erythema. There is no left axillary adenopathy. Right chest wall shows a well-healed mastectomy. No right chest wall nodules are noted. There is no right axillary adenopathy. Neurological exam shows no focal neurological deficits.  Lab Results  Component Value Date   WBC 5.9 09/15/2016   HGB 12.7 09/15/2016   HCT 38.2 09/15/2016   MCV 106 (H) 09/15/2016   PLT 209 09/15/2016     Chemistry      Component Value Date/Time   NA 136 09/15/2016 1150   K 4.3 09/15/2016 1150   CL 107 09/15/2016 1150   CO2 24 09/15/2016 1150   BUN 19 09/15/2016 1150   CREATININE 1.2 09/15/2016 1150      Component Value Date/Time   CALCIUM 9.5 09/15/2016 1150   CALCIUM 11.1 (H) 11/13/2012 1526   ALKPHOS 68 09/15/2016 1150   AST 25 09/15/2016 1150   ALT 19 09/15/2016 1150   BILITOT 0.50 09/15/2016 1150         Impression and Plan: Ms. Breece is 83 year old white female. She has both IgA kappa myeloma and stage II breast cancer. She continues to look quite good. She's responding. She has no obvious monoclonal spike with her last studies.   We are now going on over 3 years we first saw her.  Again, I do not see any issues with respect to her breast cancer. The myeloma is doing quite well. She is doing well on the Revlimid. I do so but she is taking it.  She will get her Zometa today. She thinks Zometa might be caused her problems. I try to reassure her that Zometa does not cause dizziness or eye problems or cataracts.  I would like to see her back in 8 weeks. I Think that we might be able to go to every 3 months or so with Zometa.  I spent about 30 minutes with she and her son.     [name], MD 1/16/201810:50 AM

## 2016-11-03 NOTE — Patient Instructions (Signed)

## 2016-11-04 LAB — KAPPA/LAMBDA LIGHT CHAINS
IG LAMBDA FREE LIGHT CHAIN: 20 mg/L (ref 5.7–26.3)
Ig Kappa Free Light Chain: 32.9 mg/L — ABNORMAL HIGH (ref 3.3–19.4)
KAPPA/LAMBDA FLC RATIO: 1.65 (ref 0.26–1.65)

## 2016-11-04 LAB — IGG, IGA, IGM
IGG (IMMUNOGLOBIN G), SERUM: 645 mg/dL — AB (ref 700–1600)
IgA, Qn, Serum: 233 mg/dL (ref 64–422)
IgM, Qn, Serum: 95 mg/dL (ref 26–217)

## 2016-11-05 LAB — PROTEIN ELECTROPHORESIS, SERUM, WITH REFLEX
A/G Ratio: 1.2 (ref 0.7–1.7)
ALPHA 1: 0.3 g/dL (ref 0.0–0.4)
ALPHA 2: 0.8 g/dL (ref 0.4–1.0)
Albumin: 3.4 g/dL (ref 2.9–4.4)
BETA: 0.9 g/dL (ref 0.7–1.3)
Gamma Globulin: 0.8 g/dL (ref 0.4–1.8)
Globulin, Total: 2.8 g/dL (ref 2.2–3.9)
Total Protein: 6.2 g/dL (ref 6.0–8.5)

## 2016-11-06 ENCOUNTER — Telehealth: Payer: Self-pay | Admitting: *Deleted

## 2016-11-06 NOTE — Telephone Encounter (Addendum)
Patient's son aware of results  ----- Message from [name], MD sent at 11/05/2016 10:51 AM EST ----- Call her son - NO myeloma noted in the blood!!  pete

## 2016-11-23 ENCOUNTER — Other Ambulatory Visit: Payer: Self-pay | Admitting: *Deleted

## 2016-11-23 DIAGNOSIS — C9000 Multiple myeloma not having achieved remission: Secondary | ICD-10-CM

## 2016-11-23 MED ORDER — LENALIDOMIDE 15 MG PO CAPS: 15.0000 mg | ORAL_CAPSULE | Freq: Every day | ORAL | 0 refills | Status: DC

## 2016-11-26 ENCOUNTER — Other Ambulatory Visit: Payer: Self-pay | Admitting: Hematology & Oncology

## 2016-11-26 DIAGNOSIS — C9000 Multiple myeloma not having achieved remission: Secondary | ICD-10-CM

## 2016-12-25 ENCOUNTER — Other Ambulatory Visit: Payer: Self-pay | Admitting: *Deleted

## 2016-12-25 DIAGNOSIS — C9000 Multiple myeloma not having achieved remission: Secondary | ICD-10-CM

## 2016-12-25 MED ORDER — LENALIDOMIDE 15 MG PO CAPS: 15.0000 mg | ORAL_CAPSULE | Freq: Every day | ORAL | 0 refills | Status: DC

## 2016-12-29 ENCOUNTER — Ambulatory Visit: Payer: Medicare Other | Admitting: Hematology & Oncology

## 2016-12-29 ENCOUNTER — Other Ambulatory Visit: Payer: Medicare Other

## 2016-12-29 ENCOUNTER — Ambulatory Visit: Payer: Medicare Other

## 2017-01-22 ENCOUNTER — Other Ambulatory Visit: Payer: Self-pay | Admitting: Hematology & Oncology

## 2017-01-22 DIAGNOSIS — C9000 Multiple myeloma not having achieved remission: Secondary | ICD-10-CM

## 2017-01-25 ENCOUNTER — Other Ambulatory Visit: Payer: Self-pay | Admitting: *Deleted

## 2017-01-25 DIAGNOSIS — C9000 Multiple myeloma not having achieved remission: Secondary | ICD-10-CM

## 2017-01-25 MED ORDER — LENALIDOMIDE 15 MG PO CAPS: 15.0000 mg | ORAL_CAPSULE | Freq: Every day | ORAL | 0 refills | Status: DC

## 2017-02-18 ENCOUNTER — Other Ambulatory Visit: Payer: Self-pay | Admitting: *Deleted

## 2017-02-18 DIAGNOSIS — C9000 Multiple myeloma not having achieved remission: Secondary | ICD-10-CM

## 2017-02-18 MED ORDER — LENALIDOMIDE 15 MG PO CAPS: 15.0000 mg | ORAL_CAPSULE | Freq: Every day | ORAL | 0 refills | Status: DC

## 2017-02-23 ENCOUNTER — Ambulatory Visit (HOSPITAL_BASED_OUTPATIENT_CLINIC_OR_DEPARTMENT_OTHER): Payer: Medicare Other | Admitting: Hematology & Oncology

## 2017-02-23 ENCOUNTER — Ambulatory Visit (HOSPITAL_BASED_OUTPATIENT_CLINIC_OR_DEPARTMENT_OTHER): Payer: Medicare Other

## 2017-02-23 ENCOUNTER — Ambulatory Visit: Payer: Medicare Other

## 2017-02-23 ENCOUNTER — Other Ambulatory Visit (HOSPITAL_BASED_OUTPATIENT_CLINIC_OR_DEPARTMENT_OTHER): Payer: Medicare Other

## 2017-02-23 VITALS — BP 156/68 | HR 72 | Temp 98.6°F | Resp 16 | Wt 152.5 lb

## 2017-02-23 DIAGNOSIS — C9000 Multiple myeloma not having achieved remission: Secondary | ICD-10-CM

## 2017-02-23 DIAGNOSIS — C773 Secondary and unspecified malignant neoplasm of axilla and upper limb lymph nodes: Secondary | ICD-10-CM

## 2017-02-23 DIAGNOSIS — Z853 Personal history of malignant neoplasm of breast: Secondary | ICD-10-CM | POA: Diagnosis not present

## 2017-02-23 DIAGNOSIS — Z95828 Presence of other vascular implants and grafts: Secondary | ICD-10-CM

## 2017-02-23 DIAGNOSIS — C50911 Malignant neoplasm of unspecified site of right female breast: Secondary | ICD-10-CM

## 2017-02-23 DIAGNOSIS — C9001 Multiple myeloma in remission: Secondary | ICD-10-CM

## 2017-02-23 DIAGNOSIS — S22000A Wedge compression fracture of unspecified thoracic vertebra, initial encounter for closed fracture: Secondary | ICD-10-CM

## 2017-02-23 LAB — CMP (CANCER CENTER ONLY)
ALT(SGPT): 26 U/L (ref 10–47)
AST: 22 U/L (ref 11–38)
Albumin: 3.8 g/dL (ref 3.3–5.5)
Alkaline Phosphatase: 67 U/L (ref 26–84)
BILIRUBIN TOTAL: 1 mg/dL (ref 0.20–1.60)
BUN, Bld: 15 mg/dL (ref 7–22)
CO2: 25 meq/L (ref 18–33)
CREATININE: 1.1 mg/dL (ref 0.6–1.2)
Calcium: 9.5 mg/dL (ref 8.0–10.3)
Chloride: 105 mEq/L (ref 98–108)
GLUCOSE: 94 mg/dL (ref 73–118)
Potassium: 4.2 mEq/L (ref 3.3–4.7)
SODIUM: 139 meq/L (ref 128–145)
Total Protein: 6.6 g/dL (ref 6.4–8.1)

## 2017-02-23 LAB — CBC WITH DIFFERENTIAL (CANCER CENTER ONLY)
BASO#: 0.1 10*3/uL (ref 0.0–0.2)
BASO%: 0.7 % (ref 0.0–2.0)
EOS%: 1 % (ref 0.0–7.0)
Eosinophils Absolute: 0.1 10*3/uL (ref 0.0–0.5)
HCT: 37.8 % (ref 34.8–46.6)
HEMOGLOBIN: 13.2 g/dL (ref 11.6–15.9)
LYMPH#: 1.1 10*3/uL (ref 0.9–3.3)
LYMPH%: 14.7 % (ref 14.0–48.0)
MCH: 35.9 pg — ABNORMAL HIGH (ref 26.0–34.0)
MCHC: 34.9 g/dL (ref 32.0–36.0)
MCV: 103 fL — ABNORMAL HIGH (ref 81–101)
MONO#: 1.2 10*3/uL — ABNORMAL HIGH (ref 0.1–0.9)
MONO%: 16.8 % — AB (ref 0.0–13.0)
NEUT#: 4.8 10*3/uL (ref 1.5–6.5)
NEUT%: 66.8 % (ref 39.6–80.0)
Platelets: 202 10*3/uL (ref 145–400)
RBC: 3.68 10*6/uL — ABNORMAL LOW (ref 3.70–5.32)
RDW: 14.7 % (ref 11.1–15.7)
WBC: 7.2 10*3/uL (ref 3.9–10.0)

## 2017-02-23 MED ORDER — ZOLEDRONIC ACID 4 MG/5ML IV CONC
3.0000 mg | Freq: Once | INTRAVENOUS | Status: AC
  Administered 2017-02-23: 3 mg via INTRAVENOUS
  Filled 2017-02-23: qty 3.75

## 2017-02-23 MED ORDER — HEPARIN SOD (PORK) LOCK FLUSH 100 UNIT/ML IV SOLN
500.0000 [IU] | Freq: Once | INTRAVENOUS | Status: AC
  Administered 2017-02-23: 500 [IU] via INTRAVENOUS
  Filled 2017-02-23: qty 5

## 2017-02-23 MED ORDER — SODIUM CHLORIDE 0.9% FLUSH
10.0000 mL | INTRAVENOUS | Status: DC | PRN
  Administered 2017-02-23: 10 mL via INTRAVENOUS
  Filled 2017-02-23: qty 10

## 2017-02-23 NOTE — Patient Instructions (Signed)

## 2017-02-23 NOTE — Progress Notes (Signed)
Hematology and Oncology Follow Up Visit  Jacqueline Rivas [id number] 1932-05-14 83 y.o. 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 is back for followup. She is doing okay. She really has no specific complaints. She is worried about her breast cancer coming back. There is a "lump" on the right chest wall where she had her mastectomy. I looked at this "lump". This is some fatty tissue. I don't think this is anything related to breast cancer recurrence.  She has also states some numbness in the right arm. I am not exactly sure what she means by this.  Her last CA 27.29 was normal at 20.  Her myeloma is doing quite well. There is no monoclonal spike in her serum. Her IgA level has been normal. Back in January her IgA level was 233 mg/dL. Her kappa light chain was 3.3 mg/dL.  Her cardiac status seems we don't pretty well. Apparently, she really is not taking a lot of her medications now. On my sure she even sees a cardiologist.  She is living down in [place]. She is doing well down there from my perspective.  Currently, her performance status is ECOG 1-.2 Medications:  Current Outpatient Prescriptions:  .  aspirin EC 81 MG tablet, Take 162 mg by mouth daily., Disp: , Rfl:  .  calcium carbonate (OS-CAL) 600 MG TABS tablet, Take 1,200 mg by mouth daily with breakfast., Disp: , Rfl:  .  Coenzyme Q10 (CO Q 10) 100 MG CAPS, Take 100 mg by mouth every morning. , Disp: , Rfl:  .  lenalidomide (REVLIMID) 15 MG capsule, Take 1 capsule (15 mg total) by mouth daily. Take for 21 days. Off for 7 days.  BWIO#[id number], Disp: 21 capsule, Rfl: 0 .  magnesium 30 MG tablet, Take 30 mg by mouth every morning., Disp: , Rfl:  .  potassium chloride SA (K-DUR,KLOR-CON) 20 MEQ tablet, Take 1 tablet (20 mEq total) by mouth daily. (Patient taking differently: Take 20 mEq by mouth daily. ), Disp: 90 tablet, Rfl: 3 No current facility-administered medications for this visit.   Facility-Administered Medications Ordered in Other Visits:  .  sodium chloride 0.9 % injection 10 mL, 10 mL, Intracatheter, PRN, [name], MD, 10 mL at 04/16/14 1332  Allergies: No Known Allergies  Past Medical History, Surgical history, Social history, and Family History were reviewed and updated.  Review of Systems: As above  Physical Exam:  weight is 152 lb 8 oz (69.2 kg). Her oral temperature is 98.6 F (37 C). Her blood pressure is 156/68 (abnormal) and her pulse is 72. Her respiration is 16 and oxygen saturation is 100%.   Elderly, thin white female in no obvious distress. Head and neck exam shows no ocular or oral lesions. There are no palpable cervical or supraclavicular lymph nodes. Lungs are clear. Cardiac exam regular rate and rhythm. She has an occasional extra beat. She has a 1/6 systolic ejection murmur. Abdomen is soft. She has good bowel sounds. There is no fluid wave. There is  no palpable liver or spleen tip. Extremities shows no clubbing, cyanosis or edema. She has some trace edema in her lower legs. She has some age-related osteoarthritic changes. Breast exam shows left breast with no masses, edema or erythema. There is no left axillary adenopathy. Right chest wall shows a well-healed mastectomy. No right chest wall nodules are noted. There is no right axillary adenopathy. Neurological exam shows no focal neurological deficits.  Lab Results  Component Value Date   WBC 7.2 02/23/2017   HGB 13.2 02/23/2017   HCT 37.8 02/23/2017   MCV 103 (H) 02/23/2017   PLT 202 02/23/2017     Chemistry      Component  Value Date/Time   NA 139 02/23/2017 0945   K 4.2 02/23/2017 0945   CL 105 02/23/2017 0945   CO2 25 02/23/2017 0945   BUN 15 02/23/2017 0945   CREATININE 1.1 02/23/2017 0945      Component Value Date/Time   CALCIUM 9.5 02/23/2017 0945   CALCIUM 11.1 (H) 11/13/2012 1526   ALKPHOS 67 02/23/2017 0945   AST 22 02/23/2017 0945   ALT 26 02/23/2017 0945   BILITOT 1.00 02/23/2017 0945         Impression and Plan: Jacqueline Rivas is 83 year old white female. She has both IgA kappa myeloma and stage II breast cancer. She continues to look quite good. She's responding. She has no obvious monoclonal spike with her last studies.   We are now going on  4 years When we first saw her.  Again, I do not see any issues with respect to her breast cancer. The myeloma is doing quite well. She is doing well on the Revlimid. I do so but she is taking it.  She will get her Zometa today. She thinks Zometa might be causing what she her problems. I try to reassure her that Zometa does not cause dizziness or eye problems or cataracts.  I would like to see her back in 4 months.  I spent about 30 minutes with she and her son.     [name], MD 5/8/201811:05 AM

## 2017-02-24 LAB — CANCER ANTIGEN 27.29: CA 27.29: 18.1 U/mL (ref 0.0–38.6)

## 2017-02-24 LAB — PROTEIN ELECTROPHORESIS, SERUM, WITH REFLEX
A/G Ratio: 1.2 (ref 0.7–1.7)
Albumin: 3.3 g/dL (ref 2.9–4.4)
Alpha 1: 0.3 g/dL (ref 0.0–0.4)
Alpha 2: 0.8 g/dL (ref 0.4–1.0)
Beta: 0.9 g/dL (ref 0.7–1.3)
Gamma Globulin: 0.7 g/dL (ref 0.4–1.8)
Globulin, Total: 2.7 g/dL (ref 2.2–3.9)
Total Protein: 6 g/dL (ref 6.0–8.5)

## 2017-02-24 LAB — KAPPA/LAMBDA LIGHT CHAINS
IG KAPPA FREE LIGHT CHAIN: 28.6 mg/L — AB (ref 3.3–19.4)
Ig Lambda Free Light Chain: 21 mg/L (ref 5.7–26.3)
Kappa/Lambda FluidC Ratio: 1.36 (ref 0.26–1.65)

## 2017-02-24 LAB — IGG, IGA, IGM
IgA, Qn, Serum: 225 mg/dL (ref 64–422)
IgG, Qn, Serum: 612 mg/dL — ABNORMAL LOW (ref 700–1600)
IgM, Qn, Serum: 84 mg/dL (ref 26–217)

## 2017-02-25 ENCOUNTER — Telehealth: Payer: Self-pay | Admitting: *Deleted

## 2017-02-25 NOTE — Telephone Encounter (Addendum)
Message left for patient  ----- Message from [name], MD sent at 02/24/2017  9:34 AM EDT ----- Call - tumor level is still normal!!!  Dierdre Searles, MD  P Onc Nurse [facility]        Call - NO myeloma found in the blood!! pete

## 2017-07-01 ENCOUNTER — Other Ambulatory Visit: Payer: Self-pay | Admitting: *Deleted

## 2017-07-01 ENCOUNTER — Other Ambulatory Visit (HOSPITAL_BASED_OUTPATIENT_CLINIC_OR_DEPARTMENT_OTHER): Payer: Medicare Other

## 2017-07-01 ENCOUNTER — Ambulatory Visit: Payer: Medicare Other

## 2017-07-01 ENCOUNTER — Ambulatory Visit (HOSPITAL_BASED_OUTPATIENT_CLINIC_OR_DEPARTMENT_OTHER): Payer: Medicare Other

## 2017-07-01 ENCOUNTER — Ambulatory Visit (HOSPITAL_BASED_OUTPATIENT_CLINIC_OR_DEPARTMENT_OTHER): Payer: Medicare Other | Admitting: Hematology & Oncology

## 2017-07-01 VITALS — BP 148/69 | HR 80 | Temp 98.1°F | Resp 20 | Wt 152.0 lb

## 2017-07-01 DIAGNOSIS — I427 Cardiomyopathy due to drug and external agent: Secondary | ICD-10-CM

## 2017-07-01 DIAGNOSIS — C9000 Multiple myeloma not having achieved remission: Secondary | ICD-10-CM

## 2017-07-01 DIAGNOSIS — C50911 Malignant neoplasm of unspecified site of right female breast: Secondary | ICD-10-CM | POA: Diagnosis not present

## 2017-07-01 DIAGNOSIS — C9001 Multiple myeloma in remission: Secondary | ICD-10-CM

## 2017-07-01 DIAGNOSIS — C773 Secondary and unspecified malignant neoplasm of axilla and upper limb lymph nodes: Secondary | ICD-10-CM

## 2017-07-01 DIAGNOSIS — S22000A Wedge compression fracture of unspecified thoracic vertebra, initial encounter for closed fracture: Secondary | ICD-10-CM

## 2017-07-01 DIAGNOSIS — D630 Anemia in neoplastic disease: Secondary | ICD-10-CM

## 2017-07-01 LAB — CMP (CANCER CENTER ONLY)
ALBUMIN: 3.8 g/dL (ref 3.3–5.5)
ALK PHOS: 69 U/L (ref 26–84)
ALT(SGPT): 17 U/L (ref 10–47)
AST: 25 U/L (ref 11–38)
BILIRUBIN TOTAL: 1 mg/dL (ref 0.20–1.60)
BUN, Bld: 21 mg/dL (ref 7–22)
CALCIUM: 9.4 mg/dL (ref 8.0–10.3)
CO2: 25 mEq/L (ref 18–33)
Chloride: 106 mEq/L (ref 98–108)
Creat: 1.4 mg/dl — ABNORMAL HIGH (ref 0.6–1.2)
GLUCOSE: 97 mg/dL (ref 73–118)
POTASSIUM: 4.2 meq/L (ref 3.3–4.7)
Sodium: 139 mEq/L (ref 128–145)
TOTAL PROTEIN: 6.7 g/dL (ref 6.4–8.1)

## 2017-07-01 LAB — CBC WITH DIFFERENTIAL (CANCER CENTER ONLY)
BASO#: 0.1 10*3/uL (ref 0.0–0.2)
BASO%: 0.7 % (ref 0.0–2.0)
EOS ABS: 0 10*3/uL (ref 0.0–0.5)
EOS%: 0.6 % (ref 0.0–7.0)
HCT: 41.4 % (ref 34.8–46.6)
HGB: 13.9 g/dL (ref 11.6–15.9)
LYMPH#: 1 10*3/uL (ref 0.9–3.3)
LYMPH%: 14 % (ref 14.0–48.0)
MCH: 34.2 pg — AB (ref 26.0–34.0)
MCHC: 33.6 g/dL (ref 32.0–36.0)
MCV: 102 fL — ABNORMAL HIGH (ref 81–101)
MONO#: 0.7 10*3/uL (ref 0.1–0.9)
MONO%: 10.2 % (ref 0.0–13.0)
NEUT#: 5.3 10*3/uL (ref 1.5–6.5)
NEUT%: 74.5 % (ref 39.6–80.0)
Platelets: 191 10*3/uL (ref 145–400)
RBC: 4.06 10*6/uL (ref 3.70–5.32)
RDW: 13.4 % (ref 11.1–15.7)
WBC: 7.1 10*3/uL (ref 3.9–10.0)

## 2017-07-01 MED ORDER — LENALIDOMIDE 15 MG PO CAPS: 15.0000 mg | ORAL_CAPSULE | Freq: Every day | ORAL | 0 refills | Status: DC

## 2017-07-01 MED ORDER — ZOLEDRONIC ACID 4 MG/5ML IV CONC
3.0000 mg | Freq: Once | INTRAVENOUS | Status: AC
  Administered 2017-07-01: 3 mg via INTRAVENOUS
  Filled 2017-07-01: qty 3.75

## 2017-07-01 NOTE — Patient Instructions (Signed)

## 2017-07-01 NOTE — Progress Notes (Signed)
Hematology and Oncology Follow Up Visit  Jacqueline Rivas [id number] 02/10/1932 83 y.o. 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 is back for followup. Unfortunately, the big news his hat she has not been taking her Revlimid for 4 months. We last saw her back in May. Since then, she is not been taking the Revlimid. She just did not feel like taking it.  She has done this in the past.  We last saw her, her M spike was not found in the blood. Her IgA level was 225 mg/dL. Her kappa light chain was 2.9 mg/dL.  I talked to her about this. Her son did not know that she was not taking it. We both talked to her about this. She will make a concerted effort to take the Revlimid again.  She has not had any issues with her breast cancer. She's had no right chest wall issues. She's had no right shoulder pain.  She's had no problems with fever. She's had no bleeding. He's been no change in bowel or bladder habits.  She still is not exercising. She lives in an assisted-living community. She just does not want to exercise.  Overall, I would say her performance status is ECOG 2.  Medications:  Current Outpatient Prescriptions:  .  aspirin EC 81 MG tablet, Take 162 mg by mouth daily., Disp: , Rfl:  .  calcium carbonate (OS-CAL) 600 MG TABS tablet, Take 1,200 mg by mouth daily with breakfast., Disp: , Rfl:  .  Coenzyme Q10 (CO Q 10) 100 MG CAPS, Take 100 mg by mouth every morning. , Disp: , Rfl:  .  magnesium 30 MG tablet, Take 30 mg by mouth every morning., Disp: , Rfl:  .  potassium chloride SA  (K-DUR,KLOR-CON) 20 MEQ tablet, Take 1 tablet (20 mEq total) by mouth daily. (Patient taking differently: Take 20 mEq by mouth daily. ), Disp: 90 tablet, Rfl: 3 .  lenalidomide (REVLIMID) 15 MG capsule, Take 1 capsule (15 mg total) by mouth daily. Take for 21 days. Off for 7 days. [id number], Disp: 21 capsule, Rfl: 0 No current facility-administered medications for this visit.   Facility-Administered Medications Ordered in Other Visits:  .  sodium chloride 0.9 % injection 10 mL, 10 mL, Intracatheter, PRN, [name], MD, 10 mL at 04/16/14 1332  Allergies: No Known Allergies  Past Medical History, Surgical history, Social history, and Family History were reviewed and updated.  Review of Systems: As stated in the interim history  Physical Exam:  weight is 152 lb (68.9 kg). Her oral temperature is 98.1 F (36.7 C). Her blood pressure is 148/69 (abnormal) and her pulse is 80. Her respiration is 20.   Physical Exam  Constitutional: She is oriented to person, place, and time.  HENT:  Head: Normocephalic and atraumatic.  Mouth/Throat: Oropharynx is clear and moist.  Eyes: Pupils are equal, round, and reactive to light. EOM are normal.  Neck: Normal range of motion.  Cardiovascular: Normal rate, regular rhythm and normal heart sounds.  Pulmonary/Chest: Effort normal and breath sounds normal.  Abdominal: Soft. Bowel sounds are normal.  Musculoskeletal: Normal range of motion. She exhibits no edema, tenderness or deformity.  Lymphadenopathy:    She has no cervical adenopathy.  Neurological: She is alert and oriented to person, place, and time.  Skin: Skin is warm and dry. No rash noted. No erythema.  Psychiatric: She has a normal mood and affect. Her behavior is normal. Judgment and thought content normal.  Vitals reviewed.    Lab Results  Component Value Date   WBC 7.1 07/01/2017   HGB 13.9 07/01/2017   HCT 41.4 07/01/2017   MCV 102 (H) 07/01/2017   PLT 191 07/01/2017      Chemistry      Component Value Date/Time   NA 139 07/01/2017 1003   K 4.2 07/01/2017 1003   CL 106 07/01/2017 1003   CO2 25 07/01/2017 1003   BUN 21 07/01/2017 1003   CREATININE 1.4 (H) 07/01/2017 1003      Component Value Date/Time   CALCIUM 9.4 07/01/2017 1003   CALCIUM 11.1 (H) 11/13/2012 1526   ALKPHOS 69 07/01/2017 1003   AST 25 07/01/2017 1003   ALT 17 07/01/2017 1003   BILITOT 1.00 07/01/2017 1003         Impression and Plan: Jacqueline Rivas is 83 year old white female. She has both IgA kappa myeloma and stage II breast cancer.   I suppose that she did not shock me that she did not take her Revlimid all summer. This typically is what she does. She feels that she can do what she likes to do. Thankfully, I would have to believe that there has not been much change with her myeloma. We will see what her monoclonal studies show.  I spoke to her at length about this. I spoke to her for about 30 minutes. I explained to her why it was essential that she take the Revlimid. It has been working because we have not found a monoclonal spike in her blood.  She promises to restart this.  I will like to see her back in 6 weeks now. I then we have to have a little bit more of a close follow-up for her given her proclivity to do what she would like to do.  Marland Kitchen [name], MD 9/13/20182:58 PM

## 2017-07-01 NOTE — Patient Instructions (Signed)
Implanted Port Insertion, Care After °This sheet gives you information about how to care for yourself after your procedure. Your health care provider may also give you more specific instructions. If you have problems or questions, contact your health care provider. °What can I expect after the procedure? °After your procedure, it is common to have: °· Discomfort at the port insertion site. °· Bruising on the skin over the port. This should improve over 3-4 days. ° °Follow these instructions at home: °Port care °· After your port is placed, you will get a manufacturer's information card. The card has information about your port. Keep this card with you at all times. °· Take care of the port as told by your health care provider. Ask your health care provider if you or a family member can get training for taking care of the port at home. A home health care nurse may also take care of the port. °· Make sure to remember what type of port you have. °Incision care °· Follow instructions from your health care provider about how to take care of your port insertion site. Make sure you: °? Wash your hands with soap and water before you change your bandage (dressing). If soap and water are not available, use hand sanitizer. °? Change your dressing as told by your health care provider. °? Leave stitches (sutures), skin glue, or adhesive strips in place. These skin closures may need to stay in place for 2 weeks or longer. If adhesive strip edges start to loosen and curl up, you may trim the loose edges. Do not remove adhesive strips completely unless your health care provider tells you to do that. °· Check your port insertion site every day for signs of infection. Check for: °? More redness, swelling, or pain. °? More fluid or blood. °? Warmth. °? Pus or a bad smell. °General instructions °· Do not take baths, swim, or use a hot tub until your health care provider approves. °· Do not lift anything that is heavier than 10 lb (4.5  kg) for a week, or as told by your health care provider. °· Ask your health care provider when it is okay to: °? Return to work or school. °? Resume usual physical activities or sports. °· Do not drive for 24 hours if you were given a medicine to help you relax (sedative). °· Take over-the-counter and prescription medicines only as told by your health care provider. °· Wear a medical alert bracelet in case of an emergency. This will tell any health care providers that you have a port. °· Keep all follow-up visits as told by your health care provider. This is important. °Contact a health care provider if: °· You cannot flush your port with saline as directed, or you cannot draw blood from the port. °· You have a fever or chills. °· You have more redness, swelling, or pain around your port insertion site. °· You have more fluid or blood coming from your port insertion site. °· Your port insertion site feels warm to the touch. °· You have pus or a bad smell coming from the port insertion site. °Get help right away if: °· You have chest pain or shortness of breath. °· You have bleeding from your port that you cannot control. °Summary °· Take care of the port as told by your health care provider. °· Change your dressing as told by your health care provider. °· Keep all follow-up visits as told by your health care provider. °  This information is not intended to replace advice given to you by your health care provider. Make sure you discuss any questions you have with your health care provider. °Document Released: 07/26/2013 Document Revised: 08/26/2016 Document Reviewed: 08/26/2016 °Elsevier Interactive Patient Education © 2017 Elsevier Inc. ° °

## 2017-07-02 LAB — CANCER ANTIGEN 27.29: CAN 27.29: 16.7 U/mL (ref 0.0–38.6)

## 2017-07-02 LAB — PROTEIN ELECTROPHORESIS, SERUM, WITH REFLEX
A/G Ratio: 1.2 (ref 0.7–1.7)
ALBUMIN: 3.3 g/dL (ref 2.9–4.4)
ALPHA 2: 0.8 g/dL (ref 0.4–1.0)
Alpha 1: 0.2 g/dL (ref 0.0–0.4)
BETA: 1 g/dL (ref 0.7–1.3)
GAMMA GLOBULIN: 0.8 g/dL (ref 0.4–1.8)
Globulin, Total: 2.8 g/dL (ref 2.2–3.9)
Total Protein: 6.1 g/dL (ref 6.0–8.5)

## 2017-07-02 LAB — IGG, IGA, IGM
IGA/IMMUNOGLOBULIN A, SERUM: 210 mg/dL (ref 64–422)
IGM (IMMUNOGLOBIN M), SRM: 109 mg/dL (ref 26–217)
IgG, Qn, Serum: 693 mg/dL — ABNORMAL LOW (ref 700–1600)

## 2017-07-02 LAB — KAPPA/LAMBDA LIGHT CHAINS
IG KAPPA FREE LIGHT CHAIN: 17.9 mg/L (ref 3.3–19.4)
Ig Lambda Free Light Chain: 15.1 mg/L (ref 5.7–26.3)
Kappa/Lambda FluidC Ratio: 1.19 (ref 0.26–1.65)

## 2017-07-05 ENCOUNTER — Telehealth: Payer: Self-pay | Admitting: *Deleted

## 2017-07-05 NOTE — Telephone Encounter (Signed)
-----   Message from [name], MD sent at 07/02/2017  4:52 PM EDT ----- Call her son - NO myeloma in the blood!! This is a miracle!!!  pete

## 2017-08-12 ENCOUNTER — Ambulatory Visit: Payer: Medicare Other

## 2017-08-12 ENCOUNTER — Other Ambulatory Visit: Payer: Medicare Other

## 2017-08-12 ENCOUNTER — Ambulatory Visit: Payer: Medicare Other | Admitting: Family

## 2017-09-23 ENCOUNTER — Other Ambulatory Visit (HOSPITAL_BASED_OUTPATIENT_CLINIC_OR_DEPARTMENT_OTHER): Payer: Medicare Other

## 2017-09-23 ENCOUNTER — Encounter: Payer: Self-pay | Admitting: Hematology & Oncology

## 2017-09-23 ENCOUNTER — Ambulatory Visit (HOSPITAL_BASED_OUTPATIENT_CLINIC_OR_DEPARTMENT_OTHER): Payer: Medicare Other

## 2017-09-23 ENCOUNTER — Ambulatory Visit: Payer: Medicare Other

## 2017-09-23 ENCOUNTER — Ambulatory Visit (HOSPITAL_BASED_OUTPATIENT_CLINIC_OR_DEPARTMENT_OTHER): Payer: Medicare Other | Admitting: Hematology & Oncology

## 2017-09-23 ENCOUNTER — Other Ambulatory Visit: Payer: Self-pay

## 2017-09-23 VITALS — BP 151/80 | HR 105 | Temp 98.4°F | Resp 20 | Wt 155.0 lb

## 2017-09-23 DIAGNOSIS — C50911 Malignant neoplasm of unspecified site of right female breast: Secondary | ICD-10-CM

## 2017-09-23 DIAGNOSIS — Z170 Estrogen receptor positive status [ER+]: Secondary | ICD-10-CM | POA: Diagnosis not present

## 2017-09-23 DIAGNOSIS — C9001 Multiple myeloma in remission: Secondary | ICD-10-CM

## 2017-09-23 DIAGNOSIS — C773 Secondary and unspecified malignant neoplasm of axilla and upper limb lymph nodes: Secondary | ICD-10-CM

## 2017-09-23 DIAGNOSIS — C9000 Multiple myeloma not having achieved remission: Secondary | ICD-10-CM | POA: Diagnosis not present

## 2017-09-23 DIAGNOSIS — S22000A Wedge compression fracture of unspecified thoracic vertebra, initial encounter for closed fracture: Secondary | ICD-10-CM

## 2017-09-23 LAB — CBC WITH DIFFERENTIAL (CANCER CENTER ONLY)
BASO#: 0.1 10*3/uL (ref 0.0–0.2)
BASO%: 0.7 % (ref 0.0–2.0)
EOS%: 0.3 % (ref 0.0–7.0)
Eosinophils Absolute: 0 10*3/uL (ref 0.0–0.5)
HCT: 42 % (ref 34.8–46.6)
HGB: 14.2 g/dL (ref 11.6–15.9)
LYMPH#: 0.9 10*3/uL (ref 0.9–3.3)
LYMPH%: 13.4 % — ABNORMAL LOW (ref 14.0–48.0)
MCH: 34.4 pg — ABNORMAL HIGH (ref 26.0–34.0)
MCHC: 33.8 g/dL (ref 32.0–36.0)
MCV: 102 fL — ABNORMAL HIGH (ref 81–101)
MONO#: 0.7 10*3/uL (ref 0.1–0.9)
MONO%: 9.6 % (ref 0.0–13.0)
NEUT%: 76 % (ref 39.6–80.0)
NEUTROS ABS: 5.3 10*3/uL (ref 1.5–6.5)
PLATELETS: 198 10*3/uL (ref 145–400)
RBC: 4.13 10*6/uL (ref 3.70–5.32)
RDW: 13.7 % (ref 11.1–15.7)
WBC: 7 10*3/uL (ref 3.9–10.0)

## 2017-09-23 LAB — CMP (CANCER CENTER ONLY)
ALT(SGPT): 15 U/L (ref 10–47)
AST: 23 U/L (ref 11–38)
Albumin: 3.8 g/dL (ref 3.3–5.5)
Alkaline Phosphatase: 61 U/L (ref 26–84)
BUN: 16 mg/dL (ref 7–22)
CHLORIDE: 104 meq/L (ref 98–108)
CO2: 26 meq/L (ref 18–33)
CREATININE: 1.4 mg/dL — AB (ref 0.6–1.2)
Calcium: 9.7 mg/dL (ref 8.0–10.3)
GLUCOSE: 100 mg/dL (ref 73–118)
POTASSIUM: 4 meq/L (ref 3.3–4.7)
SODIUM: 143 meq/L (ref 128–145)
TOTAL PROTEIN: 6.9 g/dL (ref 6.4–8.1)
Total Bilirubin: 0.8 mg/dl (ref 0.20–1.60)

## 2017-09-23 MED ORDER — SODIUM CHLORIDE 0.9% FLUSH
10.0000 mL | Freq: Once | INTRAVENOUS | Status: AC
  Administered 2017-09-23: 10 mL
  Filled 2017-09-23: qty 10

## 2017-09-23 MED ORDER — ZOLEDRONIC ACID 4 MG/5ML IV CONC
3.0000 mg | Freq: Once | INTRAVENOUS | Status: AC
  Administered 2017-09-23: 3 mg via INTRAVENOUS
  Filled 2017-09-23: qty 3.75

## 2017-09-23 MED ORDER — SODIUM CHLORIDE 0.9% FLUSH
10.0000 mL | INTRAVENOUS | Status: DC | PRN
  Administered 2017-09-23: 10 mL via INTRAVENOUS
  Filled 2017-09-23: qty 10

## 2017-09-23 MED ORDER — HEPARIN SOD (PORK) LOCK FLUSH 100 UNIT/ML IV SOLN
500.0000 [IU] | Freq: Once | INTRAVENOUS | Status: AC
  Administered 2017-09-23: 500 [IU] via INTRAVENOUS
  Filled 2017-09-23: qty 5

## 2017-09-23 NOTE — Patient Instructions (Signed)
Zoledronic Acid injection (Hypercalcemia, Oncology) (Zometa) What is this medicine? ZOLEDRONIC ACID (ZOE le dron ik AS id) lowers the amount of calcium loss from bone. It is used to treat too much calcium in your blood from cancer. It is also used to prevent complications of cancer that has spread to the bone. This medicine may be used for other purposes; ask your health care provider or pharmacist if you have questions. COMMON BRAND NAME(S): Zometa What should I tell my health care provider before I take this medicine? They need to know if you have any of these conditions: -aspirin-sensitive asthma -cancer, especially if you are receiving medicines used to treat cancer -dental disease or wear dentures -infection -kidney disease -receiving corticosteroids like dexamethasone or prednisone -an unusual or allergic reaction to zoledronic acid, other medicines, foods, dyes, or preservatives -pregnant or trying to get pregnant -breast-feeding How should I use this medicine? This medicine is for infusion into a vein. It is given by a health care professional in a hospital or clinic setting. Talk to your pediatrician regarding the use of this medicine in children. Special care may be needed. Overdosage: If you think you have taken too much of this medicine contact a poison control center or emergency room at once. NOTE: This medicine is only for you. Do not share this medicine with others. What if I miss a dose? It is important not to miss your dose. Call your doctor or health care professional if you are unable to keep an appointment. What may interact with this medicine? -certain antibiotics given by injection -NSAIDs, medicines for pain and inflammation, like ibuprofen or naproxen -some diuretics like bumetanide, furosemide -teriparatide -thalidomide This list may not describe all possible interactions. Give your health care provider a list of all the medicines, herbs, non-prescription drugs,  or dietary supplements you use. Also tell them if you smoke, drink alcohol, or use illegal drugs. Some items may interact with your medicine. What should I watch for while using this medicine? Visit your doctor or health care professional for regular checkups. It may be some time before you see the benefit from this medicine. Do not stop taking your medicine unless your doctor tells you to. Your doctor may order blood tests or other tests to see how you are doing. Women should inform their doctor if they wish to become pregnant or think they might be pregnant. There is a potential for serious side effects to an unborn child. Talk to your health care professional or pharmacist for more information. You should make sure that you get enough calcium and vitamin D while you are taking this medicine. Discuss the foods you eat and the vitamins you take with your health care professional. Some people who take this medicine have severe bone, joint, and/or muscle pain. This medicine may also increase your risk for jaw problems or a broken thigh bone. Tell your doctor right away if you have severe pain in your jaw, bones, joints, or muscles. Tell your doctor if you have any pain that does not go away or that gets worse. Tell your dentist and dental surgeon that you are taking this medicine. You should not have major dental surgery while on this medicine. See your dentist to have a dental exam and fix any dental problems before starting this medicine. Take good care of your teeth while on this medicine. Make sure you see your dentist for regular follow-up appointments. What side effects may I notice from receiving this medicine? Side effects   that you should report to your doctor or health care professional as soon as possible: -allergic reactions like skin rash, itching or hives, swelling of the face, lips, or tongue -anxiety, confusion, or depression -breathing problems -changes in vision -eye pain -feeling faint  or lightheaded, falls -jaw pain, especially after dental work -mouth sores -muscle cramps, stiffness, or weakness -redness, blistering, peeling or loosening of the skin, including inside the mouth -trouble passing urine or change in the amount of urine Side effects that usually do not require medical attention (report to your doctor or health care professional if they continue or are bothersome): -bone, joint, or muscle pain -constipation -diarrhea -fever -hair loss -irritation at site where injected -loss of appetite -nausea, vomiting -stomach upset -trouble sleeping -trouble swallowing -weak or tired This list may not describe all possible side effects. Call your doctor for medical advice about side effects. You may report side effects to FDA at 1-800-FDA-1088. Where should I keep my medicine? This drug is given in a hospital or clinic and will not be stored at home. NOTE: This sheet is a summary. It may not cover all possible information. If you have questions about this medicine, talk to your doctor, pharmacist, or health care provider.  2018 Elsevier/Gold Standard (2014-03-03 14:19:39)  

## 2017-09-23 NOTE — Patient Instructions (Signed)
Implanted Port Home Guide An implanted port is a type of central line that is placed under the skin. Central lines are used to provide IV access when treatment or nutrition needs to be given through a person's veins. Implanted ports are used for long-term IV access. An implanted port may be placed because:  You need IV medicine that would be irritating to the small veins in your hands or arms.  You need long-term IV medicines, such as antibiotics.  You need IV nutrition for a long period.  You need frequent blood draws for lab tests.  You need dialysis.  Implanted ports are usually placed in the chest area, but they can also be placed in the upper arm, the abdomen, or the leg. An implanted port has two main parts:  Reservoir. The reservoir is round and will appear as a small, raised area under your skin. The reservoir is the part where a needle is inserted to give medicines or draw blood.  Catheter. The catheter is a thin, flexible tube that extends from the reservoir. The catheter is placed into a large vein. Medicine that is inserted into the reservoir goes into the catheter and then into the vein.  How will I care for my incision site? Do not get the incision site wet. Bathe or shower as directed by your health care provider. How is my port accessed? Special steps must be taken to access the port:  Before the port is accessed, a numbing cream can be placed on the skin. This helps numb the skin over the port site.  Your health care provider uses a sterile technique to access the port. ? Your health care provider must put on a mask and sterile gloves. ? The skin over your port is cleaned carefully with an antiseptic and allowed to dry. ? The port is gently pinched between sterile gloves, and a needle is inserted into the port.  Only "non-coring" port needles should be used to access the port. Once the port is accessed, a blood return should be checked. This helps ensure that the port  is in the vein and is not clogged.  If your port needs to remain accessed for a constant infusion, a clear (transparent) bandage will be placed over the needle site. The bandage and needle will need to be changed every week, or as directed by your health care provider.  Keep the bandage covering the needle clean and dry. Do not get it wet. Follow your health care provider's instructions on how to take a shower or bath while the port is accessed.  If your port does not need to stay accessed, no bandage is needed over the port.  What is flushing? Flushing helps keep the port from getting clogged. Follow your health care provider's instructions on how and when to flush the port. Ports are usually flushed with saline solution or a medicine called heparin. The need for flushing will depend on how the port is used.  If the port is used for intermittent medicines or blood draws, the port will need to be flushed: ? After medicines have been given. ? After blood has been drawn. ? As part of routine maintenance.  If a constant infusion is running, the port may not need to be flushed.  How long will my port stay implanted? The port can stay in for as long as your health care provider thinks it is needed. When it is time for the port to come out, surgery will be   done to remove it. The procedure is similar to the one performed when the port was put in. When should I seek immediate medical care? When you have an implanted port, you should seek immediate medical care if:  You notice a bad smell coming from the incision site.  You have swelling, redness, or drainage at the incision site.  You have more swelling or pain at the port site or the surrounding area.  You have a fever that is not controlled with medicine.  This information is not intended to replace advice given to you by your health care provider. Make sure you discuss any questions you have with your health care provider. Document  Released: 10/05/2005 Document Revised: 03/12/2016 Document Reviewed: 06/12/2013 Elsevier Interactive Patient Education  2017 Elsevier Inc.  

## 2017-09-23 NOTE — Progress Notes (Signed)
Hematology and Oncology Follow Up Visit  Jacqueline Rivas [id number] [birth year] 83 y.o. 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 is back for followup.  To no surprise, she still is not taking her Revlimid.  She is not taking it since May.  Surprisingly, she does not have a monoclonal spike in her blood.  When she was last here back in September, there was no M spike in her blood.  Her IgA level was 210 mg/dL.  Her kappa light chain was 1.8 mg/dL.  She had a nice Thanksgiving.  She is not really exercising.  She pretty much does what she likes to do.  It is hard to say how much medication she really is taking.  She has not had any issues with nausea or vomiting.  She has had no problems with respect to her breast cancer coming back.  Her last CA 27.29 in September was 17.  She has had no change in bowel or bladder habits.  She has had some leg swelling.  She is not taking her vitamin D.  Overall, her performance status is ECOG 2.  Medications:  Current Outpatient Medications:  .  aspirin EC 81 MG tablet, Take 162 mg by mouth daily., Disp: , Rfl:  .  calcium carbonate (OS-CAL) 600 MG TABS tablet, Take 1,200 mg by mouth daily with breakfast., Disp: , Rfl:  .  Cholecalciferol (VITAMIN D3) 2000 units TABS, , Disp: , Rfl:  .  Coenzyme Q10 (CO Q 10) 100 MG CAPS, Take 100 mg by mouth every morning. , Disp: , Rfl:  .  lenalidomide (REVLIMID) 15 MG capsule, Take 1 capsule (15 mg total) by mouth daily. Take for 21 days. Off for 7 days. [id number], Disp: 21  capsule, Rfl: 0 .  magnesium 30 MG tablet, Take 30 mg by mouth every morning., Disp: , Rfl:  .  Potassium 99 MG TABS, , Disp: , Rfl:  .  potassium chloride SA (K-DUR,KLOR-CON) 20 MEQ tablet, Take 1 tablet (20 mEq total) by mouth daily. (Patient taking differently: Take 20 mEq by mouth daily. ), Disp: 90 tablet, Rfl: 3 .  zolendronic acid (ZOMETA) 4 MG/5ML injection, Inject into the vein., Disp: , Rfl:  No current facility-administered medications for this visit.   Facility-Administered Medications Ordered in Other Visits:  .  sodium chloride 0.9 % injection 10 mL, 10 mL, Intracatheter, PRN, [name], MD, 10 mL at 04/16/14 1332 .  sodium chloride flush (NS) 0.9 % injection 10 mL, 10 mL, Intravenous, PRN, [name], MD, 10 mL at 09/23/17 1233  Allergies: No Known Allergies  Past Medical History, Surgical history, Social history, and Family History were reviewed and updated.  Review of Systems: As stated in the interim history  Physical Exam:  weight is 155 lb (70.3 kg). Her oral temperature is 98.4 F (36.9 C). Her blood pressure is 151/80 (abnormal) and her pulse is 105 (abnormal). Her respiration  is 20 and oxygen saturation is 98%.   Physical Exam  Constitutional: She is oriented to person, place, and time.  HENT:  Head: Normocephalic and atraumatic.  Mouth/Throat: Oropharynx is clear and moist.  Eyes: EOM are normal. Pupils are equal, round, and reactive to light.  Neck: Normal range of motion.  Cardiovascular: Normal rate, regular rhythm and normal heart sounds.  Pulmonary/Chest: Effort normal and breath sounds normal.  Abdominal: Soft. Bowel sounds are normal.  Musculoskeletal: Normal range of motion. She exhibits no edema, tenderness or deformity.  Lymphadenopathy:    She has no cervical adenopathy.  Neurological: She is alert and oriented to person, place, and time.  Skin: Skin is warm and dry. No rash noted. No erythema.  Psychiatric: She has a normal mood  and affect. Her behavior is normal. Judgment and thought content normal.  Vitals reviewed.    Lab Results  Component Value Date   WBC 7.0 09/23/2017   HGB 14.2 09/23/2017   HCT 42.0 09/23/2017   MCV 102 (H) 09/23/2017   PLT 198 09/23/2017     Chemistry      Component Value Date/Time   NA 143 09/23/2017 1019   K 4.0 09/23/2017 1019   CL 104 09/23/2017 1019   CO2 26 09/23/2017 1019   BUN 16 09/23/2017 1019   CREATININE 1.4 (H) 09/23/2017 1019      Component Value Date/Time   CALCIUM 9.7 09/23/2017 1019   CALCIUM 11.1 (H) 11/13/2012 1526   ALKPHOS 61 09/23/2017 1019   AST 23 09/23/2017 1019   ALT 15 09/23/2017 1019   BILITOT 0.80 09/23/2017 1019         Impression and Plan: Jacqueline Rivas is 83 year old white female. She has both IgA kappa myeloma and stage II breast cancer.   I told her to stop taking the Revlimid.  If she has not taken it for 7 months, I am not sure what benefit she would gain by starting it now.  Even if she does start it, chances are she will stop taking it again.  I do not find any evidence of myeloma recurrence.  I think we would be able to see an M spike develop if her myeloma became "active" again.  She has had no problems with her breast cancer.  I am just amazed that she has done so well and yet is taking very little in the way of medications.  We will see her back in 3 more months.  She gets her Zometa today.    Marland Kitchen [name], MD 12/6/201812:57 PM

## 2017-09-24 LAB — IGG, IGA, IGM
IGA/IMMUNOGLOBULIN A, SERUM: 216 mg/dL (ref 64–422)
IGM (IMMUNOGLOBIN M), SRM: 111 mg/dL (ref 26–217)
IgG, Qn, Serum: 710 mg/dL (ref 700–1600)

## 2017-09-24 LAB — KAPPA/LAMBDA LIGHT CHAINS
IG KAPPA FREE LIGHT CHAIN: 18.2 mg/L (ref 3.3–19.4)
Ig Lambda Free Light Chain: 11.9 mg/L (ref 5.7–26.3)
KAPPA/LAMBDA FLC RATIO: 1.53 (ref 0.26–1.65)

## 2017-09-24 LAB — BETA 2 MICROGLOBULIN, SERUM: BETA 2: 2.9 mg/L — AB (ref 0.6–2.4)

## 2017-09-27 LAB — PROTEIN ELECTROPHORESIS, SERUM, WITH REFLEX
A/G Ratio: 1.3 (ref 0.7–1.7)
ALPHA 2: 0.8 g/dL (ref 0.4–1.0)
Albumin: 3.6 g/dL (ref 2.9–4.4)
Alpha 1: 0.2 g/dL (ref 0.0–0.4)
BETA: 1 g/dL (ref 0.7–1.3)
Gamma Globulin: 0.7 g/dL (ref 0.4–1.8)
Globulin, Total: 2.8 g/dL (ref 2.2–3.9)
Total Protein: 6.4 g/dL (ref 6.0–8.5)

## 2017-09-29 ENCOUNTER — Telehealth: Payer: Self-pay | Admitting: *Deleted

## 2017-09-29 NOTE — Telephone Encounter (Addendum)
Patient's son is aware of results  ----- Message from [name], MD sent at 09/28/2017  8:28 AM EST ----- Call her son - NO myeloma in the blood!!!  Merry Christmas!!!  Laurey Arrow

## 2017-12-23 ENCOUNTER — Other Ambulatory Visit: Payer: Medicare Other

## 2017-12-23 ENCOUNTER — Ambulatory Visit: Payer: Medicare Other

## 2017-12-23 ENCOUNTER — Ambulatory Visit: Payer: Medicare Other | Admitting: Hematology & Oncology

## 2018-01-12 ENCOUNTER — Inpatient Hospital Stay: Payer: Medicare Other

## 2018-01-12 ENCOUNTER — Inpatient Hospital Stay (HOSPITAL_BASED_OUTPATIENT_CLINIC_OR_DEPARTMENT_OTHER): Payer: Medicare Other | Admitting: Hematology & Oncology

## 2018-01-12 ENCOUNTER — Other Ambulatory Visit: Payer: Self-pay | Admitting: *Deleted

## 2018-01-12 ENCOUNTER — Inpatient Hospital Stay: Payer: Medicare Other | Attending: Hematology & Oncology

## 2018-01-12 ENCOUNTER — Other Ambulatory Visit: Payer: Self-pay

## 2018-01-12 ENCOUNTER — Other Ambulatory Visit: Payer: Self-pay | Admitting: Oncology

## 2018-01-12 VITALS — BP 158/72 | HR 82 | Temp 98.3°F | Resp 16 | Wt 159.0 lb

## 2018-01-12 DIAGNOSIS — I427 Cardiomyopathy due to drug and external agent: Secondary | ICD-10-CM

## 2018-01-12 DIAGNOSIS — C773 Secondary and unspecified malignant neoplasm of axilla and upper limb lymph nodes: Principal | ICD-10-CM

## 2018-01-12 DIAGNOSIS — C50911 Malignant neoplasm of unspecified site of right female breast: Secondary | ICD-10-CM

## 2018-01-12 DIAGNOSIS — S22000A Wedge compression fracture of unspecified thoracic vertebra, initial encounter for closed fracture: Secondary | ICD-10-CM

## 2018-01-12 DIAGNOSIS — C9001 Multiple myeloma in remission: Secondary | ICD-10-CM

## 2018-01-12 DIAGNOSIS — C9000 Multiple myeloma not having achieved remission: Secondary | ICD-10-CM | POA: Insufficient documentation

## 2018-01-12 DIAGNOSIS — E876 Hypokalemia: Secondary | ICD-10-CM

## 2018-01-12 DIAGNOSIS — Z171 Estrogen receptor negative status [ER-]: Secondary | ICD-10-CM | POA: Diagnosis not present

## 2018-01-12 DIAGNOSIS — D630 Anemia in neoplastic disease: Secondary | ICD-10-CM

## 2018-01-12 LAB — CMP (CANCER CENTER ONLY)
ALBUMIN: 3.8 g/dL (ref 3.5–5.0)
ALK PHOS: 69 U/L (ref 26–84)
ALT: 16 U/L (ref 10–47)
AST: 23 U/L (ref 11–38)
Anion gap: 10 (ref 5–15)
BILIRUBIN TOTAL: 0.9 mg/dL (ref 0.2–1.6)
BUN: 16 mg/dL (ref 7–22)
CO2: 27 mmol/L (ref 18–33)
CREATININE: 1.5 mg/dL — AB (ref 0.60–1.20)
Calcium: 9.2 mg/dL (ref 8.0–10.3)
Chloride: 106 mmol/L (ref 98–108)
Glucose, Bld: 93 mg/dL (ref 73–118)
Potassium: 4 mmol/L (ref 3.3–4.7)
Sodium: 143 mmol/L (ref 128–145)
Total Protein: 6.4 g/dL (ref 6.4–8.1)

## 2018-01-12 LAB — CBC WITH DIFFERENTIAL (CANCER CENTER ONLY)
BASOS PCT: 1 %
Basophils Absolute: 0.1 10*3/uL (ref 0.0–0.1)
EOS ABS: 0.1 10*3/uL (ref 0.0–0.5)
EOS PCT: 1 %
HCT: 40.1 % (ref 34.8–46.6)
HEMOGLOBIN: 13.4 g/dL (ref 11.6–15.9)
Lymphocytes Relative: 13 %
Lymphs Abs: 1 10*3/uL (ref 0.9–3.3)
MCH: 34 pg (ref 26.0–34.0)
MCHC: 33.4 g/dL (ref 32.0–36.0)
MCV: 101.8 fL — ABNORMAL HIGH (ref 81.0–101.0)
MONOS PCT: 10 %
Monocytes Absolute: 0.7 10*3/uL (ref 0.1–0.9)
NEUTROS PCT: 75 %
Neutro Abs: 5.9 10*3/uL (ref 1.5–6.5)
PLATELETS: 199 10*3/uL (ref 145–400)
RBC: 3.94 MIL/uL (ref 3.70–5.32)
RDW: 13.5 % (ref 11.1–15.7)
WBC Count: 7.7 10*3/uL (ref 3.9–10.0)

## 2018-01-12 MED ORDER — ZOLEDRONIC ACID 4 MG/5ML IV CONC
3.0000 mg | Freq: Once | INTRAVENOUS | Status: AC
  Administered 2018-01-12: 3 mg via INTRAVENOUS
  Filled 2018-01-12: qty 3.75

## 2018-01-12 MED ORDER — HEPARIN SOD (PORK) LOCK FLUSH 100 UNIT/ML IV SOLN
500.0000 [IU] | Freq: Once | INTRAVENOUS | Status: AC
  Administered 2018-01-12: 500 [IU] via INTRAVENOUS
  Filled 2018-01-12: qty 5

## 2018-01-12 MED ORDER — SODIUM CHLORIDE 0.9% FLUSH
10.0000 mL | INTRAVENOUS | Status: DC | PRN
  Administered 2018-01-12: 10 mL via INTRAVENOUS
  Filled 2018-01-12: qty 10

## 2018-01-12 MED ORDER — POTASSIUM CHLORIDE CRYS ER 20 MEQ PO TBCR: 20.0000 meq | EXTENDED_RELEASE_TABLET | Freq: Every day | ORAL | 3 refills | Status: DC

## 2018-01-12 NOTE — Progress Notes (Signed)
Hematology and Oncology Follow Up Visit  Jacqueline Rivas [id number] November 23, 1931 84 y.o. 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 is back for followup.  As always, she is doing quite well.  Despite doing what she wants to do and taking whatever medicine she likes, she is managing.  My only real concern is that she just is not exercising.  Her son always comes up with her.  He keeps trying to get her to exercise and yet she just will not.  She said that she has some fatigue.  She has some leg swelling.  She does not see a cardiologist.  She probably needs to see one because of history of heart failure.  Again, she pretty much does what she feels is best for herself.  She has had no fever.  She has had no cough.  She is had no nausea or vomiting.  We last saw her in December, her M spike was not detectable.  Her IgA level was 260 mg/dL.  Her kappa light chain was 1.8 mg/dL.  Overall, her performance status is ECOG 2.  Medications:  Current Outpatient Medications:  .  aspirin EC 81 MG tablet, Take 162 mg by mouth daily., Disp: , Rfl:  .  calcium carbonate (OS-CAL) 600 MG TABS tablet, Take 1,200 mg by mouth daily with breakfast., Disp: , Rfl:  .  Cholecalciferol (VITAMIN D3) 2000 units TABS, , Disp: , Rfl:  .  Coenzyme Q10 (CO Q 10) 100 MG CAPS, Take 100 mg by mouth every morning. , Disp: , Rfl:  .  lenalidomide (REVLIMID) 15 MG capsule, Take 1 capsule (15 mg total) by mouth daily. Take for 21 days. Off for 7 days. [id number], Disp: 21 capsule, Rfl: 0 .   magnesium 30 MG tablet, Take 30 mg by mouth every morning., Disp: , Rfl:  .  Potassium 99 MG TABS, , Disp: , Rfl:  .  potassium chloride SA (K-DUR,KLOR-CON) 20 MEQ tablet, Take 1 tablet (20 mEq total) by mouth daily., Disp: 90 tablet, Rfl: 3 .  zolendronic acid (ZOMETA) 4 MG/5ML injection, Inject into the vein., Disp: , Rfl:  No current facility-administered medications for this visit.   Facility-Administered Medications Ordered in Other Visits:  .  sodium chloride 0.9 % injection 10 mL, 10 mL, Intracatheter, PRN, [name], MD, 10 mL at 04/16/14 1332 .  sodium chloride flush (NS) 0.9 % injection 10 mL, 10 mL, Intravenous, PRN, [name], MD, 10 mL at 01/12/18 1501  Allergies: No Known Allergies  Past Medical History, Surgical history, Social history, and Family History were reviewed and updated.  Review of Systems: Review of Systems  Constitutional: Negative.   HENT: Negative.   Eyes: Negative.   Respiratory: Negative.   Cardiovascular: Positive for leg swelling.  Gastrointestinal: Negative.   Genitourinary: Negative.   Musculoskeletal: Positive for joint pain and myalgias.  Skin: Negative.   Endo/Heme/Allergies: Negative.   Psychiatric/Behavioral:  Negative.      Physical Exam:  weight is 159 lb (72.1 kg). Her oral temperature is 98.3 F (36.8 C). Her blood pressure is 158/72 (abnormal) and her pulse is 82. Her respiration is 16 and oxygen saturation is 99%.   Physical Exam  Constitutional: She is oriented to person, place, and time.  HENT:  Head: Normocephalic and atraumatic.  Mouth/Throat: Oropharynx is clear and moist.  Eyes: Pupils are equal, round, and reactive to light. EOM are normal.  Neck: Normal range of motion.  Cardiovascular: Normal rate, regular rhythm and normal heart sounds.  Pulmonary/Chest: Effort normal and breath sounds normal.  Abdominal: Soft. Bowel sounds are normal.  Musculoskeletal: Normal range of motion. She exhibits no edema,  tenderness or deformity.  Lymphadenopathy:    She has no cervical adenopathy.  Neurological: She is alert and oriented to person, place, and time.  Skin: Skin is warm and dry. No rash noted. No erythema.  Psychiatric: She has a normal mood and affect. Her behavior is normal. Judgment and thought content normal.  Vitals reviewed.    Lab Results  Component Value Date   WBC 7.7 01/12/2018   HGB 14.2 09/23/2017   HCT 40.1 01/12/2018   MCV 101.8 (H) 01/12/2018   PLT 199 01/12/2018     Chemistry      Component Value Date/Time   NA 143 01/12/2018 1320   NA 143 09/23/2017 1019   K 4.0 01/12/2018 1320   K 4.0 09/23/2017 1019   CL 106 01/12/2018 1320   CL 104 09/23/2017 1019   CO2 27 01/12/2018 1320   CO2 26 09/23/2017 1019   BUN 16 01/12/2018 1320   BUN 16 09/23/2017 1019   CREATININE 1.50 (H) 01/12/2018 1320   CREATININE 1.4 (H) 09/23/2017 1019      Component Value Date/Time   CALCIUM 9.2 01/12/2018 1320   CALCIUM 9.7 09/23/2017 1019   CALCIUM 11.1 (H) 11/13/2012 1526   ALKPHOS 69 01/12/2018 1320   ALKPHOS 61 09/23/2017 1019   AST 23 01/12/2018 1320   ALT 16 01/12/2018 1320   ALT 15 09/23/2017 1019   BILITOT 0.9 01/12/2018 1320         Impression and Plan: Jacqueline Rivas is 84 year old white female. She has both IgA kappa myeloma and stage II breast cancer.   I would be surprised if she had a relapse of the myeloma.  We will see what her myeloma studies look like.    She gets her Zometa today.    As far as her breast cancer is concerned, this really has not been an issue so far.  Her tumor marker has always been stable.  Marland Kitchen [name], MD 3/27/20193:08 PM

## 2018-01-12 NOTE — Patient Instructions (Addendum)
Zoledronic Acid injection (Hypercalcemia, Oncology) (Zometa) What is this medicine? ZOLEDRONIC ACID (ZOE le dron ik AS id) lowers the amount of calcium loss from bone. It is used to treat too much calcium in your blood from cancer. It is also used to prevent complications of cancer that has spread to the bone. This medicine may be used for other purposes; ask your health care provider or pharmacist if you have questions. COMMON BRAND NAME(S): Zometa What should I tell my health care provider before I take this medicine? They need to know if you have any of these conditions: -aspirin-sensitive asthma -cancer, especially if you are receiving medicines used to treat cancer -dental disease or wear dentures -infection -kidney disease -receiving corticosteroids like dexamethasone or prednisone -an unusual or allergic reaction to zoledronic acid, other medicines, foods, dyes, or preservatives -pregnant or trying to get pregnant -breast-feeding How should I use this medicine? This medicine is for infusion into a vein. It is given by a health care professional in a hospital or clinic setting. Talk to your pediatrician regarding the use of this medicine in children. Special care may be needed. Overdosage: If you think you have taken too much of this medicine contact a poison control center or emergency room at once. NOTE: This medicine is only for you. Do not share this medicine with others. What if I miss a dose? It is important not to miss your dose. Call your doctor or health care professional if you are unable to keep an appointment. What may interact with this medicine? -certain antibiotics given by injection -NSAIDs, medicines for pain and inflammation, like ibuprofen or naproxen -some diuretics like bumetanide, furosemide -teriparatide -thalidomide This list may not describe all possible interactions. Give your health care provider a list of all the medicines, herbs, non-prescription drugs,  or dietary supplements you use. Also tell them if you smoke, drink alcohol, or use illegal drugs. Some items may interact with your medicine. What should I watch for while using this medicine? Visit your doctor or health care professional for regular checkups. It may be some time before you see the benefit from this medicine. Do not stop taking your medicine unless your doctor tells you to. Your doctor may order blood tests or other tests to see how you are doing. Women should inform their doctor if they wish to become pregnant or think they might be pregnant. There is a potential for serious side effects to an unborn child. Talk to your health care professional or pharmacist for more information. You should make sure that you get enough calcium and vitamin D while you are taking this medicine. Discuss the foods you eat and the vitamins you take with your health care professional. Some people who take this medicine have severe bone, joint, and/or muscle pain. This medicine may also increase your risk for jaw problems or a broken thigh bone. Tell your doctor right away if you have severe pain in your jaw, bones, joints, or muscles. Tell your doctor if you have any pain that does not go away or that gets worse. Tell your dentist and dental surgeon that you are taking this medicine. You should not have major dental surgery while on this medicine. See your dentist to have a dental exam and fix any dental problems before starting this medicine. Take good care of your teeth while on this medicine. Make sure you see your dentist for regular follow-up appointments. What side effects may I notice from receiving this medicine? Side effects   that you should report to your doctor or health care professional as soon as possible: -allergic reactions like skin rash, itching or hives, swelling of the face, lips, or tongue -anxiety, confusion, or depression -breathing problems -changes in vision -eye pain -feeling faint  or lightheaded, falls -jaw pain, especially after dental work -mouth sores -muscle cramps, stiffness, or weakness -redness, blistering, peeling or loosening of the skin, including inside the mouth -trouble passing urine or change in the amount of urine Side effects that usually do not require medical attention (report to your doctor or health care professional if they continue or are bothersome): -bone, joint, or muscle pain -constipation -diarrhea -fever -hair loss -irritation at site where injected -loss of appetite -nausea, vomiting -stomach upset -trouble sleeping -trouble swallowing -weak or tired This list may not describe all possible side effects. Call your doctor for medical advice about side effects. You may report side effects to FDA at 1-800-FDA-1088. Where should I keep my medicine? This drug is given in a hospital or clinic and will not be stored at home. NOTE: This sheet is a summary. It may not cover all possible information. If you have questions about this medicine, talk to your doctor, pharmacist, or health care provider.  2018 Elsevier/Gold Standard (2014-03-03 14:19:39)  Potassium Salts tablets, extended-release tablets or capsules What is this medicine? POTASSIUM (poe TASS i um) is a natural salt that is important for the heart, muscles, and nerves. It is found in many foods and is normally supplied by a well balanced diet. This medicine is used to treat low potassium. This medicine may be used for other purposes; ask your health care provider or pharmacist if you have questions. COMMON BRAND NAME(S): ED-K+10, Glu-K, K-10, K-8, K-Dur, K-Tab, Kaon-CL, Klor-Con, Klor-Con M10, Klor-Con M15, Klor-Con M20, Klotrix, Micro-K, Micro-K Extencaps, Slow-K What should I tell my health care provider before I take this medicine? They need to know if you have any of these conditions: -Addison's disease -dehydration -diabetes -difficulty swallowing -heart disease -history  of high levels of potassium in the blood -irregular heartbeat -kidney disease -recent severe burn -stomach ulcers or other stomach problems -an unusual or allergic reaction to potassium, tartrazine, other medicines, foods, dyes, or preservatives -pregnant or trying to get pregnant -breast-feeding How should I use this medicine? Take this medicine by mouth with a full glass of water. Take with food. Follow the directions on the prescription label. Do not suck on, crush, or chew this medicine. If you have difficulty swallowing, ask the pharmacist how to take. Take your medicine at regular intervals. Do not take it more often than directed. Do not stop taking except on your doctor's advice. Talk to your pediatrician regarding the use of this medicine in children. Special care may be needed. Overdosage: If you think you have taken too much of this medicine contact a poison control center or emergency room at once. NOTE: This medicine is only for you. Do not share this medicine with others. What if I miss a dose? If you miss a dose, take it as soon as you can. If it is almost time for your next dose, take only that dose. Do not take double or extra doses. What may interact with this medicine? Do not take this medicine with any of the following medications: -eplerenone -certain medicines for stomach problems like atropine; difenoxin and glycopyrrolate -sodium polystyrene sulfonate This medicine may also interact with the following medications: -certain medicines for blood pressure or heart disease like lisinopril, losartan,  quinapril, valsartan -medicines for cold or allergies -NSAIDs, medicines for pain and inflammation, like ibuprofen or napoxen -other potassium supplements -salt substitutes -some diuretics This list may not describe all possible interactions. Give your health care provider a list of all the medicines, herbs, non-prescription drugs, or dietary supplements you use. Also tell  them if you smoke, drink alcohol, or use illegal drugs. Some items may interact with your medicine. What should I watch for while using this medicine? Visit your doctor or health care professional for regular check ups. You will need lab work done regularly. You may need to be on a special diet while taking this medicine. Ask your doctor. What side effects may I notice from receiving this medicine? Side effects that you should report to your doctor or health care professional as soon as possible: -allergic reactions like skin rash, itching or hives, swelling of the face, lips, or tongue -anxious -black, tarry stools -breathing problems -confusion -heartburn -irregular heartbeat -numbness or tingling in hands or feet -pain when swallowing -unusually weak or tired -weakness, heaviness of legs Side effects that usually do not require medical attention (report to your doctor or health care professional if they continue or are bothersome): -diarrhea -nausea -upset stomach -vomiting This list may not describe all possible side effects. Call your doctor for medical advice about side effects. You may report side effects to FDA at 1-800-FDA-1088. Where should I keep my medicine? Keep out of the reach of children. Store at room temperature between 15 and 30 degrees C (59 and 86 degrees F ). Keep bottle closed tightly to protect this medicine from light and moisture. Throw away any unused medicine after the expiration date. NOTE: This sheet is a summary. It may not cover all possible information. If you have questions about this medicine, talk to your doctor, pharmacist, or health care provider.  2018 Elsevier/Gold Standard (2015-03-14 08:55:21)

## 2018-01-13 LAB — KAPPA/LAMBDA LIGHT CHAINS
KAPPA FREE LGHT CHN: 20.7 mg/L — AB (ref 3.3–19.4)
Kappa, lambda light chain ratio: 1.52 (ref 0.26–1.65)
Lambda free light chains: 13.6 mg/L (ref 5.7–26.3)

## 2018-01-13 LAB — IGG, IGA, IGM
IGA: 190 mg/dL (ref 64–422)
IgG (Immunoglobin G), Serum: 675 mg/dL — ABNORMAL LOW (ref 700–1600)
IgM (Immunoglobulin M), Srm: 106 mg/dL (ref 26–217)

## 2018-01-13 LAB — CANCER ANTIGEN 27.29: CA 27.29: 21.1 U/mL (ref 0.0–38.6)

## 2018-01-14 LAB — PROTEIN ELECTROPHORESIS, SERUM, WITH REFLEX
A/G Ratio: 1.3 (ref 0.7–1.7)
ALBUMIN ELP: 3.4 g/dL (ref 2.9–4.4)
Alpha-1-Globulin: 0.2 g/dL (ref 0.0–0.4)
Alpha-2-Globulin: 0.9 g/dL (ref 0.4–1.0)
BETA GLOBULIN: 0.8 g/dL (ref 0.7–1.3)
GAMMA GLOBULIN: 0.7 g/dL (ref 0.4–1.8)
Globulin, Total: 2.6 g/dL (ref 2.2–3.9)
TOTAL PROTEIN ELP: 6 g/dL (ref 6.0–8.5)

## 2018-01-17 ENCOUNTER — Telehealth: Payer: Self-pay | Admitting: *Deleted

## 2018-01-17 NOTE — Telephone Encounter (Addendum)
Patient's son aware of results.   ----- Message from [name], MD sent at 01/14/2018  4:53 PM EDT ----- Call her son and tell him that there is still no myeloma in her blood.  Thank you

## 2018-04-13 ENCOUNTER — Inpatient Hospital Stay: Payer: Medicare Other | Admitting: Hematology & Oncology

## 2018-04-13 ENCOUNTER — Inpatient Hospital Stay: Payer: Medicare Other

## 2018-05-11 ENCOUNTER — Inpatient Hospital Stay (HOSPITAL_BASED_OUTPATIENT_CLINIC_OR_DEPARTMENT_OTHER): Payer: Medicare Other | Admitting: Hematology & Oncology

## 2018-05-11 ENCOUNTER — Inpatient Hospital Stay: Payer: Medicare Other | Attending: Hematology & Oncology

## 2018-05-11 ENCOUNTER — Inpatient Hospital Stay: Payer: Medicare Other

## 2018-05-11 ENCOUNTER — Other Ambulatory Visit: Payer: Self-pay

## 2018-05-11 VITALS — BP 150/74 | HR 91 | Temp 98.2°F | Wt 152.0 lb

## 2018-05-11 DIAGNOSIS — C9000 Multiple myeloma not having achieved remission: Secondary | ICD-10-CM | POA: Insufficient documentation

## 2018-05-11 DIAGNOSIS — C9001 Multiple myeloma in remission: Secondary | ICD-10-CM

## 2018-05-11 DIAGNOSIS — C773 Secondary and unspecified malignant neoplasm of axilla and upper limb lymph nodes: Secondary | ICD-10-CM

## 2018-05-11 DIAGNOSIS — M7989 Other specified soft tissue disorders: Secondary | ICD-10-CM

## 2018-05-11 DIAGNOSIS — C50911 Malignant neoplasm of unspecified site of right female breast: Secondary | ICD-10-CM

## 2018-05-11 DIAGNOSIS — Z171 Estrogen receptor negative status [ER-]: Secondary | ICD-10-CM

## 2018-05-11 DIAGNOSIS — I427 Cardiomyopathy due to drug and external agent: Secondary | ICD-10-CM

## 2018-05-11 DIAGNOSIS — R5383 Other fatigue: Secondary | ICD-10-CM

## 2018-05-11 DIAGNOSIS — D631 Anemia in chronic kidney disease: Secondary | ICD-10-CM

## 2018-05-11 DIAGNOSIS — S22000A Wedge compression fracture of unspecified thoracic vertebra, initial encounter for closed fracture: Secondary | ICD-10-CM

## 2018-05-11 DIAGNOSIS — N183 Chronic kidney disease, stage 3 unspecified: Secondary | ICD-10-CM

## 2018-05-11 DIAGNOSIS — Z95828 Presence of other vascular implants and grafts: Secondary | ICD-10-CM

## 2018-05-11 DIAGNOSIS — T50905A Adverse effect of unspecified drugs, medicaments and biological substances, initial encounter: Secondary | ICD-10-CM

## 2018-05-11 DIAGNOSIS — D630 Anemia in neoplastic disease: Secondary | ICD-10-CM

## 2018-05-11 DIAGNOSIS — E876 Hypokalemia: Secondary | ICD-10-CM

## 2018-05-11 LAB — CBC WITH DIFFERENTIAL (CANCER CENTER ONLY)
BASOS PCT: 1 %
Basophils Absolute: 0.1 10*3/uL (ref 0.0–0.1)
EOS ABS: 0.1 10*3/uL (ref 0.0–0.5)
Eosinophils Relative: 1 %
HCT: 41.3 % (ref 34.8–46.6)
Hemoglobin: 14.3 g/dL (ref 11.6–15.9)
LYMPHS ABS: 0.9 10*3/uL (ref 0.9–3.3)
Lymphocytes Relative: 12 %
MCH: 34.2 pg — AB (ref 26.0–34.0)
MCHC: 34.6 g/dL (ref 32.0–36.0)
MCV: 98.8 fL (ref 81.0–101.0)
MONOS PCT: 9 %
Monocytes Absolute: 0.7 10*3/uL (ref 0.1–0.9)
NEUTROS PCT: 77 %
Neutro Abs: 5.8 10*3/uL (ref 1.5–6.5)
Platelet Count: 203 10*3/uL (ref 145–400)
RBC: 4.18 MIL/uL (ref 3.70–5.32)
RDW: 13.6 % (ref 11.1–15.7)
WBC Count: 7.5 10*3/uL (ref 3.9–10.0)

## 2018-05-11 LAB — CMP (CANCER CENTER ONLY)
ALBUMIN: 3.6 g/dL (ref 3.5–5.0)
ALT: 14 U/L (ref 10–47)
AST: 21 U/L (ref 11–38)
Alkaline Phosphatase: 65 U/L (ref 26–84)
Anion gap: 7 (ref 5–15)
BUN: 17 mg/dL (ref 7–22)
CO2: 25 mmol/L (ref 18–33)
CREATININE: 1.4 mg/dL — AB (ref 0.60–1.20)
Calcium: 9.4 mg/dL (ref 8.0–10.3)
Chloride: 107 mmol/L (ref 98–108)
Glucose, Bld: 113 mg/dL (ref 73–118)
Potassium: 4.5 mmol/L (ref 3.3–4.7)
SODIUM: 139 mmol/L (ref 128–145)
Total Bilirubin: 0.8 mg/dL (ref 0.2–1.6)
Total Protein: 6.6 g/dL (ref 6.4–8.1)

## 2018-05-11 MED ORDER — HEPARIN SOD (PORK) LOCK FLUSH 100 UNIT/ML IV SOLN
500.0000 [IU] | Freq: Once | INTRAVENOUS | Status: AC
  Administered 2018-05-11: 500 [IU] via INTRAVENOUS
  Filled 2018-05-11: qty 5

## 2018-05-11 MED ORDER — SODIUM CHLORIDE 0.9% FLUSH
10.0000 mL | INTRAVENOUS | Status: AC | PRN
  Administered 2018-05-11: 10 mL via INTRAVENOUS
  Filled 2018-05-11: qty 10

## 2018-05-11 MED ORDER — ZOLEDRONIC ACID 4 MG/5ML IV CONC
3.0000 mg | Freq: Once | INTRAVENOUS | Status: AC
  Administered 2018-05-11: 3 mg via INTRAVENOUS
  Filled 2018-05-11: qty 3.75

## 2018-05-11 MED ORDER — SODIUM CHLORIDE 0.9 % IV SOLN
Freq: Once | INTRAVENOUS | Status: AC
  Administered 2018-05-11: 13:00:00 via INTRAVENOUS
  Filled 2018-05-11: qty 250

## 2018-05-11 NOTE — Patient Instructions (Signed)
Implanted Port Insertion, Care After °This sheet gives you information about how to care for yourself after your procedure. Your health care provider may also give you more specific instructions. If you have problems or questions, contact your health care provider. °What can I expect after the procedure? °After your procedure, it is common to have: °· Discomfort at the port insertion site. °· Bruising on the skin over the port. This should improve over 3-4 days. ° °Follow these instructions at home: °Port care °· After your port is placed, you will get a manufacturer's information card. The card has information about your port. Keep this card with you at all times. °· Take care of the port as told by your health care provider. Ask your health care provider if you or a family member can get training for taking care of the port at home. A home health care nurse may also take care of the port. °· Make sure to remember what type of port you have. °Incision care °· Follow instructions from your health care provider about how to take care of your port insertion site. Make sure you: °? Wash your hands with soap and water before you change your bandage (dressing). If soap and water are not available, use hand sanitizer. °? Change your dressing as told by your health care provider. °? Leave stitches (sutures), skin glue, or adhesive strips in place. These skin closures may need to stay in place for 2 weeks or longer. If adhesive strip edges start to loosen and curl up, you may trim the loose edges. Do not remove adhesive strips completely unless your health care provider tells you to do that. °· Check your port insertion site every day for signs of infection. Check for: °? More redness, swelling, or pain. °? More fluid or blood. °? Warmth. °? Pus or a bad smell. °General instructions °· Do not take baths, swim, or use a hot tub until your health care provider approves. °· Do not lift anything that is heavier than 10 lb (4.5  kg) for a week, or as told by your health care provider. °· Ask your health care provider when it is okay to: °? Return to work or school. °? Resume usual physical activities or sports. °· Do not drive for 24 hours if you were given a medicine to help you relax (sedative). °· Take over-the-counter and prescription medicines only as told by your health care provider. °· Wear a medical alert bracelet in case of an emergency. This will tell any health care providers that you have a port. °· Keep all follow-up visits as told by your health care provider. This is important. °Contact a health care provider if: °· You cannot flush your port with saline as directed, or you cannot draw blood from the port. °· You have a fever or chills. °· You have more redness, swelling, or pain around your port insertion site. °· You have more fluid or blood coming from your port insertion site. °· Your port insertion site feels warm to the touch. °· You have pus or a bad smell coming from the port insertion site. °Get help right away if: °· You have chest pain or shortness of breath. °· You have bleeding from your port that you cannot control. °Summary °· Take care of the port as told by your health care provider. °· Change your dressing as told by your health care provider. °· Keep all follow-up visits as told by your health care provider. °  This information is not intended to replace advice given to you by your health care provider. Make sure you discuss any questions you have with your health care provider. °Document Released: 07/26/2013 Document Revised: 08/26/2016 Document Reviewed: 08/26/2016 °Elsevier Interactive Patient Education © 2017 Elsevier Inc. ° °

## 2018-05-11 NOTE — Patient Instructions (Signed)

## 2018-05-11 NOTE — Progress Notes (Signed)
Hematology and Oncology Follow Up Visit  Jacqueline Rivas [id number] [birth year] 84 y.o. 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 is back for followup.  As always, she is doing quite well.  Despite doing what she wants to do and taking whatever medicine she likes, she is managing.  My only real concern is that she just is not exercising.  Her son always comes up with her.  He keeps trying to get her to exercise and yet she just will not.  She said that she has some fatigue.  She has some leg swelling.  She does not see a cardiologist.  She probably needs to see one because of history of heart failure.  Again, she pretty much does what she feels is best for herself.  She has had no fever.  She has had no cough.  She is had no nausea or vomiting.  We last saw her in March, her M spike was not detectable.  Her IgA level was 190 mg/dL.  Her kappa light chain was 2.1 mg/dL.  Her CA 27.29 in March was stable at 21.1.  Overall, her performance status is ECOG 2.  Medications:  Current Outpatient Medications:  .  aspirin EC 81 MG tablet, Take 162 mg by mouth daily., Disp: , Rfl:  .  calcium carbonate (OS-CAL) 600 MG TABS tablet, Take 1,200 mg by mouth daily with breakfast., Disp: , Rfl:  .  Cholecalciferol (VITAMIN D3) 2000 units TABS, , Disp: , Rfl:  .  Coenzyme Q10 (CO Q 10) 100 MG CAPS, Take 100 mg by mouth every morning. , Disp: , Rfl:  .  lenalidomide (REVLIMID) 15 MG capsule, Take 1 capsule (15 mg total) by mouth daily. Take for 21 days. Off for 7 days.  [id number], Disp: 21 capsule, Rfl: 0 .  magnesium 30 MG tablet, Take 30 mg by mouth every morning., Disp: , Rfl:  .  Potassium 99 MG TABS, , Disp: , Rfl:  .  potassium chloride SA (K-DUR,KLOR-CON) 20 MEQ tablet, Take 1 tablet (20 mEq total) by mouth daily., Disp: 90 tablet, Rfl: 3 .  zolendronic acid (ZOMETA) 4 MG/5ML injection, Inject into the vein., Disp: , Rfl:  No current facility-administered medications for this visit.   Facility-Administered Medications Ordered in Other Visits:  .  sodium chloride 0.9 % injection 10 mL, 10 mL, Intracatheter, PRN, [name], MD, 10 mL at 04/16/14 1332  Allergies: No Known Allergies  Past Medical History, Surgical history, Social history, and Family History were reviewed and updated.  Review of Systems: Review of Systems  Constitutional: Negative.   HENT: Negative.   Eyes: Negative.   Respiratory: Negative.   Cardiovascular: Positive for leg swelling.  Gastrointestinal: Negative.   Genitourinary: Negative.   Musculoskeletal: Positive for joint pain and myalgias.  Skin: Negative.   Endo/Heme/Allergies: Negative.   Psychiatric/Behavioral: Negative.      Physical Exam:  weight is 152 lb (68.9  kg). Her oral temperature is 98.2 F (36.8 C). Her blood pressure is 150/74 (abnormal) and her pulse is 91. Her oxygen saturation is 97%.   Physical Exam  Constitutional: She is oriented to person, place, and time.  HENT:  Head: Normocephalic and atraumatic.  Mouth/Throat: Oropharynx is clear and moist.  Eyes: Pupils are equal, round, and reactive to light. EOM are normal.  Neck: Normal range of motion.  Cardiovascular: Normal rate, regular rhythm and normal heart sounds.  Pulmonary/Chest: Effort normal and breath sounds normal.  Abdominal: Soft. Bowel sounds are normal.  Musculoskeletal: Normal range of motion. She exhibits no edema, tenderness or deformity.  Lymphadenopathy:    She has no cervical adenopathy.  Neurological: She is  alert and oriented to person, place, and time.  Skin: Skin is warm and dry. No rash noted. No erythema.  Psychiatric: She has a normal mood and affect. Her behavior is normal. Judgment and thought content normal.  Vitals reviewed.    Lab Results  Component Value Date   WBC 7.5 05/11/2018   HGB 14.3 05/11/2018   HCT 41.3 05/11/2018   MCV 98.8 05/11/2018   PLT 203 05/11/2018     Chemistry      Component Value Date/Time   NA 143 01/12/2018 1320   NA 143 09/23/2017 1019   K 4.0 01/12/2018 1320   K 4.0 09/23/2017 1019   CL 106 01/12/2018 1320   CL 104 09/23/2017 1019   CO2 27 01/12/2018 1320   CO2 26 09/23/2017 1019   BUN 16 01/12/2018 1320   BUN 16 09/23/2017 1019   CREATININE 1.50 (H) 01/12/2018 1320   CREATININE 1.4 (H) 09/23/2017 1019      Component Value Date/Time   CALCIUM 9.2 01/12/2018 1320   CALCIUM 9.7 09/23/2017 1019   CALCIUM 11.1 (H) 11/13/2012 1526   ALKPHOS 69 01/12/2018 1320   ALKPHOS 61 09/23/2017 1019   AST 23 01/12/2018 1320   ALT 16 01/12/2018 1320   ALT 15 09/23/2017 1019   BILITOT 0.9 01/12/2018 1320         Impression and Plan: Jacqueline Rivas is 84 year old white female. She has both IgA kappa myeloma and stage II breast cancer.   I would be surprised if she had a relapse of the myeloma.  We will see what her myeloma studies look like.    She gets her Zometa today.    As far as her breast cancer is concerned, this really has not been an issue so far.  Her tumor marker has always been stable.  Marland Kitchen [name], MD 7/24/201911:56 AM

## 2018-05-12 LAB — IGG, IGA, IGM
IGG (IMMUNOGLOBIN G), SERUM: 707 mg/dL (ref 700–1600)
IgA: 203 mg/dL (ref 64–422)
IgM (Immunoglobulin M), Srm: 120 mg/dL (ref 26–217)

## 2018-05-12 LAB — KAPPA/LAMBDA LIGHT CHAINS
KAPPA FREE LGHT CHN: 20.8 mg/L — AB (ref 3.3–19.4)
Kappa, lambda light chain ratio: 1.43 (ref 0.26–1.65)
Lambda free light chains: 14.5 mg/L (ref 5.7–26.3)

## 2018-05-12 LAB — PROTEIN ELECTROPHORESIS, SERUM, WITH REFLEX
A/G Ratio: 1.3 (ref 0.7–1.7)
ALBUMIN ELP: 3.4 g/dL (ref 2.9–4.4)
ALPHA-1-GLOBULIN: 0.2 g/dL (ref 0.0–0.4)
ALPHA-2-GLOBULIN: 0.8 g/dL (ref 0.4–1.0)
Beta Globulin: 0.9 g/dL (ref 0.7–1.3)
Gamma Globulin: 0.7 g/dL (ref 0.4–1.8)
Globulin, Total: 2.7 g/dL (ref 2.2–3.9)
Total Protein ELP: 6.1 g/dL (ref 6.0–8.5)

## 2018-05-12 LAB — CANCER ANTIGEN 27.29: CAN 27.29: 20.9 U/mL (ref 0.0–38.6)

## 2018-05-13 ENCOUNTER — Telehealth: Payer: Self-pay | Admitting: *Deleted

## 2018-05-13 NOTE — Telephone Encounter (Addendum)
Patient is aware of results.   ----- Message from [name], MD sent at 05/12/2018  5:00 PM EDT ----- Call - still no active myeloma!!  Laurey Arrow

## 2018-09-20 ENCOUNTER — Other Ambulatory Visit: Payer: PRIVATE HEALTH INSURANCE

## 2018-09-20 ENCOUNTER — Ambulatory Visit: Payer: PRIVATE HEALTH INSURANCE | Admitting: Hematology & Oncology

## 2018-09-21 ENCOUNTER — Inpatient Hospital Stay: Payer: Medicare Other

## 2018-09-21 ENCOUNTER — Encounter: Payer: Self-pay | Admitting: Hematology & Oncology

## 2018-09-21 ENCOUNTER — Inpatient Hospital Stay (HOSPITAL_BASED_OUTPATIENT_CLINIC_OR_DEPARTMENT_OTHER): Payer: Medicare Other | Admitting: Hematology & Oncology

## 2018-09-21 ENCOUNTER — Inpatient Hospital Stay: Payer: Medicare Other | Attending: Hematology & Oncology

## 2018-09-21 VITALS — BP 153/75 | HR 85 | Temp 98.1°F | Resp 18 | Wt 152.7 lb

## 2018-09-21 DIAGNOSIS — C50911 Malignant neoplasm of unspecified site of right female breast: Secondary | ICD-10-CM

## 2018-09-21 DIAGNOSIS — C9001 Multiple myeloma in remission: Secondary | ICD-10-CM

## 2018-09-21 DIAGNOSIS — I429 Cardiomyopathy, unspecified: Secondary | ICD-10-CM | POA: Diagnosis not present

## 2018-09-21 DIAGNOSIS — Z853 Personal history of malignant neoplasm of breast: Secondary | ICD-10-CM | POA: Diagnosis not present

## 2018-09-21 DIAGNOSIS — S22000A Wedge compression fracture of unspecified thoracic vertebra, initial encounter for closed fracture: Secondary | ICD-10-CM

## 2018-09-21 DIAGNOSIS — C773 Secondary and unspecified malignant neoplasm of axilla and upper limb lymph nodes: Secondary | ICD-10-CM

## 2018-09-21 DIAGNOSIS — D630 Anemia in neoplastic disease: Secondary | ICD-10-CM

## 2018-09-21 DIAGNOSIS — C9000 Multiple myeloma not having achieved remission: Secondary | ICD-10-CM

## 2018-09-21 LAB — CBC WITH DIFFERENTIAL (CANCER CENTER ONLY)
Abs Immature Granulocytes: 0.02 10*3/uL (ref 0.00–0.07)
BASOS PCT: 1 %
Basophils Absolute: 0.1 10*3/uL (ref 0.0–0.1)
EOS PCT: 0 %
Eosinophils Absolute: 0 10*3/uL (ref 0.0–0.5)
HCT: 45.7 % (ref 36.0–46.0)
HEMOGLOBIN: 14.3 g/dL (ref 12.0–15.0)
Immature Granulocytes: 0 %
LYMPHS PCT: 12 %
Lymphs Abs: 0.8 10*3/uL (ref 0.7–4.0)
MCH: 33 pg (ref 26.0–34.0)
MCHC: 31.3 g/dL (ref 30.0–36.0)
MCV: 105.5 fL — AB (ref 80.0–100.0)
MONO ABS: 0.6 10*3/uL (ref 0.1–1.0)
MONOS PCT: 9 %
Neutro Abs: 5.1 10*3/uL (ref 1.7–7.7)
Neutrophils Relative %: 78 %
RBC: 4.33 MIL/uL (ref 3.87–5.11)
RDW: 13.7 % (ref 11.5–15.5)
WBC: 6.6 10*3/uL (ref 4.0–10.5)
nRBC: 0 % (ref 0.0–0.2)

## 2018-09-21 LAB — CMP (CANCER CENTER ONLY)
ALK PHOS: 61 U/L (ref 38–126)
ALT: 10 U/L (ref 0–44)
ANION GAP: 10 (ref 5–15)
AST: 16 U/L (ref 15–41)
Albumin: 4 g/dL (ref 3.5–5.0)
BUN: 14 mg/dL (ref 8–23)
CALCIUM: 9.8 mg/dL (ref 8.9–10.3)
CO2: 24 mmol/L (ref 22–32)
CREATININE: 1.19 mg/dL — AB (ref 0.44–1.00)
Chloride: 105 mmol/L (ref 98–111)
GFR, EST AFRICAN AMERICAN: 49 mL/min — AB (ref >60)
GFR, EST NON AFRICAN AMERICAN: 42 mL/min — AB (ref >60)
Glucose, Bld: 94 mg/dL (ref 70–99)
Potassium: 4.5 mmol/L (ref 3.5–5.1)
SODIUM: 139 mmol/L (ref 135–145)
Total Bilirubin: 0.7 mg/dL (ref 0.3–1.2)
Total Protein: 6.5 g/dL (ref 6.5–8.1)

## 2018-09-21 LAB — LACTATE DEHYDROGENASE: LDH: 143 U/L (ref 98–192)

## 2018-09-21 MED ORDER — SODIUM CHLORIDE 0.9% FLUSH
10.0000 mL | INTRAVENOUS | Status: DC | PRN
  Administered 2018-09-21: 10 mL via INTRAVENOUS
  Filled 2018-09-21: qty 10

## 2018-09-21 MED ORDER — ZOLEDRONIC ACID 4 MG/5ML IV CONC
3.0000 mg | Freq: Once | INTRAVENOUS | Status: AC
  Administered 2018-09-21: 3 mg via INTRAVENOUS
  Filled 2018-09-21: qty 3.75

## 2018-09-21 MED ORDER — SODIUM CHLORIDE 0.9 % IV SOLN
Freq: Once | INTRAVENOUS | Status: AC
  Administered 2018-09-21: 12:00:00 via INTRAVENOUS
  Filled 2018-09-21: qty 250

## 2018-09-21 MED ORDER — HEPARIN SOD (PORK) LOCK FLUSH 100 UNIT/ML IV SOLN
500.0000 [IU] | Freq: Once | INTRAVENOUS | Status: AC
  Administered 2018-09-21: 500 [IU] via INTRAVENOUS
  Filled 2018-09-21: qty 5

## 2018-09-21 NOTE — Progress Notes (Signed)
Hematology and Oncology Follow Up Visit  Jacqueline Rivas [id number] Sep 15, 1932 84 y.o. 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  For right now, she is doing okay.  We last saw Jacqueline back in July.  She always seems to manage to do what she would like to do.  She recently had Jacqueline birthday.  This was Jacqueline 86th birthday.  She still is not exercising.  As much as I tell Jacqueline to exercise, she just will not do this.  Thankfully, Jacqueline myeloma studies more last saw Jacqueline did not show a monoclonal spike.  Jacqueline breast cancer is also in remission.  Jacqueline last CA 27.29 that we did in July was 21.  Jacqueline IgG level we last saw Jacqueline was 203 mg/dL.  Jacqueline kappa light chain was 2.1 mg/dL.    Overall, Jacqueline performance status is ECOG 2.  Medications:  Current Outpatient Medications:  .  aspirin EC 81 MG tablet, Take 162 mg by mouth daily., Disp: , Rfl:  .  calcium carbonate (OS-CAL) 600 MG TABS tablet, Take 1,200 mg by mouth daily with breakfast., Disp: , Rfl:  .  Cholecalciferol (VITAMIN D3) 2000 units TABS, , Disp: , Rfl:  .  Coenzyme Q10 (CO Q 10) 100 MG CAPS, Take 100 mg by mouth every morning. , Disp: , Rfl:  .  lenalidomide (REVLIMID) 15 MG capsule, Take 1 capsule (15 mg total) by mouth daily. Take for 21 days. Off for 7 days. VHQI#[id number], Disp: 21 capsule, Rfl: 0 .  magnesium 30 MG tablet, Take 30 mg by mouth every morning., Disp: , Rfl:  .  Potassium 99 MG TABS, , Disp: , Rfl:  .  potassium chloride SA (K-DUR,KLOR-CON) 20 MEQ tablet, Take 1 tablet  (20 mEq total) by mouth daily., Disp: 90 tablet, Rfl: 3 .  zolendronic acid (ZOMETA) 4 MG/5ML injection, Inject into the vein., Disp: , Rfl:  No current facility-administered medications for this visit.   Facility-Administered Medications Ordered in Other Visits:  .  sodium chloride 0.9 % injection 10 mL, 10 mL, Intracatheter, PRN, [name], MD, 10 mL at 04/16/14 1332 .  sodium chloride flush (NS) 0.9 % injection 10 mL, 10 mL, Intravenous, PRN, [place], Sarah M, NP, 10 mL at 05/11/18 1331 .  sodium chloride flush (NS) 0.9 % injection 10 mL, 10 mL, Intravenous, PRN, [place], Sarah M, NP, 10 mL at 09/21/18 8413  Allergies: No Known Allergies  Past Medical History, Surgical history, Social history, and Family History were reviewed and updated.  Review of Systems: Review of Systems  Constitutional: Negative.   HENT: Negative.   Eyes: Negative.   Respiratory: Negative.   Cardiovascular: Positive for leg swelling.  Gastrointestinal: Negative.   Genitourinary: Negative.   Musculoskeletal: Positive for joint pain and myalgias.  Skin: Negative.   Endo/Heme/Allergies: Negative.   Psychiatric/Behavioral: Negative.      Physical Exam:  weight is 152  lb 11 oz (69.3 kg). Jacqueline oral temperature is 98.1 F (36.7 C). Jacqueline blood pressure is 153/75 (abnormal) and Jacqueline pulse is 85. Jacqueline respiration is 18 and oxygen saturation is 98%.   Physical Exam  Constitutional: She is oriented to person, place, and time.  HENT:  Head: Normocephalic and atraumatic.  Mouth/Throat: Oropharynx is clear and moist.  Eyes: Pupils are equal, round, and reactive to light. EOM are normal.  Neck: Normal range of motion.  Cardiovascular: Normal rate, regular rhythm and normal heart sounds.  Pulmonary/Chest: Effort normal and breath sounds normal.  Abdominal: Soft. Bowel sounds are normal.  Musculoskeletal: Normal range of motion. She exhibits no edema, tenderness or deformity.  Lymphadenopathy:    She has  no cervical adenopathy.  Neurological: She is alert and oriented to person, place, and time.  Skin: Skin is warm and dry. No rash noted. No erythema.  Psychiatric: She has a normal mood and affect. [place]queline behavior is normal. Judgment and thought content normal.  Vitals reviewed.    Lab Results  Component Value Date   WBC 6.6 09/21/2018   HGB 14.3 09/21/2018   HCT 45.7 09/21/2018   MCV 105.5 (H) 09/21/2018   PLT PLT CLUMPING NOTED, COLLECT IN CITRATE FOR CBC 09/21/2018     Chemistry      Component Value Date/Time   NA 139 09/21/2018 1000   NA 143 09/23/2017 1019   K 4.5 09/21/2018 1000   K 4.0 09/23/2017 1019   CL 105 09/21/2018 1000   CL 104 09/23/2017 1019   CO2 24 09/21/2018 1000   CO2 26 09/23/2017 1019   BUN 14 09/21/2018 1000   BUN 16 09/23/2017 1019   CREATININE 1.19 (H) 09/21/2018 1000   CREATININE 1.4 (H) 09/23/2017 1019      Component Value Date/Time   CALCIUM 9.8 09/21/2018 1000   CALCIUM 9.7 09/23/2017 1019   CALCIUM 11.1 (H) 11/13/2012 1526   ALKPHOS 61 09/21/2018 1000   ALKPHOS 61 09/23/2017 1019   AST 16 09/21/2018 1000   ALT 10 09/21/2018 1000   ALT 15 09/23/2017 1019   BILITOT 0.7 09/21/2018 1000         Impression and Plan: Jacqueline Rivas is 84 year old white female. She has both IgA kappa myeloma and stage II breast cancer.   I am still and amazement that Jacqueline Rivas.  I would think that at some point it will recur.  I do not know when this will be.  We will go ahead with Jacqueline Zometa today.  I will make sure she gets some extra IV fluid.  We will plan to see Jacqueline back in another 4 months.  Maybe, with a new decade upon [place], she will begin to exercise.    Marland Kitchen [name], MD 12/4/201911:14 AM

## 2018-09-21 NOTE — Patient Instructions (Signed)
Implanted Port Home Guide An implanted port is a type of central line that is placed under the skin. Central lines are used to provide IV access when treatment or nutrition needs to be given through a person's veins. Implanted ports are used for long-term IV access. An implanted port may be placed because:  You need IV medicine that would be irritating to the small veins in your hands or arms.  You need long-term IV medicines, such as antibiotics.  You need IV nutrition for a long period.  You need frequent blood draws for lab tests.  You need dialysis.  Implanted ports are usually placed in the chest area, but they can also be placed in the upper arm, the abdomen, or the leg. An implanted port has two main parts:  Reservoir. The reservoir is round and will appear as a small, raised area under your skin. The reservoir is the part where a needle is inserted to give medicines or draw blood.  Catheter. The catheter is a thin, flexible tube that extends from the reservoir. The catheter is placed into a large vein. Medicine that is inserted into the reservoir goes into the catheter and then into the vein.  How will I care for my incision site? Do not get the incision site wet. Bathe or shower as directed by your health care provider. How is my port accessed? Special steps must be taken to access the port:  Before the port is accessed, a numbing cream can be placed on the skin. This helps numb the skin over the port site.  Your health care provider uses a sterile technique to access the port. ? Your health care provider must put on a mask and sterile gloves. ? The skin over your port is cleaned carefully with an antiseptic and allowed to dry. ? The port is gently pinched between sterile gloves, and a needle is inserted into the port.  Only "non-coring" port needles should be used to access the port. Once the port is accessed, a blood return should be checked. This helps ensure that the port  is in the vein and is not clogged.  If your port needs to remain accessed for a constant infusion, a clear (transparent) bandage will be placed over the needle site. The bandage and needle will need to be changed every week, or as directed by your health care provider.  Keep the bandage covering the needle clean and dry. Do not get it wet. Follow your health care provider's instructions on how to take a shower or bath while the port is accessed.  If your port does not need to stay accessed, no bandage is needed over the port.  What is flushing? Flushing helps keep the port from getting clogged. Follow your health care provider's instructions on how and when to flush the port. Ports are usually flushed with saline solution or a medicine called heparin. The need for flushing will depend on how the port is used.  If the port is used for intermittent medicines or blood draws, the port will need to be flushed: ? After medicines have been given. ? After blood has been drawn. ? As part of routine maintenance.  If a constant infusion is running, the port may not need to be flushed.  How long will my port stay implanted? The port can stay in for as long as your health care provider thinks it is needed. When it is time for the port to come out, surgery will be   done to remove it. The procedure is similar to the one performed when the port was put in. When should I seek immediate medical care? When you have an implanted port, you should seek immediate medical care if:  You notice a bad smell coming from the incision site.  You have swelling, redness, or drainage at the incision site.  You have more swelling or pain at the port site or the surrounding area.  You have a fever that is not controlled with medicine.  This information is not intended to replace advice given to you by your health care provider. Make sure you discuss any questions you have with your health care provider. Document  Released: 10/05/2005 Document Revised: 03/12/2016 Document Reviewed: 06/12/2013 Elsevier Interactive Patient Education  2017 Elsevier Inc.  

## 2018-09-21 NOTE — Patient Instructions (Signed)

## 2018-09-22 LAB — CANCER ANTIGEN 27.29: CAN 27.29: 17.2 U/mL (ref 0.0–38.6)

## 2018-09-22 LAB — IGG, IGA, IGM
IGA: 211 mg/dL (ref 64–422)
IGG (IMMUNOGLOBIN G), SERUM: 717 mg/dL (ref 700–1600)
IgM (Immunoglobulin M), Srm: 125 mg/dL (ref 26–217)

## 2018-09-22 LAB — KAPPA/LAMBDA LIGHT CHAINS
Kappa free light chain: 20.2 mg/L — ABNORMAL HIGH (ref 3.3–19.4)
Kappa, lambda light chain ratio: 1.36 (ref 0.26–1.65)
Lambda free light chains: 14.9 mg/L (ref 5.7–26.3)

## 2018-09-23 LAB — PROTEIN ELECTROPHORESIS, SERUM, WITH REFLEX
A/G Ratio: 1.3 (ref 0.7–1.7)
ALBUMIN ELP: 3.5 g/dL (ref 2.9–4.4)
Alpha-1-Globulin: 0.2 g/dL (ref 0.0–0.4)
Alpha-2-Globulin: 0.9 g/dL (ref 0.4–1.0)
Beta Globulin: 0.9 g/dL (ref 0.7–1.3)
Gamma Globulin: 0.7 g/dL (ref 0.4–1.8)
Globulin, Total: 2.7 g/dL (ref 2.2–3.9)
Total Protein ELP: 6.2 g/dL (ref 6.0–8.5)

## 2018-09-27 ENCOUNTER — Telehealth: Payer: Self-pay | Admitting: *Deleted

## 2018-09-27 NOTE — Telephone Encounter (Signed)
-----   Message from [name], MD sent at 09/23/2018  5:19 PM EST ----- Call - NO myeloma!!  Laurey Arrow

## 2018-09-27 NOTE — Telephone Encounter (Signed)
Called and left a message for patient to call this office for results. Per Dr. Marin Olp, she has NO myeloma.

## 2019-01-05 ENCOUNTER — Telehealth: Payer: Self-pay | Admitting: Hematology & Oncology

## 2019-01-05 NOTE — Telephone Encounter (Signed)
Received call to cancel April 1 appts per pt 3/19 nursing home is on lock down

## 2019-01-18 ENCOUNTER — Ambulatory Visit: Payer: PRIVATE HEALTH INSURANCE | Admitting: Hematology & Oncology

## 2019-01-18 ENCOUNTER — Other Ambulatory Visit: Payer: PRIVATE HEALTH INSURANCE

## 2019-01-18 ENCOUNTER — Ambulatory Visit: Payer: PRIVATE HEALTH INSURANCE

## 2019-04-14 DIAGNOSIS — Z1159 Encounter for screening for other viral diseases: Secondary | ICD-10-CM | POA: Diagnosis not present

## 2019-08-03 DIAGNOSIS — Z1159 Encounter for screening for other viral diseases: Secondary | ICD-10-CM | POA: Diagnosis not present

## 2019-09-13 ENCOUNTER — Telehealth: Payer: Self-pay

## 2019-09-13 DIAGNOSIS — L0889 Other specified local infections of the skin and subcutaneous tissue: Secondary | ICD-10-CM | POA: Diagnosis not present

## 2019-09-13 DIAGNOSIS — I10 Essential (primary) hypertension: Secondary | ICD-10-CM | POA: Diagnosis not present

## 2019-09-13 DIAGNOSIS — Z853 Personal history of malignant neoplasm of breast: Secondary | ICD-10-CM | POA: Diagnosis not present

## 2019-09-13 DIAGNOSIS — C9000 Multiple myeloma not having achieved remission: Secondary | ICD-10-CM | POA: Diagnosis not present

## 2019-09-20 DIAGNOSIS — Z79899 Other long term (current) drug therapy: Secondary | ICD-10-CM | POA: Diagnosis not present

## 2019-09-20 DIAGNOSIS — L0390 Cellulitis, unspecified: Secondary | ICD-10-CM | POA: Diagnosis not present

## 2019-09-20 DIAGNOSIS — C9000 Multiple myeloma not having achieved remission: Secondary | ICD-10-CM | POA: Diagnosis not present

## 2019-09-20 DIAGNOSIS — Z853 Personal history of malignant neoplasm of breast: Secondary | ICD-10-CM | POA: Diagnosis not present

## 2019-09-27 DIAGNOSIS — L0889 Other specified local infections of the skin and subcutaneous tissue: Secondary | ICD-10-CM | POA: Diagnosis not present

## 2019-09-27 DIAGNOSIS — R140 Abdominal distension (gaseous): Secondary | ICD-10-CM | POA: Diagnosis not present

## 2019-09-27 DIAGNOSIS — C9000 Multiple myeloma not having achieved remission: Secondary | ICD-10-CM | POA: Diagnosis not present

## 2019-09-27 DIAGNOSIS — Z853 Personal history of malignant neoplasm of breast: Secondary | ICD-10-CM | POA: Diagnosis not present

## 2019-09-27 DIAGNOSIS — I10 Essential (primary) hypertension: Secondary | ICD-10-CM | POA: Diagnosis not present

## 2019-11-11 ENCOUNTER — Other Ambulatory Visit: Payer: Self-pay | Admitting: Nurse Practitioner

## 2020-07-08 ENCOUNTER — Other Ambulatory Visit: Payer: Self-pay | Admitting: Hematology & Oncology

## 2020-07-08 DIAGNOSIS — D500 Iron deficiency anemia secondary to blood loss (chronic): Secondary | ICD-10-CM

## 2020-07-08 DIAGNOSIS — C9000 Multiple myeloma not having achieved remission: Secondary | ICD-10-CM

## 2020-07-08 DIAGNOSIS — C773 Secondary and unspecified malignant neoplasm of axilla and upper limb lymph nodes: Secondary | ICD-10-CM

## 2020-07-09 ENCOUNTER — Telehealth: Payer: Self-pay | Admitting: Hematology & Oncology

## 2020-07-09 ENCOUNTER — Other Ambulatory Visit: Payer: Self-pay

## 2020-07-09 ENCOUNTER — Encounter: Payer: Self-pay | Admitting: Hematology & Oncology

## 2020-07-09 ENCOUNTER — Inpatient Hospital Stay: Payer: Medicare PPO | Attending: Hematology & Oncology

## 2020-07-09 ENCOUNTER — Inpatient Hospital Stay (HOSPITAL_BASED_OUTPATIENT_CLINIC_OR_DEPARTMENT_OTHER): Payer: Medicare PPO | Admitting: Hematology & Oncology

## 2020-07-09 ENCOUNTER — Inpatient Hospital Stay: Payer: Medicare PPO

## 2020-07-09 VITALS — BP 155/88 | HR 87 | Temp 98.4°F | Resp 16 | Wt 134.0 lb

## 2020-07-09 DIAGNOSIS — C50911 Malignant neoplasm of unspecified site of right female breast: Secondary | ICD-10-CM

## 2020-07-09 DIAGNOSIS — D630 Anemia in neoplastic disease: Secondary | ICD-10-CM | POA: Insufficient documentation

## 2020-07-09 DIAGNOSIS — I429 Cardiomyopathy, unspecified: Secondary | ICD-10-CM | POA: Diagnosis not present

## 2020-07-09 DIAGNOSIS — D500 Iron deficiency anemia secondary to blood loss (chronic): Secondary | ICD-10-CM

## 2020-07-09 DIAGNOSIS — C9001 Multiple myeloma in remission: Secondary | ICD-10-CM | POA: Insufficient documentation

## 2020-07-09 DIAGNOSIS — Z853 Personal history of malignant neoplasm of breast: Secondary | ICD-10-CM | POA: Insufficient documentation

## 2020-07-09 DIAGNOSIS — Z79899 Other long term (current) drug therapy: Secondary | ICD-10-CM | POA: Diagnosis not present

## 2020-07-09 DIAGNOSIS — Z95828 Presence of other vascular implants and grafts: Secondary | ICD-10-CM

## 2020-07-09 DIAGNOSIS — R634 Abnormal weight loss: Secondary | ICD-10-CM | POA: Insufficient documentation

## 2020-07-09 DIAGNOSIS — C773 Secondary and unspecified malignant neoplasm of axilla and upper limb lymph nodes: Secondary | ICD-10-CM | POA: Diagnosis not present

## 2020-07-09 DIAGNOSIS — C9000 Multiple myeloma not having achieved remission: Secondary | ICD-10-CM

## 2020-07-09 LAB — CBC WITH DIFFERENTIAL (CANCER CENTER ONLY)
Abs Immature Granulocytes: 0.03 10*3/uL (ref 0.00–0.07)
Basophils Absolute: 0.1 10*3/uL (ref 0.0–0.1)
Basophils Relative: 1 %
Eosinophils Absolute: 0 10*3/uL (ref 0.0–0.5)
Eosinophils Relative: 0 %
HCT: 43.7 % (ref 36.0–46.0)
Hemoglobin: 14.3 g/dL (ref 12.0–15.0)
Immature Granulocytes: 0 %
Lymphocytes Relative: 18 %
Lymphs Abs: 1.3 10*3/uL (ref 0.7–4.0)
MCH: 32.6 pg (ref 26.0–34.0)
MCHC: 32.7 g/dL (ref 30.0–36.0)
MCV: 99.8 fL (ref 80.0–100.0)
Monocytes Absolute: 0.6 10*3/uL (ref 0.1–1.0)
Monocytes Relative: 8 %
Neutro Abs: 5.2 10*3/uL (ref 1.7–7.7)
Neutrophils Relative %: 73 %
Platelet Count: 235 10*3/uL (ref 150–400)
RBC: 4.38 MIL/uL (ref 3.87–5.11)
RDW: 13.9 % (ref 11.5–15.5)
WBC Count: 7.2 10*3/uL (ref 4.0–10.5)
nRBC: 0 % (ref 0.0–0.2)

## 2020-07-09 LAB — CMP (CANCER CENTER ONLY)
ALT: 9 U/L (ref 0–44)
AST: 16 U/L (ref 15–41)
Albumin: 4.1 g/dL (ref 3.5–5.0)
Alkaline Phosphatase: 61 U/L (ref 38–126)
Anion gap: 12 (ref 5–15)
BUN: 17 mg/dL (ref 8–23)
CO2: 26 mmol/L (ref 22–32)
Calcium: 10.7 mg/dL — ABNORMAL HIGH (ref 8.9–10.3)
Chloride: 100 mmol/L (ref 98–111)
Creatinine: 1.12 mg/dL — ABNORMAL HIGH (ref 0.44–1.00)
GFR, Est AFR Am: 52 mL/min — ABNORMAL LOW (ref >60)
GFR, Estimated: 45 mL/min — ABNORMAL LOW (ref >60)
Glucose, Bld: 101 mg/dL — ABNORMAL HIGH (ref 70–99)
Potassium: 4 mmol/L (ref 3.5–5.1)
Sodium: 138 mmol/L (ref 135–145)
Total Bilirubin: 1 mg/dL (ref 0.3–1.2)
Total Protein: 7.7 g/dL (ref 6.5–8.1)

## 2020-07-09 MED ORDER — SODIUM CHLORIDE 0.9% FLUSH
10.0000 mL | INTRAVENOUS | Status: DC | PRN
  Administered 2020-07-09: 10 mL via INTRAVENOUS
  Filled 2020-07-09: qty 10

## 2020-07-09 MED ORDER — HEPARIN SOD (PORK) LOCK FLUSH 100 UNIT/ML IV SOLN
500.0000 [IU] | Freq: Once | INTRAVENOUS | Status: AC
  Administered 2020-07-09: 500 [IU] via INTRAVENOUS
  Filled 2020-07-09: qty 5

## 2020-07-09 NOTE — Progress Notes (Signed)
Hematology and Oncology Follow Up Visit  Jacqueline Rivas [id number] [birth year] 86 y.o. 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 is back for a long awaited follow-up.  We basically have not seen her for almost 2 years.  She moved down to [place].  She has family down there.  Unfortunately, she really has never seen a doctor while she has been down there.  Her son called me yesterday saying that she had lost quite a bit of weight.  He was worried about the possibility of her myeloma or breast cancer recurring.  Again, we have not seen her for almost 2 years.  She has not had a Port-A-Cath flushed for 2 years.  She looks okay to me.  She has lost about 20 pounds since we last saw her.  She apparently was quite sick recently.  She tried to take care of her self with natural remedies.  I am really not surprised by this.  There has been no problems with the coronavirus as far she knows.  There is been no issues with bleeding.  She has had no change in bowel or bladder habits.  She has had no obvious nausea or vomiting.  Her appetite was down but seems to be getting little bit better.  She has had no problems with pain.  When we last saw her, there was no monoclonal spike in her blood.  Her last CA 27.29 a couple years ago was 17.2.  There has been no headache.  She has had no obvious cardiac issues.  She does have some cardiomyopathy but I do not think this is any worse.  She has had no evidence of congestive heart failure.  Currently, her performance  status is by ECOG 2.   Medications:  Current Outpatient Medications:  .  lisinopril (ZESTRIL) 5 MG tablet, Take 5 mg by mouth daily., Disp: , Rfl:  .  Zinc Sulfate (ZINC 15 PO), Take 1 tablet by mouth daily., Disp: , Rfl:  .  aspirin EC 81 MG tablet, Take 162 mg by mouth daily., Disp: , Rfl:  .  calcium carbonate (OS-CAL) 600 MG TABS tablet, Take 1,200 mg by mouth daily with breakfast., Disp: , Rfl:  .  Cholecalciferol (VITAMIN D3) 2000 units TABS, , Disp: , Rfl:  .  Coenzyme Q10 (CO Q 10) 100 MG CAPS, Take 100 mg by mouth every morning. , Disp: , Rfl:  .  magnesium 30 MG tablet, Take 30 mg by mouth every morning., Disp: , Rfl:  .  Potassium 99 MG TABS, , Disp: , Rfl:  .  zolendronic acid (ZOMETA) 4 MG/5ML injection, Inject into the vein., Disp: , Rfl:  No current facility-administered medications for this visit.  Facility-Administered Medications Ordered in Other Visits:  .  sodium chloride flush (NS) 0.9 % injection 10 mL, 10 mL, Intravenous, PRN, [place], Sarah M, NP, 10 mL at 05/11/18 1331 .  sodium chloride flush (NS)  0.9 % injection 10 mL, 10 mL, Intravenous, PRN, [name], MD, 10 mL at 07/09/20 1639  Allergies: No Known Allergies  Past Medical History, Surgical history, Social history, and Family History were reviewed and updated.  Review of Systems: Review of Systems  Constitutional: Negative.   HENT: Negative.   Eyes: Negative.   Respiratory: Negative.   Cardiovascular: Positive for leg swelling.  Gastrointestinal: Negative.   Genitourinary: Negative.   Musculoskeletal: Positive for joint pain and myalgias.  Skin: Negative.   Endo/Heme/Allergies: Negative.   Psychiatric/Behavioral: Negative.      Physical Exam:  weight is 134 lb (60.8 kg). Her oral temperature is 98.4 F (36.9 C). Her blood pressure is 155/88 (abnormal) and her pulse is 87. Her respiration is 16 and oxygen saturation is 100%.   Physical Exam Vitals reviewed.  HENT:     Head:  Normocephalic and atraumatic.  Eyes:     Pupils: Pupils are equal, round, and reactive to light.  Cardiovascular:     Rate and Rhythm: Normal rate and regular rhythm.     Heart sounds: Normal heart sounds.  Pulmonary:     Effort: Pulmonary effort is normal.     Breath sounds: Normal breath sounds.  Abdominal:     General: Bowel sounds are normal.     Palpations: Abdomen is soft.  Musculoskeletal:        General: No tenderness or deformity. Normal range of motion.     Cervical back: Normal range of motion.  Lymphadenopathy:     Cervical: No cervical adenopathy.  Skin:    General: Skin is warm and dry.     Findings: No erythema or rash.  Neurological:     Mental Status: She is alert and oriented to person, place, and time.  Psychiatric:        Behavior: Behavior normal.        Thought Content: Thought content normal.        Judgment: Judgment normal.      Lab Results  Component Value Date   WBC 7.2 07/09/2020   HGB 14.3 07/09/2020   HCT 43.7 07/09/2020   MCV 99.8 07/09/2020   PLT 235 07/09/2020     Chemistry      Component Value Date/Time   NA 138 07/09/2020 1338   NA 143 09/23/2017 1019   K 4.0 07/09/2020 1338   K 4.0 09/23/2017 1019   CL 100 07/09/2020 1338   CL 104 09/23/2017 1019   CO2 26 07/09/2020 1338   CO2 26 09/23/2017 1019   BUN 17 07/09/2020 1338   BUN 16 09/23/2017 1019   CREATININE 1.12 (H) 07/09/2020 1338   CREATININE 1.4 (H) 09/23/2017 1019      Component Value Date/Time   CALCIUM 10.7 (H) 07/09/2020 1338   CALCIUM 9.7 09/23/2017 1019   CALCIUM 11.1 (H) 11/13/2012 1526   ALKPHOS 61 07/09/2020 1338   ALKPHOS 61 09/23/2017 1019   AST 16 07/09/2020 1338   ALT 9 07/09/2020 1338   ALT 15 09/23/2017 1019   BILITOT 1.0 07/09/2020 1338     Impression and Plan: Jacqueline Rivas is 86 year old white female. She has both IgA kappa myeloma and stage II breast cancer.   It is very hard to say if she does have a problems with recurrence of her myeloma or  breast cancer.  Her exam certainly is not remarkable for either.  On her labs, her total protein is up a little bit which might suggest myeloma recurrence.  Again, we will see what her myeloma studies show.  I really think she needs to have more close follow-up.  Maybe, we can get her back to see [place] on occasion.  We were able to flush her Port-A-Cath today.  She clearly needs to have her Port-A-Cath flushed.  It was nice to see her again.  She is always quite talkative.  I am just glad that overall, her quality life really is not all that bad.      Marland Kitchen [name], MD 9/21/20215:10 PM

## 2020-07-09 NOTE — Telephone Encounter (Signed)
Called and and spoke with son regarding appointments scheudled per 9/20 staff message

## 2020-07-10 ENCOUNTER — Telehealth: Payer: Self-pay | Admitting: Hematology & Oncology

## 2020-07-10 LAB — KAPPA/LAMBDA LIGHT CHAINS
Kappa free light chain: 365.7 mg/L — ABNORMAL HIGH (ref 3.3–19.4)
Kappa, lambda light chain ratio: 27.29 — ABNORMAL HIGH (ref 0.26–1.65)
Lambda free light chains: 13.4 mg/L (ref 5.7–26.3)

## 2020-07-10 LAB — IGG, IGA, IGM
IgA: 975 mg/dL — ABNORMAL HIGH (ref 64–422)
IgG (Immunoglobin G), Serum: 688 mg/dL (ref 586–1602)
IgM (Immunoglobulin M), Srm: 94 mg/dL (ref 26–217)

## 2020-07-10 LAB — CANCER ANTIGEN 27.29: CA 27.29: 21.9 U/mL (ref 0.0–38.6)

## 2020-07-10 LAB — IRON AND TIBC
Iron: 83 ug/dL (ref 41–142)
Saturation Ratios: 29 % (ref 21–57)
TIBC: 283 ug/dL (ref 236–444)
UIBC: 199 ug/dL (ref 120–384)

## 2020-07-10 LAB — LACTATE DEHYDROGENASE: LDH: 129 U/L (ref 98–192)

## 2020-07-10 LAB — SAMPLE TO BLOOD BANK

## 2020-07-10 LAB — FERRITIN: Ferritin: 250 ng/mL (ref 11–307)

## 2020-07-10 NOTE — Telephone Encounter (Signed)
Appointments scheduled and patient notified as well.  Calendar was mailed also per 9/21 los

## 2020-07-11 LAB — PROTEIN ELECTROPHORESIS, SERUM, WITH REFLEX
A/G Ratio: 1.1 (ref 0.7–1.7)
Albumin ELP: 3.7 g/dL (ref 2.9–4.4)
Alpha-1-Globulin: 0.3 g/dL (ref 0.0–0.4)
Alpha-2-Globulin: 0.9 g/dL (ref 0.4–1.0)
Beta Globulin: 1 g/dL (ref 0.7–1.3)
Gamma Globulin: 1.3 g/dL (ref 0.4–1.8)
Globulin, Total: 3.5 g/dL (ref 2.2–3.9)
M-Spike, %: 0.7 g/dL — ABNORMAL HIGH
SPEP Interpretation: 0
Total Protein ELP: 7.2 g/dL (ref 6.0–8.5)

## 2020-07-11 LAB — IMMUNOFIXATION REFLEX, SERUM
IgA: 1146 mg/dL — ABNORMAL HIGH (ref 64–422)
IgG (Immunoglobin G), Serum: 654 mg/dL (ref 586–1602)
IgM (Immunoglobulin M), Srm: 95 mg/dL (ref 26–217)

## 2020-07-18 ENCOUNTER — Inpatient Hospital Stay (HOSPITAL_BASED_OUTPATIENT_CLINIC_OR_DEPARTMENT_OTHER): Payer: Medicare PPO | Admitting: Hematology & Oncology

## 2020-07-18 ENCOUNTER — Other Ambulatory Visit: Payer: PRIVATE HEALTH INSURANCE

## 2020-07-18 ENCOUNTER — Other Ambulatory Visit: Payer: Self-pay

## 2020-07-18 ENCOUNTER — Telehealth: Payer: Self-pay | Admitting: Pharmacist

## 2020-07-18 ENCOUNTER — Telehealth: Payer: Self-pay | Admitting: Hematology & Oncology

## 2020-07-18 ENCOUNTER — Encounter: Payer: Self-pay | Admitting: Hematology & Oncology

## 2020-07-18 ENCOUNTER — Telehealth: Payer: Self-pay | Admitting: Pharmacy Technician

## 2020-07-18 ENCOUNTER — Other Ambulatory Visit: Payer: Self-pay | Admitting: Hematology & Oncology

## 2020-07-18 ENCOUNTER — Inpatient Hospital Stay: Payer: Medicare PPO

## 2020-07-18 VITALS — BP 174/81 | HR 81 | Temp 97.8°F | Resp 19

## 2020-07-18 DIAGNOSIS — C9001 Multiple myeloma in remission: Secondary | ICD-10-CM | POA: Diagnosis not present

## 2020-07-18 DIAGNOSIS — C9002 Multiple myeloma in relapse: Secondary | ICD-10-CM | POA: Diagnosis not present

## 2020-07-18 MED ORDER — IXAZOMIB CITRATE 3 MG PO CAPS: ORAL_CAPSULE | ORAL | 5 refills | Status: DC

## 2020-07-18 NOTE — Telephone Encounter (Signed)
  Oral Oncology Patient Advocate Encounter   Received notification from [facility] that prior authorization for Jacqueline Rivas is required.   PA submitted on CoverMyMeds Key [id number] Status is pending   Oral Oncology Clinic will continue to follow.  [place] Patient [place] Phone [phone] Fax [phone] 07/19/2020 9:55 AM

## 2020-07-18 NOTE — Telephone Encounter (Signed)
Oral Oncology Pharmacy Student Encounter  Received new prescription for Ninlaro (ixazomib) for the treatment of multiple myeloma in conjunction with (pending MD note completion - will be updated when final note is submitted), planned duration until disease progression or unacceptable drug toxicity.  CMP and CBC from 07/09/20 assessed, Cr was slightly elevated at 1.12 and Ca elevated at 10.7. Prescription dose and frequency assessed.   Current medication list in Epic reviewed, no DDIs with Ninlaro identified.  Evaluated chart and several patient barriers to medication adherence identified. Namely, her lack of follow up for two years along with a self-decided treatment hiatus during that time after moving to [place].   Prescription has been e-scribed to the [facility] for benefits analysis and approval.  Oral Oncology Clinic will continue to follow for insurance authorization, copayment issues, initial counseling and start date.  Jacqueline Rivas D Candidate 2022  [facility] Oral Chemotherapy Navigation Clinic [phone]  07/18/2020 2:21 PM

## 2020-07-18 NOTE — Telephone Encounter (Signed)
Appointments scheduled updated calendar was printed per 9/30 los

## 2020-07-18 NOTE — Progress Notes (Signed)
Hematology and Oncology Follow Up Visit  Jacqueline Rivas [id number] [birth year] 86 y.o. 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  Jacqueline Rivas is back for a much quicker follow-up.  We saw her about a week or so ago.  We have not seen her for about 2 years.  She had moved down to [place].  Unfortunately, her myeloma has recurred.  Her SPEP shows a monoclonal spike of 0.7 g/dL.  Her IgA level is now 1000 mg/dL.  Her kappa light chain is 37 mg/dL.  We really do not have to pursue a extensive work-up.  I would hope think that this is going to change our management.  She wants to stay down in [place].  We will certainly try to treat her with oral therapy.  I think we can try her on Ninlaro.  This should be fairly well tolerated.  It is once a week dosing.  It is 3 weeks on and 1 week off.  I would think that she would tolerate this.  She otherwise, is doing okay.  She is eating okay.  There is no complaints of bony pain.  She does not have cardiac issues.  She has not had Zometa since we last saw her.  We will have to do Zometa when we see her back.   Overall, I would say her performance status is ECOG 2-3.   Medications:  Current Outpatient Medications:  .  aspirin EC 81 MG tablet, Take 162 mg by mouth daily., Disp: , Rfl:  .  calcium carbonate (OS-CAL) 600 MG TABS tablet, Take 1,200 mg by mouth daily with breakfast., Disp: , Rfl:  .  Cholecalciferol (VITAMIN D3) 2000 units TABS, , Disp: , Rfl:  .  Coenzyme Q10 (CO Q 10) 100 MG  CAPS, Take 100 mg by mouth every morning. , Disp: , Rfl:  .  ixazomib citrate (NINLARO) 3 MG capsule, Take 1 capsule (3 mg) by mouth weekly, 3 weeks on, 1 week off, repeat every 4 weeks. Take on an empty stomach 1hr before or 2hr after meals., Disp: 3 capsule, Rfl: 5 .  lisinopril (ZESTRIL) 5 MG tablet, Take 5 mg by mouth daily., Disp: , Rfl:  .  magnesium 30 MG tablet, Take 30 mg by mouth every morning., Disp: , Rfl:  .  Potassium 99 MG TABS, , Disp: , Rfl:  .  Zinc Sulfate (ZINC 15 PO), Take 1 tablet by mouth daily., Disp: , Rfl:  .  zolendronic acid (ZOMETA) 4 MG/5ML injection, Inject into the vein., Disp: , Rfl:  No current facility-administered medications for this visit.  Facility-Administered Medications Ordered in Other Visits:  .  sodium chloride flush (NS) 0.9 % injection 10 mL, 10 mL, Intravenous, PRN, Jacqu[place] Rivas, Sarah M, NP, 10 mL at 05/11/18 1331  Allergies: No Known Allergies  Past Medical History, Surgical history, Social history, and Family History were reviewed and updated.  Review  of Systems: Review of Systems  Constitutional: Negative.   HENT: Negative.   Eyes: Negative.   Respiratory: Negative.   Cardiovascular: Positive for leg swelling.  Gastrointestinal: Negative.   Genitourinary: Negative.   Musculoskeletal: Positive for joint pain and myalgias.  Skin: Negative.   Endo/Heme/Allergies: Negative.   Psychiatric/Behavioral: Negative.      Physical Exam:  oral temperature is 97.8 F (36.6 C). Her blood pressure is 174/81 (abnormal) and her pulse is 81. Her respiration is 19 and oxygen saturation is 97%.   Physical Exam Vitals reviewed.  HENT:     Head: Normocephalic and atraumatic.  Eyes:     Pupils: Pupils are equal, round, and reactive to light.  Cardiovascular:     Rate and Rhythm: Normal rate and regular rhythm.     Heart sounds: Normal heart sounds.  Pulmonary:     Effort: Pulmonary effort is normal.     Breath sounds: Normal breath sounds.   Abdominal:     General: Bowel sounds are normal.     Palpations: Abdomen is soft.  Musculoskeletal:        General: No tenderness or deformity. Normal range of motion.     Cervical back: Normal range of motion.  Lymphadenopathy:     Cervical: No cervical adenopathy.  Skin:    General: Skin is warm and dry.     Findings: No erythema or rash.  Neurological:     Mental Status: She is alert and oriented to person, place, and time.  Psychiatric:        Behavior: Behavior normal.        Thought Content: Thought content normal.        Judgment: Judgment normal.      Lab Results  Component Value Date   WBC 7.2 07/09/2020   HGB 14.3 07/09/2020   HCT 43.7 07/09/2020   MCV 99.8 07/09/2020   PLT 235 07/09/2020     Chemistry      Component Value Date/Time   NA 138 07/09/2020 1338   NA 143 09/23/2017 1019   K 4.0 07/09/2020 1338   K 4.0 09/23/2017 1019   CL 100 07/09/2020 1338   CL 104 09/23/2017 1019   CO2 26 07/09/2020 1338   CO2 26 09/23/2017 1019   BUN 17 07/09/2020 1338   BUN 16 09/23/2017 1019   CREATININE 1.12 (H) 07/09/2020 1338   CREATININE 1.4 (H) 09/23/2017 1019      Component Value Date/Time   CALCIUM 10.7 (H) 07/09/2020 1338   CALCIUM 9.7 09/23/2017 1019   CALCIUM 11.1 (H) 11/13/2012 1526   ALKPHOS 61 07/09/2020 1338   ALKPHOS 61 09/23/2017 1019   AST 16 07/09/2020 1338   ALT 9 07/09/2020 1338   ALT 15 09/23/2017 1019   BILITOT 1.0 07/09/2020 1338     Impression and Plan: [name] is 86 year old white female. She has both IgA kappa myeloma and stage II breast cancer.   She has a relapse of her IgA kappa myeloma.  She has been in remission for at least 5 years.  As such, I would think that she should respond to treatment.  I will try a single agent Ninlaro.  This is done orally.  I will try the 3 mg weekly dose.  She will have 3 weeks on and 1 week off.  If this single agent Ninlaro does not get her myeloma levels down significantly, then I will add  pomalidomide to the Ninlaro.  Again, we want to try  to keep her quality of life good.  By that, she will be at home with her family close by in [place].  We will go ahead and plan to get her back in 1 month.  By then, she should be finished with her first cycle of Ninlaro.  I spent about 45 minutes with she and her son today.  I am just glad that they were able to get an appointment to see me.        [name], MD 9/30/20215:39 PM

## 2020-07-19 NOTE — Telephone Encounter (Signed)
Oral Oncology Patient Advocate Encounter  Prior Authorization for [name] has been approved.    PA# [id number] Effective dates: 07/19/20 through 01/15/2021  Patients co-pay is $2730.39.  Oral Oncology Clinic will continue to follow.   [place] Patient [place] Phone [phone] Fax [phone] 07/19/2020 9:59 AM

## 2020-07-26 NOTE — Telephone Encounter (Signed)
Oral Oncology Patient Advocate Encounter  I have been unable to reach Mrs Dragos to explain her copay and options available for assistance.  I have called her and left a few messages to return my call (10/4, 10/6, 10/8).  She did leave a voicemail on 10/5  when I was on another call, but I have not been able to reach her back.  I also called her son and left him a message today.    Please let us know if they contact the office.  [place] Patient [place] Phone [phone] Fax [phone] 07/26/2020 3:17 PM

## 2020-07-30 ENCOUNTER — Telehealth: Payer: Self-pay | Admitting: Pharmacist

## 2020-07-30 MED FILL — NINLARO 3 MG CAPS: 3 | 28 days supply | Qty: 3 | Fill #0

## 2020-07-30 NOTE — Telephone Encounter (Signed)
Oral Oncology Patient Advocate Encounter  I spoke with Jacqueline Rivas and her son Jacqueline Rivas this morning to set up delivery of Ninlaro.  Address verified for shipment.  [name] will be filled through [facility] and mailed 07/30/20 for delivery 07/31/20.    [name] will call 7-10 days before next refill is due to complete adherence call and set up delivery of medication.     [name] Patient [place] Phone [phone] Fax [phone] 07/30/2020 10:15 AM

## 2020-07-30 NOTE — Telephone Encounter (Signed)
Oral Chemotherapy Pharmacist Encounter  Today we were able to get a hold of Jacqueline Rivas to [employer] and education.  Patient Education I spoke with patient for overview of new oral chemotherapy medication: Ninlaro (ixazomib) for the treatment of recurrent multiple myeloma, planned duration until disease progression or unacceptable drug toxicity.   Pt is doing well. Counseled patient on administration, dosing, side effects, monitoring, drug-food interactions, safe handling, storage, and disposal. Patient will take 1 capsule (3 mg) by mouth weekly, 3 weeks on, 1 week off, repeat every 4 weeks. Take on an empty stomach 1hr before or 2hr after meals.  Side effects include but not limited to: constipation or diarrhea, decreased wbc/plt, neuropathy.    Reviewed with patient importance of keeping a medication schedule and plan for any missed doses.  After discussion with patient one patient barriers to medication adherence identified. Patient was very hard to reach to get set-up for initial medication delivery set-up. Reviewed with patient the refill process at [facility]. She knows they pharmacy will need to speak with her prior to shipping the medication.   [name] voiced understanding and appreciation. All questions answered. Medication handout provided.  Provided patient with [facility] phone number. Patient knows to call the office with questions or concerns. Oral Chemotherapy Navigation Clinic will continue to follow.  Darl Pikes, PharmD, BCPS, BCOP, CPP Hematology/Oncology Clinical Pharmacist Practitioner [facility] [phone]  07/30/2020 10:36 AM

## 2020-08-08 ENCOUNTER — Other Ambulatory Visit: Payer: Self-pay | Admitting: Family

## 2020-08-15 ENCOUNTER — Inpatient Hospital Stay: Payer: Medicare PPO | Admitting: Hematology & Oncology

## 2020-08-15 ENCOUNTER — Inpatient Hospital Stay: Payer: Medicare PPO

## 2020-08-27 MED FILL — NINLARO 3 MG CAPS: 3 | 28 days supply | Qty: 3 | Fill #1

## 2020-09-03 ENCOUNTER — Inpatient Hospital Stay (HOSPITAL_BASED_OUTPATIENT_CLINIC_OR_DEPARTMENT_OTHER): Payer: Medicare PPO | Admitting: Hematology & Oncology

## 2020-09-03 ENCOUNTER — Other Ambulatory Visit: Payer: Self-pay

## 2020-09-03 ENCOUNTER — Inpatient Hospital Stay: Payer: Medicare PPO

## 2020-09-03 ENCOUNTER — Inpatient Hospital Stay: Payer: Medicare PPO | Attending: Hematology & Oncology

## 2020-09-03 ENCOUNTER — Encounter: Payer: Self-pay | Admitting: Hematology & Oncology

## 2020-09-03 ENCOUNTER — Telehealth: Payer: Self-pay

## 2020-09-03 VITALS — BP 185/82 | HR 75 | Temp 97.6°F | Resp 18 | Wt 136.0 lb

## 2020-09-03 DIAGNOSIS — C9002 Multiple myeloma in relapse: Secondary | ICD-10-CM

## 2020-09-03 DIAGNOSIS — C773 Secondary and unspecified malignant neoplasm of axilla and upper limb lymph nodes: Secondary | ICD-10-CM

## 2020-09-03 DIAGNOSIS — C9000 Multiple myeloma not having achieved remission: Secondary | ICD-10-CM

## 2020-09-03 DIAGNOSIS — Z79899 Other long term (current) drug therapy: Secondary | ICD-10-CM | POA: Insufficient documentation

## 2020-09-03 DIAGNOSIS — C50911 Malignant neoplasm of unspecified site of right female breast: Secondary | ICD-10-CM

## 2020-09-03 LAB — CBC WITH DIFFERENTIAL (CANCER CENTER ONLY)
Abs Immature Granulocytes: 0.02 10*3/uL (ref 0.00–0.07)
Basophils Absolute: 0 10*3/uL (ref 0.0–0.1)
Basophils Relative: 1 %
Eosinophils Absolute: 0 10*3/uL (ref 0.0–0.5)
Eosinophils Relative: 0 %
HCT: 38.9 % (ref 36.0–46.0)
Hemoglobin: 12.6 g/dL (ref 12.0–15.0)
Immature Granulocytes: 0 %
Lymphocytes Relative: 21 %
Lymphs Abs: 1 10*3/uL (ref 0.7–4.0)
MCH: 32.3 pg (ref 26.0–34.0)
MCHC: 32.4 g/dL (ref 30.0–36.0)
MCV: 99.7 fL (ref 80.0–100.0)
Monocytes Absolute: 0.7 10*3/uL (ref 0.1–1.0)
Monocytes Relative: 16 %
Neutro Abs: 2.7 10*3/uL (ref 1.7–7.7)
Neutrophils Relative %: 62 %
Platelet Count: 193 10*3/uL (ref 150–400)
RBC: 3.9 MIL/uL (ref 3.87–5.11)
RDW: 13.5 % (ref 11.5–15.5)
WBC Count: 4.5 10*3/uL (ref 4.0–10.5)
nRBC: 0 % (ref 0.0–0.2)

## 2020-09-03 LAB — CMP (CANCER CENTER ONLY)
ALT: 12 U/L (ref 0–44)
AST: 21 U/L (ref 15–41)
Albumin: 3.7 g/dL (ref 3.5–5.0)
Alkaline Phosphatase: 65 U/L (ref 38–126)
Anion gap: 9 (ref 5–15)
BUN: 18 mg/dL (ref 8–23)
CO2: 24 mmol/L (ref 22–32)
Calcium: 9.8 mg/dL (ref 8.9–10.3)
Chloride: 104 mmol/L (ref 98–111)
Creatinine: 1.09 mg/dL — ABNORMAL HIGH (ref 0.44–1.00)
GFR, Estimated: 50 mL/min — ABNORMAL LOW (ref >60)
Glucose, Bld: 91 mg/dL (ref 70–99)
Potassium: 4.1 mmol/L (ref 3.5–5.1)
Sodium: 137 mmol/L (ref 135–145)
Total Bilirubin: 0.4 mg/dL (ref 0.3–1.2)
Total Protein: 6.6 g/dL (ref 6.5–8.1)

## 2020-09-03 LAB — LACTATE DEHYDROGENASE: LDH: 141 U/L (ref 98–192)

## 2020-09-03 MED ORDER — SODIUM CHLORIDE 0.9 % IV SOLN
Freq: Once | INTRAVENOUS | Status: AC
  Filled 2020-09-03: qty 250

## 2020-09-03 MED ORDER — SODIUM CHLORIDE 0.9% FLUSH
10.0000 mL | Freq: Once | INTRAVENOUS | Status: AC | PRN
  Administered 2020-09-03: 10 mL
  Filled 2020-09-03: qty 10

## 2020-09-03 MED ORDER — HEPARIN SOD (PORK) LOCK FLUSH 100 UNIT/ML IV SOLN
500.0000 [IU] | Freq: Once | INTRAVENOUS | Status: AC | PRN
  Administered 2020-09-03: 500 [IU]
  Filled 2020-09-03: qty 5

## 2020-09-03 MED ORDER — ZOLEDRONIC ACID 4 MG/5ML IV CONC
3.0000 mg | Freq: Once | INTRAVENOUS | Status: AC
  Administered 2020-09-03: 3 mg via INTRAVENOUS
  Filled 2020-09-03: qty 3.75

## 2020-09-03 MED ORDER — ZOLEDRONIC ACID 4 MG/100ML IV SOLN: 4.0000 mg | Freq: Once | INTRAVENOUS | Status: DC

## 2020-09-03 NOTE — Progress Notes (Signed)
Pt discharged in no apparent distress. Pt left ambulatory with walker  assistance. Pt aware of discharge instructions and verbalized understanding and had no further questions.

## 2020-09-03 NOTE — Patient Instructions (Signed)
Zoledronic Acid injection (Hypercalcemia, Oncology) What is this medicine? ZOLEDRONIC ACID (ZOE le dron ik AS id) lowers the amount of calcium loss from bone. It is used to treat too much calcium in your blood from cancer. It is also used to prevent complications of cancer that has spread to the bone. This medicine may be used for other purposes; ask your health care provider or pharmacist if you have questions. COMMON BRAND NAME(S): Zometa What should I tell my health care provider before I take this medicine? They need to know if you have any of these conditions:  aspirin-sensitive asthma  cancer, especially if you are receiving medicines used to treat cancer  dental disease or wear dentures  infection  kidney disease  receiving corticosteroids like dexamethasone or prednisone  an unusual or allergic reaction to zoledronic acid, other medicines, foods, dyes, or preservatives  pregnant or trying to get pregnant  breast-feeding How should I use this medicine? This medicine is for infusion into a vein. It is given by a health care professional in a hospital or clinic setting. Talk to your pediatrician regarding the use of this medicine in children. Special care may be needed. Overdosage: If you think you have taken too much of this medicine contact a poison control center or emergency room at once. NOTE: This medicine is only for you. Do not share this medicine with others. What if I miss a dose? It is important not to miss your dose. Call your doctor or health care professional if you are unable to keep an appointment. What may interact with this medicine?  certain antibiotics given by injection  NSAIDs, medicines for pain and inflammation, like ibuprofen or naproxen  some diuretics like bumetanide, furosemide  teriparatide  thalidomide This list may not describe all possible interactions. Give your health care provider a list of all the medicines, herbs, non-prescription  drugs, or dietary supplements you use. Also tell them if you smoke, drink alcohol, or use illegal drugs. Some items may interact with your medicine. What should I watch for while using this medicine? Visit your doctor or health care professional for regular checkups. It may be some time before you see the benefit from this medicine. Do not stop taking your medicine unless your doctor tells you to. Your doctor may order blood tests or other tests to see how you are doing. Women should inform their doctor if they wish to become pregnant or think they might be pregnant. There is a potential for serious side effects to an unborn child. Talk to your health care professional or pharmacist for more information. You should make sure that you get enough calcium and vitamin D while you are taking this medicine. Discuss the foods you eat and the vitamins you take with your health care professional. Some people who take this medicine have severe bone, joint, and/or muscle pain. This medicine may also increase your risk for jaw problems or a broken thigh bone. Tell your doctor right away if you have severe pain in your jaw, bones, joints, or muscles. Tell your doctor if you have any pain that does not go away or that gets worse. Tell your dentist and dental surgeon that you are taking this medicine. You should not have major dental surgery while on this medicine. See your dentist to have a dental exam and fix any dental problems before starting this medicine. Take good care of your teeth while on this medicine. Make sure you see your dentist for regular follow-up   appointments. What side effects may I notice from receiving this medicine? Side effects that you should report to your doctor or health care professional as soon as possible:  allergic reactions like skin rash, itching or hives, swelling of the face, lips, or tongue  anxiety, confusion, or depression  breathing problems  changes in vision  eye  pain  feeling faint or lightheaded, falls  jaw pain, especially after dental work  mouth sores  muscle cramps, stiffness, or weakness  redness, blistering, peeling or loosening of the skin, including inside the mouth  trouble passing urine or change in the amount of urine Side effects that usually do not require medical attention (report to your doctor or health care professional if they continue or are bothersome):  bone, joint, or muscle pain  constipation  diarrhea  fever  hair loss  irritation at site where injected  loss of appetite  nausea, vomiting  stomach upset  trouble sleeping  trouble swallowing  weak or tired This list may not describe all possible side effects. Call your doctor for medical advice about side effects. You may report side effects to FDA at 1-800-FDA-1088. Where should I keep my medicine? This drug is given in a hospital or clinic and will not be stored at home. NOTE: This sheet is a summary. It may not cover all possible information. If you have questions about this medicine, talk to your doctor, pharmacist, or health care provider.  2020 Elsevier/Gold Standard (2014-03-03 14:19:39)  

## 2020-09-03 NOTE — Telephone Encounter (Signed)
Called pt and lm with appts per 09/03/20 los...  AOM

## 2020-09-03 NOTE — Patient Instructions (Signed)

## 2020-09-03 NOTE — Progress Notes (Signed)
Hematology and Oncology Follow Up Visit  Jacqueline Rivas [id number] Jul 05, 1932 86 y.o. 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  We now have her on Ninlaro.  She seems be doing pretty well with it for right now.  She does have some itching on occasion.  There is been no problems with nausea or vomiting.  She has had no bony pain.  She has had no cough or shortness of breath.  She has little bit of leg swelling.  I know she has some cardiac issues.  Thankfully I do not think she has had any exacerbations of her heart.  She will get her Zometa today.  Overall, I really think she is doing pretty well.  She does have little of a tremor.  Her appetite has been okay.  She has had no problems with her appetite.  She has had no diarrhea.  Again, there is no rashes.  She has had no headache.  Her blood pressure is on the high side.  She not take blood pressure medication.  She will follow-up with her doctor down in [place] and have this addressed.  Currently, her performance status is ECOG 2-3.     Medications:  Current Outpatient Medications:  .  aspirin EC 81 MG tablet, Take 162 mg by mouth daily., Disp: , Rfl:  .  calcium carbonate (OS-CAL) 600 MG TABS tablet, Take 1,200 mg by mouth daily with breakfast., Disp: , Rfl:  .  Cholecalciferol (VITAMIN D3) 2000 units TABS, , Disp: , Rfl:  .  Coenzyme Q10 (CO Q 10) 100 MG CAPS, Take 100  mg by mouth every morning. , Disp: , Rfl:  .  ixazomib citrate (NINLARO) 3 MG capsule, Take 1 capsule (3 mg) by mouth weekly, 3 weeks on, 1 week off, repeat every 4 weeks. Take on an empty stomach 1hr before or 2hr after meals., Disp: 3 capsule, Rfl: 5 .  lisinopril (ZESTRIL) 5 MG tablet, Take 5 mg by mouth daily., Disp: , Rfl:  .  magnesium 30 MG tablet, Take 30 mg by mouth every morning., Disp: , Rfl:  .  Potassium 99 MG TABS, , Disp: , Rfl:  .  Zinc Sulfate (ZINC 15 PO), Take 1 tablet by mouth daily., Disp: , Rfl:  .  zolendronic acid (ZOMETA) 4 MG/5ML injection, Inject into the vein., Disp: , Rfl:  No current facility-administered medications for this visit.  Facility-Administered Medications Ordered in Other Visits:  .  sodium chloride flush (NS) 0.9 % injection 10 mL, 10 mL, Intravenous, PRN, [place], Sarah M, NP, 10 mL at 05/11/18 1331  Allergies: No Known Allergies  Past Medical History, Surgical history, Social history, and Family History were reviewed and updated.  Review of Systems: Review of Systems  Constitutional: Negative.   HENT:  Negative.   Eyes: Negative.   Respiratory: Negative.   Cardiovascular: Positive for leg swelling.  Gastrointestinal: Negative.   Genitourinary: Negative.   Musculoskeletal: Positive for joint pain and myalgias.  Skin: Negative.   Endo/Heme/Allergies: Negative.   Psychiatric/Behavioral: Negative.      Physical Exam:  weight is 136 lb (61.7 kg). Her oral temperature is 97.6 F (36.4 C). Her blood pressure is 185/82 (abnormal) and her pulse is 75. Her respiration is 18 and oxygen saturation is 99%.   Physical Exam Vitals reviewed.  HENT:     Head: Normocephalic and atraumatic.  Eyes:     Pupils: Pupils are equal, round, and reactive to light.  Cardiovascular:     Rate and Rhythm: Normal rate and regular rhythm.     Heart sounds: Normal heart sounds.  Pulmonary:     Effort: Pulmonary effort is normal.     Breath sounds: Normal  breath sounds.  Abdominal:     General: Bowel sounds are normal.     Palpations: Abdomen is soft.  Musculoskeletal:        General: No tenderness or deformity. Normal range of motion.     Cervical back: Normal range of motion.  Lymphadenopathy:     Cervical: No cervical adenopathy.  Skin:    General: Skin is warm and dry.     Findings: No erythema or rash.  Neurological:     Mental Status: She is alert and oriented to person, place, and time.  Psychiatric:        Behavior: Behavior normal.        Thought Content: Thought content normal.        Judgment: Judgment normal.      Lab Results  Component Value Date   WBC 4.5 09/03/2020   HGB 12.6 09/03/2020   HCT 38.9 09/03/2020   MCV 99.7 09/03/2020   PLT 193 09/03/2020     Chemistry      Component Value Date/Time   NA 137 09/03/2020 1200   NA 143 09/23/2017 1019   K 4.1 09/03/2020 1200   K 4.0 09/23/2017 1019   CL 104 09/03/2020 1200   CL 104 09/23/2017 1019   CO2 24 09/03/2020 1200   CO2 26 09/23/2017 1019   BUN 18 09/03/2020 1200   BUN 16 09/23/2017 1019   CREATININE 1.09 (H) 09/03/2020 1200   CREATININE 1.4 (H) 09/23/2017 1019      Component Value Date/Time   CALCIUM 9.8 09/03/2020 1200   CALCIUM 9.7 09/23/2017 1019   CALCIUM 11.1 (H) 11/13/2012 1526   ALKPHOS 65 09/03/2020 1200   ALKPHOS 61 09/23/2017 1019   AST 21 09/03/2020 1200   ALT 12 09/03/2020 1200   ALT 15 09/23/2017 1019   BILITOT 0.4 09/03/2020 1200     Impression and Plan: Jacqueline Rivas is 86 year old white female. She has both IgA kappa myeloma and stage II breast cancer.   She has a relapse of her IgA kappa myeloma.  She has been in remission for at least 5 years.  Her total protein is down.  I have to believe that this is going to be an indicator that the [name] is helping.  We will see what her myeloma's test show.  I will plan to get her back in 5 weeks.  I want her back the week after Christmas.  I am sure that she will have a wonderful  Thanksgiving and Christmas with her family.  [name], MD 11/16/202112:36 PM

## 2020-09-04 LAB — IGG, IGA, IGM
IgA: 1315 mg/dL — ABNORMAL HIGH (ref 64–422)
IgG (Immunoglobin G), Serum: 599 mg/dL (ref 586–1602)
IgM (Immunoglobulin M), Srm: 66 mg/dL (ref 26–217)

## 2020-09-04 LAB — KAPPA/LAMBDA LIGHT CHAINS
Kappa free light chain: 612.6 mg/L — ABNORMAL HIGH (ref 3.3–19.4)
Kappa, lambda light chain ratio: 54.21 — ABNORMAL HIGH (ref 0.26–1.65)
Lambda free light chains: 11.3 mg/L (ref 5.7–26.3)

## 2020-09-04 LAB — CANCER ANTIGEN 27.29: CA 27.29: 15.9 U/mL (ref 0.0–38.6)

## 2020-09-06 LAB — PROTEIN ELECTROPHORESIS, SERUM, WITH REFLEX
A/G Ratio: 1 (ref 0.7–1.7)
Albumin ELP: 3.3 g/dL (ref 2.9–4.4)
Alpha-1-Globulin: 0.3 g/dL (ref 0.0–0.4)
Alpha-2-Globulin: 0.8 g/dL (ref 0.4–1.0)
Beta Globulin: 1 g/dL (ref 0.7–1.3)
Gamma Globulin: 1.4 g/dL (ref 0.4–1.8)
Globulin, Total: 3.4 g/dL (ref 2.2–3.9)
M-Spike, %: 0.8 g/dL — ABNORMAL HIGH
SPEP Interpretation: 0
Total Protein ELP: 6.7 g/dL (ref 6.0–8.5)

## 2020-09-06 LAB — IMMUNOFIXATION REFLEX, SERUM
IgA: 1322 mg/dL — ABNORMAL HIGH (ref 64–422)
IgG (Immunoglobin G), Serum: 590 mg/dL (ref 586–1602)
IgM (Immunoglobulin M), Srm: 68 mg/dL (ref 26–217)

## 2020-09-24 MED FILL — NINLARO 3 MG CAPS: 3 | 28 days supply | Qty: 3 | Fill #2

## 2020-09-25 ENCOUNTER — Telehealth: Payer: Self-pay | Admitting: *Deleted

## 2020-09-25 NOTE — Telephone Encounter (Signed)
Call received from [facility] PA at patient's independent living facility to inform Dr. Marin Olp that pt has gross peripheral edema.  Dr. Marin Olp notified and order received for pt to continue Ninlaro and for diuretic to be added.  Dr. Antonieta Pert last office note faxed to [facility] at [phone] per her request.

## 2020-10-08 ENCOUNTER — Ambulatory Visit: Payer: PRIVATE HEALTH INSURANCE

## 2020-10-08 ENCOUNTER — Other Ambulatory Visit: Payer: PRIVATE HEALTH INSURANCE

## 2020-10-08 ENCOUNTER — Ambulatory Visit: Payer: PRIVATE HEALTH INSURANCE | Admitting: Hematology & Oncology

## 2020-10-16 ENCOUNTER — Encounter: Payer: Self-pay | Admitting: Hematology & Oncology

## 2020-10-16 ENCOUNTER — Inpatient Hospital Stay: Payer: Medicare PPO | Attending: Hematology & Oncology

## 2020-10-16 ENCOUNTER — Telehealth: Payer: Self-pay

## 2020-10-16 ENCOUNTER — Inpatient Hospital Stay: Payer: Medicare PPO

## 2020-10-16 ENCOUNTER — Inpatient Hospital Stay (HOSPITAL_BASED_OUTPATIENT_CLINIC_OR_DEPARTMENT_OTHER): Payer: Medicare PPO | Admitting: Hematology & Oncology

## 2020-10-16 ENCOUNTER — Other Ambulatory Visit: Payer: Self-pay

## 2020-10-16 VITALS — BP 178/78 | HR 80 | Temp 98.4°F | Resp 20 | Wt 132.8 lb

## 2020-10-16 DIAGNOSIS — D630 Anemia in neoplastic disease: Secondary | ICD-10-CM | POA: Diagnosis not present

## 2020-10-16 DIAGNOSIS — Z171 Estrogen receptor negative status [ER-]: Secondary | ICD-10-CM | POA: Insufficient documentation

## 2020-10-16 DIAGNOSIS — C9000 Multiple myeloma not having achieved remission: Secondary | ICD-10-CM

## 2020-10-16 DIAGNOSIS — Z79899 Other long term (current) drug therapy: Secondary | ICD-10-CM | POA: Diagnosis not present

## 2020-10-16 DIAGNOSIS — C9002 Multiple myeloma in relapse: Secondary | ICD-10-CM | POA: Insufficient documentation

## 2020-10-16 DIAGNOSIS — I429 Cardiomyopathy, unspecified: Secondary | ICD-10-CM | POA: Insufficient documentation

## 2020-10-16 DIAGNOSIS — C50911 Malignant neoplasm of unspecified site of right female breast: Secondary | ICD-10-CM | POA: Diagnosis not present

## 2020-10-16 DIAGNOSIS — C773 Secondary and unspecified malignant neoplasm of axilla and upper limb lymph nodes: Secondary | ICD-10-CM

## 2020-10-16 LAB — CBC WITH DIFFERENTIAL (CANCER CENTER ONLY)
Abs Immature Granulocytes: 0.04 10*3/uL (ref 0.00–0.07)
Basophils Absolute: 0 10*3/uL (ref 0.0–0.1)
Basophils Relative: 1 %
Eosinophils Absolute: 0 10*3/uL (ref 0.0–0.5)
Eosinophils Relative: 0 %
HCT: 37.4 % (ref 36.0–46.0)
Hemoglobin: 12.2 g/dL (ref 12.0–15.0)
Immature Granulocytes: 1 %
Lymphocytes Relative: 10 %
Lymphs Abs: 0.6 10*3/uL — ABNORMAL LOW (ref 0.7–4.0)
MCH: 32 pg (ref 26.0–34.0)
MCHC: 32.6 g/dL (ref 30.0–36.0)
MCV: 98.2 fL (ref 80.0–100.0)
Monocytes Absolute: 0.8 10*3/uL (ref 0.1–1.0)
Monocytes Relative: 13 %
Neutro Abs: 4.7 10*3/uL (ref 1.7–7.7)
Neutrophils Relative %: 75 %
Platelet Count: 186 10*3/uL (ref 150–400)
RBC: 3.81 MIL/uL — ABNORMAL LOW (ref 3.87–5.11)
RDW: 13.9 % (ref 11.5–15.5)
WBC Count: 6.2 10*3/uL (ref 4.0–10.5)
nRBC: 0 % (ref 0.0–0.2)

## 2020-10-16 LAB — CMP (CANCER CENTER ONLY)
ALT: 17 U/L (ref 0–44)
AST: 26 U/L (ref 15–41)
Albumin: 3.6 g/dL (ref 3.5–5.0)
Alkaline Phosphatase: 60 U/L (ref 38–126)
Anion gap: 10 (ref 5–15)
BUN: 29 mg/dL — ABNORMAL HIGH (ref 8–23)
CO2: 23 mmol/L (ref 22–32)
Calcium: 9.5 mg/dL (ref 8.9–10.3)
Chloride: 102 mmol/L (ref 98–111)
Creatinine: 1.18 mg/dL — ABNORMAL HIGH (ref 0.44–1.00)
GFR, Estimated: 45 mL/min — ABNORMAL LOW (ref >60)
Glucose, Bld: 86 mg/dL (ref 70–99)
Potassium: 4.1 mmol/L (ref 3.5–5.1)
Sodium: 135 mmol/L (ref 135–145)
Total Bilirubin: 0.6 mg/dL (ref 0.3–1.2)
Total Protein: 7.1 g/dL (ref 6.5–8.1)

## 2020-10-16 LAB — LACTATE DEHYDROGENASE: LDH: 135 U/L (ref 98–192)

## 2020-10-16 MED ORDER — SODIUM CHLORIDE 0.9% FLUSH
10.0000 mL | Freq: Once | INTRAVENOUS | Status: AC | PRN
  Administered 2020-10-16: 11:00:00 10 mL
  Filled 2020-10-16: qty 10

## 2020-10-16 MED ORDER — HEPARIN SOD (PORK) LOCK FLUSH 100 UNIT/ML IV SOLN
500.0000 [IU] | Freq: Once | INTRAVENOUS | Status: AC | PRN
  Administered 2020-10-16: 11:00:00 500 [IU]
  Filled 2020-10-16: qty 5

## 2020-10-16 NOTE — Progress Notes (Signed)
Hematology and Oncology Follow Up Visit  Jacqueline Rivas [id number] 10/26/1931 86 y.o. 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 is back for a follow-up.  She is doing fairly well.  She did have a nice Thanksgiving and nice Christmas with her family.  She is on Ninlaro.  She was having some swelling in the legs with the Ninlaro.  Her she saw the PA at her assisted living facility.  They really did a good job and got her on some diuretic.  Her blood pressure is a little bit better although it is still on the high side.  She has been on the [facility] now for complete 2-3 cycles.  When we saw her in November, the Kappa light chain was up quite a bit.  It was  up to 632 mg/L.  Hopefully, it will be lower now.  If not, we will probably need to add pomalidomide.  She seems to be eating fairly well.  I am not sure how much she really is drinking with respect to water.  Currently, her performance status is ECOG 2-3.     Medications:  Current Outpatient Medications:  .  calcium carbonate (OS-CAL) 600 MG TABS tablet, Take 1,200 mg by mouth daily with breakfast., Disp: , Rfl:  .  Cholecalciferol (VITAMIN D3) 2000 units TABS, , Disp: , Rfl:  .  Coenzyme Q10 (CO Q 10) 100 MG CAPS, Take 100 mg by mouth every morning. , Disp: , Rfl:  .  ixazomib citrate (NINLARO) 3 MG capsule, Take 1 capsule (3 mg) by mouth weekly, 3 weeks on, 1 week off, repeat every 4 weeks. Take on an empty stomach  1hr before or 2hr after meals., Disp: 3 capsule, Rfl: 5 .  lisinopril-hydrochlorothiazide (ZESTORETIC) 20-12.5 MG tablet, Take 1 tablet by mouth daily., Disp: , Rfl:  .  magnesium 30 MG tablet, Take 30 mg by mouth every morning., Disp: , Rfl:  .  Potassium 99 MG TABS, , Disp: , Rfl:  .  Zinc Sulfate (ZINC 15 PO), Take 1 tablet by mouth daily., Disp: , Rfl:  .  zolendronic acid (ZOMETA) 4 MG/5ML injection, Inject into the vein., Disp: , Rfl:  .  aspirin EC 81 MG tablet, Take 162 mg by mouth daily. (Patient not taking: Reported on 10/16/2020), Disp: , Rfl:  No current facility-administered medications for this visit.  Facility-Administered Medications Ordered in Other Visits:  .  sodium chloride flush (NS) 0.9 % injection 10 mL, 10 mL, Intravenous, PRN, [place], Jacqueline M, NP, 10 mL at 05/11/18 1331  Allergies: No Known Allergies  Past Medical History, Surgical history, Social history, and Family History were reviewed and updated.  Review of Systems: Review of Systems  Constitutional: Negative.   HENT: Negative.   Eyes: Negative.   Respiratory: Negative.   Cardiovascular: Positive for leg  swelling.  Gastrointestinal: Negative.   Genitourinary: Negative.   Musculoskeletal: Positive for joint pain and myalgias.  Skin: Negative.   Endo/Heme/Allergies: Negative.   Psychiatric/Behavioral: Negative.      Physical Exam:  weight is 132 lb 12.8 oz (60.2 kg). Her oral temperature is 98.4 F (36.9 C). Her blood pressure is 178/78 (abnormal) and her pulse is 80. Her respiration is 20 and oxygen saturation is 99%.   Physical Exam Vitals reviewed.  HENT:     Head: Normocephalic and atraumatic.  Eyes:     Pupils: Pupils are equal, round, and reactive to light.  Cardiovascular:     Rate and Rhythm: Normal rate and regular rhythm.     Heart sounds: Normal heart sounds.  Pulmonary:     Effort: Pulmonary effort is normal.     Breath sounds: Normal breath sounds.  Abdominal:     General:  Bowel sounds are normal.     Palpations: Abdomen is soft.  Musculoskeletal:        General: No tenderness or deformity. Normal range of motion.     Cervical back: Normal range of motion.  Lymphadenopathy:     Cervical: No cervical adenopathy.  Skin:    General: Skin is warm and dry.     Findings: No erythema or rash.  Neurological:     Mental Status: She is alert and oriented to person, place, and time.  Psychiatric:        Behavior: Behavior normal.        Thought Content: Thought content normal.        Judgment: Judgment normal.      Lab Results  Component Value Date   WBC 6.2 10/16/2020   HGB 12.2 10/16/2020   HCT 37.4 10/16/2020   MCV 98.2 10/16/2020   PLT 186 10/16/2020     Chemistry      Component Value Date/Time   NA 135 10/16/2020 1144   NA 143 09/23/2017 1019   K 4.1 10/16/2020 1144   K 4.0 09/23/2017 1019   CL 102 10/16/2020 1144   CL 104 09/23/2017 1019   CO2 23 10/16/2020 1144   CO2 26 09/23/2017 1019   BUN 29 (H) 10/16/2020 1144   BUN 16 09/23/2017 1019   CREATININE 1.18 (H) 10/16/2020 1144   CREATININE 1.4 (H) 09/23/2017 1019      Component Value Date/Time   CALCIUM 9.5 10/16/2020 1144   CALCIUM 9.7 09/23/2017 1019   CALCIUM 11.1 (H) 11/13/2012 1526   ALKPHOS 60 10/16/2020 1144   ALKPHOS 61 09/23/2017 1019   AST 26 10/16/2020 1144   ALT 17 10/16/2020 1144   ALT 15 09/23/2017 1019   BILITOT 0.6 10/16/2020 1144     Impression and Plan: [name] is 86 year old white female. She has both IgA kappa myeloma and stage II breast cancer.   She has a relapse of her IgA kappa myeloma.  The majority the relapses with her light chain.  Again, we will have to see how the light chains look.  Hopefully after being on the Ninlaro now for a good 3 months, we will have the light chain level down.  If not, we will add pomalidomide.  We will still keep her appointment for 1 month.  I am just glad that her quality life is still doing well.      Jacqueline Macho, MD 12/29/20211:45 PM

## 2020-10-16 NOTE — Telephone Encounter (Signed)
appts made and printed for pt per 10/16/20 los...aom

## 2020-10-17 LAB — KAPPA/LAMBDA LIGHT CHAINS
Kappa free light chain: 322.5 mg/L — ABNORMAL HIGH (ref 3.3–19.4)
Kappa, lambda light chain ratio: 28.79 — ABNORMAL HIGH (ref 0.26–1.65)
Lambda free light chains: 11.2 mg/L (ref 5.7–26.3)

## 2020-10-17 LAB — IGG, IGA, IGM
IgA: 1423 mg/dL — ABNORMAL HIGH (ref 64–422)
IgG (Immunoglobin G), Serum: 589 mg/dL (ref 586–1602)
IgM (Immunoglobulin M), Srm: 54 mg/dL (ref 26–217)

## 2020-10-21 LAB — PROTEIN ELECTROPHORESIS, SERUM, WITH REFLEX
A/G Ratio: 1.1 (ref 0.7–1.7)
Albumin ELP: 3.5 g/dL (ref 2.9–4.4)
Alpha-1-Globulin: 0.2 g/dL (ref 0.0–0.4)
Alpha-2-Globulin: 0.9 g/dL (ref 0.4–1.0)
Beta Globulin: 0.8 g/dL (ref 0.7–1.3)
Gamma Globulin: 1.4 g/dL (ref 0.4–1.8)
Globulin, Total: 3.3 g/dL (ref 2.2–3.9)
M-Spike, %: 0.9 g/dL — ABNORMAL HIGH
SPEP Interpretation: 0
Total Protein ELP: 6.8 g/dL (ref 6.0–8.5)

## 2020-10-21 LAB — IMMUNOFIXATION REFLEX, SERUM
IgA: 1488 mg/dL — ABNORMAL HIGH (ref 64–422)
IgG (Immunoglobin G), Serum: 644 mg/dL (ref 586–1602)
IgM (Immunoglobulin M), Srm: 55 mg/dL (ref 26–217)

## 2020-10-22 MED FILL — NINLARO 3 MG CAPS: 3 | 28 days supply | Qty: 3 | Fill #3

## 2020-11-13 ENCOUNTER — Telehealth: Payer: Self-pay

## 2020-11-13 ENCOUNTER — Telehealth: Payer: Self-pay | Admitting: Hematology & Oncology

## 2020-11-13 ENCOUNTER — Inpatient Hospital Stay: Payer: Medicare PPO

## 2020-11-13 ENCOUNTER — Inpatient Hospital Stay: Payer: Medicare PPO | Attending: Hematology & Oncology

## 2020-11-13 ENCOUNTER — Other Ambulatory Visit: Payer: Self-pay

## 2020-11-13 ENCOUNTER — Inpatient Hospital Stay (HOSPITAL_BASED_OUTPATIENT_CLINIC_OR_DEPARTMENT_OTHER): Payer: Medicare PPO | Admitting: Hematology & Oncology

## 2020-11-13 VITALS — BP 155/71 | HR 86 | Temp 97.8°F | Resp 17 | Wt 135.1 lb

## 2020-11-13 DIAGNOSIS — I429 Cardiomyopathy, unspecified: Secondary | ICD-10-CM | POA: Diagnosis not present

## 2020-11-13 DIAGNOSIS — C9002 Multiple myeloma in relapse: Secondary | ICD-10-CM | POA: Insufficient documentation

## 2020-11-13 DIAGNOSIS — D630 Anemia in neoplastic disease: Secondary | ICD-10-CM | POA: Diagnosis not present

## 2020-11-13 DIAGNOSIS — C50911 Malignant neoplasm of unspecified site of right female breast: Secondary | ICD-10-CM | POA: Diagnosis not present

## 2020-11-13 DIAGNOSIS — C9000 Multiple myeloma not having achieved remission: Secondary | ICD-10-CM

## 2020-11-13 DIAGNOSIS — Z171 Estrogen receptor negative status [ER-]: Secondary | ICD-10-CM | POA: Diagnosis not present

## 2020-11-13 DIAGNOSIS — C773 Secondary and unspecified malignant neoplasm of axilla and upper limb lymph nodes: Secondary | ICD-10-CM

## 2020-11-13 DIAGNOSIS — Z79899 Other long term (current) drug therapy: Secondary | ICD-10-CM | POA: Diagnosis not present

## 2020-11-13 LAB — LACTATE DEHYDROGENASE: LDH: 129 U/L (ref 98–192)

## 2020-11-13 LAB — CBC WITH DIFFERENTIAL (CANCER CENTER ONLY)
Abs Immature Granulocytes: 0.03 10*3/uL (ref 0.00–0.07)
Basophils Absolute: 0.1 10*3/uL (ref 0.0–0.1)
Basophils Relative: 1 %
Eosinophils Absolute: 0 10*3/uL (ref 0.0–0.5)
Eosinophils Relative: 0 %
HCT: 36.2 % (ref 36.0–46.0)
Hemoglobin: 12.2 g/dL (ref 12.0–15.0)
Immature Granulocytes: 1 %
Lymphocytes Relative: 12 %
Lymphs Abs: 0.7 10*3/uL (ref 0.7–4.0)
MCH: 32.4 pg (ref 26.0–34.0)
MCHC: 33.7 g/dL (ref 30.0–36.0)
MCV: 96 fL (ref 80.0–100.0)
Monocytes Absolute: 0.7 10*3/uL (ref 0.1–1.0)
Monocytes Relative: 12 %
Neutro Abs: 4.7 10*3/uL (ref 1.7–7.7)
Neutrophils Relative %: 74 %
Platelet Count: 176 10*3/uL (ref 150–400)
RBC: 3.77 MIL/uL — ABNORMAL LOW (ref 3.87–5.11)
RDW: 14.6 % (ref 11.5–15.5)
WBC Count: 6.2 10*3/uL (ref 4.0–10.5)
nRBC: 0 % (ref 0.0–0.2)

## 2020-11-13 LAB — CMP (CANCER CENTER ONLY)
ALT: 15 U/L (ref 0–44)
AST: 23 U/L (ref 15–41)
Albumin: 3.8 g/dL (ref 3.5–5.0)
Alkaline Phosphatase: 62 U/L (ref 38–126)
Anion gap: 7 (ref 5–15)
BUN: 20 mg/dL (ref 8–23)
CO2: 27 mmol/L (ref 22–32)
Calcium: 10.2 mg/dL (ref 8.9–10.3)
Chloride: 103 mmol/L (ref 98–111)
Creatinine: 1.22 mg/dL — ABNORMAL HIGH (ref 0.44–1.00)
GFR, Estimated: 43 mL/min — ABNORMAL LOW (ref >60)
Glucose, Bld: 81 mg/dL (ref 70–99)
Potassium: 4.2 mmol/L (ref 3.5–5.1)
Sodium: 137 mmol/L (ref 135–145)
Total Bilirubin: 0.6 mg/dL (ref 0.3–1.2)
Total Protein: 6.8 g/dL (ref 6.5–8.1)

## 2020-11-13 MED ORDER — SODIUM CHLORIDE 0.9% FLUSH
10.0000 mL | Freq: Once | INTRAVENOUS | Status: AC | PRN
  Administered 2020-11-13: 10 mL
  Filled 2020-11-13: qty 10

## 2020-11-13 MED ORDER — HEPARIN SOD (PORK) LOCK FLUSH 100 UNIT/ML IV SOLN
500.0000 [IU] | Freq: Once | INTRAVENOUS | Status: AC | PRN
  Administered 2020-11-13: 500 [IU]
  Filled 2020-11-13: qty 5

## 2020-11-13 NOTE — Telephone Encounter (Signed)
appts printed fort pt  Jacqueline Rivas

## 2020-11-13 NOTE — Telephone Encounter (Signed)
Appointments scheduled calendar printed in office per 1/26 los

## 2020-11-13 NOTE — Patient Instructions (Signed)

## 2020-11-13 NOTE — Progress Notes (Signed)
Hematology and Oncology Follow Up Visit  Jacqueline Rivas [id number] [birth year] 87 y.o. 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 is back for a follow-up. She made it through all the holiday season.  We last saw her right after Christmas.  She had a nice New Year's.  Thankfully, the [name] was started to work.  Her kappa light chain was down to 322 mg/L.  This was down from 631 mg/L in November.  She has little bit of dizziness with the Ninlaro.  It is not causing any nausea or vomiting.  She is not drinking as much as she really should.  I would like to hope that she will be a little more diligent with getting IV fluids in.  She has had no bleeding.  She has had no cough or shortness of breath.  Has been no headache.  She has had no rashes.  Overall, I would say performance status is ECOG 2.    Medications:  Current Outpatient Medications:  .  calcium carbonate (OS-CAL) 600 MG TABS tablet, Take 1,200 mg by mouth daily with breakfast., Disp: , Rfl:  .  Cholecalciferol (VITAMIN D3) 2000 units TABS, daily., Disp: , Rfl:  .  ixazomib citrate (NINLARO) 3 MG capsule, Take 1 capsule (3 mg) by mouth weekly, 3 weeks on, 1 week off, repeat every 4 weeks. Take on an empty stomach 1hr before or 2hr after meals., Disp: 3 capsule, Rfl: 5 .  lisinopril-hydrochlorothiazide (ZESTORETIC) 20-12.5 MG tablet, Take 1 tablet by mouth daily., Disp: , Rfl:  .  magnesium 30 MG tablet, Take 30  mg by mouth every morning., Disp: , Rfl:  .  Potassium 99 MG TABS, , Disp: , Rfl:  .  Zinc Sulfate (ZINC 15 PO), Take 1 tablet by mouth daily., Disp: , Rfl:  .  zolendronic acid (ZOMETA) 4 MG/5ML injection, Inject into the vein., Disp: , Rfl:  .  aspirin EC 81 MG tablet, Take 162 mg by mouth daily. (Patient not taking: No sig reported), Disp: , Rfl:  .  Coenzyme Q10 (CO Q 10) 100 MG CAPS, Take 100 mg by mouth every morning.  (Patient not taking: Reported on 11/13/2020), Disp: , Rfl:  No current facility-administered medications for this visit.  Facility-Administered Medications Ordered in Other Visits:  .  sodium chloride flush (NS) 0.9 % injection 10 mL, 10 mL, Intravenous, PRN, [place], Sarah M, NP, 10 mL at 05/11/18 1331  Allergies: No Known Allergies  Past Medical History, Surgical history, Social history, and Family History were reviewed and updated.  Review of Systems: Review of Systems  Constitutional: Negative.   HENT: Negative.   Eyes: Negative.   Respiratory: Negative.   Cardiovascular: Positive for leg swelling.  Gastrointestinal: Negative.   Genitourinary: Negative.   Musculoskeletal: Positive for joint pain and myalgias.  Skin: Negative.  Endo/Heme/Allergies: Negative.   Psychiatric/Behavioral: Negative.      Physical Exam:  weight is 135 lb 1.3 oz (61.3 kg). Her oral temperature is 97.8 F (36.6 C). Her blood pressure is 155/71 (abnormal) and her pulse is 86. Her respiration is 17 and oxygen saturation is 100%.   Physical Exam Vitals reviewed.  HENT:     Head: Normocephalic and atraumatic.  Eyes:     Pupils: Pupils are equal, round, and reactive to light.  Cardiovascular:     Rate and Rhythm: Normal rate and regular rhythm.     Heart sounds: Normal heart sounds.  Pulmonary:     Effort: Pulmonary effort is normal.     Breath sounds: Normal breath sounds.  Abdominal:     General: Bowel sounds are normal.     Palpations: Abdomen is soft.   Musculoskeletal:        General: No tenderness or deformity. Normal range of motion.     Cervical back: Normal range of motion.  Lymphadenopathy:     Cervical: No cervical adenopathy.  Skin:    General: Skin is warm and dry.     Findings: No erythema or rash.  Neurological:     Mental Status: She is alert and oriented to person, place, and time.  Psychiatric:        Behavior: Behavior normal.        Thought Content: Thought content normal.        Judgment: Judgment normal.      Lab Results  Component Value Date   WBC 6.2 11/13/2020   HGB 12.2 11/13/2020   HCT 36.2 11/13/2020   MCV 96.0 11/13/2020   PLT 176 11/13/2020     Chemistry      Component Value Date/Time   NA 137 11/13/2020 1130   NA 143 09/23/2017 1019   K 4.2 11/13/2020 1130   K 4.0 09/23/2017 1019   CL 103 11/13/2020 1130   CL 104 09/23/2017 1019   CO2 27 11/13/2020 1130   CO2 26 09/23/2017 1019   BUN 20 11/13/2020 1130   BUN 16 09/23/2017 1019   CREATININE 1.22 (H) 11/13/2020 1130   CREATININE 1.4 (H) 09/23/2017 1019      Component Value Date/Time   CALCIUM 10.2 11/13/2020 1130   CALCIUM 9.7 09/23/2017 1019   CALCIUM 11.1 (H) 11/13/2012 1526   ALKPHOS 62 11/13/2020 1130   ALKPHOS 61 09/23/2017 1019   AST 23 11/13/2020 1130   ALT 15 11/13/2020 1130   ALT 15 09/23/2017 1019   BILITOT 0.6 11/13/2020 1130     Impression and Plan: Jacqueline Rivas is 87 year old white female. She has both IgA kappa myeloma and stage II breast cancer.   She has a relapse of her IgA kappa myeloma.  The majority the relapses with her Kappa light chain.  Again, we will have to see how the Kappa light chains look.  Hopefully after being on the Ninlaro now for a good 4 months, we will have the light chain level down even more.  If not, we will add pomalidomide.  We will still keep her appointment for 1 month.  When she comes back, she will get Zometa..  She does have a Port-A-Cath in.  We was flushes when we see her.     [name], MD 1/26/202212:12 PM

## 2020-11-14 LAB — KAPPA/LAMBDA LIGHT CHAINS
Kappa free light chain: 362.4 mg/L — ABNORMAL HIGH (ref 3.3–19.4)
Kappa, lambda light chain ratio: 34.51 — ABNORMAL HIGH (ref 0.26–1.65)
Lambda free light chains: 10.5 mg/L (ref 5.7–26.3)

## 2020-11-14 LAB — IGG, IGA, IGM
IgA: 1431 mg/dL — ABNORMAL HIGH (ref 64–422)
IgG (Immunoglobin G), Serum: 536 mg/dL — ABNORMAL LOW (ref 586–1602)
IgM (Immunoglobulin M), Srm: 47 mg/dL (ref 26–217)

## 2020-11-15 LAB — IMMUNOFIXATION REFLEX, SERUM
IgA: 1471 mg/dL — ABNORMAL HIGH (ref 64–422)
IgG (Immunoglobin G), Serum: 547 mg/dL — ABNORMAL LOW (ref 586–1602)
IgM (Immunoglobulin M), Srm: 45 mg/dL (ref 26–217)

## 2020-11-15 LAB — PROTEIN ELECTROPHORESIS, SERUM, WITH REFLEX
A/G Ratio: 1 (ref 0.7–1.7)
Albumin ELP: 3.4 g/dL (ref 2.9–4.4)
Alpha-1-Globulin: 0.3 g/dL (ref 0.0–0.4)
Alpha-2-Globulin: 0.8 g/dL (ref 0.4–1.0)
Beta Globulin: 1 g/dL (ref 0.7–1.3)
Gamma Globulin: 1.5 g/dL (ref 0.4–1.8)
Globulin, Total: 3.5 g/dL (ref 2.2–3.9)
M-Spike, %: 0.8 g/dL — ABNORMAL HIGH
SPEP Interpretation: 0
Total Protein ELP: 6.9 g/dL (ref 6.0–8.5)

## 2020-11-20 MED FILL — NINLARO 3 MG CAPS: 3 | 28 days supply | Qty: 3 | Fill #4

## 2020-12-10 ENCOUNTER — Inpatient Hospital Stay (HOSPITAL_BASED_OUTPATIENT_CLINIC_OR_DEPARTMENT_OTHER): Payer: Medicare PPO | Admitting: Hematology & Oncology

## 2020-12-10 ENCOUNTER — Encounter: Payer: Self-pay | Admitting: Hematology & Oncology

## 2020-12-10 ENCOUNTER — Other Ambulatory Visit: Payer: Self-pay

## 2020-12-10 ENCOUNTER — Inpatient Hospital Stay: Payer: Medicare PPO | Attending: Hematology & Oncology

## 2020-12-10 ENCOUNTER — Inpatient Hospital Stay: Payer: Medicare PPO

## 2020-12-10 VITALS — BP 159/69 | HR 62 | Temp 98.7°F | Resp 18 | Wt 134.5 lb

## 2020-12-10 DIAGNOSIS — Z79899 Other long term (current) drug therapy: Secondary | ICD-10-CM | POA: Insufficient documentation

## 2020-12-10 DIAGNOSIS — Z95828 Presence of other vascular implants and grafts: Secondary | ICD-10-CM

## 2020-12-10 DIAGNOSIS — C9002 Multiple myeloma in relapse: Secondary | ICD-10-CM

## 2020-12-10 DIAGNOSIS — C9000 Multiple myeloma not having achieved remission: Secondary | ICD-10-CM

## 2020-12-10 DIAGNOSIS — C50911 Malignant neoplasm of unspecified site of right female breast: Secondary | ICD-10-CM

## 2020-12-10 LAB — CMP (CANCER CENTER ONLY)
ALT: 16 U/L (ref 0–44)
AST: 23 U/L (ref 15–41)
Albumin: 4 g/dL (ref 3.5–5.0)
Alkaline Phosphatase: 59 U/L (ref 38–126)
Anion gap: 8 (ref 5–15)
BUN: 29 mg/dL — ABNORMAL HIGH (ref 8–23)
CO2: 25 mmol/L (ref 22–32)
Calcium: 10.4 mg/dL — ABNORMAL HIGH (ref 8.9–10.3)
Chloride: 103 mmol/L (ref 98–111)
Creatinine: 1.2 mg/dL — ABNORMAL HIGH (ref 0.44–1.00)
GFR, Estimated: 44 mL/min — ABNORMAL LOW (ref >60)
Glucose, Bld: 93 mg/dL (ref 70–99)
Potassium: 4.6 mmol/L (ref 3.5–5.1)
Sodium: 136 mmol/L (ref 135–145)
Total Bilirubin: 0.6 mg/dL (ref 0.3–1.2)
Total Protein: 7.1 g/dL (ref 6.5–8.1)

## 2020-12-10 LAB — CBC WITH DIFFERENTIAL (CANCER CENTER ONLY)
Abs Immature Granulocytes: 0.02 10*3/uL (ref 0.00–0.07)
Basophils Absolute: 0 10*3/uL (ref 0.0–0.1)
Basophils Relative: 1 %
Eosinophils Absolute: 0 10*3/uL (ref 0.0–0.5)
Eosinophils Relative: 0 %
HCT: 35.1 % — ABNORMAL LOW (ref 36.0–46.0)
Hemoglobin: 11.6 g/dL — ABNORMAL LOW (ref 12.0–15.0)
Immature Granulocytes: 0 %
Lymphocytes Relative: 16 %
Lymphs Abs: 0.9 10*3/uL (ref 0.7–4.0)
MCH: 31.8 pg (ref 26.0–34.0)
MCHC: 33 g/dL (ref 30.0–36.0)
MCV: 96.2 fL (ref 80.0–100.0)
Monocytes Absolute: 0.6 10*3/uL (ref 0.1–1.0)
Monocytes Relative: 12 %
Neutro Abs: 3.8 10*3/uL (ref 1.7–7.7)
Neutrophils Relative %: 71 %
Platelet Count: 152 10*3/uL (ref 150–400)
RBC: 3.65 MIL/uL — ABNORMAL LOW (ref 3.87–5.11)
RDW: 15 % (ref 11.5–15.5)
WBC Count: 5.3 10*3/uL (ref 4.0–10.5)
nRBC: 0 % (ref 0.0–0.2)

## 2020-12-10 LAB — LACTATE DEHYDROGENASE: LDH: 126 U/L (ref 98–192)

## 2020-12-10 MED ORDER — HEPARIN SOD (PORK) LOCK FLUSH 100 UNIT/ML IV SOLN
250.0000 [IU] | Freq: Once | INTRAVENOUS | Status: DC | PRN
  Filled 2020-12-10: qty 5

## 2020-12-10 MED ORDER — ZOLEDRONIC ACID 4 MG/100ML IV SOLN: 4.0000 mg | Freq: Once | INTRAVENOUS | Status: DC

## 2020-12-10 MED ORDER — HEPARIN SOD (PORK) LOCK FLUSH 100 UNIT/ML IV SOLN
500.0000 [IU] | Freq: Once | INTRAVENOUS | Status: AC
  Administered 2020-12-10: 500 [IU] via INTRAVENOUS
  Filled 2020-12-10: qty 5

## 2020-12-10 MED ORDER — ALTEPLASE 2 MG IJ SOLR
2.0000 mg | Freq: Once | INTRAMUSCULAR | Status: DC | PRN
  Filled 2020-12-10: qty 2

## 2020-12-10 MED ORDER — SODIUM CHLORIDE 0.9% FLUSH
10.0000 mL | Freq: Once | INTRAVENOUS | Status: AC
  Administered 2020-12-10: 10 mL via INTRAVENOUS
  Filled 2020-12-10: qty 10

## 2020-12-10 MED ORDER — ZOLEDRONIC ACID 4 MG/5ML IV CONC
3.0000 mg | Freq: Once | INTRAVENOUS | Status: AC
  Administered 2020-12-10: 3 mg via INTRAVENOUS
  Filled 2020-12-10: qty 3.75

## 2020-12-10 MED ORDER — SODIUM CHLORIDE 0.9 % IV SOLN
Freq: Once | INTRAVENOUS | Status: AC
  Filled 2020-12-10: qty 250

## 2020-12-10 MED ORDER — ZOLEDRONIC ACID 4 MG/100ML IV SOLN
Freq: Once | INTRAVENOUS | Status: DC
  Filled 2020-12-10: qty 75

## 2020-12-10 MED ORDER — ZOLEDRONIC ACID 4 MG/100ML IV SOLN
4.0000 mg | Freq: Once | INTRAVENOUS | Status: DC
  Filled 2020-12-10: qty 100

## 2020-12-10 MED ORDER — SODIUM CHLORIDE 0.9% FLUSH
3.0000 mL | Freq: Once | INTRAVENOUS | Status: DC | PRN
  Filled 2020-12-10: qty 10

## 2020-12-10 NOTE — Patient Instructions (Signed)
Zoledronic Acid Injection (Hypercalcemia, Oncology) What is this medicine? ZOLEDRONIC ACID (ZOE le dron ik AS id) slows calcium loss from bones. It high calcium levels in the blood from some kinds of cancer. It may be used in other people at risk for bone loss. This medicine may be used for other purposes; ask your health care provider or pharmacist if you have questions. COMMON BRAND NAME(S): Zometa What should I tell my health care provider before I take this medicine? They need to know if you have any of these conditions:  cancer  dehydration  dental disease  kidney disease  liver disease  low levels of calcium in the blood  lung or breathing disease (asthma)  receiving steroids like dexamethasone or prednisone  an unusual or allergic reaction to zoledronic acid, other medicines, foods, dyes, or preservatives  pregnant or trying to get pregnant  breast-feeding How should I use this medicine? This drug is injected into a vein. It is given by a health care provider in a hospital or clinic setting. Talk to your health care provider about the use of this drug in children. Special care may be needed. Overdosage: If you think you have taken too much of this medicine contact a poison control center or emergency room at once. NOTE: This medicine is only for you. Do not share this medicine with others. What if I miss a dose? Keep appointments for follow-up doses. It is important not to miss your dose. Call your health care provider if you are unable to keep an appointment. What may interact with this medicine?  certain antibiotics given by injection  NSAIDs, medicines for pain and inflammation, like ibuprofen or naproxen  some diuretics like bumetanide, furosemide  teriparatide  thalidomide This list may not describe all possible interactions. Give your health care provider a list of all the medicines, herbs, non-prescription drugs, or dietary supplements you use. Also tell  them if you smoke, drink alcohol, or use illegal drugs. Some items may interact with your medicine. What should I watch for while using this medicine? Visit your health care provider for regular checks on your progress. It may be some time before you see the benefit from this drug. Some people who take this drug have severe bone, joint, or muscle pain. This drug may also increase your risk for jaw problems or a broken thigh bone. Tell your health care provider right away if you have severe pain in your jaw, bones, joints, or muscles. Tell you health care provider if you have any pain that does not go away or that gets worse. Tell your dentist and dental surgeon that you are taking this drug. You should not have major dental surgery while on this drug. See your dentist to have a dental exam and fix any dental problems before starting this drug. Take good care of your teeth while on this drug. Make sure you see your dentist for regular follow-up appointments. You should make sure you get enough calcium and vitamin D while you are taking this drug. Discuss the foods you eat and the vitamins you take with your health care provider. Check with your health care provider if you have severe diarrhea, nausea, and vomiting, or if you sweat a lot. The loss of too much body fluid may make it dangerous for you to take this drug. You may need blood work done while you are taking this drug. Do not become pregnant while taking this drug. Women should inform their health care provider   if they wish to become pregnant or think they might be pregnant. There is potential for serious harm to an unborn child. Talk to your health care provider for more information. What side effects may I notice from receiving this medicine? Side effects that you should report to your doctor or health care provider as soon as possible:  allergic reactions (skin rash, itching or hives; swelling of the face, lips, or tongue)  bone  pain  infection (fever, chills, cough, sore throat, pain or trouble passing urine)  jaw pain, especially after dental work  joint pain  kidney injury (trouble passing urine or change in the amount of urine)  low blood pressure (dizziness; feeling faint or lightheaded, falls; unusually weak or tired)  low calcium levels (fast heartbeat; muscle cramps or pain; pain, tingling, or numbness in the hands or feet; seizures)  low magnesium levels (fast, irregular heartbeat; muscle cramp or pain; muscle weakness; tremors; seizures)  low red blood cell counts (trouble breathing; feeling faint; lightheaded, falls; unusually weak or tired)  muscle pain  redness, blistering, peeling, or loosening of the skin, including inside the mouth  severe diarrhea  swelling of the ankles, feet, hands  trouble breathing Side effects that usually do not require medical attention (report to your doctor or health care provider if they continue or are bothersome):  anxious  constipation  coughing  depressed mood  eye irritation, itching, or pain  fever  general ill feeling or flu-like symptoms  nausea  pain, redness, or irritation at site where injected  trouble sleeping This list may not describe all possible side effects. Call your doctor for medical advice about side effects. You may report side effects to FDA at 1-800-FDA-1088. Where should I keep my medicine? This drug is given in a hospital or clinic. It will not be stored at home. NOTE: This sheet is a summary. It may not cover all possible information. If you have questions about this medicine, talk to your doctor, pharmacist, or health care provider.  2021 Elsevier/Gold Standard (2019-07-20 09:13:00)  

## 2020-12-10 NOTE — Patient Instructions (Signed)
Tunneled Central Venous Catheter Flushing Guide  It is important to flush your tunneled central venous catheter each time you use it, both before and after you use it. Flushing your catheter will help prevent it from clogging. What are the risks? Risks may include:  Infection.  Air getting into the catheter and bloodstream. Supplies needed:  A clean pair of gloves.  A disinfecting wipe. Use an alcohol wipe, chlorhexidine wipe, or iodine wipe as told by your health care provider.  A 10 mL syringe that has been prefilled with saline solution.  An empty 10 mL syringe, if a substance called heparin was injected into your catheter. How to flush your catheter When you flush your catheter, make sure you follow any specific instructions from your health care provider or the manufacturer. These are general guidelines. Flushing your catheter before use If there is heparin in your catheter: 1. Wash your hands with soap and water. 2. Put on gloves. 3. Scrub the injection cap for a minimum of 15 seconds with a disinfecting wipe. 4. Unclamp the catheter. 5. Attach the empty syringe to the injection cap. 6. Pull the syringe plunger back and withdraw 10 mL of blood. 7. Place the syringe into an appropriate waste container. 8. Scrub the injection cap for 15 seconds with a disinfecting wipe. 9. Attach the prefilled syringe to the injection cap. 10. Flush the catheter by pushing the plunger forward until all the liquid from the syringe is in the catheter. 11. Remove the syringe from the injection cap. 12. Clamp the catheter. If there is no heparin in your catheter: 1. Wash your hands with soap and water. 2. Put on gloves. 3. Scrub the injection cap for 15 seconds with a disinfecting wipe. 4. Unclamp the catheter. 5. Attach the prefilled syringe to the injection cap. 6. Flush the catheter by pushing the plunger forward until 5 mL of the liquid from the syringe is in the catheter. 7. Pull back on  the syringe until you see blood in the catheter. 8. If you have been asked to collect any blood, follow your health care provider's instructions. Otherwise, flush the catheter with the rest of the solution from the syringe. 9. Remove the syringe from the injection cap. 10. Clamp the catheter.   Flushing your catheter after use 1. Wash your hands with soap and water. 2. Put on gloves. 3. Scrub the injection cap for 15 seconds with a disinfecting wipe. 4. Unclamp the catheter. 5. Attach the prefilled syringe to the injection cap. 6. Flush the catheter by pushing the plunger forward until all of the liquid from the syringe is in the catheter. 7. Remove the syringe from the injection cap. 8. Clamp the catheter. Problems and solutions  If blood cannot be completely cleared from the injection cap, you may need to have the injection cap replaced.  If the catheter is difficult to flush, use the pulsing method. The pulsing method involves pushing only a few milliliters of solution into the catheter at a time and pausing between pushes.  If you do not see blood in the catheter when you pull back on the syringe, change your body position, such as by raising your arms above your head. Take a deep breath and cough. Then, pull back on the syringe. If you still do not see blood, flush the catheter with a small amount of solution. Then, change positions again and take a breath or cough. Pull back on the syringe again. If you still do not   see blood, finish flushing the catheter and contact your health care provider. Do not use your catheter until your health care provider says it is okay. General tips  Have someone help you flush your catheter, if possible.  Do not force fluid through your catheter.  Do not use a syringe that is larger or smaller than 10 mL. Using a smaller syringe can make the catheter burst.  Do not use your catheter without flushing it first if it has heparin in it. Contact a health  care provider if:  You cannot see any blood in the catheter when you flush it before using it.  Your catheter is difficult to flush. Get help right away if:  You cannot flush the catheter.  The catheter leaks when you flush it or when there is fluid in it.  There are cracks or breaks in the catheter. Summary  It is important to flush your tunneled central venous catheter each time you use it, both before and after you use it.  Scrub the injection cap for 15 seconds with a disinfecting wipe before and after you flush it.  When you flush your catheter, make sure you follow any specific instructions from your health care provider or the manufacturer.  Get help right away if you cannot flush the catheter. This information is not intended to replace advice given to you by your health care provider. Make sure you discuss any questions you have with your health care provider. Document Revised: 12/14/2019 Document Reviewed: 12/21/2018 Elsevier Patient Education  2021 Elsevier Inc.  

## 2020-12-10 NOTE — Progress Notes (Signed)
Hematology and Oncology Follow Up Visit  Jacqueline Rivas [id number] Jan 13, 1932 87 y.o. 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  Jacqueline Rivas is back for a follow-up.  She is doing pretty well.  She does have some swelling in the legs.  She is on a diuretic.  I think she sees a doctor down in [place] to help out.  Of note, her last Kappa light chain was up a little bit.  He was 362 mg/L.  This was up from 322 mg/L.  She is doing okay on the Ninlaro.  She says it causes some constipation.  She has had no fever.  She has had no cough or shortness of breath.  There has been no bleeding.  Her last CA 27.29 which was back in November was 16.  She is eating okay.  She is having no nausea or vomiting.  I just wish she would exercise a little bit more.  She just is not that interested in exercising.  I can sort of understand this given that her age is 87 years old.    Overall, I would say performance status is ECOG 2.    Medications:  Current Outpatient Medications:  .  calcium carbonate (OS-CAL) 600 MG TABS tablet, Take 1,200 mg by mouth daily with breakfast., Disp: , Rfl:  .  Cholecalciferol (VITAMIN D3) 2000 units TABS, daily., Disp: , Rfl:  .  ixazomib citrate (NINLARO) 3 MG capsule, Take 1 capsule (3 mg) by mouth weekly, 3 weeks on, 1 week off, repeat every 4 weeks. Take on an empty stomach 1hr before or 2hr after  meals., Disp: 3 capsule, Rfl: 5 .  lisinopril-hydrochlorothiazide (ZESTORETIC) 20-12.5 MG tablet, Take 1 tablet by mouth daily., Disp: , Rfl:  .  magnesium 30 MG tablet, Take 30 mg by mouth every morning., Disp: , Rfl:  .  Potassium 99 MG TABS, , Disp: , Rfl:  .  Zinc Sulfate (ZINC 15 PO), Take 1 tablet by mouth daily., Disp: , Rfl:  .  zolendronic acid (ZOMETA) 4 MG/5ML injection, Inject into the vein., Disp: , Rfl:  .  aspirin EC 81 MG tablet, Take 162 mg by mouth daily. (Patient not taking: No sig reported), Disp: , Rfl:  .  Coenzyme Q10 (CO Q 10) 100 MG CAPS, Take 100 mg by mouth every morning.  (Patient not taking: No sig reported), Disp: , Rfl:  No current facility-administered medications for this visit.  Facility-Administered Medications Ordered in Other Visits:  .  0.9 %  sodium chloride infusion, , Intravenous, Once, Ennever, Peter R, MD .  alteplase (CATHFLO ACTIVASE) injection 2 mg, 2 mg, Intracatheter, Once PRN, [name], MD .  heparin lock flush 100 unit/mL, 250 Units, Intracatheter, Once PRN, Burney Gauze  R, MD .  sodium chloride 0.9 % with Zoledronic Acid (ZOMETA) 3 mg, , Intravenous, Once, Ennever, Peter R, MD .  sodium chloride flush (NS) 0.9 % injection 10 mL, 10 mL, Intravenous, PRN, [place], Holli Humbles, NP, 10 mL at 05/11/18 1331 .  sodium chloride flush (NS) 0.9 % injection 3 mL, 3 mL, Intracatheter, Once PRN, Ennever, Rudell Cobb, MD  Allergies: No Known Allergies  Past Medical History, Surgical history, Social history, and Family History were reviewed and updated.  Review of Systems: Review of Systems  Constitutional: Negative.   HENT: Negative.   Eyes: Negative.   Respiratory: Negative.   Cardiovascular: Positive for leg swelling.  Gastrointestinal: Negative.   Genitourinary: Negative.   Musculoskeletal: Positive for joint pain and myalgias.  Skin: Negative.   Endo/Heme/Allergies: Negative.   Psychiatric/Behavioral: Negative.      Physical Exam:   weight is 134 lb 8 oz (61 kg). Her oral temperature is 98.7 F (37.1 C). Her blood pressure is 159/69 (abnormal) and her pulse is 62. Her respiration is 18 and oxygen saturation is 100%.   Physical Exam Vitals reviewed.  HENT:     Head: Normocephalic and atraumatic.  Eyes:     Pupils: Pupils are equal, round, and reactive to light.  Cardiovascular:     Rate and Rhythm: Normal rate and regular rhythm.     Heart sounds: Normal heart sounds.  Pulmonary:     Effort: Pulmonary effort is normal.     Breath sounds: Normal breath sounds.  Abdominal:     General: Bowel sounds are normal.     Palpations: Abdomen is soft.  Musculoskeletal:        General: No tenderness or deformity. Normal range of motion.     Cervical back: Normal range of motion.  Lymphadenopathy:     Cervical: No cervical adenopathy.  Skin:    General: Skin is warm and dry.     Findings: No erythema or rash.  Neurological:     Mental Status: She is alert and oriented to person, place, and time.  Psychiatric:        Behavior: Behavior normal.        Thought Content: Thought content normal.        Judgment: Judgment normal.      Lab Results  Component Value Date   WBC 5.3 12/10/2020   HGB 11.6 (L) 12/10/2020   HCT 35.1 (L) 12/10/2020   MCV 96.2 12/10/2020   PLT 152 12/10/2020     Chemistry      Component Value Date/Time   NA 136 12/10/2020 1039   NA 143 09/23/2017 1019   K 4.6 12/10/2020 1039   K 4.0 09/23/2017 1019   CL 103 12/10/2020 1039   CL 104 09/23/2017 1019   CO2 25 12/10/2020 1039   CO2 26 09/23/2017 1019   BUN 29 (H) 12/10/2020 1039   BUN 16 09/23/2017 1019   CREATININE 1.20 (H) 12/10/2020 1039   CREATININE 1.4 (H) 09/23/2017 1019      Component Value Date/Time   CALCIUM 10.4 (H) 12/10/2020 1039   CALCIUM 9.7 09/23/2017 1019   CALCIUM 11.1 (H) 11/13/2012 1526   ALKPHOS 59 12/10/2020 1039   ALKPHOS 61 09/23/2017 1019   AST 23 12/10/2020 1039   ALT 16 12/10/2020 1039   ALT 15  09/23/2017 1019   BILITOT 0.6 12/10/2020 1039     Impression and Plan: [name] is 87 year old white female. She has both IgA kappa myeloma and  stage II breast cancer.   She has a relapse of her IgA kappa myeloma.  The majority the relapses with her Kappa light chain.  Again, we will have to see how the Kappa light chains look.  I really want to keep her on the [facility] for right now.  If we find that the Kappa light chain is going up, we will have to add pomalidomide.  I will plan to see her back in another month.     [name], MD 2/22/202212:01 PM    ADDENDUM: Unfortunately, her light chains are going up quickly.  Her kappa light chain is now 550 mg/L.  As such, going to have to make any changes with her protocol.  She probably would benefit most from daratumumab but this is a weekly and this would be a real inconvenience for her to have to come up here weekly.  The other option would be for her to get treated down in [place].  I am not sure that would suit her.  We will try to get her on pomalidomide.  I think this would be reasonable.  I do not see a reason why she cannot be on pomalidomide.  I would try her on 3 mg daily dose.  I think she would be able to handle this.  I left a message for her son about all this.  We will send the pomalidomide to our pharmacist.  I told him about the side effects.  If he has any questions, he can give [place] a call.  We will still plan to get her back in a month.  Lattie Haw, MD

## 2020-12-11 LAB — IGG, IGA, IGM
IgA: 1448 mg/dL — ABNORMAL HIGH (ref 64–422)
IgG (Immunoglobin G), Serum: 536 mg/dL — ABNORMAL LOW (ref 586–1602)
IgM (Immunoglobulin M), Srm: 45 mg/dL (ref 26–217)

## 2020-12-11 LAB — KAPPA/LAMBDA LIGHT CHAINS
Kappa free light chain: 547.4 mg/L — ABNORMAL HIGH (ref 3.3–19.4)
Kappa, lambda light chain ratio: 54.2 — ABNORMAL HIGH (ref 0.26–1.65)
Lambda free light chains: 10.1 mg/L (ref 5.7–26.3)

## 2020-12-13 ENCOUNTER — Other Ambulatory Visit: Payer: Self-pay | Admitting: *Deleted

## 2020-12-13 LAB — PROTEIN ELECTROPHORESIS, SERUM, WITH REFLEX
A/G Ratio: 1 (ref 0.7–1.7)
Albumin ELP: 3.4 g/dL (ref 2.9–4.4)
Alpha-1-Globulin: 0.3 g/dL (ref 0.0–0.4)
Alpha-2-Globulin: 0.8 g/dL (ref 0.4–1.0)
Beta Globulin: 0.9 g/dL (ref 0.7–1.3)
Gamma Globulin: 1.5 g/dL (ref 0.4–1.8)
Globulin, Total: 3.5 g/dL (ref 2.2–3.9)
M-Spike, %: 0.9 g/dL — ABNORMAL HIGH
SPEP Interpretation: 0
Total Protein ELP: 6.9 g/dL (ref 6.0–8.5)

## 2020-12-13 LAB — IMMUNOFIXATION REFLEX, SERUM
IgA: 1589 mg/dL — ABNORMAL HIGH (ref 64–422)
IgG (Immunoglobin G), Serum: 536 mg/dL — ABNORMAL LOW (ref 586–1602)
IgM (Immunoglobulin M), Srm: 50 mg/dL (ref 26–217)

## 2020-12-13 MED ORDER — POMALIDOMIDE 3 MG PO CAPS: 3.0000 mg | ORAL_CAPSULE | Freq: Every day | ORAL | 0 refills | Status: DC

## 2020-12-16 ENCOUNTER — Telehealth: Payer: Self-pay | Admitting: Pharmacy Technician

## 2020-12-16 ENCOUNTER — Telehealth: Payer: Self-pay | Admitting: Pharmacist

## 2020-12-16 DIAGNOSIS — C9000 Multiple myeloma not having achieved remission: Secondary | ICD-10-CM

## 2020-12-16 NOTE — Telephone Encounter (Signed)
Oral Oncology Pharmacist Encounter  Received new prescription for Pomalyst (pomalidomide) for the treatment of relapsed mulitple myeloma in conjunction with ixazomib, planned duration until disease progression or unacceptable drug toxicity.  CMP from 12/10/20 assessed, no relevant lab abnormalities. Prescription dose and frequency assessed.   Current medication list in Epic reviewed, no DDIs with pomalidomide identified.  Evaluated chart and no patient barriers to medication adherence identified. Of note, patient has to currently pay a high out of pocket for her ixazomib because she does not qualify for assistance due to her income. It is likely that she was have a high copay for the pomalidomide too.  Prescription has been e-scribed to the [facility] for benefits analysis and approval.  Oral Oncology Clinic will continue to follow for insurance authorization, copayment issues, initial counseling and start date.  Darl Pikes, PharmD, BCPS, BCOP, CPP Hematology/Oncology Clinical Pharmacist Practitioner [facility]/HP/AP [facility] [phone]  12/16/2020 2:22 PM

## 2020-12-16 NOTE — Telephone Encounter (Signed)
Oral Oncology Patient Advocate Encounter  Received notification from [facility] that prior authorization for Pomalyst is required.  PA submitted on CoverMyMeds Key [id number]  Status is pending  Oral Oncology Clinic will continue to follow.  [place] Patient [place] Phone [phone] Fax [phone] 12/16/2020 3:30 PM

## 2020-12-16 NOTE — Telephone Encounter (Signed)
Oral Oncology Patient Advocate Encounter  Prior Authorization for Pomalyst has been approved.    PA# [id number] Effective dates: 12/16/20 through 06/14/21  Patients co-pay is $1065.59  Oral Oncology Clinic will continue to follow.   [place] Patient [place] Phone [phone] Fax [phone] 12/16/2020 3:54 PM

## 2020-12-18 NOTE — Telephone Encounter (Signed)
Pomalyst must be filled at [facility] per insurance.

## 2020-12-25 MED FILL — NINLARO 3 MG CAPS: 3 | 28 days supply | Qty: 3 | Fill #5

## 2020-12-30 MED ORDER — POMALIDOMIDE 3 MG PO CAPS: 3.0000 mg | ORAL_CAPSULE | Freq: Every day | ORAL | 0 refills | Status: DC

## 2021-01-08 ENCOUNTER — Other Ambulatory Visit: Payer: Self-pay

## 2021-01-08 ENCOUNTER — Inpatient Hospital Stay: Payer: Medicare PPO

## 2021-01-08 ENCOUNTER — Inpatient Hospital Stay (HOSPITAL_BASED_OUTPATIENT_CLINIC_OR_DEPARTMENT_OTHER): Payer: Medicare PPO | Admitting: Hematology & Oncology

## 2021-01-08 ENCOUNTER — Inpatient Hospital Stay: Payer: Medicare PPO | Attending: Hematology & Oncology

## 2021-01-08 ENCOUNTER — Encounter: Payer: Self-pay | Admitting: Hematology & Oncology

## 2021-01-08 VITALS — BP 156/77 | HR 89 | Temp 98.7°F | Resp 18 | Wt 132.0 lb

## 2021-01-08 DIAGNOSIS — Z95828 Presence of other vascular implants and grafts: Secondary | ICD-10-CM

## 2021-01-08 DIAGNOSIS — Z853 Personal history of malignant neoplasm of breast: Secondary | ICD-10-CM | POA: Diagnosis not present

## 2021-01-08 DIAGNOSIS — C9002 Multiple myeloma in relapse: Secondary | ICD-10-CM | POA: Insufficient documentation

## 2021-01-08 DIAGNOSIS — D630 Anemia in neoplastic disease: Secondary | ICD-10-CM | POA: Insufficient documentation

## 2021-01-08 LAB — CBC WITH DIFFERENTIAL (CANCER CENTER ONLY)
Abs Immature Granulocytes: 0.02 10*3/uL (ref 0.00–0.07)
Basophils Absolute: 0 10*3/uL (ref 0.0–0.1)
Basophils Relative: 0 %
Eosinophils Absolute: 0 10*3/uL (ref 0.0–0.5)
Eosinophils Relative: 0 %
HCT: 35.1 % — ABNORMAL LOW (ref 36.0–46.0)
Hemoglobin: 11.6 g/dL — ABNORMAL LOW (ref 12.0–15.0)
Immature Granulocytes: 0 %
Lymphocytes Relative: 8 %
Lymphs Abs: 0.5 10*3/uL — ABNORMAL LOW (ref 0.7–4.0)
MCH: 32.1 pg (ref 26.0–34.0)
MCHC: 33 g/dL (ref 30.0–36.0)
MCV: 97.2 fL (ref 80.0–100.0)
Monocytes Absolute: 0.7 10*3/uL (ref 0.1–1.0)
Monocytes Relative: 12 %
Neutro Abs: 4.7 10*3/uL (ref 1.7–7.7)
Neutrophils Relative %: 80 %
Platelet Count: 163 10*3/uL (ref 150–400)
RBC: 3.61 MIL/uL — ABNORMAL LOW (ref 3.87–5.11)
RDW: 14.6 % (ref 11.5–15.5)
WBC Count: 6 10*3/uL (ref 4.0–10.5)
nRBC: 0 % (ref 0.0–0.2)

## 2021-01-08 LAB — CMP (CANCER CENTER ONLY)
ALT: 14 U/L (ref 0–44)
AST: 21 U/L (ref 15–41)
Albumin: 3.8 g/dL (ref 3.5–5.0)
Alkaline Phosphatase: 56 U/L (ref 38–126)
Anion gap: 10 (ref 5–15)
BUN: 23 mg/dL (ref 8–23)
CO2: 25 mmol/L (ref 22–32)
Calcium: 10 mg/dL (ref 8.9–10.3)
Chloride: 101 mmol/L (ref 98–111)
Creatinine: 1.1 mg/dL — ABNORMAL HIGH (ref 0.44–1.00)
GFR, Estimated: 49 mL/min — ABNORMAL LOW (ref >60)
Glucose, Bld: 87 mg/dL (ref 70–99)
Potassium: 4 mmol/L (ref 3.5–5.1)
Sodium: 136 mmol/L (ref 135–145)
Total Bilirubin: 0.6 mg/dL (ref 0.3–1.2)
Total Protein: 7.3 g/dL (ref 6.5–8.1)

## 2021-01-08 LAB — LACTATE DEHYDROGENASE: LDH: 125 U/L (ref 98–192)

## 2021-01-08 MED ORDER — SODIUM CHLORIDE 0.9% FLUSH
10.0000 mL | Freq: Once | INTRAVENOUS | Status: AC
  Administered 2021-01-08: 10 mL via INTRAVENOUS
  Filled 2021-01-08: qty 10

## 2021-01-08 MED ORDER — HEPARIN SOD (PORK) LOCK FLUSH 100 UNIT/ML IV SOLN
500.0000 [IU] | Freq: Once | INTRAVENOUS | Status: AC
  Administered 2021-01-08: 500 [IU] via INTRAVENOUS
  Filled 2021-01-08: qty 5

## 2021-01-08 NOTE — Progress Notes (Signed)
Hematology and Oncology Follow Up Visit  Jacqueline Rivas [id number] 1932/03/04 87 y.o. 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 is back for a follow-up.  Unfortunately, her myeloma has progressed.  Her light chain was up to 540 mg/L.  Because of this, I want her to add pomalidomide to the [facility].  Unfortunately, she is yet to do this.  She thought that she was only supposed to take the pomalidomide.  Even knowing this, she never read up the phone when her insurance company called.  I talked her about this today.  I told her that if we do not get the light chain level down, it will eventually cause her to have kidney failure.  Hopefully this will get her to take the pomalidomide.  Otherwise, she seems to be managing pretty well.  She is eating okay.  She is having no problems with cough or shortness of breath.  She does not have any issues with nausea or vomiting.  She has had no leg swelling.  She has had no rashes.  She has had no fever.  I would have to say that at the present time, her performance status is probably ECOG 2.   Medications:  Current Outpatient Medications:  .  aspirin EC 81 MG tablet, Take 162 mg by mouth daily. (Patient not taking: No sig reported), Disp: , Rfl:  .  calcium carbonate (OS-CAL) 600 MG TABS tablet, Take 1,200 mg by mouth daily with breakfast., Disp: , Rfl:  .  Cholecalciferol  (VITAMIN D3) 2000 units TABS, daily., Disp: , Rfl:  .  Coenzyme Q10 (CO Q 10) 100 MG CAPS, Take 100 mg by mouth every morning.  (Patient not taking: No sig reported), Disp: , Rfl:  .  ixazomib citrate (NINLARO) 3 MG capsule, Take 1 capsule (3 mg) by mouth weekly, 3 weeks on, 1 week off, repeat every 4 weeks. Take on an empty stomach 1hr before or 2hr after meals., Disp: 3 capsule, Rfl: 5 .  lisinopril-hydrochlorothiazide (ZESTORETIC) 20-12.5 MG tablet, Take 1 tablet by mouth daily., Disp: , Rfl:  .  magnesium 30 MG tablet, Take 30 mg by mouth every morning., Disp: , Rfl:  .  pomalidomide (POMALYST) 3 MG capsule, Take 1 capsule (3 mg total) by mouth daily. Take for 21 days, then hold for 7 days. Repeat every 28 days., Disp: 21 capsule, Rfl: 0 .  Potassium 99 MG TABS, , Disp: , Rfl:  .  Zinc Sulfate (ZINC 15 PO), Take 1 tablet by mouth daily., Disp: , Rfl:  .  zolendronic acid (ZOMETA) 4 MG/5ML injection, Inject into the vein., Disp: , Rfl:  No current facility-administered medications for this visit.  Facility-Administered Medications Ordered in Other Visits:  .  sodium  chloride flush (NS) 0.9 % injection 10 mL, 10 mL, Intravenous, PRN, [place], Sarah M, NP, 10 mL at 05/11/18 1331  Allergies: No Known Allergies  Past Medical History, Surgical history, Social history, and Family History were reviewed and updated.  Review of Systems: Review of Systems  Constitutional: Negative.   HENT: Negative.   Eyes: Negative.   Respiratory: Negative.   Cardiovascular: Positive for leg swelling.  Gastrointestinal: Negative.   Genitourinary: Negative.   Musculoskeletal: Positive for joint pain and myalgias.  Skin: Negative.   Endo/Heme/Allergies: Negative.   Psychiatric/Behavioral: Negative.      Physical Exam:  weight is 132 lb (59.9 kg). Her oral temperature is 98.7 F (37.1 C). Her blood pressure is 156/77 (abnormal) and her pulse is 89. Her respiration is 18 and oxygen saturation is 99%.    Physical Exam Vitals reviewed.  HENT:     Head: Normocephalic and atraumatic.  Eyes:     Pupils: Pupils are equal, round, and reactive to light.  Cardiovascular:     Rate and Rhythm: Normal rate and regular rhythm.     Heart sounds: Normal heart sounds.  Pulmonary:     Effort: Pulmonary effort is normal.     Breath sounds: Normal breath sounds.  Abdominal:     General: Bowel sounds are normal.     Palpations: Abdomen is soft.  Musculoskeletal:        General: No tenderness or deformity. Normal range of motion.     Cervical back: Normal range of motion.  Lymphadenopathy:     Cervical: No cervical adenopathy.  Skin:    General: Skin is warm and dry.     Findings: No erythema or rash.  Neurological:     Mental Status: She is alert and oriented to person, place, and time.  Psychiatric:        Behavior: Behavior normal.        Thought Content: Thought content normal.        Judgment: Judgment normal.      Lab Results  Component Value Date   WBC 6.0 01/08/2021   HGB 11.6 (L) 01/08/2021   HCT 35.1 (L) 01/08/2021   MCV 97.2 01/08/2021   PLT 163 01/08/2021     Chemistry      Component Value Date/Time   NA 136 01/08/2021 1251   NA 143 09/23/2017 1019   K 4.0 01/08/2021 1251   K 4.0 09/23/2017 1019   CL 101 01/08/2021 1251   CL 104 09/23/2017 1019   CO2 25 01/08/2021 1251   CO2 26 09/23/2017 1019   BUN 23 01/08/2021 1251   BUN 16 09/23/2017 1019   CREATININE 1.10 (H) 01/08/2021 1251   CREATININE 1.4 (H) 09/23/2017 1019      Component Value Date/Time   CALCIUM 10.0 01/08/2021 1251   CALCIUM 9.7 09/23/2017 1019   CALCIUM 11.1 (H) 11/13/2012 1526   ALKPHOS 56 01/08/2021 1251   ALKPHOS 61 09/23/2017 1019   AST 21 01/08/2021 1251   ALT 14 01/08/2021 1251   ALT 15 09/23/2017 1019   BILITOT 0.6 01/08/2021 1251     Impression and Plan: Jacqueline Rivas is 87 year old white female. She has both IgA kappa myeloma and stage II breast cancer.   She has a relapse of her  IgA kappa myeloma.  The majority the relapse is with her Kappa light chain.  We will hopefully see a response when the pomalidomide is added to the Ninlaro.  She promises that she will start  taking the pomalidomide.  He will be interesting to see what the Kappa light chain is right now since she has not been on the pomalidomide.  As always, we will plan to get her back in another month or so.  Ultimately, we may have to think about getting her down in [place] to be seen to try to make a little bit easier for her, if we have to do anything that is intravenous or subcutaneous.    [name], MD 3/23/20221:54 PM

## 2021-01-08 NOTE — Patient Instructions (Signed)
Tunneled Central Venous Catheter Flushing Guide  It is important to flush your tunneled central venous catheter each time you use it, both before and after you use it. Flushing your catheter will help prevent it from clogging. What are the risks? Risks may include:  Infection.  Air getting into the catheter and bloodstream. Supplies needed:  A clean pair of gloves.  A disinfecting wipe. Use an alcohol wipe, chlorhexidine wipe, or iodine wipe as told by your health care provider.  A 10 mL syringe that has been prefilled with saline solution.  An empty 10 mL syringe, if a substance called heparin was injected into your catheter. How to flush your catheter When you flush your catheter, make sure you follow any specific instructions from your health care provider or the manufacturer. These are general guidelines. Flushing your catheter before use If there is heparin in your catheter: 1. Wash your hands with soap and water. 2. Put on gloves. 3. Scrub the injection cap for a minimum of 15 seconds with a disinfecting wipe. 4. Unclamp the catheter. 5. Attach the empty syringe to the injection cap. 6. Pull the syringe plunger back and withdraw 10 mL of blood. 7. Place the syringe into an appropriate waste container. 8. Scrub the injection cap for 15 seconds with a disinfecting wipe. 9. Attach the prefilled syringe to the injection cap. 10. Flush the catheter by pushing the plunger forward until all the liquid from the syringe is in the catheter. 11. Remove the syringe from the injection cap. 12. Clamp the catheter. If there is no heparin in your catheter: 1. Wash your hands with soap and water. 2. Put on gloves. 3. Scrub the injection cap for 15 seconds with a disinfecting wipe. 4. Unclamp the catheter. 5. Attach the prefilled syringe to the injection cap. 6. Flush the catheter by pushing the plunger forward until 5 mL of the liquid from the syringe is in the catheter. 7. Pull back on  the syringe until you see blood in the catheter. 8. If you have been asked to collect any blood, follow your health care provider's instructions. Otherwise, flush the catheter with the rest of the solution from the syringe. 9. Remove the syringe from the injection cap. 10. Clamp the catheter.   Flushing your catheter after use 1. Wash your hands with soap and water. 2. Put on gloves. 3. Scrub the injection cap for 15 seconds with a disinfecting wipe. 4. Unclamp the catheter. 5. Attach the prefilled syringe to the injection cap. 6. Flush the catheter by pushing the plunger forward until all of the liquid from the syringe is in the catheter. 7. Remove the syringe from the injection cap. 8. Clamp the catheter. Problems and solutions  If blood cannot be completely cleared from the injection cap, you may need to have the injection cap replaced.  If the catheter is difficult to flush, use the pulsing method. The pulsing method involves pushing only a few milliliters of solution into the catheter at a time and pausing between pushes.  If you do not see blood in the catheter when you pull back on the syringe, change your body position, such as by raising your arms above your head. Take a deep breath and cough. Then, pull back on the syringe. If you still do not see blood, flush the catheter with a small amount of solution. Then, change positions again and take a breath or cough. Pull back on the syringe again. If you still do not   see blood, finish flushing the catheter and contact your health care provider. Do not use your catheter until your health care provider says it is okay. General tips  Have someone help you flush your catheter, if possible.  Do not force fluid through your catheter.  Do not use a syringe that is larger or smaller than 10 mL. Using a smaller syringe can make the catheter burst.  Do not use your catheter without flushing it first if it has heparin in it. Contact a health  care provider if:  You cannot see any blood in the catheter when you flush it before using it.  Your catheter is difficult to flush. Get help right away if:  You cannot flush the catheter.  The catheter leaks when you flush it or when there is fluid in it.  There are cracks or breaks in the catheter. Summary  It is important to flush your tunneled central venous catheter each time you use it, both before and after you use it.  Scrub the injection cap for 15 seconds with a disinfecting wipe before and after you flush it.  When you flush your catheter, make sure you follow any specific instructions from your health care provider or the manufacturer.  Get help right away if you cannot flush the catheter. This information is not intended to replace advice given to you by your health care provider. Make sure you discuss any questions you have with your health care provider. Document Revised: 12/14/2019 Document Reviewed: 12/21/2018 Elsevier Patient Education  2021 Elsevier Inc.  

## 2021-01-09 LAB — KAPPA/LAMBDA LIGHT CHAINS
Kappa free light chain: 544.4 mg/L — ABNORMAL HIGH (ref 3.3–19.4)
Kappa, lambda light chain ratio: 59.17 — ABNORMAL HIGH (ref 0.26–1.65)
Lambda free light chains: 9.2 mg/L (ref 5.7–26.3)

## 2021-01-09 LAB — IGG, IGA, IGM
IgA: 1695 mg/dL — ABNORMAL HIGH (ref 64–422)
IgG (Immunoglobin G), Serum: 518 mg/dL — ABNORMAL LOW (ref 586–1602)
IgM (Immunoglobulin M), Srm: 43 mg/dL (ref 26–217)

## 2021-01-09 LAB — CANCER ANTIGEN 27.29: CA 27.29: 20.2 U/mL (ref 0.0–38.6)

## 2021-01-10 LAB — PROTEIN ELECTROPHORESIS, SERUM, WITH REFLEX
A/G Ratio: 1 (ref 0.7–1.7)
Albumin ELP: 3.4 g/dL (ref 2.9–4.4)
Alpha-1-Globulin: 0.2 g/dL (ref 0.0–0.4)
Alpha-2-Globulin: 1 g/dL (ref 0.4–1.0)
Beta Globulin: 0.7 g/dL (ref 0.7–1.3)
Gamma Globulin: 1.5 g/dL (ref 0.4–1.8)
Globulin, Total: 3.5 g/dL (ref 2.2–3.9)
M-Spike, %: 1.1 g/dL — ABNORMAL HIGH
SPEP Interpretation: 0
Total Protein ELP: 6.9 g/dL (ref 6.0–8.5)

## 2021-01-10 LAB — IMMUNOFIXATION REFLEX, SERUM
IgA: 1759 mg/dL — ABNORMAL HIGH (ref 64–422)
IgG (Immunoglobin G), Serum: 530 mg/dL — ABNORMAL LOW (ref 586–1602)
IgM (Immunoglobulin M), Srm: 46 mg/dL (ref 26–217)

## 2021-01-14 ENCOUNTER — Other Ambulatory Visit (HOSPITAL_COMMUNITY): Payer: Self-pay

## 2021-01-23 ENCOUNTER — Other Ambulatory Visit: Payer: Self-pay | Admitting: *Deleted

## 2021-01-23 ENCOUNTER — Other Ambulatory Visit (HOSPITAL_COMMUNITY): Payer: Self-pay

## 2021-01-23 MED ORDER — IXAZOMIB CITRATE 3 MG PO CAPS
ORAL_CAPSULE | ORAL | 5 refills | Status: AC
  Filled 2021-01-23: qty 3, fill #0
  Filled 2021-01-28: qty 3, 28d supply, fill #0
  Filled 2021-03-11 (×2): qty 3, 28d supply, fill #1
  Filled 2021-04-07: qty 3, 28d supply, fill #2
  Filled 2021-09-04: qty 3, 28d supply, fill #3

## 2021-01-28 ENCOUNTER — Other Ambulatory Visit (HOSPITAL_COMMUNITY): Payer: Self-pay

## 2021-01-28 ENCOUNTER — Other Ambulatory Visit: Payer: Self-pay | Admitting: *Deleted

## 2021-01-28 DIAGNOSIS — C9000 Multiple myeloma not having achieved remission: Secondary | ICD-10-CM

## 2021-01-28 MED ORDER — POMALIDOMIDE 3 MG PO CAPS: 3.0000 mg | ORAL_CAPSULE | Freq: Every day | ORAL | 0 refills | Status: DC

## 2021-01-29 ENCOUNTER — Other Ambulatory Visit (HOSPITAL_COMMUNITY): Payer: Self-pay

## 2021-02-04 ENCOUNTER — Other Ambulatory Visit (HOSPITAL_COMMUNITY): Payer: Self-pay

## 2021-02-10 ENCOUNTER — Telehealth: Payer: Self-pay

## 2021-02-10 NOTE — Telephone Encounter (Signed)
Pt called in to r/s her appt to may as she has been out of some meds and req to get back on track with them and also her son/transport is out of state   Jacqueline Rivas

## 2021-02-12 ENCOUNTER — Ambulatory Visit: Payer: PRIVATE HEALTH INSURANCE | Admitting: Hematology & Oncology

## 2021-02-12 ENCOUNTER — Other Ambulatory Visit: Payer: PRIVATE HEALTH INSURANCE

## 2021-02-19 ENCOUNTER — Other Ambulatory Visit (HOSPITAL_COMMUNITY): Payer: Self-pay

## 2021-02-21 ENCOUNTER — Other Ambulatory Visit (HOSPITAL_COMMUNITY): Payer: Self-pay

## 2021-02-25 ENCOUNTER — Other Ambulatory Visit (HOSPITAL_COMMUNITY): Payer: Self-pay

## 2021-03-11 ENCOUNTER — Inpatient Hospital Stay (HOSPITAL_BASED_OUTPATIENT_CLINIC_OR_DEPARTMENT_OTHER): Payer: Medicare PPO | Admitting: Hematology & Oncology

## 2021-03-11 ENCOUNTER — Inpatient Hospital Stay: Payer: Medicare PPO

## 2021-03-11 ENCOUNTER — Inpatient Hospital Stay: Payer: Medicare PPO | Attending: Hematology & Oncology

## 2021-03-11 ENCOUNTER — Other Ambulatory Visit: Payer: Self-pay | Admitting: *Deleted

## 2021-03-11 ENCOUNTER — Encounter: Payer: Self-pay | Admitting: Hematology & Oncology

## 2021-03-11 ENCOUNTER — Other Ambulatory Visit (HOSPITAL_COMMUNITY): Payer: Self-pay

## 2021-03-11 ENCOUNTER — Other Ambulatory Visit: Payer: Self-pay

## 2021-03-11 VITALS — BP 171/71 | HR 82 | Temp 98.5°F | Resp 18 | Wt 135.0 lb

## 2021-03-11 DIAGNOSIS — C50911 Malignant neoplasm of unspecified site of right female breast: Secondary | ICD-10-CM

## 2021-03-11 DIAGNOSIS — C9002 Multiple myeloma in relapse: Secondary | ICD-10-CM | POA: Insufficient documentation

## 2021-03-11 DIAGNOSIS — C9000 Multiple myeloma not having achieved remission: Secondary | ICD-10-CM | POA: Diagnosis not present

## 2021-03-11 DIAGNOSIS — Z79899 Other long term (current) drug therapy: Secondary | ICD-10-CM | POA: Diagnosis not present

## 2021-03-11 LAB — CBC WITH DIFFERENTIAL (CANCER CENTER ONLY)
Abs Immature Granulocytes: 0.04 10*3/uL (ref 0.00–0.07)
Basophils Absolute: 0.1 10*3/uL (ref 0.0–0.1)
Basophils Relative: 2 %
Eosinophils Absolute: 0.1 10*3/uL (ref 0.0–0.5)
Eosinophils Relative: 1 %
HCT: 34.9 % — ABNORMAL LOW (ref 36.0–46.0)
Hemoglobin: 11.4 g/dL — ABNORMAL LOW (ref 12.0–15.0)
Immature Granulocytes: 1 %
Lymphocytes Relative: 11 %
Lymphs Abs: 0.5 10*3/uL — ABNORMAL LOW (ref 0.7–4.0)
MCH: 32.8 pg (ref 26.0–34.0)
MCHC: 32.7 g/dL (ref 30.0–36.0)
MCV: 100.3 fL — ABNORMAL HIGH (ref 80.0–100.0)
Monocytes Absolute: 0.7 10*3/uL (ref 0.1–1.0)
Monocytes Relative: 15 %
Neutro Abs: 3.1 10*3/uL (ref 1.7–7.7)
Neutrophils Relative %: 70 %
Platelet Count: 165 10*3/uL (ref 150–400)
RBC: 3.48 MIL/uL — ABNORMAL LOW (ref 3.87–5.11)
RDW: 15.3 % (ref 11.5–15.5)
WBC Count: 4.5 10*3/uL (ref 4.0–10.5)
nRBC: 0 % (ref 0.0–0.2)

## 2021-03-11 LAB — CMP (CANCER CENTER ONLY)
ALT: 14 U/L (ref 0–44)
AST: 19 U/L (ref 15–41)
Albumin: 3.8 g/dL (ref 3.5–5.0)
Alkaline Phosphatase: 54 U/L (ref 38–126)
Anion gap: 10 (ref 5–15)
BUN: 23 mg/dL (ref 8–23)
CO2: 26 mmol/L (ref 22–32)
Calcium: 10.1 mg/dL (ref 8.9–10.3)
Chloride: 104 mmol/L (ref 98–111)
Creatinine: 1.05 mg/dL — ABNORMAL HIGH (ref 0.44–1.00)
GFR, Estimated: 52 mL/min — ABNORMAL LOW (ref >60)
Glucose, Bld: 85 mg/dL (ref 70–99)
Potassium: 4.1 mmol/L (ref 3.5–5.1)
Sodium: 140 mmol/L (ref 135–145)
Total Bilirubin: 0.7 mg/dL (ref 0.3–1.2)
Total Protein: 6.9 g/dL (ref 6.5–8.1)

## 2021-03-11 LAB — LACTATE DEHYDROGENASE: LDH: 128 U/L (ref 98–192)

## 2021-03-11 MED ORDER — ZOLEDRONIC ACID 4 MG/5ML IV CONC
Freq: Once | INTRAVENOUS | Status: AC
  Filled 2021-03-11: qty 3.75

## 2021-03-11 MED ORDER — HEPARIN SOD (PORK) LOCK FLUSH 100 UNIT/ML IV SOLN
500.0000 [IU] | Freq: Once | INTRAVENOUS | Status: AC | PRN
  Administered 2021-03-11: 500 [IU]
  Filled 2021-03-11: qty 5

## 2021-03-11 MED ORDER — POMALIDOMIDE 3 MG PO CAPS: 3.0000 mg | ORAL_CAPSULE | Freq: Every day | ORAL | 0 refills | Status: DC

## 2021-03-11 MED ORDER — SODIUM CHLORIDE 0.9 % IV SOLN
Freq: Once | INTRAVENOUS | Status: AC
  Filled 2021-03-11: qty 250

## 2021-03-11 MED ORDER — SODIUM CHLORIDE 0.9% FLUSH
10.0000 mL | Freq: Once | INTRAVENOUS | Status: AC | PRN
  Administered 2021-03-11: 10 mL
  Filled 2021-03-11: qty 10

## 2021-03-11 NOTE — Patient Instructions (Signed)

## 2021-03-11 NOTE — Progress Notes (Signed)
Hematology and Oncology Follow Up Visit  Jacqueline Rivas [id number] 02/02/1932 87 y.o. 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 is back for a follow-up.  It has been couple months since we last saw her.  Weight try to get her started on pomalidomide along with the Ninlaro in April.  I think it took a while to get this.  I think she still has to pay quite a bit of money which I am not too happy about.  The last time that we saw her, her IgA level was 1760 mg/dL.  The Kappa light chain was 54.4 mg/dL.  We went ahead and added the pomalidomide.  She has some swelling in her legs.  She is on lisinopril along with hydrochlorothiazide.  We will have to watch this closely.  I will do she has any obvious congestive heart failure.  Her appetite seems to be doing pretty well.  She is having no nausea or vomiting.  She is having no diarrhea.  There is no fever.  She is having no cough or shortness of breath.  She says she is trying to walk a little bit more.  Overall, I would have to say that her performance status is probably ECOG 2.    Medications:  Current Outpatient Medications:  .  aspirin EC 81 MG tablet, Take 162 mg by mouth daily., Disp: , Rfl:  .  calcium carbonate (OS-CAL) 600 MG TABS tablet, Take 1,200 mg by mouth daily with breakfast., Disp: , Rfl:  .  Cholecalciferol (VITAMIN D3) 2000 units TABS,  daily., Disp: , Rfl:  .  Coenzyme Q10 (CO Q 10) 100 MG CAPS, Take 100 mg by mouth every morning., Disp: , Rfl:  .  ixazomib citrate (NINLARO) 3 MG capsule, TAKE 1 CAPSULE (3 MG) BY MOUTH WEEKLY, 3 WEEKS ON, 1 WEEK OFF, REPEAT EVERY 4 WEEKS. TAKE ON AN EMPTY STOMACH 1HR BEFORE OR 2HR AFTER MEALS (Patient taking differently: TAKE 1 CAPSULE (3 MG) BY MOUTH WEEKLY, 3 WEEKS ON, 1 WEEK OFF, REPEAT EVERY 4 WEEKS. TAKE ON AN EMPTY STOMACH 1HR BEFORE OR 2HR AFTER MEALS), Disp: 3 capsule, Rfl: 5 .  lisinopril-hydrochlorothiazide (ZESTORETIC) 20-12.5 MG tablet, Take 1 tablet by mouth daily., Disp: , Rfl:  .  magnesium 30 MG tablet, Take 30 mg by mouth every morning., Disp: , Rfl:  .  pomalidomide (POMALYST) 3 MG capsule, Take 1 capsule (3 mg total) by mouth daily. Take for 21 days, then hold for 7 days. Repeat every 28 days., Disp: 21 capsule, Rfl: 0 .  Potassium 99 MG TABS, , Disp: , Rfl:  .  Zinc Sulfate (ZINC 15 PO), Take 1 tablet by mouth daily., Disp: , Rfl:  .  zolendronic acid (ZOMETA) 4  MG/5ML injection, Inject into the vein., Disp: , Rfl:  No current facility-administered medications for this visit.  Facility-Administered Medications Ordered in Other Visits:  .  sodium chloride flush (NS) 0.9 % injection 10 mL, 10 mL, Intravenous, PRN, [place], Sarah M, NP, 10 mL at 05/11/18 1331  Allergies: No Known Allergies  Past Medical History, Surgical history, Social history, and Family History were reviewed and updated.  Review of Systems: Review of Systems  Constitutional: Negative.   HENT: Negative.   Eyes: Negative.   Respiratory: Negative.   Cardiovascular: Positive for leg swelling.  Gastrointestinal: Negative.   Genitourinary: Negative.   Musculoskeletal: Positive for joint pain and myalgias.  Skin: Negative.   Endo/Heme/Allergies: Negative.   Psychiatric/Behavioral: Negative.      Physical Exam:  weight is 135 lb (61.2 kg). Her oral temperature is 98.5 F (36.9 C). Her blood  pressure is 171/71 (abnormal) and her pulse is 82. Her respiration is 18 and oxygen saturation is 98%.   Physical Exam Vitals reviewed.  HENT:     Head: Normocephalic and atraumatic.  Eyes:     Pupils: Pupils are equal, round, and reactive to light.  Cardiovascular:     Rate and Rhythm: Normal rate and regular rhythm.     Heart sounds: Normal heart sounds.  Pulmonary:     Effort: Pulmonary effort is normal.     Breath sounds: Normal breath sounds.  Abdominal:     General: Bowel sounds are normal.     Palpations: Abdomen is soft.  Musculoskeletal:        General: No tenderness or deformity. Normal range of motion.     Cervical back: Normal range of motion.  Lymphadenopathy:     Cervical: No cervical adenopathy.  Skin:    General: Skin is warm and dry.     Findings: No erythema or rash.  Neurological:     Mental Status: She is alert and oriented to person, place, and time.  Psychiatric:        Behavior: Behavior normal.        Thought Content: Thought content normal.        Judgment: Judgment normal.      Lab Results  Component Value Date   WBC 4.5 03/11/2021   HGB 11.4 (L) 03/11/2021   HCT 34.9 (L) 03/11/2021   MCV 100.3 (H) 03/11/2021   PLT 165 03/11/2021     Chemistry      Component Value Date/Time   NA 140 03/11/2021 0935   NA 143 09/23/2017 1019   K 4.1 03/11/2021 0935   K 4.0 09/23/2017 1019   CL 104 03/11/2021 0935   CL 104 09/23/2017 1019   CO2 26 03/11/2021 0935   CO2 26 09/23/2017 1019   BUN 23 03/11/2021 0935   BUN 16 09/23/2017 1019   CREATININE 1.05 (H) 03/11/2021 0935   CREATININE 1.4 (H) 09/23/2017 1019      Component Value Date/Time   CALCIUM 10.1 03/11/2021 0935   CALCIUM 9.7 09/23/2017 1019   CALCIUM 11.1 (H) 11/13/2012 1526   ALKPHOS 54 03/11/2021 0935   ALKPHOS 61 09/23/2017 1019   AST 19 03/11/2021 0935   ALT 14 03/11/2021 0935   ALT 15 09/23/2017 1019   BILITOT 0.7 03/11/2021 0935     Impression and Plan: Jacqueline Rivas is  87 year old white female. She has both IgA kappa myeloma and stage II breast cancer.   She has a relapse of her IgA kappa myeloma.  The majority the  relapse is with her Kappa light chain.  We will hopefully see a response when the pomalidomide is added to the Ninlaro.  Again, I am unsure how much pomalidomide she really has been on as of yet.  We will go ahead and move ahead with the Zometa today.  Her next Zometa dose will be in August.  I am glad that she is managing down in [place].  Her family is down there.  She does enjoy being close to them.  Does make life a little bit easier for her.  As always, we will plan to get her back to see [place] in another month or so.   [name], MD 5/24/202210:59 AM

## 2021-03-11 NOTE — Patient Instructions (Signed)
Zoledronic Acid Injection (Hypercalcemia, Oncology) What is this medicine? ZOLEDRONIC ACID (ZOE le dron ik AS id) slows calcium loss from bones. It high calcium levels in the blood from some kinds of cancer. It may be used in other people at risk for bone loss. This medicine may be used for other purposes; ask your health care provider or pharmacist if you have questions. COMMON BRAND NAME(S): Zometa What should I tell my health care provider before I take this medicine? They need to know if you have any of these conditions:  cancer  dehydration  dental disease  kidney disease  liver disease  low levels of calcium in the blood  lung or breathing disease (asthma)  receiving steroids like dexamethasone or prednisone  an unusual or allergic reaction to zoledronic acid, other medicines, foods, dyes, or preservatives  pregnant or trying to get pregnant  breast-feeding How should I use this medicine? This drug is injected into a vein. It is given by a health care provider in a hospital or clinic setting. Talk to your health care provider about the use of this drug in children. Special care may be needed. Overdosage: If you think you have taken too much of this medicine contact a poison control center or emergency room at once. NOTE: This medicine is only for you. Do not share this medicine with others. What if I miss a dose? Keep appointments for follow-up doses. It is important not to miss your dose. Call your health care provider if you are unable to keep an appointment. What may interact with this medicine?  certain antibiotics given by injection  NSAIDs, medicines for pain and inflammation, like ibuprofen or naproxen  some diuretics like bumetanide, furosemide  teriparatide  thalidomide This list may not describe all possible interactions. Give your health care provider a list of all the medicines, herbs, non-prescription drugs, or dietary supplements you use. Also tell  them if you smoke, drink alcohol, or use illegal drugs. Some items may interact with your medicine. What should I watch for while using this medicine? Visit your health care provider for regular checks on your progress. It may be some time before you see the benefit from this drug. Some people who take this drug have severe bone, joint, or muscle pain. This drug may also increase your risk for jaw problems or a broken thigh bone. Tell your health care provider right away if you have severe pain in your jaw, bones, joints, or muscles. Tell you health care provider if you have any pain that does not go away or that gets worse. Tell your dentist and dental surgeon that you are taking this drug. You should not have major dental surgery while on this drug. See your dentist to have a dental exam and fix any dental problems before starting this drug. Take good care of your teeth while on this drug. Make sure you see your dentist for regular follow-up appointments. You should make sure you get enough calcium and vitamin D while you are taking this drug. Discuss the foods you eat and the vitamins you take with your health care provider. Check with your health care provider if you have severe diarrhea, nausea, and vomiting, or if you sweat a lot. The loss of too much body fluid may make it dangerous for you to take this drug. You may need blood work done while you are taking this drug. Do not become pregnant while taking this drug. Women should inform their health care provider   if they wish to become pregnant or think they might be pregnant. There is potential for serious harm to an unborn child. Talk to your health care provider for more information. What side effects may I notice from receiving this medicine? Side effects that you should report to your doctor or health care provider as soon as possible:  allergic reactions (skin rash, itching or hives; swelling of the face, lips, or tongue)  bone  pain  infection (fever, chills, cough, sore throat, pain or trouble passing urine)  jaw pain, especially after dental work  joint pain  kidney injury (trouble passing urine or change in the amount of urine)  low blood pressure (dizziness; feeling faint or lightheaded, falls; unusually weak or tired)  low calcium levels (fast heartbeat; muscle cramps or pain; pain, tingling, or numbness in the hands or feet; seizures)  low magnesium levels (fast, irregular heartbeat; muscle cramp or pain; muscle weakness; tremors; seizures)  low red blood cell counts (trouble breathing; feeling faint; lightheaded, falls; unusually weak or tired)  muscle pain  redness, blistering, peeling, or loosening of the skin, including inside the mouth  severe diarrhea  swelling of the ankles, feet, hands  trouble breathing Side effects that usually do not require medical attention (report to your doctor or health care provider if they continue or are bothersome):  anxious  constipation  coughing  depressed mood  eye irritation, itching, or pain  fever  general ill feeling or flu-like symptoms  nausea  pain, redness, or irritation at site where injected  trouble sleeping This list may not describe all possible side effects. Call your doctor for medical advice about side effects. You may report side effects to FDA at 1-800-FDA-1088. Where should I keep my medicine? This drug is given in a hospital or clinic. It will not be stored at home. NOTE: This sheet is a summary. It may not cover all possible information. If you have questions about this medicine, talk to your doctor, pharmacist, or health care provider.  2021 Elsevier/Gold Standard (2019-07-20 09:13:00)  

## 2021-03-12 ENCOUNTER — Telehealth: Payer: Self-pay

## 2021-03-12 ENCOUNTER — Encounter: Payer: Self-pay | Admitting: Hematology & Oncology

## 2021-03-12 LAB — KAPPA/LAMBDA LIGHT CHAINS
Kappa free light chain: 126.6 mg/L — ABNORMAL HIGH (ref 3.3–19.4)
Kappa, lambda light chain ratio: 8.38 — ABNORMAL HIGH (ref 0.26–1.65)
Lambda free light chains: 15.1 mg/L (ref 5.7–26.3)

## 2021-03-12 LAB — IGG, IGA, IGM
IgA: 1489 mg/dL — ABNORMAL HIGH (ref 64–422)
IgG (Immunoglobin G), Serum: 504 mg/dL — ABNORMAL LOW (ref 586–1602)
IgM (Immunoglobulin M), Srm: 43 mg/dL (ref 26–217)

## 2021-03-12 NOTE — Telephone Encounter (Signed)
appts needed from 03/11/21 visit were noted by pts son, he requested the appt date of 7/12 as he will be out of town the previous weeks    Jacqueline Rivas

## 2021-03-13 LAB — PROTEIN ELECTROPHORESIS, SERUM, WITH REFLEX
A/G Ratio: 1.1 (ref 0.7–1.7)
Albumin ELP: 3.3 g/dL (ref 2.9–4.4)
Alpha-1-Globulin: 0.2 g/dL (ref 0.0–0.4)
Alpha-2-Globulin: 0.8 g/dL (ref 0.4–1.0)
Beta Globulin: 0.7 g/dL (ref 0.7–1.3)
Gamma Globulin: 1.2 g/dL (ref 0.4–1.8)
Globulin, Total: 3 g/dL (ref 2.2–3.9)
M-Spike, %: 0.6 g/dL — ABNORMAL HIGH
SPEP Interpretation: 0
Total Protein ELP: 6.3 g/dL (ref 6.0–8.5)

## 2021-03-13 LAB — IMMUNOFIXATION REFLEX, SERUM
IgA: 1442 mg/dL — ABNORMAL HIGH (ref 64–422)
IgG (Immunoglobin G), Serum: 482 mg/dL — ABNORMAL LOW (ref 586–1602)
IgM (Immunoglobulin M), Srm: 34 mg/dL (ref 26–217)

## 2021-03-31 ENCOUNTER — Other Ambulatory Visit (HOSPITAL_COMMUNITY): Payer: Self-pay

## 2021-04-07 ENCOUNTER — Other Ambulatory Visit (HOSPITAL_COMMUNITY): Payer: Self-pay

## 2021-04-08 ENCOUNTER — Other Ambulatory Visit (HOSPITAL_COMMUNITY): Payer: Self-pay

## 2021-04-22 ENCOUNTER — Other Ambulatory Visit: Payer: Self-pay

## 2021-04-22 DIAGNOSIS — C9000 Multiple myeloma not having achieved remission: Secondary | ICD-10-CM

## 2021-04-22 MED ORDER — POMALIDOMIDE 3 MG PO CAPS: 3.0000 mg | ORAL_CAPSULE | Freq: Every day | ORAL | 0 refills | Status: DC

## 2021-04-29 ENCOUNTER — Inpatient Hospital Stay: Payer: Medicare PPO | Attending: Hematology & Oncology

## 2021-04-29 ENCOUNTER — Telehealth: Payer: Self-pay

## 2021-04-29 ENCOUNTER — Inpatient Hospital Stay (HOSPITAL_BASED_OUTPATIENT_CLINIC_OR_DEPARTMENT_OTHER): Payer: Medicare PPO | Admitting: Hematology & Oncology

## 2021-04-29 ENCOUNTER — Encounter: Payer: Self-pay | Admitting: Hematology & Oncology

## 2021-04-29 ENCOUNTER — Other Ambulatory Visit: Payer: Self-pay

## 2021-04-29 ENCOUNTER — Inpatient Hospital Stay: Payer: Medicare PPO

## 2021-04-29 VITALS — BP 154/61 | HR 54 | Temp 98.7°F | Resp 17 | Wt 126.0 lb

## 2021-04-29 DIAGNOSIS — Z79899 Other long term (current) drug therapy: Secondary | ICD-10-CM | POA: Insufficient documentation

## 2021-04-29 DIAGNOSIS — C9002 Multiple myeloma in relapse: Secondary | ICD-10-CM | POA: Insufficient documentation

## 2021-04-29 DIAGNOSIS — D630 Anemia in neoplastic disease: Secondary | ICD-10-CM | POA: Insufficient documentation

## 2021-04-29 DIAGNOSIS — C9000 Multiple myeloma not having achieved remission: Secondary | ICD-10-CM | POA: Diagnosis not present

## 2021-04-29 DIAGNOSIS — M7989 Other specified soft tissue disorders: Secondary | ICD-10-CM | POA: Diagnosis not present

## 2021-04-29 DIAGNOSIS — I429 Cardiomyopathy, unspecified: Secondary | ICD-10-CM | POA: Diagnosis not present

## 2021-04-29 DIAGNOSIS — Z95828 Presence of other vascular implants and grafts: Secondary | ICD-10-CM

## 2021-04-29 DIAGNOSIS — Z853 Personal history of malignant neoplasm of breast: Secondary | ICD-10-CM | POA: Diagnosis not present

## 2021-04-29 LAB — CBC WITH DIFFERENTIAL (CANCER CENTER ONLY)
Abs Immature Granulocytes: 0.05 10*3/uL (ref 0.00–0.07)
Basophils Absolute: 0.1 10*3/uL (ref 0.0–0.1)
Basophils Relative: 2 %
Eosinophils Absolute: 0.1 10*3/uL (ref 0.0–0.5)
Eosinophils Relative: 2 %
HCT: 32.5 % — ABNORMAL LOW (ref 36.0–46.0)
Hemoglobin: 10.6 g/dL — ABNORMAL LOW (ref 12.0–15.0)
Immature Granulocytes: 1 %
Lymphocytes Relative: 12 %
Lymphs Abs: 0.7 10*3/uL (ref 0.7–4.0)
MCH: 33 pg (ref 26.0–34.0)
MCHC: 32.6 g/dL (ref 30.0–36.0)
MCV: 101.2 fL — ABNORMAL HIGH (ref 80.0–100.0)
Monocytes Absolute: 0.4 10*3/uL (ref 0.1–1.0)
Monocytes Relative: 7 %
Neutro Abs: 4.1 10*3/uL (ref 1.7–7.7)
Neutrophils Relative %: 76 %
Platelet Count: 176 10*3/uL (ref 150–400)
RBC: 3.21 MIL/uL — ABNORMAL LOW (ref 3.87–5.11)
RDW: 14.6 % (ref 11.5–15.5)
WBC Count: 5.4 10*3/uL (ref 4.0–10.5)
nRBC: 0 % (ref 0.0–0.2)

## 2021-04-29 LAB — CMP (CANCER CENTER ONLY)
ALT: 8 U/L (ref 0–44)
AST: 15 U/L (ref 15–41)
Albumin: 3.7 g/dL (ref 3.5–5.0)
Alkaline Phosphatase: 63 U/L (ref 38–126)
Anion gap: 9 (ref 5–15)
BUN: 35 mg/dL — ABNORMAL HIGH (ref 8–23)
CO2: 26 mmol/L (ref 22–32)
Calcium: 10.3 mg/dL (ref 8.9–10.3)
Chloride: 103 mmol/L (ref 98–111)
Creatinine: 1.39 mg/dL — ABNORMAL HIGH (ref 0.44–1.00)
GFR, Estimated: 37 mL/min — ABNORMAL LOW (ref >60)
Glucose, Bld: 91 mg/dL (ref 70–99)
Potassium: 4.1 mmol/L (ref 3.5–5.1)
Sodium: 138 mmol/L (ref 135–145)
Total Bilirubin: 0.6 mg/dL (ref 0.3–1.2)
Total Protein: 6.6 g/dL (ref 6.5–8.1)

## 2021-04-29 LAB — LACTATE DEHYDROGENASE: LDH: 105 U/L (ref 98–192)

## 2021-04-29 MED ORDER — HEPARIN SOD (PORK) LOCK FLUSH 100 UNIT/ML IV SOLN
500.0000 [IU] | Freq: Once | INTRAVENOUS | Status: AC
  Administered 2021-04-29: 500 [IU] via INTRAVENOUS
  Filled 2021-04-29: qty 5

## 2021-04-29 MED ORDER — SODIUM CHLORIDE 0.9% FLUSH
10.0000 mL | Freq: Once | INTRAVENOUS | Status: AC
  Administered 2021-04-29: 10 mL via INTRAVENOUS
  Filled 2021-04-29: qty 10

## 2021-04-29 NOTE — Telephone Encounter (Signed)
Appts made and printed for pt per 04/29/21 los  [employer]

## 2021-04-29 NOTE — Progress Notes (Signed)
Hematology and Oncology Follow Up Visit  Jacqueline Rivas [id number] 04-Nov-1931 87 y.o. 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  She actually seems to be doing pretty well.  She says she is not taking the Ninlaro weekly.  She says she sometimes does not like to take it because it does not make her feel good.  She is taking the pomalidomide.  The last time we saw her, she clearly was responding to treatment.  Her monoclonal spike was down to 0.6 g/dL.  Her IgA level was down to 460 mg/dL.  The kappa light chain was down to 13 mg/dL.  Again, I told her how important was to take both pills.  The swelling in her legs seems to be improving.  She has lost little bit of weight.  This might be fluid weight.  However it might also be that she is not eating all that well.  She has had no nausea or vomiting.  She has had no diarrhea.  There is been no bleeding.  There is been no cough.  I am just glad that her overall quality life seems to be doing okay.    Overall, her performance status is probably ECOG 2.    Medications:  Current Outpatient Medications:    aspirin EC 81 MG tablet, Take 162 mg by mouth daily., Disp: , Rfl:    calcium carbonate (OS-CAL) 600 MG TABS tablet, Take 1,200 mg by mouth daily with breakfast., Disp: , Rfl:    Cholecalciferol (VITAMIN D3) 2000 units TABS, daily., Disp: ,  Rfl:    Coenzyme Q10 (CO Q 10) 100 MG CAPS, Take 100 mg by mouth every morning., Disp: , Rfl:    ixazomib citrate (NINLARO) 3 MG capsule, TAKE 1 CAPSULE (3 MG) BY MOUTH WEEKLY, 3 WEEKS ON, 1 WEEK OFF, REPEAT EVERY 4 WEEKS. TAKE ON AN EMPTY STOMACH 1HR BEFORE OR 2HR AFTER MEALS (Patient taking differently: TAKE 1 CAPSULE (3 MG) BY MOUTH WEEKLY, 3 WEEKS ON, 1 WEEK OFF, REPEAT EVERY 4 WEEKS. TAKE ON AN EMPTY STOMACH 1HR BEFORE OR 2HR AFTER MEALS), Disp: 3 capsule, Rfl: 5   lisinopril-hydrochlorothiazide (ZESTORETIC) 20-12.5 MG tablet, Take 1 tablet by mouth daily., Disp: , Rfl:    magnesium 30 MG tablet, Take 30 mg by mouth every morning., Disp: , Rfl:    pomalidomide (POMALYST) 3 MG capsule, Take 1 capsule (3 mg total) by mouth daily. Take for 21 days, then hold for 7 days. Repeat every 28 days. Auth# [id number], Disp: 21 capsule, Rfl: 0   Potassium 99 MG TABS, , Disp: , Rfl:    Zinc Sulfate (ZINC 15 PO), Take 1 tablet by mouth daily., Disp: , Rfl:    zolendronic acid (ZOMETA) 4 MG/5ML injection, Inject into  the vein., Disp: , Rfl:  No current facility-administered medications for this visit.  Facility-Administered Medications Ordered in Other Visits:    sodium chloride flush (NS) 0.9 % injection 10 mL, 10 mL, Intravenous, PRN, [place], Sarah M, NP, 10 mL at 05/11/18 1331  Allergies: No Known Allergies  Past Medical History, Surgical history, Social history, and Family History were reviewed and updated.  Review of Systems: Review of Systems  Constitutional: Negative.   HENT: Negative.    Eyes: Negative.   Respiratory: Negative.    Cardiovascular:  Positive for leg swelling.  Gastrointestinal: Negative.   Genitourinary: Negative.   Musculoskeletal:  Positive for joint pain and myalgias.  Skin: Negative.   Endo/Heme/Allergies: Negative.   Psychiatric/Behavioral: Negative.      Physical Exam:  weight is 126 lb (57.2 kg). Her oral temperature is 98.7 F (37.1 C). Her blood pressure is  154/61 (abnormal) and her pulse is 54 (abnormal). Her respiration is 17 and oxygen saturation is 100%.   Physical Exam Vitals reviewed.  HENT:     Head: Normocephalic and atraumatic.  Eyes:     Pupils: Pupils are equal, round, and reactive to light.  Cardiovascular:     Rate and Rhythm: Normal rate and regular rhythm.     Heart sounds: Normal heart sounds.  Pulmonary:     Effort: Pulmonary effort is normal.     Breath sounds: Normal breath sounds.  Abdominal:     General: Bowel sounds are normal.     Palpations: Abdomen is soft.  Musculoskeletal:        General: No tenderness or deformity. Normal range of motion.     Cervical back: Normal range of motion.  Lymphadenopathy:     Cervical: No cervical adenopathy.  Skin:    General: Skin is warm and dry.     Findings: No erythema or rash.  Neurological:     Mental Status: She is alert and oriented to person, place, and time.  Psychiatric:        Behavior: Behavior normal.        Thought Content: Thought content normal.        Judgment: Judgment normal.     Lab Results  Component Value Date   WBC 5.4 04/29/2021   HGB 10.6 (L) 04/29/2021   HCT 32.5 (L) 04/29/2021   MCV 101.2 (H) 04/29/2021   PLT 176 04/29/2021     Chemistry      Component Value Date/Time   NA 138 04/29/2021 0908   NA 143 09/23/2017 1019   K 4.1 04/29/2021 0908   K 4.0 09/23/2017 1019   CL 103 04/29/2021 0908   CL 104 09/23/2017 1019   CO2 26 04/29/2021 0908   CO2 26 09/23/2017 1019   BUN 35 (H) 04/29/2021 0908   BUN 16 09/23/2017 1019   CREATININE 1.39 (H) 04/29/2021 0908   CREATININE 1.4 (H) 09/23/2017 1019      Component Value Date/Time   CALCIUM 10.3 04/29/2021 0908   CALCIUM 9.7 09/23/2017 1019   CALCIUM 11.1 (H) 11/13/2012 1526   ALKPHOS 63 04/29/2021 0908   ALKPHOS 61 09/23/2017 1019   AST 15 04/29/2021 0908   ALT 8 04/29/2021 0908   ALT 15 09/23/2017 1019   BILITOT 0.6 04/29/2021 0908     Impression and Plan: Jacqueline Rivas is  87 year old white female. She has both IgA kappa myeloma and stage II breast cancer.   She is responding to treatment by her last myeloma studies.  We  will have to see what these levels are.  Hopefully, they will be stable if not better.  I told her that if she does not take the [facility] as scheduled, then the next option would have to be intravenous or subcutaneous treatment.  This certainly would not meet her quality of life standards.  She will be due for her Zometa the next time we see her in August.  Again, I does want to make sure that her quality of life is doing okay.    [name], MD 7/12/202210:34 AM

## 2021-04-29 NOTE — Patient Instructions (Signed)
Implanted Port Home Guide An implanted port is a device that is placed under the skin. It is usually placed in the chest. The device can be used to give IV medicine, to take blood, or for dialysis. You may have an implanted port if: You need IV medicine that would be irritating to the small veins in your hands or arms. You need IV medicines, such as antibiotics, for a long period of time. You need IV nutrition for a long period of time. You need dialysis. When you have a port, your health care provider can choose to use the port instead of veins in your arms for these procedures. You may have fewer limitations when using a port than you would if you used other types of long-term IVs, and you will likely be able to return to normal activities afteryour incision heals. An implanted port has two main parts: Reservoir. The reservoir is the part where a needle is inserted to give medicines or draw blood. The reservoir is round. After it is placed, it appears as a small, raised area under your skin. Catheter. The catheter is a thin, flexible tube that connects the reservoir to a vein. Medicine that is inserted into the reservoir goes into the catheter and then into the vein. How is my port accessed? To access your port: A numbing cream may be placed on the skin over the port site. Your health care provider will put on a mask and sterile gloves. The skin over your port will be cleaned carefully with a germ-killing soap and allowed to dry. Your health care provider will gently pinch the port and insert a needle into it. Your health care provider will check for a blood return to make sure the port is in the vein and is not clogged. If your port needs to remain accessed to get medicine continuously (constant infusion), your health care provider will place a clear bandage (dressing) over the needle site. The dressing and needle will need to be changed every week, or as told by your health care provider. What  is flushing? Flushing helps keep the port from getting clogged. Follow instructions from your health care provider about how and when to flush the port. Ports are usually flushed with saline solution or a medicine called heparin. The need for flushing will depend on how the port is used: If the port is only used from time to time to give medicines or draw blood, the port may need to be flushed: Before and after medicines have been given. Before and after blood has been drawn. As part of routine maintenance. Flushing may be recommended every 4-6 weeks. If a constant infusion is running, the port may not need to be flushed. Throw away any syringes in a disposal container that is meant for sharp items (sharps container). You can buy a sharps container from a pharmacy, or you can make one by using an empty hard plastic bottle with a cover. How long will my port stay implanted? The port can stay in for as long as your health care provider thinks it is needed. When it is time for the port to come out, a surgery will be done to remove it. The surgery will be similar to the procedure that was done to putthe port in. Follow these instructions at home:  Flush your port as told by your health care provider. If you need an infusion over several days, follow instructions from your health care provider about how to take   care of your port site. Make sure you: Wash your hands with soap and water before you change your dressing. If soap and water are not available, use alcohol-based hand sanitizer. Change your dressing as told by your health care provider. Place any used dressings or infusion bags into a plastic bag. Throw that bag in the trash. Keep the dressing that covers the needle clean and dry. Do not get it wet. Do not use scissors or sharp objects near the tube. Keep the tube clamped, unless it is being used. Check your port site every day for signs of infection. Check for: Redness, swelling, or  pain. Fluid or blood. Pus or a bad smell. Protect the skin around the port site. Avoid wearing bra straps that rub or irritate the site. Protect the skin around your port from seat belts. Place a soft pad over your chest if needed. Bathe or shower as told by your health care provider. The site may get wet as long as you are not actively receiving an infusion. Return to your normal activities as told by your health care provider. Ask your health care provider what activities are safe for you. Carry a medical alert card or wear a medical alert bracelet at all times. This will let health care providers know that you have an implanted port in case of an emergency. Get help right away if: You have redness, swelling, or pain at the port site. You have fluid or blood coming from your port site. You have pus or a bad smell coming from the port site. You have a fever. Summary Implanted ports are usually placed in the chest for long-term IV access. Follow instructions from your health care provider about flushing the port and changing bandages (dressings). Take care of the area around your port by avoiding clothing that puts pressure on the area, and by watching for signs of infection. Protect the skin around your port from seat belts. Place a soft pad over your chest if needed. Get help right away if you have a fever or you have redness, swelling, pain, drainage, or a bad smell at the port site. This information is not intended to replace advice given to you by your health care provider. Make sure you discuss any questions you have with your healthcare provider. Document Revised: 02/19/2020 Document Reviewed: 02/19/2020 Elsevier Patient Education  2022 Elsevier Inc.  

## 2021-04-30 ENCOUNTER — Other Ambulatory Visit (HOSPITAL_COMMUNITY): Payer: Self-pay

## 2021-04-30 LAB — IGG, IGA, IGM
IgA: 1487 mg/dL — ABNORMAL HIGH (ref 64–422)
IgG (Immunoglobin G), Serum: 460 mg/dL — ABNORMAL LOW (ref 586–1602)
IgM (Immunoglobulin M), Srm: 28 mg/dL (ref 26–217)

## 2021-04-30 LAB — KAPPA/LAMBDA LIGHT CHAINS
Kappa free light chain: 168.6 mg/L — ABNORMAL HIGH (ref 3.3–19.4)
Kappa, lambda light chain ratio: 1.8 — ABNORMAL HIGH (ref 0.26–1.65)
Lambda free light chains: 93.8 mg/L — ABNORMAL HIGH (ref 5.7–26.3)

## 2021-04-30 LAB — CANCER ANTIGEN 27.29: CA 27.29: 13.7 U/mL (ref 0.0–38.6)

## 2021-05-02 LAB — PROTEIN ELECTROPHORESIS, SERUM, WITH REFLEX
A/G Ratio: 1.1 (ref 0.7–1.7)
Albumin ELP: 3.6 g/dL (ref 2.9–4.4)
Alpha-1-Globulin: 0.3 g/dL (ref 0.0–0.4)
Alpha-2-Globulin: 0.9 g/dL (ref 0.4–1.0)
Beta Globulin: 0.9 g/dL (ref 0.7–1.3)
Gamma Globulin: 1.4 g/dL (ref 0.4–1.8)
Globulin, Total: 3.4 g/dL (ref 2.2–3.9)
M-Spike, %: 0.8 g/dL — ABNORMAL HIGH
SPEP Interpretation: 0
Total Protein ELP: 7 g/dL (ref 6.0–8.5)

## 2021-05-02 LAB — IMMUNOFIXATION REFLEX, SERUM
IgA: 1597 mg/dL — ABNORMAL HIGH (ref 64–422)
IgG (Immunoglobin G), Serum: 520 mg/dL — ABNORMAL LOW (ref 586–1602)
IgM (Immunoglobulin M), Srm: 40 mg/dL (ref 26–217)

## 2021-05-05 ENCOUNTER — Telehealth: Payer: Self-pay | Admitting: *Deleted

## 2021-05-05 NOTE — Telephone Encounter (Addendum)
-----   Message from [name], MD sent at 05/04/2021  8:16 PM EDT ----- [name] to let her know that the myeloma is a little higher.  She MUST take the Ninlaro every week!!!!  Laurey Arrow

## 2021-05-06 ENCOUNTER — Telehealth: Payer: Self-pay

## 2021-05-06 NOTE — Telephone Encounter (Signed)
Spoke with patient, she is back on her ninlaro. She just restarted it this week as she had to stop it due to her leg infection for a while. She was appreciative of call and denies any other questions or concerns at this time

## 2021-05-12 ENCOUNTER — Encounter: Payer: Self-pay | Admitting: Hematology & Oncology

## 2021-05-19 ENCOUNTER — Telehealth: Payer: Self-pay

## 2021-05-27 ENCOUNTER — Other Ambulatory Visit: Payer: Self-pay

## 2021-05-27 DIAGNOSIS — C9000 Multiple myeloma not having achieved remission: Secondary | ICD-10-CM

## 2021-05-27 MED ORDER — POMALIDOMIDE 3 MG PO CAPS: 3.0000 mg | ORAL_CAPSULE | Freq: Every day | ORAL | 0 refills | Status: DC

## 2021-05-28 ENCOUNTER — Other Ambulatory Visit (HOSPITAL_COMMUNITY): Payer: Self-pay

## 2021-06-13 ENCOUNTER — Encounter: Payer: Self-pay | Admitting: Hematology & Oncology

## 2021-06-13 ENCOUNTER — Inpatient Hospital Stay (HOSPITAL_BASED_OUTPATIENT_CLINIC_OR_DEPARTMENT_OTHER): Payer: Medicare PPO | Admitting: Hematology & Oncology

## 2021-06-13 ENCOUNTER — Inpatient Hospital Stay: Payer: Medicare PPO | Attending: Hematology & Oncology

## 2021-06-13 ENCOUNTER — Inpatient Hospital Stay: Payer: Medicare PPO

## 2021-06-13 ENCOUNTER — Other Ambulatory Visit: Payer: Self-pay

## 2021-06-13 VITALS — BP 169/63 | HR 55 | Temp 96.6°F | Resp 17 | Ht 64.0 in | Wt 134.1 lb

## 2021-06-13 DIAGNOSIS — C50911 Malignant neoplasm of unspecified site of right female breast: Secondary | ICD-10-CM

## 2021-06-13 DIAGNOSIS — C9000 Multiple myeloma not having achieved remission: Secondary | ICD-10-CM

## 2021-06-13 DIAGNOSIS — Z79899 Other long term (current) drug therapy: Secondary | ICD-10-CM | POA: Insufficient documentation

## 2021-06-13 DIAGNOSIS — C9002 Multiple myeloma in relapse: Secondary | ICD-10-CM | POA: Diagnosis not present

## 2021-06-13 DIAGNOSIS — C773 Secondary and unspecified malignant neoplasm of axilla and upper limb lymph nodes: Secondary | ICD-10-CM | POA: Diagnosis not present

## 2021-06-13 LAB — CBC WITH DIFFERENTIAL (CANCER CENTER ONLY)
Abs Immature Granulocytes: 0.02 10*3/uL (ref 0.00–0.07)
Basophils Absolute: 0.1 10*3/uL (ref 0.0–0.1)
Basophils Relative: 2 %
Eosinophils Absolute: 0.2 10*3/uL (ref 0.0–0.5)
Eosinophils Relative: 4 %
HCT: 33.6 % — ABNORMAL LOW (ref 36.0–46.0)
Hemoglobin: 11 g/dL — ABNORMAL LOW (ref 12.0–15.0)
Immature Granulocytes: 1 %
Lymphocytes Relative: 12 %
Lymphs Abs: 0.5 10*3/uL — ABNORMAL LOW (ref 0.7–4.0)
MCH: 32.9 pg (ref 26.0–34.0)
MCHC: 32.7 g/dL (ref 30.0–36.0)
MCV: 100.6 fL — ABNORMAL HIGH (ref 80.0–100.0)
Monocytes Absolute: 0.7 10*3/uL (ref 0.1–1.0)
Monocytes Relative: 16 %
Neutro Abs: 2.8 10*3/uL (ref 1.7–7.7)
Neutrophils Relative %: 65 %
Platelet Count: 217 10*3/uL (ref 150–400)
RBC: 3.34 MIL/uL — ABNORMAL LOW (ref 3.87–5.11)
RDW: 16.3 % — ABNORMAL HIGH (ref 11.5–15.5)
WBC Count: 4.3 10*3/uL (ref 4.0–10.5)
nRBC: 0 % (ref 0.0–0.2)

## 2021-06-13 LAB — CMP (CANCER CENTER ONLY)
ALT: 10 U/L (ref 0–44)
AST: 16 U/L (ref 15–41)
Albumin: 3.7 g/dL (ref 3.5–5.0)
Alkaline Phosphatase: 51 U/L (ref 38–126)
Anion gap: 9 (ref 5–15)
BUN: 18 mg/dL (ref 8–23)
CO2: 25 mmol/L (ref 22–32)
Calcium: 9.9 mg/dL (ref 8.9–10.3)
Chloride: 105 mmol/L (ref 98–111)
Creatinine: 1.1 mg/dL — ABNORMAL HIGH (ref 0.44–1.00)
GFR, Estimated: 49 mL/min — ABNORMAL LOW (ref >60)
Glucose, Bld: 85 mg/dL (ref 70–99)
Potassium: 3.9 mmol/L (ref 3.5–5.1)
Sodium: 139 mmol/L (ref 135–145)
Total Bilirubin: 0.7 mg/dL (ref 0.3–1.2)
Total Protein: 6.8 g/dL (ref 6.5–8.1)

## 2021-06-13 LAB — LACTATE DEHYDROGENASE: LDH: 110 U/L (ref 98–192)

## 2021-06-13 MED ORDER — HEPARIN SOD (PORK) LOCK FLUSH 100 UNIT/ML IV SOLN
250.0000 [IU] | Freq: Once | INTRAVENOUS | Status: AC | PRN
  Administered 2021-06-13: 500 [IU]

## 2021-06-13 MED ORDER — ZOLEDRONIC ACID 4 MG/100ML IV SOLN: 4.0000 mg | Freq: Once | INTRAVENOUS | Status: DC

## 2021-06-13 MED ORDER — SODIUM CHLORIDE 0.9% FLUSH
3.0000 mL | Freq: Once | INTRAVENOUS | Status: AC | PRN
  Administered 2021-06-13: 10 mL

## 2021-06-13 MED ORDER — ZOLEDRONIC ACID 4 MG/5ML IV CONC
3.0000 mg | Freq: Once | INTRAVENOUS | Status: AC
  Administered 2021-06-13: 3 mg via INTRAVENOUS
  Filled 2021-06-13: qty 3.75

## 2021-06-13 MED ORDER — SODIUM CHLORIDE 0.9 % IV SOLN: Freq: Once | INTRAVENOUS | Status: AC

## 2021-06-13 NOTE — Patient Instructions (Signed)

## 2021-06-13 NOTE — Progress Notes (Signed)
Hematology and Oncology Follow Up Visit  Shanaia Drozda [id number] 10-13-1932 87 y.o. 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  Sloboda is back for a follow-up.  She is doing okay.  She says she is taking both the Ninlaro and the pomalidomide.  Hopefully this is truly the case.  She had a last myeloma studies show that her monoclonal spike was up to 0.8 g/dL.  Her IgA level was up to 1500 mg/dL.  The Kappa light chain was up to 17 mg/dL.  Hopefully, all this is going back down.  She has had no problems with fever.  There is been no cough or shortness of breath.  She does have some leg swelling.  She does have high blood pressure pills.  She does have diuretics.  She has had no diarrhea.  There is been no issues with headache.  We try to get her to exercise little bit more.  I realize this is sometimes difficult.  I am just glad that overall, her quality of life seems to be doing pretty well for her.  Overall, her performance status is ECOG 2.    Medications:  Current Outpatient Medications:    aspirin EC 81 MG tablet, Take 162 mg by mouth daily., Disp: , Rfl:    calcium carbonate (OS-CAL) 600 MG TABS tablet, Take 1,200 mg by mouth daily with breakfast., Disp: , Rfl:    Cholecalciferol (VITAMIN D3) 2000 units TABS, daily., Disp: , Rfl:    Coenzyme Q10 (CO Q 10) 100 MG CAPS, Take 100 mg by mouth every morning., Disp: , Rfl:     ixazomib citrate (NINLARO) 3 MG capsule, TAKE 1 CAPSULE (3 MG) BY MOUTH WEEKLY, 3 WEEKS ON, 1 WEEK OFF, REPEAT EVERY 4 WEEKS. TAKE ON AN EMPTY STOMACH 1HR BEFORE OR 2HR AFTER MEALS (Patient taking differently: TAKE 1 CAPSULE (3 MG) BY MOUTH WEEKLY, 3 WEEKS ON, 1 WEEK OFF, REPEAT EVERY 4 WEEKS. TAKE ON AN EMPTY STOMACH 1HR BEFORE OR 2HR AFTER MEALS), Disp: 3 capsule, Rfl: 5   lisinopril-hydrochlorothiazide (ZESTORETIC) 20-12.5 MG tablet, Take 1 tablet by mouth daily., Disp: , Rfl:    magnesium 30 MG tablet, Take 30 mg by mouth every morning., Disp: , Rfl:    pomalidomide (POMALYST) 3 MG capsule, Take 1 capsule (3 mg total) by mouth daily. Take for 21 days, then hold for 7 days. Repeat every 28 days. Auth# [id number], Disp: 21 capsule, Rfl: 0   Potassium 99 MG TABS, , Disp: , Rfl:    Zinc Sulfate (ZINC 15 PO), Take 1 tablet by mouth daily., Disp: , Rfl:    zolendronic acid (ZOMETA) 4 MG/5ML injection, Inject into the vein., Disp: , Rfl:  No current facility-administered medications for this visit.  Facility-Administered Medications Ordered in Other Visits:  0.9 %  sodium chloride infusion, , Intravenous, Once, Daksha Koone, Rudell Cobb, MD   sodium chloride flush (NS) 0.9 % injection 10 mL, 10 mL, Intravenous, PRN, [place], Sarah M, NP, 10 mL at 05/11/18 1331   Zoledronic Acid (ZOMETA) IVPB 4 mg, 4 mg, Intravenous, Once, Rox Mcgriff, Rudell Cobb, MD  Allergies: No Known Allergies  Past Medical History, Surgical history, Social history, and Family History were reviewed and updated.  Review of Systems: Review of Systems  Constitutional: Negative.   HENT: Negative.    Eyes: Negative.   Respiratory: Negative.    Cardiovascular:  Positive for leg swelling.  Gastrointestinal: Negative.   Genitourinary: Negative.   Musculoskeletal:  Positive for joint pain and myalgias.  Skin: Negative.   Endo/Heme/Allergies: Negative.   Psychiatric/Behavioral: Negative.      Physical Exam:  height is '5\' 4"'$  (1.626 m)  and weight is 134 lb 1.9 oz (60.8 kg). Her oral temperature is 96.6 F (35.9 C) (abnormal). Her blood pressure is 169/63 (abnormal) and her pulse is 55 (abnormal). Her respiration is 17 and oxygen saturation is 97%.   Physical Exam Vitals reviewed.  HENT:     Head: Normocephalic and atraumatic.  Eyes:     Pupils: Pupils are equal, round, and reactive to light.  Cardiovascular:     Rate and Rhythm: Normal rate and regular rhythm.     Heart sounds: Normal heart sounds.  Pulmonary:     Effort: Pulmonary effort is normal.     Breath sounds: Normal breath sounds.  Abdominal:     General: Bowel sounds are normal.     Palpations: Abdomen is soft.  Musculoskeletal:        General: No tenderness or deformity. Normal range of motion.     Cervical back: Normal range of motion.  Lymphadenopathy:     Cervical: No cervical adenopathy.  Skin:    General: Skin is warm and dry.     Findings: No erythema or rash.  Neurological:     Mental Status: She is alert and oriented to person, place, and time.  Psychiatric:        Behavior: Behavior normal.        Thought Content: Thought content normal.        Judgment: Judgment normal.     Lab Results  Component Value Date   WBC 4.3 06/13/2021   HGB 11.0 (L) 06/13/2021   HCT 33.6 (L) 06/13/2021   MCV 100.6 (H) 06/13/2021   PLT 217 06/13/2021     Chemistry      Component Value Date/Time   NA 139 06/13/2021 0914   NA 143 09/23/2017 1019   K 3.9 06/13/2021 0914   K 4.0 09/23/2017 1019   CL 105 06/13/2021 0914   CL 104 09/23/2017 1019   CO2 25 06/13/2021 0914   CO2 26 09/23/2017 1019   BUN 18 06/13/2021 0914   BUN 16 09/23/2017 1019   CREATININE 1.10 (H) 06/13/2021 0914   CREATININE 1.4 (H) 09/23/2017 1019      Component Value Date/Time   CALCIUM 9.9 06/13/2021 0914   CALCIUM 9.7 09/23/2017 1019   CALCIUM 11.1 (H) 11/13/2012 1526   ALKPHOS 51 06/13/2021 0914   ALKPHOS 61 09/23/2017 1019   AST 16 06/13/2021 0914   ALT 10 06/13/2021  0914   ALT 15 09/23/2017 1019   BILITOT 0.7 06/13/2021 0914     Impression and Plan: Ms. Lycan is 87 year old white female. She has both IgA kappa myeloma and stage  II breast cancer.   Hopefully, we will see her myeloma numbers going back down.  Again she is taking her medications as directed.  If we do not see that her numbers are improved, we will have to change therapy.  Given that she lives down in [place], we just may have to get her to one of the myeloma specialist at [facility].  I would hate to lose her but I think would be somewhat easier for her to be treated down there.  She will get her Zometa today.  We will plan to get her back in about 6 weeks.   [name], MD 8/26/202210:51 AM

## 2021-06-13 NOTE — Patient Instructions (Signed)

## 2021-06-14 LAB — IGG, IGA, IGM
IgA: 1252 mg/dL — ABNORMAL HIGH (ref 64–422)
IgG (Immunoglobin G), Serum: 498 mg/dL — ABNORMAL LOW (ref 586–1602)
IgM (Immunoglobulin M), Srm: 48 mg/dL (ref 26–217)

## 2021-06-16 LAB — KAPPA/LAMBDA LIGHT CHAINS
Kappa free light chain: 94.8 mg/L — ABNORMAL HIGH (ref 3.3–19.4)
Kappa, lambda light chain ratio: 5.48 — ABNORMAL HIGH (ref 0.26–1.65)
Lambda free light chains: 17.3 mg/L (ref 5.7–26.3)

## 2021-06-17 ENCOUNTER — Ambulatory Visit: Payer: PRIVATE HEALTH INSURANCE

## 2021-06-17 ENCOUNTER — Other Ambulatory Visit: Payer: PRIVATE HEALTH INSURANCE

## 2021-06-17 ENCOUNTER — Ambulatory Visit: Payer: PRIVATE HEALTH INSURANCE | Admitting: Hematology & Oncology

## 2021-06-19 ENCOUNTER — Other Ambulatory Visit (HOSPITAL_COMMUNITY): Payer: Self-pay

## 2021-06-19 LAB — PROTEIN ELECTROPHORESIS, SERUM, WITH REFLEX
A/G Ratio: 1.2 (ref 0.7–1.7)
Albumin ELP: 3.4 g/dL (ref 2.9–4.4)
Alpha-1-Globulin: 0.2 g/dL (ref 0.0–0.4)
Alpha-2-Globulin: 0.8 g/dL (ref 0.4–1.0)
Beta Globulin: 0.7 g/dL (ref 0.7–1.3)
Gamma Globulin: 1.1 g/dL (ref 0.4–1.8)
Globulin, Total: 2.8 g/dL (ref 2.2–3.9)
M-Spike, %: 0.7 g/dL — ABNORMAL HIGH
SPEP Interpretation: 0
Total Protein ELP: 6.2 g/dL (ref 6.0–8.5)

## 2021-06-19 LAB — IMMUNOFIXATION REFLEX, SERUM
IgA: 1358 mg/dL — ABNORMAL HIGH (ref 64–422)
IgG (Immunoglobin G), Serum: 551 mg/dL — ABNORMAL LOW (ref 586–1602)
IgM (Immunoglobulin M), Srm: 39 mg/dL (ref 26–217)

## 2021-06-25 ENCOUNTER — Other Ambulatory Visit (HOSPITAL_COMMUNITY): Payer: Self-pay

## 2021-06-27 ENCOUNTER — Other Ambulatory Visit (HOSPITAL_COMMUNITY): Payer: Self-pay

## 2021-07-15 ENCOUNTER — Other Ambulatory Visit: Payer: Self-pay

## 2021-07-15 DIAGNOSIS — C9000 Multiple myeloma not having achieved remission: Secondary | ICD-10-CM

## 2021-07-15 MED ORDER — POMALIDOMIDE 3 MG PO CAPS: 3.0000 mg | ORAL_CAPSULE | Freq: Every day | ORAL | 0 refills | Status: DC

## 2021-07-16 ENCOUNTER — Other Ambulatory Visit (HOSPITAL_COMMUNITY): Payer: Self-pay

## 2021-07-28 ENCOUNTER — Other Ambulatory Visit: Payer: Medicare PPO

## 2021-07-28 ENCOUNTER — Ambulatory Visit: Payer: Medicare PPO | Admitting: Hematology & Oncology

## 2021-07-29 ENCOUNTER — Other Ambulatory Visit: Payer: Self-pay

## 2021-07-29 ENCOUNTER — Inpatient Hospital Stay: Payer: Medicare PPO | Attending: Hematology & Oncology

## 2021-07-29 ENCOUNTER — Other Ambulatory Visit (HOSPITAL_COMMUNITY): Payer: Self-pay

## 2021-07-29 ENCOUNTER — Encounter: Payer: Self-pay | Admitting: Hematology & Oncology

## 2021-07-29 ENCOUNTER — Inpatient Hospital Stay: Payer: Medicare PPO

## 2021-07-29 ENCOUNTER — Inpatient Hospital Stay (HOSPITAL_BASED_OUTPATIENT_CLINIC_OR_DEPARTMENT_OTHER): Payer: Medicare PPO | Admitting: Hematology & Oncology

## 2021-07-29 VITALS — BP 177/59 | HR 65 | Temp 98.8°F | Resp 18 | Wt 133.8 lb

## 2021-07-29 DIAGNOSIS — C9002 Multiple myeloma in relapse: Secondary | ICD-10-CM | POA: Diagnosis not present

## 2021-07-29 DIAGNOSIS — C9000 Multiple myeloma not having achieved remission: Secondary | ICD-10-CM | POA: Diagnosis not present

## 2021-07-29 DIAGNOSIS — Z79899 Other long term (current) drug therapy: Secondary | ICD-10-CM | POA: Diagnosis not present

## 2021-07-29 DIAGNOSIS — R140 Abdominal distension (gaseous): Secondary | ICD-10-CM | POA: Diagnosis not present

## 2021-07-29 DIAGNOSIS — I429 Cardiomyopathy, unspecified: Secondary | ICD-10-CM | POA: Diagnosis not present

## 2021-07-29 DIAGNOSIS — Z170 Estrogen receptor positive status [ER+]: Secondary | ICD-10-CM | POA: Insufficient documentation

## 2021-07-29 DIAGNOSIS — D630 Anemia in neoplastic disease: Secondary | ICD-10-CM | POA: Insufficient documentation

## 2021-07-29 DIAGNOSIS — C50911 Malignant neoplasm of unspecified site of right female breast: Secondary | ICD-10-CM | POA: Diagnosis not present

## 2021-07-29 DIAGNOSIS — Z95828 Presence of other vascular implants and grafts: Secondary | ICD-10-CM

## 2021-07-29 DIAGNOSIS — C773 Secondary and unspecified malignant neoplasm of axilla and upper limb lymph nodes: Secondary | ICD-10-CM

## 2021-07-29 LAB — CMP (CANCER CENTER ONLY)
ALT: 12 U/L (ref 0–44)
AST: 18 U/L (ref 15–41)
Albumin: 3.9 g/dL (ref 3.5–5.0)
Alkaline Phosphatase: 50 U/L (ref 38–126)
Anion gap: 10 (ref 5–15)
BUN: 22 mg/dL (ref 8–23)
CO2: 26 mmol/L (ref 22–32)
Calcium: 10.4 mg/dL — ABNORMAL HIGH (ref 8.9–10.3)
Chloride: 104 mmol/L (ref 98–111)
Creatinine: 1.09 mg/dL — ABNORMAL HIGH (ref 0.44–1.00)
GFR, Estimated: 49 mL/min — ABNORMAL LOW (ref >60)
Glucose, Bld: 92 mg/dL (ref 70–99)
Potassium: 4.1 mmol/L (ref 3.5–5.1)
Sodium: 140 mmol/L (ref 135–145)
Total Bilirubin: 0.7 mg/dL (ref 0.3–1.2)
Total Protein: 6.6 g/dL (ref 6.5–8.1)

## 2021-07-29 LAB — CBC WITH DIFFERENTIAL (CANCER CENTER ONLY)
Abs Immature Granulocytes: 0.02 10*3/uL (ref 0.00–0.07)
Basophils Absolute: 0.1 10*3/uL (ref 0.0–0.1)
Basophils Relative: 2 %
Eosinophils Absolute: 0.1 10*3/uL (ref 0.0–0.5)
Eosinophils Relative: 3 %
HCT: 34.5 % — ABNORMAL LOW (ref 36.0–46.0)
Hemoglobin: 11.2 g/dL — ABNORMAL LOW (ref 12.0–15.0)
Immature Granulocytes: 1 %
Lymphocytes Relative: 16 %
Lymphs Abs: 0.6 10*3/uL — ABNORMAL LOW (ref 0.7–4.0)
MCH: 32.7 pg (ref 26.0–34.0)
MCHC: 32.5 g/dL (ref 30.0–36.0)
MCV: 100.9 fL — ABNORMAL HIGH (ref 80.0–100.0)
Monocytes Absolute: 0.7 10*3/uL (ref 0.1–1.0)
Monocytes Relative: 20 %
Neutro Abs: 2.1 10*3/uL (ref 1.7–7.7)
Neutrophils Relative %: 58 %
Platelet Count: 149 10*3/uL — ABNORMAL LOW (ref 150–400)
RBC: 3.42 MIL/uL — ABNORMAL LOW (ref 3.87–5.11)
RDW: 16.7 % — ABNORMAL HIGH (ref 11.5–15.5)
WBC Count: 3.6 10*3/uL — ABNORMAL LOW (ref 4.0–10.5)
nRBC: 0 % (ref 0.0–0.2)

## 2021-07-29 LAB — LACTATE DEHYDROGENASE: LDH: 115 U/L (ref 98–192)

## 2021-07-29 MED ORDER — SODIUM CHLORIDE 0.9% FLUSH
10.0000 mL | Freq: Once | INTRAVENOUS | Status: AC
  Administered 2021-07-29: 10 mL via INTRAVENOUS

## 2021-07-29 MED ORDER — HEPARIN SOD (PORK) LOCK FLUSH 100 UNIT/ML IV SOLN
500.0000 [IU] | Freq: Once | INTRAVENOUS | Status: AC
  Administered 2021-07-29: 500 [IU] via INTRAVENOUS

## 2021-07-29 NOTE — Progress Notes (Signed)
Hematology and Oncology Follow Up Visit  Jacqueline Rivas [id number] 1932/04/01 87 y.o. 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 is back for a follow-up.  She is doing okay.  She says she is taking both the Ninlaro and the pomalidomide.  I suspect this probably is because her last kappa light chain went from 169 down to 95.  She seems to be doing pretty well.  She has had no real complaints.  She does have little bit of abdominal distention.  Feels like is solid there may be a little bit of gas in the intestines.  She has had no bleeding.  There is been no change in bowel or bladder habits.  She has had no rashes.  She has had no leg swelling.    She also has the breast cancer with the right breast.  Her last CA 27.29 was 14.  There is been no issues with COVID.  She is trying to exercise a little bit.  I am not sure how much she really is exercising.  Her appetite is doing pretty well.  She has had no nausea or vomiting.  Overall, her performance status is ECOG 2.      Medications:  Current Outpatient Medications:    aspirin EC 81 MG tablet, Take 162 mg by mouth daily., Disp: , Rfl:    calcium carbonate (OS-CAL) 600 MG TABS tablet, Take 1,200 mg by mouth daily with breakfast., Disp: , Rfl:    Cholecalciferol (VITAMIN D3) 2000 units TABS, daily., Disp: , Rfl:    Coenzyme Q10 (CO Q 10) 100 MG CAPS, Take 100 mg by  mouth every morning., Disp: , Rfl:    ixazomib citrate (NINLARO) 3 MG capsule, TAKE 1 CAPSULE (3 MG) BY MOUTH WEEKLY, 3 WEEKS ON, 1 WEEK OFF, REPEAT EVERY 4 WEEKS. TAKE ON AN EMPTY STOMACH 1HR BEFORE OR 2HR AFTER MEALS (Patient taking differently: TAKE 1 CAPSULE (3 MG) BY MOUTH WEEKLY, 3 WEEKS ON, 1 WEEK OFF, REPEAT EVERY 4 WEEKS. TAKE ON AN EMPTY STOMACH 1HR BEFORE OR 2HR AFTER MEALS), Disp: 3 capsule, Rfl: 5   lisinopril-hydrochlorothiazide (ZESTORETIC) 20-12.5 MG tablet, Take 1 tablet by mouth daily., Disp: , Rfl:    magnesium 30 MG tablet, Take 30 mg by mouth every morning., Disp: , Rfl:    pomalidomide (POMALYST) 3 MG capsule, Take 1 capsule (3 mg total) by mouth daily. Take for 21 days, then hold for 7 days. Repeat every 28 days. Auth# [id number], Disp: 21 capsule, Rfl: 0   Potassium 99 MG TABS, , Disp: , Rfl:    Zinc Sulfate (ZINC 15 PO), Take 1 tablet by mouth daily., Disp: , Rfl:    zolendronic acid (ZOMETA) 4 MG/5ML injection, Inject into the vein., Disp: , Rfl:  No current facility-administered  medications for this visit.  Facility-Administered Medications Ordered in Other Visits:    sodium chloride flush (NS) 0.9 % injection 10 mL, 10 mL, Intravenous, PRN, Celso Amy, NP, 10 mL at 05/11/18 1331  Allergies: No Known Allergies  Past Medical History, Surgical history, Social history, and Family History were reviewed and updated.  Review of Systems: Review of Systems  Constitutional: Negative.   HENT: Negative.    Eyes: Negative.   Respiratory: Negative.    Cardiovascular:  Positive for leg swelling.  Gastrointestinal: Negative.   Genitourinary: Negative.   Musculoskeletal:  Positive for joint pain and myalgias.  Skin: Negative.   Endo/Heme/Allergies: Negative.   Psychiatric/Behavioral: Negative.      Physical Exam:  weight is 133 lb 12 oz (60.7 kg). Her oral temperature is 98.8 F (37.1 C). Her blood pressure is 177/59 (abnormal) and her pulse is 65. Her respiration is 18  and oxygen saturation is 100%.   Physical Exam Vitals reviewed.  HENT:     Head: Normocephalic and atraumatic.  Eyes:     Pupils: Pupils are equal, round, and reactive to light.  Cardiovascular:     Rate and Rhythm: Normal rate and regular rhythm.     Heart sounds: Normal heart sounds.  Pulmonary:     Effort: Pulmonary effort is normal.     Breath sounds: Normal breath sounds.  Abdominal:     General: Bowel sounds are normal.     Palpations: Abdomen is soft.  Musculoskeletal:        General: No tenderness or deformity. Normal range of motion.     Cervical back: Normal range of motion.  Lymphadenopathy:     Cervical: No cervical adenopathy.  Skin:    General: Skin is warm and dry.     Findings: No erythema or rash.  Neurological:     Mental Status: She is alert and oriented to person, place, and time.  Psychiatric:        Behavior: Behavior normal.        Thought Content: Thought content normal.        Judgment: Judgment normal.     Lab Results  Component Value Date   WBC 3.6 (L) 07/29/2021   HGB 11.2 (L) 07/29/2021   HCT 34.5 (L) 07/29/2021   MCV 100.9 (H) 07/29/2021   PLT 149 (L) 07/29/2021     Chemistry      Component Value Date/Time   NA 139 06/13/2021 0914   NA 143 09/23/2017 1019   K 3.9 06/13/2021 0914   K 4.0 09/23/2017 1019   CL 105 06/13/2021 0914   CL 104 09/23/2017 1019   CO2 25 06/13/2021 0914   CO2 26 09/23/2017 1019   BUN 18 06/13/2021 0914   BUN 16 09/23/2017 1019   CREATININE 1.10 (H) 06/13/2021 0914   CREATININE 1.4 (H) 09/23/2017 1019      Component Value Date/Time   CALCIUM 9.9 06/13/2021 0914   CALCIUM 9.7 09/23/2017 1019   CALCIUM 11.1 (H) 11/13/2012 1526   ALKPHOS 51 06/13/2021 0914   ALKPHOS 61 09/23/2017 1019   AST 16 06/13/2021 0914   ALT 10 06/13/2021 0914   ALT 15 09/23/2017 1019   BILITOT 0.7 06/13/2021 0914     Impression and Plan: Jacqueline Rivas is 87 year old white female. She has both IgA kappa myeloma and stage II  breast cancer.   Hopefully, we will see her myeloma numbers still going back down.  Again she is taking her medications as  directed.  If we do not see that her numbers are improved, we will have to change therapy.  Given that she lives down in [place], we just may have to get her to one of the myeloma specialist at [facility].  I would hate to lose her but I think would be somewhat easier for her to be treated down there.  She will get her Zometa when we see her back.  We will plan to get her back in about 8 weeks.   [name], MD 10/11/202212:05 PM

## 2021-07-29 NOTE — Patient Instructions (Signed)
Tunneled Central Venous Catheter Flushing Guide It is important to flush your tunneled central venous catheter each time you use it, both before and after you use it. Flushing your catheter will help prevent it from clogging. What are the risks? Risks may include: Infection. Air getting into the catheter and bloodstream. Supplies needed: A clean pair of gloves. A disinfecting wipe. Use an alcohol wipe, chlorhexidine wipe, or iodine wipe as told by your health care provider. A 10 mL syringe that has been prefilled with saline solution. An empty 10 mL syringe, if a substance called heparin was injected into your catheter. How to flush your catheter When you flush your catheter, make sure you follow any specific instructions from your health care provider or the manufacturer. These are general guidelines. Flushing your catheter before use If there is heparin in your catheter: Wash your hands with soap and water. Put on gloves. Scrub the injection cap for a minimum of 15 seconds with a disinfecting wipe. Unclamp the catheter. Attach the empty syringe to the injection cap. Pull the syringe plunger back and withdraw 10 mL of blood. Place the syringe into an appropriate waste container. Scrub the injection cap for 15 seconds with a disinfecting wipe. Attach the prefilled syringe to the injection cap. Flush the catheter by pushing the plunger forward until all the liquid from the syringe is in the catheter. Remove the syringe from the injection cap. Clamp the catheter. If there is no heparin in your catheter: Wash your hands with soap and water. Put on gloves. Scrub the injection cap for 15 seconds with a disinfecting wipe. Unclamp the catheter. Attach the prefilled syringe to the injection cap. Flush the catheter by pushing the plunger forward until 5 mL of the liquid from the syringe is in the catheter. Pull back on the syringe until you see blood in the catheter. If you have been asked  to collect any blood, follow your health care provider's instructions. Otherwise, flush the catheter with the rest of the solution from the syringe. Remove the syringe from the injection cap. Clamp the catheter.  Flushing your catheter after use Wash your hands with soap and water. Put on gloves. Scrub the injection cap for 15 seconds with a disinfecting wipe. Unclamp the catheter. Attach the prefilled syringe to the injection cap. Flush the catheter by pushing the plunger forward until all of the liquid from the syringe is in the catheter. Remove the syringe from the injection cap. Clamp the catheter. Problems and solutions If blood cannot be completely cleared from the injection cap, you may need to have the injection cap replaced. If the catheter is difficult to flush, use the pulsing method. The pulsing method involves pushing only a few milliliters of solution into the catheter at a time and pausing between pushes. If you do not see blood in the catheter when you pull back on the syringe, change your body position, such as by raising your arms above your head. Take a deep breath and cough. Then, pull back on the syringe. If you still do not see blood, flush the catheter with a small amount of solution. Then, change positions again and take a breath or cough. Pull back on the syringe again. If you still do not see blood, finish flushing the catheter and contact your health care provider. Do not use your catheter until your health care provider says it is okay. General tips Have someone help you flush your catheter, if possible. Do not force fluid   through your catheter. Do not use a syringe that is larger or smaller than 10 mL. Using a smaller syringe can make the catheter burst. Do not use your catheter without flushing it first if it has heparin in it. Contact a health care provider if: You cannot see any blood in the catheter when you flush it before using it. Your catheter is difficult  to flush. Get help right away if: You cannot flush the catheter. The catheter leaks when you flush it or when there is fluid in it. There are cracks or breaks in the catheter. Summary It is important to flush your tunneled central venous catheter each time you use it, both before and after you use it. Scrub the injection cap for 15 seconds with a disinfecting wipe before and after you flush it. When you flush your catheter, make sure you follow any specific instructions from your health care provider or the manufacturer. Get help right away if you cannot flush the catheter. This information is not intended to replace advice given to you by your health care provider. Make sure you discuss any questions you have with your health care provider. Document Revised: 12/14/2019 Document Reviewed: 12/21/2018 Elsevier Patient Education  2022 [employer] Inc.  

## 2021-07-30 LAB — KAPPA/LAMBDA LIGHT CHAINS
Kappa free light chain: 110 mg/L — ABNORMAL HIGH (ref 3.3–19.4)
Kappa, lambda light chain ratio: 8.15 — ABNORMAL HIGH (ref 0.26–1.65)
Lambda free light chains: 13.5 mg/L (ref 5.7–26.3)

## 2021-07-30 LAB — IGG, IGA, IGM
IgA: 1274 mg/dL — ABNORMAL HIGH (ref 64–422)
IgG (Immunoglobin G), Serum: 501 mg/dL — ABNORMAL LOW (ref 586–1602)
IgM (Immunoglobulin M), Srm: 36 mg/dL (ref 26–217)

## 2021-07-30 LAB — CANCER ANTIGEN 27.29: CA 27.29: 16.4 U/mL (ref 0.0–38.6)

## 2021-08-04 LAB — PROTEIN ELECTROPHORESIS, SERUM, WITH REFLEX
A/G Ratio: 1.1 (ref 0.7–1.7)
Albumin ELP: 3.5 g/dL (ref 2.9–4.4)
Alpha-1-Globulin: 0.3 g/dL (ref 0.0–0.4)
Alpha-2-Globulin: 0.9 g/dL (ref 0.4–1.0)
Beta Globulin: 0.8 g/dL (ref 0.7–1.3)
Gamma Globulin: 1.2 g/dL (ref 0.4–1.8)
Globulin, Total: 3.1 g/dL (ref 2.2–3.9)
M-Spike, %: 0.8 g/dL — ABNORMAL HIGH
SPEP Interpretation: 0
Total Protein ELP: 6.6 g/dL (ref 6.0–8.5)

## 2021-08-04 LAB — IMMUNOFIXATION REFLEX, SERUM
IgA: 1338 mg/dL — ABNORMAL HIGH (ref 64–422)
IgG (Immunoglobin G), Serum: 512 mg/dL — ABNORMAL LOW (ref 586–1602)
IgM (Immunoglobulin M), Srm: 45 mg/dL (ref 26–217)

## 2021-08-06 ENCOUNTER — Other Ambulatory Visit (HOSPITAL_COMMUNITY): Payer: Self-pay

## 2021-08-13 ENCOUNTER — Other Ambulatory Visit (HOSPITAL_COMMUNITY): Payer: Self-pay

## 2021-08-28 ENCOUNTER — Other Ambulatory Visit (HOSPITAL_COMMUNITY): Payer: Self-pay

## 2021-09-04 ENCOUNTER — Other Ambulatory Visit (HOSPITAL_COMMUNITY): Payer: Self-pay

## 2021-09-08 ENCOUNTER — Other Ambulatory Visit (HOSPITAL_COMMUNITY): Payer: Self-pay

## 2021-09-12 ENCOUNTER — Other Ambulatory Visit (HOSPITAL_COMMUNITY): Payer: Self-pay

## 2021-09-16 ENCOUNTER — Other Ambulatory Visit (HOSPITAL_COMMUNITY): Payer: Self-pay

## 2021-09-23 ENCOUNTER — Other Ambulatory Visit (HOSPITAL_COMMUNITY): Payer: Self-pay

## 2021-09-30 ENCOUNTER — Inpatient Hospital Stay (HOSPITAL_BASED_OUTPATIENT_CLINIC_OR_DEPARTMENT_OTHER): Payer: Medicare PPO | Admitting: Hematology & Oncology

## 2021-09-30 ENCOUNTER — Inpatient Hospital Stay: Payer: Medicare PPO

## 2021-09-30 ENCOUNTER — Other Ambulatory Visit: Payer: Self-pay

## 2021-09-30 ENCOUNTER — Other Ambulatory Visit (HOSPITAL_COMMUNITY): Payer: Self-pay

## 2021-09-30 ENCOUNTER — Encounter: Payer: Self-pay | Admitting: Hematology & Oncology

## 2021-09-30 ENCOUNTER — Inpatient Hospital Stay: Payer: Medicare PPO | Attending: Hematology & Oncology

## 2021-09-30 VITALS — BP 168/71 | HR 80 | Temp 97.9°F | Resp 19 | Wt 131.5 lb

## 2021-09-30 VITALS — BP 156/53 | HR 69 | Temp 98.6°F | Resp 20

## 2021-09-30 DIAGNOSIS — C9002 Multiple myeloma in relapse: Secondary | ICD-10-CM | POA: Diagnosis present

## 2021-09-30 DIAGNOSIS — C50911 Malignant neoplasm of unspecified site of right female breast: Secondary | ICD-10-CM | POA: Diagnosis not present

## 2021-09-30 DIAGNOSIS — Z95828 Presence of other vascular implants and grafts: Secondary | ICD-10-CM

## 2021-09-30 DIAGNOSIS — I428 Other cardiomyopathies: Secondary | ICD-10-CM | POA: Diagnosis not present

## 2021-09-30 DIAGNOSIS — Z79899 Other long term (current) drug therapy: Secondary | ICD-10-CM | POA: Insufficient documentation

## 2021-09-30 DIAGNOSIS — Z853 Personal history of malignant neoplasm of breast: Secondary | ICD-10-CM | POA: Insufficient documentation

## 2021-09-30 DIAGNOSIS — C9000 Multiple myeloma not having achieved remission: Secondary | ICD-10-CM

## 2021-09-30 DIAGNOSIS — D630 Anemia in neoplastic disease: Secondary | ICD-10-CM | POA: Diagnosis not present

## 2021-09-30 DIAGNOSIS — C773 Secondary and unspecified malignant neoplasm of axilla and upper limb lymph nodes: Secondary | ICD-10-CM

## 2021-09-30 DIAGNOSIS — M7989 Other specified soft tissue disorders: Secondary | ICD-10-CM | POA: Insufficient documentation

## 2021-09-30 LAB — CBC WITH DIFFERENTIAL (CANCER CENTER ONLY)
Abs Immature Granulocytes: 0.02 10*3/uL (ref 0.00–0.07)
Basophils Absolute: 0.1 10*3/uL (ref 0.0–0.1)
Basophils Relative: 1 %
Eosinophils Absolute: 0 10*3/uL (ref 0.0–0.5)
Eosinophils Relative: 0 %
HCT: 37.6 % (ref 36.0–46.0)
Hemoglobin: 12.2 g/dL (ref 12.0–15.0)
Immature Granulocytes: 0 %
Lymphocytes Relative: 12 %
Lymphs Abs: 0.7 10*3/uL (ref 0.7–4.0)
MCH: 32.7 pg (ref 26.0–34.0)
MCHC: 32.4 g/dL (ref 30.0–36.0)
MCV: 100.8 fL — ABNORMAL HIGH (ref 80.0–100.0)
Monocytes Absolute: 1 10*3/uL (ref 0.1–1.0)
Monocytes Relative: 17 %
Neutro Abs: 4 10*3/uL (ref 1.7–7.7)
Neutrophils Relative %: 70 %
Platelet Count: 174 10*3/uL (ref 150–400)
RBC: 3.73 MIL/uL — ABNORMAL LOW (ref 3.87–5.11)
RDW: 15.5 % (ref 11.5–15.5)
WBC Count: 5.7 10*3/uL (ref 4.0–10.5)
nRBC: 0 % (ref 0.0–0.2)

## 2021-09-30 LAB — CMP (CANCER CENTER ONLY)
ALT: 12 U/L (ref 0–44)
AST: 19 U/L (ref 15–41)
Albumin: 3.8 g/dL (ref 3.5–5.0)
Alkaline Phosphatase: 60 U/L (ref 38–126)
Anion gap: 7 (ref 5–15)
BUN: 13 mg/dL (ref 8–23)
CO2: 27 mmol/L (ref 22–32)
Calcium: 10.2 mg/dL (ref 8.9–10.3)
Chloride: 102 mmol/L (ref 98–111)
Creatinine: 0.96 mg/dL (ref 0.44–1.00)
GFR, Estimated: 57 mL/min — ABNORMAL LOW (ref >60)
Glucose, Bld: 92 mg/dL (ref 70–99)
Potassium: 3.9 mmol/L (ref 3.5–5.1)
Sodium: 136 mmol/L (ref 135–145)
Total Bilirubin: 0.6 mg/dL (ref 0.3–1.2)
Total Protein: 6.6 g/dL (ref 6.5–8.1)

## 2021-09-30 LAB — LACTATE DEHYDROGENASE: LDH: 138 U/L (ref 98–192)

## 2021-09-30 MED ORDER — ALTEPLASE 2 MG IJ SOLR: 2.0000 mg | Freq: Once | INTRAMUSCULAR | Status: DC | PRN

## 2021-09-30 MED ORDER — ZOLEDRONIC ACID 4 MG/5ML IV CONC
3.0000 mg | Freq: Once | INTRAVENOUS | Status: AC
  Administered 2021-09-30: 3 mg via INTRAVENOUS
  Filled 2021-09-30: qty 3.75

## 2021-09-30 MED ORDER — SODIUM CHLORIDE 0.9 % IV SOLN: INTRAVENOUS | Status: DC

## 2021-09-30 MED ORDER — SODIUM CHLORIDE 0.9% FLUSH
10.0000 mL | INTRAVENOUS | Status: DC | PRN
  Administered 2021-09-30: 10 mL via INTRAVENOUS

## 2021-09-30 MED ORDER — ZOLEDRONIC ACID 4 MG/100ML IV SOLN
3.0000 mg | Freq: Once | INTRAVENOUS | Status: DC
  Filled 2021-09-30: qty 100

## 2021-09-30 MED ORDER — HEPARIN SOD (PORK) LOCK FLUSH 100 UNIT/ML IV SOLN: 500.0000 [IU] | Freq: Once | INTRAVENOUS | Status: DC

## 2021-09-30 MED ORDER — HEPARIN SOD (PORK) LOCK FLUSH 100 UNIT/ML IV SOLN
250.0000 [IU] | Freq: Once | INTRAVENOUS | Status: AC | PRN
  Administered 2021-09-30: 250 [IU]

## 2021-09-30 MED ORDER — SODIUM CHLORIDE 0.9% FLUSH
3.0000 mL | Freq: Once | INTRAVENOUS | Status: AC | PRN
  Administered 2021-09-30: 3 mL

## 2021-09-30 NOTE — Patient Instructions (Signed)

## 2021-09-30 NOTE — Progress Notes (Signed)
Hematology and Oncology Follow Up Visit  Jacqueline Rivas [id number] November 10, 1931 87 y.o. 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  Overall, I really think she is doing pretty well.  She had a very nice Thanksgiving with her family.  She lives in the [place] area.  She is taking her medications.  Sometimes, I am sure that she does not like to take any medications but I think she is much more diligent with this.  Her myeloma studies that were done back in October showed an M spike of 0.8 g/dL.  Her IgA level was 1300 mg/dL.  The Kappa light chain was 11 mg/dL.  All this was holding pretty steady.  She also has localized breast cancer.  This is never been a problem for [place].  She gets her Zometa today.  Her appetite has been pretty good.  She has had no nausea or vomiting.  She has had no change in bowel or bladder habits..  She does have some issues with foot swelling.  This might be from medications.  She says she does not take her diuretics as scheduled.  I would have to say that despite all of her issues, she really is doing quite well.  She is incredibly tough.  She is incredibly independent.  Her performance status is ECOG 2.    Medications:  Current Outpatient Medications:    aspirin EC 81 MG tablet, Take 162 mg by mouth daily., Disp: , Rfl:    calcium carbonate (OS-CAL) 600 MG TABS tablet,  Take 1,200 mg by mouth daily with breakfast., Disp: , Rfl:    Cholecalciferol (VITAMIN D3) 2000 units TABS, daily., Disp: , Rfl:    ixazomib citrate (NINLARO) 3 MG capsule, TAKE 1 CAPSULE (3 MG) BY MOUTH WEEKLY, 3 WEEKS ON, 1 WEEK OFF, REPEAT EVERY 4 WEEKS. TAKE ON AN EMPTY STOMACH 1HR BEFORE OR 2HR AFTER MEALS (Patient taking differently: TAKE 1 CAPSULE (3 MG) BY MOUTH WEEKLY, 3 WEEKS ON, 1 WEEK OFF, REPEAT EVERY 4 WEEKS. TAKE ON AN EMPTY STOMACH 1HR BEFORE OR 2HR AFTER MEALS), Disp: 3 capsule, Rfl: 5   lisinopril-hydrochlorothiazide (ZESTORETIC) 20-12.5 MG tablet, Take 1 tablet by mouth daily., Disp: , Rfl:    magnesium 30 MG tablet, Take 30 mg by mouth every morning., Disp: , Rfl:    pomalidomide (POMALYST) 3 MG capsule, Take 1 capsule (3 mg total) by mouth daily. Take for 21 days, then hold for 7 days. Repeat every 28 days. Auth# [id number], Disp: 21 capsule, Rfl: 0   Potassium 99 MG TABS, , Disp: , Rfl:    Zinc Sulfate (ZINC 15 PO), Take 1 tablet by mouth daily., Disp: , Rfl:    zolendronic acid (ZOMETA) 4 MG/5ML injection, Inject into the vein., Disp: ,  Rfl:    Coenzyme Q10 (CO Q 10) 100 MG CAPS, Take 100 mg by mouth every morning. (Patient not taking: Reported on 09/30/2021), Disp: , Rfl:  No current facility-administered medications for this visit.  Facility-Administered Medications Ordered in Other Visits:    0.9 %  sodium chloride infusion, , Intravenous, Continuous, Missi Mcmackin, Rudell Cobb, MD, Last Rate: 10 mL/hr at 09/30/21 1158, New Bag at 09/30/21 1158   alteplase (CATHFLO ACTIVASE) injection 2 mg, 2 mg, Intracatheter, Once PRN, [name], MD   heparin lock flush 100 unit/mL, 250 Units, Intracatheter, Once PRN, Marin Olp, Rudell Cobb, MD   sodium chloride flush (NS) 0.9 % injection 10 mL, 10 mL, Intravenous, PRN, Celso Amy, NP, 10 mL at 05/11/18 1331   sodium chloride flush (NS) 0.9 % injection 3 mL, 3 mL, Intracatheter, Once PRN, [name], MD   zoledronic acid (ZOMETA) 3 mg in  sodium chloride 0.9 % 100 mL IVPB, 3 mg, Intravenous, Once, [name], MD, Last Rate: 415 mL/hr at 09/30/21 1202, 3 mg at 09/30/21 1202  Allergies: No Known Allergies  Past Medical History, Surgical history, Social history, and Family History were reviewed and updated.  Review of Systems: Review of Systems  Constitutional: Negative.   HENT: Negative.    Eyes: Negative.   Respiratory: Negative.    Cardiovascular:  Positive for leg swelling.  Gastrointestinal: Negative.   Genitourinary: Negative.   Musculoskeletal:  Positive for joint pain and myalgias.  Skin: Negative.   Endo/Heme/Allergies: Negative.   Psychiatric/Behavioral: Negative.      Physical Exam:  weight is 131 lb 8 oz (59.6 kg). Her oral temperature is 97.9 F (36.6 C). Her blood pressure is 168/71 (abnormal) and her pulse is 80. Her respiration is 19 and oxygen saturation is 99%.   Physical Exam Vitals reviewed.  HENT:     Head: Normocephalic and atraumatic.  Eyes:     Pupils: Pupils are equal, round, and reactive to light.  Cardiovascular:     Rate and Rhythm: Normal rate and regular rhythm.     Heart sounds: Normal heart sounds.  Pulmonary:     Effort: Pulmonary effort is normal.     Breath sounds: Normal breath sounds.  Abdominal:     General: Bowel sounds are normal.     Palpations: Abdomen is soft.  Musculoskeletal:        General: No tenderness or deformity. Normal range of motion.     Cervical back: Normal range of motion.  Lymphadenopathy:     Cervical: No cervical adenopathy.  Skin:    General: Skin is warm and dry.     Findings: No erythema or rash.  Neurological:     Mental Status: She is alert and oriented to person, place, and time.  Psychiatric:        Behavior: Behavior normal.        Thought Content: Thought content normal.        Judgment: Judgment normal.     Lab Results  Component Value Date   WBC 5.7 09/30/2021   HGB 12.2 09/30/2021   HCT 37.6 09/30/2021   MCV 100.8  (H) 09/30/2021   PLT 174 09/30/2021     Chemistry      Component Value Date/Time   NA 136 09/30/2021 1009   NA 143 09/23/2017 1019   K 3.9 09/30/2021 1009   K 4.0 09/23/2017 1019   CL 102 09/30/2021 1009   CL 104 09/23/2017 1019   CO2  27 09/30/2021 1009   CO2 26 09/23/2017 1019   BUN 13 09/30/2021 1009   BUN 16 09/23/2017 1019   CREATININE 0.96 09/30/2021 1009   CREATININE 1.4 (H) 09/23/2017 1019      Component Value Date/Time   CALCIUM 10.2 09/30/2021 1009   CALCIUM 9.7 09/23/2017 1019   CALCIUM 11.1 (H) 11/13/2012 1526   ALKPHOS 60 09/30/2021 1009   ALKPHOS 61 09/23/2017 1019   AST 19 09/30/2021 1009   ALT 12 09/30/2021 1009   ALT 15 09/23/2017 1019   BILITOT 0.6 09/30/2021 1009     Impression and Plan: Jacqueline Rivas is 87 year old white female. She has both IgA kappa myeloma and stage II breast cancer.  I forgot to mention that she had a very nice birthday right before Thanksgiving.  We will have to see what her myeloma levels look like.  I would like to hope that everything is going be stable.  I am just glad that her quality of life seems to be managing pretty well right now.  We will plan to get her back in probably 6 weeks.  I think this would be very reasonable.   [name], MD 12/13/202212:08 PM

## 2021-09-30 NOTE — Patient Instructions (Signed)

## 2021-10-01 LAB — IGG, IGA, IGM
IgA: 1467 mg/dL — ABNORMAL HIGH (ref 64–422)
IgG (Immunoglobin G), Serum: 507 mg/dL — ABNORMAL LOW (ref 586–1602)
IgM (Immunoglobulin M), Srm: 41 mg/dL (ref 26–217)

## 2021-10-01 LAB — KAPPA/LAMBDA LIGHT CHAINS
Kappa free light chain: 466.4 mg/L — ABNORMAL HIGH (ref 3.3–19.4)
Kappa, lambda light chain ratio: 44 — ABNORMAL HIGH (ref 0.26–1.65)
Lambda free light chains: 10.6 mg/L (ref 5.7–26.3)

## 2021-10-02 ENCOUNTER — Other Ambulatory Visit: Payer: Self-pay | Admitting: *Deleted

## 2021-10-02 DIAGNOSIS — C9000 Multiple myeloma not having achieved remission: Secondary | ICD-10-CM

## 2021-10-02 LAB — CANCER ANTIGEN 27.29: CA 27.29: 15.9 U/mL (ref 0.0–38.6)

## 2021-10-02 MED ORDER — POMALIDOMIDE 3 MG PO CAPS: 3.0000 mg | ORAL_CAPSULE | Freq: Every day | ORAL | 0 refills | Status: DC

## 2021-10-06 LAB — PROTEIN ELECTROPHORESIS, SERUM, WITH REFLEX
A/G Ratio: 1.1 (ref 0.7–1.7)
Albumin ELP: 3.6 g/dL (ref 2.9–4.4)
Alpha-1-Globulin: 0.2 g/dL (ref 0.0–0.4)
Alpha-2-Globulin: 0.7 g/dL (ref 0.4–1.0)
Beta Globulin: 0.9 g/dL (ref 0.7–1.3)
Gamma Globulin: 1.4 g/dL (ref 0.4–1.8)
Globulin, Total: 3.2 g/dL (ref 2.2–3.9)
M-Spike, %: 0.9 g/dL — ABNORMAL HIGH
SPEP Interpretation: 0
Total Protein ELP: 6.8 g/dL (ref 6.0–8.5)

## 2021-10-06 LAB — IMMUNOFIXATION REFLEX, SERUM
IgA: 1436 mg/dL — ABNORMAL HIGH (ref 64–422)
IgG (Immunoglobin G), Serum: 505 mg/dL — ABNORMAL LOW (ref 586–1602)
IgM (Immunoglobulin M), Srm: 43 mg/dL (ref 26–217)

## 2021-11-12 ENCOUNTER — Encounter: Payer: Self-pay | Admitting: Hematology & Oncology

## 2021-11-19 ENCOUNTER — Encounter: Payer: Self-pay | Admitting: Hematology & Oncology

## 2021-11-19 ENCOUNTER — Inpatient Hospital Stay: Payer: Medicare (Managed Care)

## 2021-11-19 ENCOUNTER — Inpatient Hospital Stay (HOSPITAL_BASED_OUTPATIENT_CLINIC_OR_DEPARTMENT_OTHER): Payer: Medicare (Managed Care) | Admitting: Hematology & Oncology

## 2021-11-19 ENCOUNTER — Inpatient Hospital Stay: Payer: Medicare (Managed Care) | Attending: Hematology & Oncology

## 2021-11-19 ENCOUNTER — Other Ambulatory Visit (HOSPITAL_COMMUNITY): Payer: Self-pay

## 2021-11-19 ENCOUNTER — Other Ambulatory Visit: Payer: Self-pay

## 2021-11-19 VITALS — BP 160/87 | HR 80 | Temp 98.1°F | Resp 18 | Wt 130.0 lb

## 2021-11-19 DIAGNOSIS — D630 Anemia in neoplastic disease: Secondary | ICD-10-CM | POA: Diagnosis not present

## 2021-11-19 DIAGNOSIS — C9000 Multiple myeloma not having achieved remission: Secondary | ICD-10-CM | POA: Diagnosis not present

## 2021-11-19 DIAGNOSIS — C9002 Multiple myeloma in relapse: Secondary | ICD-10-CM | POA: Diagnosis not present

## 2021-11-19 DIAGNOSIS — Z79899 Other long term (current) drug therapy: Secondary | ICD-10-CM | POA: Diagnosis not present

## 2021-11-19 DIAGNOSIS — C50911 Malignant neoplasm of unspecified site of right female breast: Secondary | ICD-10-CM | POA: Insufficient documentation

## 2021-11-19 DIAGNOSIS — Z171 Estrogen receptor negative status [ER-]: Secondary | ICD-10-CM | POA: Insufficient documentation

## 2021-11-19 DIAGNOSIS — C773 Secondary and unspecified malignant neoplasm of axilla and upper limb lymph nodes: Secondary | ICD-10-CM | POA: Diagnosis not present

## 2021-11-19 DIAGNOSIS — I429 Cardiomyopathy, unspecified: Secondary | ICD-10-CM | POA: Insufficient documentation

## 2021-11-19 DIAGNOSIS — Z95828 Presence of other vascular implants and grafts: Secondary | ICD-10-CM

## 2021-11-19 DIAGNOSIS — C50912 Malignant neoplasm of unspecified site of left female breast: Secondary | ICD-10-CM | POA: Diagnosis present

## 2021-11-19 LAB — CMP (CANCER CENTER ONLY)
ALT: 11 U/L (ref 0–44)
AST: 18 U/L (ref 15–41)
Albumin: 3.9 g/dL (ref 3.5–5.0)
Alkaline Phosphatase: 53 U/L (ref 38–126)
Anion gap: 9 (ref 5–15)
BUN: 16 mg/dL (ref 8–23)
CO2: 28 mmol/L (ref 22–32)
Calcium: 10.1 mg/dL (ref 8.9–10.3)
Chloride: 102 mmol/L (ref 98–111)
Creatinine: 0.92 mg/dL (ref 0.44–1.00)
GFR, Estimated: >60 mL/min (ref >60)
Glucose, Bld: 88 mg/dL (ref 70–99)
Potassium: 4.1 mmol/L (ref 3.5–5.1)
Sodium: 139 mmol/L (ref 135–145)
Total Bilirubin: 0.7 mg/dL (ref 0.3–1.2)
Total Protein: 6.8 g/dL (ref 6.5–8.1)

## 2021-11-19 LAB — CBC WITH DIFFERENTIAL (CANCER CENTER ONLY)
Abs Immature Granulocytes: 0.01 10*3/uL (ref 0.00–0.07)
Basophils Absolute: 0 10*3/uL (ref 0.0–0.1)
Basophils Relative: 1 %
Eosinophils Absolute: 0 10*3/uL (ref 0.0–0.5)
Eosinophils Relative: 0 %
HCT: 36.1 % (ref 36.0–46.0)
Hemoglobin: 11.9 g/dL — ABNORMAL LOW (ref 12.0–15.0)
Immature Granulocytes: 0 %
Lymphocytes Relative: 17 %
Lymphs Abs: 0.7 10*3/uL (ref 0.7–4.0)
MCH: 33 pg (ref 26.0–34.0)
MCHC: 33 g/dL (ref 30.0–36.0)
MCV: 100 fL (ref 80.0–100.0)
Monocytes Absolute: 0.5 10*3/uL (ref 0.1–1.0)
Monocytes Relative: 12 %
Neutro Abs: 3 10*3/uL (ref 1.7–7.7)
Neutrophils Relative %: 70 %
Platelet Count: 149 10*3/uL — ABNORMAL LOW (ref 150–400)
RBC: 3.61 MIL/uL — ABNORMAL LOW (ref 3.87–5.11)
RDW: 14.7 % (ref 11.5–15.5)
WBC Count: 4.3 10*3/uL (ref 4.0–10.5)
nRBC: 0 % (ref 0.0–0.2)

## 2021-11-19 LAB — LACTATE DEHYDROGENASE: LDH: 134 U/L (ref 98–192)

## 2021-11-19 MED ORDER — HEPARIN SOD (PORK) LOCK FLUSH 100 UNIT/ML IV SOLN
500.0000 [IU] | Freq: Once | INTRAVENOUS | Status: AC
  Administered 2021-11-19: 500 [IU] via INTRAVENOUS

## 2021-11-19 MED ORDER — SODIUM CHLORIDE 0.9% FLUSH
10.0000 mL | Freq: Once | INTRAVENOUS | Status: AC
  Administered 2021-11-19: 10 mL via INTRAVENOUS

## 2021-11-19 NOTE — Progress Notes (Signed)
Hematology and Oncology Follow Up Visit  Jacqueline Rivas [id number] 05-29-1932 88 y.o. 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 is back for a follow-up.  Unfortunately, she not been taking her medicines for over 2 months.  She apparently changed insurance.  I told her that she could have let us know about this and we will to help take care of all this for her.  She is not taking her blood pressure medication.  This is why her ankles are probably swollen.  Only last saw her, her monoclonal spike was 0.9 g/dL.  The IgA level was 1450 mg/dL.  The Kappa light chain was up to 47 mg/dL.  It would not surprise me that her kappa light chain is higher now  She actually does not look all that bad.  Her appetite is doing okay.  She still does not do all that much activity.  She has had no problems with nausea or vomiting.  Of note, her CA 27.29 was holding steady at 16 back in December.  Currently, I would say performance status is probably ECOG 2.  Medications:  Current Outpatient Medications:    aspirin EC 81 MG tablet, Take 162 mg by mouth daily., Disp: , Rfl:    calcium carbonate (OS-CAL) 600 MG TABS tablet, Take 1,200 mg by mouth daily with breakfast., Disp: , Rfl:    Cholecalciferol (VITAMIN D3) 2000 units TABS, daily., Disp: , Rfl:    Coenzyme Q10 (CO Q 10) 100 MG CAPS, Take 100 mg by mouth every morning. (Patient not  taking: Reported on 09/30/2021), Disp: , Rfl:    ixazomib citrate (NINLARO) 3 MG capsule, TAKE 1 CAPSULE (3 MG) BY MOUTH WEEKLY, 3 WEEKS ON, 1 WEEK OFF, REPEAT EVERY 4 WEEKS. TAKE ON AN EMPTY STOMACH 1HR BEFORE OR 2HR AFTER MEALS (Patient taking differently: TAKE 1 CAPSULE (3 MG) BY MOUTH WEEKLY, 3 WEEKS ON, 1 WEEK OFF, REPEAT EVERY 4 WEEKS. TAKE ON AN EMPTY STOMACH 1HR BEFORE OR 2HR AFTER MEALS), Disp: 3 capsule, Rfl: 5   lisinopril-hydrochlorothiazide (ZESTORETIC) 20-12.5 MG tablet, Take 1 tablet by mouth daily., Disp: , Rfl:    magnesium 30 MG tablet, Take 30 mg by mouth every morning., Disp: , Rfl:    pomalidomide (POMALYST) 3 MG capsule, Take 1 capsule (3 mg total) by mouth daily. Take for 21 days, then hold for 7 days. Repeat every 28 days. Auth# [id number], Disp: 21 capsule, Rfl: 0   Potassium 99 MG TABS, , Disp: , Rfl:    Zinc Sulfate (ZINC 15 PO), Take 1 tablet by mouth daily., Disp: , Rfl:    zolendronic acid (ZOMETA) 4 MG/5ML injection, Inject into the vein., Disp: , Rfl:  No current facility-administered medications for this visit.  Facility-Administered Medications Ordered in Other Visits:  sodium chloride flush (NS) 0.9 % injection 10 mL, 10 mL, Intravenous, PRN, Jacqueline Amy, NP, 10 mL at 05/11/18 1331  Allergies: No Known Allergies  Past Medical History, Surgical history, Social history, and Family History were reviewed and updated.  Review of Systems: Review of Systems  Constitutional: Negative.   HENT: Negative.    Eyes: Negative.   Respiratory: Negative.    Cardiovascular:  Positive for leg swelling.  Gastrointestinal: Negative.   Genitourinary: Negative.   Musculoskeletal:  Positive for joint pain and myalgias.  Skin: Negative.   Endo/Heme/Allergies: Negative.   Psychiatric/Behavioral: Negative.      Physical Exam:  weight is 130 lb (59 kg). Her oral temperature is 98.1 F (36.7 C). Her blood pressure is 160/87 (abnormal) and her pulse is 80. Her respiration is  18 and oxygen saturation is 100%.   Physical Exam Vitals reviewed.  HENT:     Head: Normocephalic and atraumatic.  Eyes:     Pupils: Pupils are equal, round, and reactive to light.  Cardiovascular:     Rate and Rhythm: Normal rate and regular rhythm.     Heart sounds: Normal heart sounds.  Pulmonary:     Effort: Pulmonary effort is normal.     Breath sounds: Normal breath sounds.  Abdominal:     General: Bowel sounds are normal.     Palpations: Abdomen is soft.  Musculoskeletal:        General: No tenderness or deformity. Normal range of motion.     Cervical back: Normal range of motion.  Lymphadenopathy:     Cervical: No cervical adenopathy.  Skin:    General: Skin is warm and dry.     Findings: No erythema or rash.  Neurological:     Mental Status: She is alert and oriented to person, place, and time.  Psychiatric:        Behavior: Behavior normal.        Thought Content: Thought content normal.        Judgment: Judgment normal.     Lab Results  Component Value Date   WBC 4.3 11/19/2021   HGB 11.9 (L) 11/19/2021   HCT 36.1 11/19/2021   MCV 100.0 11/19/2021   PLT 149 (L) 11/19/2021     Chemistry      Component Value Date/Time   NA 139 11/19/2021 1022   NA 143 09/23/2017 1019   K 4.1 11/19/2021 1022   K 4.0 09/23/2017 1019   CL 102 11/19/2021 1022   CL 104 09/23/2017 1019   CO2 28 11/19/2021 1022   CO2 26 09/23/2017 1019   BUN 16 11/19/2021 1022   BUN 16 09/23/2017 1019   CREATININE 0.92 11/19/2021 1022   CREATININE 1.4 (H) 09/23/2017 1019      Component Value Date/Time   CALCIUM 10.1 11/19/2021 1022   CALCIUM 9.7 09/23/2017 1019   CALCIUM 11.1 (H) 11/13/2012 1526   ALKPHOS 53 11/19/2021 1022   ALKPHOS 61 09/23/2017 1019   AST 18 11/19/2021 1022   ALT 11 11/19/2021 1022   ALT 15 09/23/2017 1019   BILITOT 0.7 11/19/2021 1022     Impression and Plan: Jacqueline Rivas is 87 year old white female. She has both IgA kappa myeloma and stage II breast cancer.   I forgot to mention that she had a very nice birthday right before Thanksgiving.  We will have to see what her myeloma levels look like.  It would not surprise me that things are whole lot worse.  Thankfully,  her kidneys are still doing okay.  Her calcium is doing okay.  I told her that if she is not going to take her pills, we will have to give her something IV.  If that is the case, then I would just have her be seen down in [place] where she lives.  It would be a lot easier for her to be treated down there if her to come up here for treatment.  We will get her back in 1 month.  If we can have to get her back little bit sooner just because of her noncompliance.    [name], MD 2/1/202311:55 AM

## 2021-11-19 NOTE — Patient Instructions (Signed)

## 2021-11-20 LAB — IGG, IGA, IGM
IgA: 1569 mg/dL — ABNORMAL HIGH (ref 64–422)
IgG (Immunoglobin G), Serum: 433 mg/dL — ABNORMAL LOW (ref 586–1602)
IgM (Immunoglobulin M), Srm: 35 mg/dL (ref 26–217)

## 2021-11-20 LAB — KAPPA/LAMBDA LIGHT CHAINS
Kappa free light chain: 559.5 mg/L — ABNORMAL HIGH (ref 3.3–19.4)
Kappa, lambda light chain ratio: 46.63 — ABNORMAL HIGH (ref 0.26–1.65)
Lambda free light chains: 12 mg/L (ref 5.7–26.3)

## 2021-11-20 LAB — CANCER ANTIGEN 27.29: CA 27.29: 12.1 U/mL (ref 0.0–38.6)

## 2021-11-25 ENCOUNTER — Other Ambulatory Visit: Payer: Self-pay

## 2021-11-25 DIAGNOSIS — C9000 Multiple myeloma not having achieved remission: Secondary | ICD-10-CM

## 2021-11-25 LAB — IMMUNOFIXATION REFLEX, SERUM
IgA: 1603 mg/dL — ABNORMAL HIGH (ref 64–422)
IgG (Immunoglobin G), Serum: 466 mg/dL — ABNORMAL LOW (ref 586–1602)
IgM (Immunoglobulin M), Srm: 33 mg/dL (ref 26–217)

## 2021-11-25 LAB — PROTEIN ELECTROPHORESIS, SERUM, WITH REFLEX
A/G Ratio: 1 (ref 0.7–1.7)
Albumin ELP: 3.4 g/dL (ref 2.9–4.4)
Alpha-1-Globulin: 0.3 g/dL (ref 0.0–0.4)
Alpha-2-Globulin: 0.7 g/dL (ref 0.4–1.0)
Beta Globulin: 0.9 g/dL (ref 0.7–1.3)
Gamma Globulin: 1.5 g/dL (ref 0.4–1.8)
Globulin, Total: 3.5 g/dL (ref 2.2–3.9)
M-Spike, %: 1 g/dL — ABNORMAL HIGH
SPEP Interpretation: 0
Total Protein ELP: 6.9 g/dL (ref 6.0–8.5)

## 2021-11-25 MED ORDER — POMALIDOMIDE 3 MG PO CAPS: 3.0000 mg | ORAL_CAPSULE | Freq: Every day | ORAL | 0 refills | Status: DC

## 2021-11-27 ENCOUNTER — Other Ambulatory Visit (HOSPITAL_COMMUNITY): Payer: Self-pay

## 2021-12-11 ENCOUNTER — Other Ambulatory Visit (HOSPITAL_COMMUNITY): Payer: Self-pay

## 2021-12-11 ENCOUNTER — Encounter: Payer: Self-pay | Admitting: Hematology & Oncology

## 2021-12-23 ENCOUNTER — Ambulatory Visit: Payer: PRIVATE HEALTH INSURANCE

## 2021-12-23 ENCOUNTER — Inpatient Hospital Stay: Payer: Medicare (Managed Care)

## 2021-12-23 ENCOUNTER — Ambulatory Visit: Payer: PRIVATE HEALTH INSURANCE | Admitting: Hematology & Oncology

## 2021-12-23 ENCOUNTER — Other Ambulatory Visit: Payer: PRIVATE HEALTH INSURANCE

## 2021-12-30 ENCOUNTER — Other Ambulatory Visit (HOSPITAL_COMMUNITY): Payer: Self-pay

## 2022-01-08 ENCOUNTER — Other Ambulatory Visit (HOSPITAL_COMMUNITY): Payer: Self-pay

## 2022-01-13 ENCOUNTER — Other Ambulatory Visit (HOSPITAL_COMMUNITY): Payer: Self-pay

## 2022-02-06 ENCOUNTER — Encounter: Payer: Self-pay | Admitting: Hematology & Oncology

## 2022-02-06 NOTE — Telephone Encounter (Signed)
Na

## 2022-09-18 DEATH — deceased

## 2022-09-21 ENCOUNTER — Telehealth: Payer: Self-pay | Admitting: *Deleted

## 2022-09-21 NOTE — Telephone Encounter (Signed)
Dr Marin Olp notified by patients son that patient passed away last week in [place].  Flowers sent by Dr Marin Olp
